# Patient Record
Sex: Male | Born: 1935 | Race: White | Hispanic: No | Marital: Married | Smoking: Never smoker
Health system: Southern US, Community
[De-identification: ages and names within clinical notes are randomized; demographics above are authoritative.]

## PROBLEM LIST (undated history)

## (undated) DIAGNOSIS — I10 Essential (primary) hypertension: Secondary | ICD-10-CM

## (undated) DIAGNOSIS — H3320 Serous retinal detachment, unspecified eye: Secondary | ICD-10-CM

## (undated) DIAGNOSIS — N200 Calculus of kidney: Secondary | ICD-10-CM

## (undated) DIAGNOSIS — G459 Transient cerebral ischemic attack, unspecified: Secondary | ICD-10-CM

## (undated) DIAGNOSIS — I4891 Unspecified atrial fibrillation: Secondary | ICD-10-CM

## (undated) DIAGNOSIS — I340 Nonrheumatic mitral (valve) insufficiency: Secondary | ICD-10-CM

## (undated) DIAGNOSIS — K219 Gastro-esophageal reflux disease without esophagitis: Secondary | ICD-10-CM

## (undated) DIAGNOSIS — E739 Lactose intolerance, unspecified: Secondary | ICD-10-CM

## (undated) DIAGNOSIS — K5790 Diverticulosis of intestine, part unspecified, without perforation or abscess without bleeding: Secondary | ICD-10-CM

## (undated) DIAGNOSIS — M4644 Discitis, unspecified, thoracic region: Secondary | ICD-10-CM

## (undated) HISTORY — DX: Gastro-esophageal reflux disease without esophagitis: K21.9

## (undated) HISTORY — DX: Nonrheumatic mitral (valve) insufficiency: I34.0

## (undated) HISTORY — DX: Calculus of kidney: N20.0

## (undated) HISTORY — PX: ESOPHAGEAL DILATION: SHX303

## (undated) HISTORY — PX: OTHER SURGICAL HISTORY: SHX169

## (undated) HISTORY — DX: Serous retinal detachment, unspecified eye: H33.20

## (undated) HISTORY — PX: INGUINAL HERNIA REPAIR: SHX194

## (undated) HISTORY — PX: KIDNEY STONE SURGERY: SHX686

## (undated) HISTORY — PX: TONSILLECTOMY: SHX5217

## (undated) HISTORY — PX: FINGER TENDON REPAIR: SHX1640

## (undated) HISTORY — DX: Unspecified atrial fibrillation: I48.91

## (undated) HISTORY — DX: Diverticulosis of intestine, part unspecified, without perforation or abscess without bleeding: K57.90

## (undated) HISTORY — DX: Gilbert syndrome: E80.4

## (undated) HISTORY — DX: Essential (primary) hypertension: I10

---
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
## 1998-11-27 ENCOUNTER — Emergency Department (HOSPITAL_COMMUNITY): Admission: EM | Admit: 1998-11-27 | Discharge: 1998-11-27 | Payer: Self-pay | Admitting: Emergency Medicine

## 1998-11-27 ENCOUNTER — Encounter: Payer: Self-pay | Admitting: Emergency Medicine

## 2000-03-08 ENCOUNTER — Encounter: Payer: Self-pay | Admitting: Surgery

## 2000-03-10 ENCOUNTER — Observation Stay (HOSPITAL_COMMUNITY): Admission: RE | Admit: 2000-03-10 | Discharge: 2000-03-11 | Payer: Self-pay | Admitting: Surgery

## 2000-06-26 ENCOUNTER — Ambulatory Visit (HOSPITAL_COMMUNITY): Admission: RE | Admit: 2000-06-26 | Discharge: 2000-06-27 | Payer: Self-pay | Admitting: Ophthalmology

## 2001-06-16 ENCOUNTER — Emergency Department (HOSPITAL_COMMUNITY): Admission: EM | Admit: 2001-06-16 | Discharge: 2001-06-16 | Payer: Self-pay | Admitting: Emergency Medicine

## 2001-06-16 ENCOUNTER — Encounter: Payer: Self-pay | Admitting: Emergency Medicine

## 2002-06-04 ENCOUNTER — Encounter: Admission: RE | Admit: 2002-06-04 | Discharge: 2002-06-04 | Payer: Self-pay | Admitting: Internal Medicine

## 2002-06-04 ENCOUNTER — Encounter: Payer: Self-pay | Admitting: Internal Medicine

## 2003-01-03 ENCOUNTER — Encounter: Payer: Self-pay | Admitting: Gastroenterology

## 2005-01-18 ENCOUNTER — Ambulatory Visit: Payer: Self-pay | Admitting: Gastroenterology

## 2005-02-10 ENCOUNTER — Ambulatory Visit: Payer: Self-pay | Admitting: Gastroenterology

## 2005-02-10 ENCOUNTER — Encounter (INDEPENDENT_AMBULATORY_CARE_PROVIDER_SITE_OTHER): Payer: Self-pay | Admitting: *Deleted

## 2005-05-27 ENCOUNTER — Ambulatory Visit (HOSPITAL_COMMUNITY): Admission: RE | Admit: 2005-05-27 | Discharge: 2005-05-28 | Payer: Self-pay | Admitting: Surgery

## 2009-01-05 ENCOUNTER — Encounter: Admission: RE | Admit: 2009-01-05 | Discharge: 2009-01-05 | Payer: Self-pay | Admitting: Internal Medicine

## 2009-02-25 DIAGNOSIS — J45909 Unspecified asthma, uncomplicated: Secondary | ICD-10-CM | POA: Insufficient documentation

## 2009-02-25 DIAGNOSIS — M5410 Radiculopathy, site unspecified: Secondary | ICD-10-CM | POA: Insufficient documentation

## 2009-02-25 DIAGNOSIS — N529 Male erectile dysfunction, unspecified: Secondary | ICD-10-CM | POA: Insufficient documentation

## 2009-03-02 ENCOUNTER — Encounter: Admission: RE | Admit: 2009-03-02 | Discharge: 2009-03-02 | Payer: Self-pay | Admitting: Orthopedic Surgery

## 2009-03-04 ENCOUNTER — Ambulatory Visit (HOSPITAL_BASED_OUTPATIENT_CLINIC_OR_DEPARTMENT_OTHER): Admission: RE | Admit: 2009-03-04 | Discharge: 2009-03-04 | Payer: Self-pay | Admitting: Orthopedic Surgery

## 2010-02-16 ENCOUNTER — Encounter: Payer: Self-pay | Admitting: Gastroenterology

## 2010-03-15 DIAGNOSIS — R001 Bradycardia, unspecified: Secondary | ICD-10-CM | POA: Insufficient documentation

## 2010-04-06 ENCOUNTER — Ambulatory Visit: Payer: Self-pay | Admitting: Cardiology

## 2010-04-08 NOTE — Letter (Signed)
Summary: Colonoscopy Letter  [facility]  [address]   [place], [place] [address]   Phone: [phone]  Fax: [phone]      February 16, 2010 MRN: [id number]   [facility] [address] [place], [place]  [address]   Dear Cody Mendoza,   According to your medical record, it is time for you to schedule a Colonoscopy. The American Cancer Society recommends this procedure as a method to detect early colon cancer. Patients with a family history of colon cancer, or a personal history of colon polyps or inflammatory bowel disease are at increased risk.  This letter has been generated based on the recommendations made at the time of your procedure. If you feel that in your particular situation this may no longer apply, please contact our office.  Please call our office at [phone] to schedule this appointment or to update your records at your earliest convenience.  Thank you for cooperating with [place] to provide you with the very best care possible.   Sincerely,   Claudette Head, M.D.  [facility] [phone]

## 2010-04-16 ENCOUNTER — Ambulatory Visit (INDEPENDENT_AMBULATORY_CARE_PROVIDER_SITE_OTHER): Payer: BC Managed Care – PPO | Admitting: *Deleted

## 2010-04-16 DIAGNOSIS — I498 Other specified cardiac arrhythmias: Secondary | ICD-10-CM

## 2010-04-16 DIAGNOSIS — I10 Essential (primary) hypertension: Secondary | ICD-10-CM

## 2010-04-22 ENCOUNTER — Encounter: Payer: Self-pay | Admitting: Gastroenterology

## 2010-04-22 ENCOUNTER — Ambulatory Visit (INDEPENDENT_AMBULATORY_CARE_PROVIDER_SITE_OTHER): Payer: BC Managed Care – PPO | Admitting: Gastroenterology

## 2010-04-22 DIAGNOSIS — Z01810 Encounter for preprocedural cardiovascular examination: Secondary | ICD-10-CM

## 2010-04-22 DIAGNOSIS — K219 Gastro-esophageal reflux disease without esophagitis: Secondary | ICD-10-CM

## 2010-04-22 DIAGNOSIS — Z860100 Personal history of colon polyps, unspecified: Secondary | ICD-10-CM | POA: Insufficient documentation

## 2010-04-22 NOTE — Assessment & Plan Note (Signed)
Summary: Gastroenterology  Cody Mendoza MR#:  [id number] Page #  NAME:  Cody Mendoza, Cody Mendoza  OFFICE NO:  [id number]  DATE:  01/18/05  DOB:  October 05, 2021  HISTORY OF PRESENT ILLNESS:  The patient is a very nice 77 year old white male who I follow for gastroesophageal reflux disease with a history of a peptic stricture.  Upper endoscopy with [name] dilation in October 2004.  He is maintained on Protonix, and his reflux symptoms are under good control.  He has no dysphagia.  He last had colorectal cancer screening with a flexible sigmoidoscopy about 10 years ago, and he was told of internal hemorrhoids at that time.  There is no family history of colon cancer or colon polyps.  He denies any abdominal pain, change in bowel habits, constipation, diarrhea, melena, hematochezia, or change in stool caliber.  His appetite is good, and his weight is stable.  His reflux symptoms are under very good control.    CURRENT MEDICATIONS:  Listed on the chart, updated and reviewed.  MEDICATION ALLERGIES:  None known.   PHYSICAL EXAMINATION:  No acute distress.  Weight 149.2 pounds.  Blood pressure is 154/76.  Pulse 68 and regular.  HEENT exam:  Anicteric sclerae; oropharynx clear.  Chest:  Clear to auscultation bilaterally.  Cardiac:  Regular rate and rhythm without murmurs.  Abdomen:  Soft, nontender and nondistended with normoactive bowel sounds; no palpable organomegaly, masses or hernias.  Rectal examination deferred to time of colonoscopy.  Extremities without clubbing, cyanosis, or edema.  Neurologic:  Alert and oriented x3, grossly nonfocal.    ASSESSMENT AND PLAN:   1.  Average risk for colorectal cancer.  Risks, benefits, and alternatives to colonoscopy with possible biopsy and possible polypectomy discussed with the patient.  He consents to proceed.  This will be scheduled electively.  2.  Gastroesophageal reflux disease with a history of peptic stricture.  Maintain all standard antireflux measures and a proton  pump inhibitor on a daily basis.  He will remain on Prilosec 40 mg p.o. q.a.m. for now.       Venita Lick. Russella Dar, M.D., F.A.C.G.  [id number] cc:  Rodrigo Ran, MD  D:  01/23/05; T:  ; Job [phone]

## 2010-04-22 NOTE — Procedures (Signed)
Summary: EGD   EGD  Procedure date:  01/03/2003  Findings:      Findings: Stricture:  Location: [facility]   Patient Name: Cody Mendoza, Cody Mendoza MRN:  Procedure Procedures: Panendoscopy (EGD) CPT: 43235.    with biopsy(s)/brushing(s). CPT: D1846139.    with [facility] dilation [id number] Personnel: Endoscopist: Venita Lick. Russella Dar, MD, Clementeen Graham.  Referred By: Rodrigo Ran, MD.  Exam Location: Exam performed in Outpatient Clinic. Outpatient  Patient Consent: Procedure, Alternatives, Risks and Benefits discussed, consent obtained, from patient. Consent was obtained by the RN.  Indications Symptoms: Dysphagia. Reflux symptoms  History  Current Medications: Patient is not currently taking Coumadin.  Pre-Exam Physical: Performed Jan 03, 2003  Entire physical exam was normal.  Exam Exam Info: Maximum depth of insertion Duodenum, intended Duodenum. Vocal cords not visualized. Gastric retroflexion performed. ASA Classification: II. Tolerance: excellent.  Sedation Meds: Patient assessed and found to be appropriate for moderate (conscious) sedation. Fentanyl 50 mcg. given IV. Versed 5 mg. given IV. Cetacaine Spray 2 sprays given aerosolized.  Monitoring: BP and pulse monitoring done. Oximetry used. Supplemental O2 given  Findings Normal: Proximal Esophagus to Mid Esophagus.  STRICTURE / STENOSIS: Stricture in Distal Esophagus.  Constriction: partial. Lumen diameter is 14 mm. Biopsy of Stricture/Steno  taken. ICD9: Esophageal Stricture: 530.3.  - Dilation: Distal Esophagus. Maloney dilator used, Diameter: 15 mm, Minimal Resistance, No Heme present on extraction. Maloney dilator used, Diameter: 16 mm, Minimal Resistance, No Heme present on extraction. Patient tolerance excellent. Outcome: successful.  Normal: Cardia to Duodenal 2nd Portion.   Assessment  Diagnoses: 530.3: Esophageal Stricture.   Events  Unplanned Intervention: No unplanned interventions were  required.  Unplanned Events: There were no complications. Plans Instructions: Nothing to eat or drink for 1 hour.  Clear or full liquids: 2 hours.  Medication(s): Await pathology. Continue current medications. PPI: Pantoprazole/Protonix 40 mg QAM, for indefinitely.   Patient Education: Patient given standard instructions for: Reflux. Stenosis / Stricture.  Disposition: After procedure patient sent to recovery. After recovery patient sent home.  Scheduling: Office Visit, to [employer]. Russella Dar, MD, [facility], around Jan 03, 2004.  Primary Care Provider, to Rodrigo Ran, MD,    This report was created from the original endoscopy report, which was reviewed and signed by the above listed endoscopist.    cc: Rodrigo Ran, MD

## 2010-04-22 NOTE — Procedures (Signed)
Summary: Colonoscopy and biopsy   Colonoscopy  Procedure date:  02/10/2005  Findings:      Results: Diverticulosis.       Pathology:  Adenomatous polyp.        Location:  [facility].    Procedures Next Due Date:    Colonoscopy: 02/2010 Patient Name: Cody, Mendoza MRN:  Procedure Procedures: Colonoscopy CPT: [id number].    with Hot Biopsy(s)CPT: Z451292.  Personnel: Endoscopist: Venita Lick. Russella Dar, MD, Clementeen Graham.  Exam Location: Exam performed in Outpatient Clinic. Outpatient  Patient Consent: Procedure, Alternatives, Risks and Benefits discussed, consent obtained, from patient. Consent was obtained by the RN.  Indications  Average Risk Screening Routine.  History  Current Medications: Patient is not currently taking Coumadin.  Pre-Exam Physical: Performed Feb 10, 2005. Cardio-pulmonary exam, Rectal exam, HEENT exam , Abdominal exam, Mental status exam WNL.  Exam Exam: Extent of exam reached: Cecum, extent intended: Cecum.  The cecum was identified by appendiceal orifice and IC valve. Colon retroflexion performed. ASA Classification: II. Tolerance: excellent.  Monitoring: Pulse and BP monitoring, Oximetry used. Supplemental O2 given.  Colon Prep Used MoviPrep for colon prep. Prep results: excellent.  Sedation Meds: Patient assessed and found to be appropriate for moderate (conscious) sedation. Fentanyl 50 mcg. given IV. Versed 5 mg. given IV.  Findings NORMAL EXAM: Ascending Colon to Splenic Flexure.  POLYP: Descending Colon, Maximum size: 3 mm. sessile polyp. Procedure:  hot biopsy, removed, retrieved, Polyp sent to pathology. ICD9: Colon Polyps: 211.3.  POLYP: Cecum, Maximum size: 3 mm. sessile polyp. Procedure:  hot biopsy, removed, retrieved, sent to pathology. ICD9: Colon Polyps: 211.3.  DIVERTICULOSIS: Sigmoid Colon. Not bleeding. ICD9: Diverticulosis: 562.10. Comments: mild.  NORMAL EXAM: Rectum.   Assessment  Diagnoses: 211.3: Colon  Polyps.  562.10: Diverticulosis.   Events  Unplanned Interventions: No intervention was required.  Unplanned Events: There were no complications. Plans  Post Exam Instructions: Post sedation instructions given. No aspirin or non-steroidal containing medications: 2 weeks.  Medication Plan: Await pathology.  Patient Education: Patient given standard instructions for: Polyps. Diverticulosis.  Disposition: After procedure patient sent to recovery. After recovery patient sent home.  Scheduling/Referral: Primary Care Provider, to Rodrigo Ran, MD, as planned,  Colonoscopy, to [facility] T. Russella Dar, MD, [facility], if polyp(s) adenomatous and health status appropriate, around Feb 10, 2010.    This report was created from the original endoscopy report, which was reviewed and signed by the above listed endoscopist.    cc: Rodrigo Ran, MD      SP Surgical Pathology - STATUS: Final             By: Gwenlyn Saran  ,        Perform Date: 7 Dec06 00:01  Ordered By: Rica Records Date: 8 Dec06 16:33  Facility: [facility]                               Department: CPATH  Service Report Text  [facility]   [address] [address]   [place], [place] [address]   Telephone [phone] or [phone] Fax [phone]    REPORT OF SURGICAL PATHOLOGY    Case #: [id number]   Patient Name: Cody, Mendoza   PID: [id number]   Pathologist: Berneta Levins, MD   DOB/Age 03-Aug-1921 (Age: 77) Gender: M   Date Taken: 02/10/2005  Date Received: 02/11/2005    FINAL DIAGNOSIS    ***MICROSCOPIC EXAMINATION AND DIAGNOSIS***    COLON, POLYP(S): TWO ADENOMATOUS POLYPS AND ONE POLYPOID   FRAGMENT OF COLONIC MUCOSA. NO HIGH GRADE DYSPLASIA OR INVASIVE   MALIGNANCY IDENTIFIED. (BIOPSIES, CECUM AND DESCENDING)    COMMENT   There is adenoma with nuclear stratification and decreased mucin.   High grade dysplasia or invasive malignancy are not identified.   The  findings are consistent with an adenomatous polyp(s) if the   biopsy(ies) is (are) representative of the entire lesion(s).   These features are present in two fragments of tissue. (JAS:caf   02/14/05)    cf   Date Reported: 02/14/2005 Berneta Levins, MD   *** Electronically Signed Out By JAS ***    Clinical information   R/O adenoma (kv)    specimen(s) obtained   Colon, polyp(s), cecal, descending    Gross Description   Received in formalin are tan, soft tissue fragments that are   submitted in toto. Number: 3   Size: 0.2, 0.3 and 0.4 cm toto in cassette.   (BM:jes, 02/14/05)    jes/

## 2010-04-28 ENCOUNTER — Other Ambulatory Visit: Payer: Self-pay | Admitting: Gastroenterology

## 2010-04-28 ENCOUNTER — Other Ambulatory Visit (AMBULATORY_SURGERY_CENTER): Payer: BC Managed Care – PPO | Admitting: Gastroenterology

## 2010-04-28 DIAGNOSIS — Z1211 Encounter for screening for malignant neoplasm of colon: Secondary | ICD-10-CM

## 2010-04-28 DIAGNOSIS — D126 Benign neoplasm of colon, unspecified: Secondary | ICD-10-CM

## 2010-04-28 DIAGNOSIS — Z86010 Personal history of colonic polyps: Secondary | ICD-10-CM

## 2010-04-28 DIAGNOSIS — K5730 Diverticulosis of large intestine without perforation or abscess without bleeding: Secondary | ICD-10-CM

## 2010-04-28 NOTE — Assessment & Plan Note (Signed)
Summary: CONSULT FOR COLON--[facility] WITH PT MEDICARE CX POLICY ADVISED   History of Present Illness Visit Type: Initial Visit Primary GI MD: Elie Goody MD [facility] Primary Provider: Emiliano Dyer Requesting Provider: n/a Chief Complaint: Consult for colonoscopy, Pt is not having any GI complaints History of Present Illness:   This is an 77 year old male with a history of GERD complicated by a peptic stricture and adenomatous colon polyps. His reflux symptoms are under very good control on Protonix. His health status is good. He had a recent complete physical examination by Dr. Waynard Edwards. He has stable hypertension, GERD and a history of kidney stones.   GI Review of Systems      Denies abdominal pain, acid reflux, belching, bloating, chest pain, dysphagia with liquids, dysphagia with solids, heartburn, loss of appetite, nausea, vomiting, vomiting blood, weight loss, and  weight gain.        Denies anal fissure, black tarry stools, change in bowel habit, constipation, diarrhea, diverticulosis, fecal incontinence, heme positive stool, hemorrhoids, irritable bowel syndrome, jaundice, light color stool, liver problems, rectal bleeding, and  rectal pain. Preventive Screening-Counseling & Management  Alcohol-Tobacco     Smoking Status: never      Drug Use:  yes.    Current Medications (verified): 1)  Bystolic 10 Mg Tabs (Nebivolol Hcl) .Marland Kitchen.. 1 By Mouth Once Daily 2)  Hydrochlorothiazide 25 Mg Tabs (Hydrochlorothiazide) .Marland Kitchen.. 1 By Mouth Once Daily 3)  Aspirin 81 Mg Tbec (Aspirin) .Marland Kitchen.. 1 By Mouth Once Daily 4)  Vitamin C 500 Mg Tabs (Ascorbic Acid) .Marland Kitchen.. 1 By Mouth Once Daily 5)  Vitamin D 1000 Unit Tabs (Cholecalciferol) .Marland Kitchen.. 1 By Mouth Once Daily 6)  Multivitamins  Tabs (Multiple Vitamin) .Marland Kitchen.. 1 By Mouth Once Daily 7)  Protonix 40 Mg Tbec (Pantoprazole Sodium) .Marland Kitchen.. 1 By Mouth Once Daily 8)  Doxazosin Mesylate 4 Mg Tabs (Doxazosin Mesylate) .Marland Kitchen.. 1 By Mouth Once Daily 9)  Sea-Omega 30 1200  Mg Caps (Omega-3 Fatty Acids) .... 2 By Mouth Once Daily 10)  Vitamin B-12 250 Mcg Tabs (Cyanocobalamin) .Marland Kitchen.. 1 By Mouth Once Daily 11)  Viagra 100 Mg Tabs (Sildenafil Citrate) .... As Needed  Allergies (verified): No Known Drug Allergies  Past History:  Past Medical History: Last updated: 04/16/2010 GERD Peptic Stricture Hemorrhoids Hypertension Kidney Stones Diverticulosis Adenomatous Colon Polyps 02/2005  Past Surgical History: Last updated: 04/16/2010 Inguinal hernia repair T & A Repair of torn retina 2002  Family History: No FH of Colon Cancer: Multilpe Myoloma- Mother Family History of Breast Cancer: sister Family History of Prostate Cancer: father lung cancer-brother  Social History: Married Alcohol Use - yes Occupation: retired Patient has never smoked.  Alcohol Use - no Illicit Drug Use - yes Smoking Status:  never Drug Use:  yes  Review of Systems       The patient complains of hearing problems.         The pertinent positives and negatives are noted as above and in the HPI. All other ROS were reviewed and were negative.   Vital Signs:  Patient profile:   77 year old male Height:      67 inches Weight:      156 pounds BMI:     24.52 BSA:     1.82 Pulse rate:   48 / minute Pulse rhythm:   regular BP sitting:   152 / 68  (left arm)  Vitals Entered By: Merri Ray CMA Duncan Dull) (April 22, 2010 10:43 AM)  Physical Exam  General:  Well developed, well nourished, no acute distress. Head:  Normocephalic and atraumatic. Eyes:  PERRLA, no icterus. Ears:  decreased auditory acuity.   Mouth:  No deformity or lesions, dentition normal. Lungs:  Clear throughout to auscultation. Heart:  Regular rate and rhythm; no murmurs, rubs,  or bruits. Abdomen:  Soft, nontender and nondistended. No masses, hepatosplenomegaly or hernias noted. Normal bowel sounds. Rectal:  deferred until time of colonoscopy.   Extremities:  No clubbing, cyanosis, edema or  deformities noted. Neurologic:  Alert and  oriented x4;  grossly normal neurologically. Cervical Nodes:  No significant cervical adenopathy. Inguinal Nodes:  No significant inguinal adenopathy. Psych:  Alert and cooperative. Normal mood and affect.  Impression & Recommendations:  Problem # 1:  PERSONAL HX COLONIC POLYPS (ICD-V12.72) Prior history of adenomatous colon polyps. His health status is good and he would like to proceed with colonoscopy, which I feel is reasonable. The risks, benefits and alternatives to colonoscopy with possible biopsy and possible polypectomy were discussed with the patient and they consent to proceed. The procedure will be scheduled electively. Orders: Colonoscopy (Colon)  Problem # 2:  GERD (ICD-530.81) Continue Protonix 40 mg q.a.m., along with standard antireflux measures.  Patient Instructions: 1)  Pick up your prep from your pharmacy.  2)  Colonoscopy brochure given.  3)  Copy sent to : Loraine Leriche Perini,MD 4)  The medication list was reviewed and reconciled.  All changed / newly prescribed medications were explained.  A complete medication list was provided to the patient / caregiver.  Prescriptions: MOVIPREP 100 GM  SOLR (PEG-KCL-NACL-NASULF-NA ASC-C) As per prep instructions.  #1 x 0   Entered by:   Christie Nottingham CMA (AAMA)   Authorized by:   Meryl Dare MD [facility]   Signed by:   Christie Nottingham CMA (AAMA) on 04/22/2010   Method used:   Electronically to        [facility]. [phone]* (retail)       [address]       [place], [place]  [address]       Ph: [phone]       Fax: [phone]   RxID:   [id number]

## 2010-04-28 NOTE — Letter (Signed)
Summary: [facility] Instructions  [facility]  [address] [place], [place] [address]   Phone: [phone]  Fax: [phone]       Cody Mendoza    [birth year]    MRN: [id number]        Procedure Day Dorna Bloom: Wednesday February 22nd, 2012     Arrival Time: 12:30pm     Procedure Time: 1:30pm     Location of Procedure:                    _ x_  [facility] (4th Floor)                        PREPARATION FOR COLONOSCOPY WITH MOVIPREP   Starting 5 days prior to your procedure 04/23/10 do not eat nuts, seeds, popcorn, corn, beans, peas,  salads, or any raw vegetables.  Do not take any fiber supplements (e.g. Metamucil, Citrucel, and Benefiber).  THE DAY BEFORE YOUR PROCEDURE         DATE: 04/27/10  DAY: Tuesday  1.  Drink clear liquids the entire day-NO SOLID FOOD  2.  Do not drink anything colored red or purple.  Avoid juices with pulp.  No orange juice.  3.  Drink at least 64 oz. (8 glasses) of fluid/clear liquids during the day to prevent dehydration and help the prep work efficiently.  CLEAR LIQUIDS INCLUDE: Water Jello Ice Popsicles Tea (sugar ok, no milk/cream) Powdered fruit flavored drinks Coffee (sugar ok, no milk/cream) Gatorade Juice: apple, white grape, white cranberry  Lemonade Clear bullion, consomm, broth Carbonated beverages (any kind) Strained chicken noodle soup Hard Candy                             4.  In the morning, mix first dose of MoviPrep solution:    Empty 1 Pouch A and 1 Pouch B into the disposable container    Add lukewarm drinking water to the top line of the container. Mix to dissolve    Refrigerate (mixed solution should be used within 24 hrs)  5.  Begin drinking the prep at 5:00 p.m. The MoviPrep container is divided by 4 marks.   Every 15 minutes drink the solution down to the next mark (approximately 8 oz) until the full liter is complete.   6.  Follow completed prep with 16 oz of clear liquid of your  choice (Nothing red or purple).  Continue to drink clear liquids until bedtime.  7.  Before going to bed, mix second dose of MoviPrep solution:    Empty 1 Pouch A and 1 Pouch B into the disposable container    Add lukewarm drinking water to the top line of the container. Mix to dissolve    Refrigerate  THE DAY OF YOUR PROCEDURE      DATE:04/28/10 DAY: Wednesday  Beginning at 8:30 a.m. (5 hours before procedure):         1. Every 15 minutes, drink the solution down to the next mark (approx 8 oz) until the full liter is complete.  2. Follow completed prep with 16 oz. of clear liquid of your choice.    3. You may drink clear liquids until 11:30am (2 HOURS BEFORE PROCEDURE).   MEDICATION INSTRUCTIONS  Unless otherwise instructed, you should take regular prescription medications with a small sip of water   as early as possible the morning of your procedure.  OTHER INSTRUCTIONS  You will need a responsible adult at least 75 years of age to accompany you and drive you home.   This person must remain in the waiting room during your procedure.  Wear loose fitting clothing that is easily removed.  Leave jewelry and other valuables at home.  However, you may wish to bring a book to read or  an iPod/MP3 player to listen to music as you wait for your procedure to start.  Remove all body piercing jewelry and leave at home.  Total time from sign-in until discharge is approximately 2-3 hours.  You should go home directly after your procedure and rest.  You can resume normal activities the  day after your procedure.  The day of your procedure you should not:   Drive   Make legal decisions   Operate machinery   Drink alcohol   Return to work  You will receive specific instructions about eating, activities and medications before you leave.    The above instructions have been reviewed and explained to me by   Estill Bamberg.     I fully understand and can verbalize these  instructions _____________________________ Date _________

## 2010-05-03 ENCOUNTER — Encounter: Payer: Self-pay | Admitting: Gastroenterology

## 2010-05-04 NOTE — Procedures (Addendum)
Summary: Colonoscopy  Patient: Cody Mendoza Note: All result statuses are Final unless otherwise noted.  Tests: (1) Colonoscopy (COL)   COL Colonoscopy           DONE     [facility]     [address]     [place], [place]  [address]           COLONOSCOPY PROCEDURE REPORT     PATIENT:  Cody Mendoza, Cody Mendoza  MR#:  [id number]     BIRTHDATE:  Aug 20, 1921, 88 yrs. old  GENDER:  male     ENDOSCOPIST:  Judie Petit T. Russella Dar, MD, [facility]           PROCEDURE DATE:  04/28/2010     PROCEDURE:  Colonoscopy with snare polypectomy     ASA CLASS:  Class II     INDICATIONS:  1) surveillance and high-risk screening  2) history     of pre-cancerous (adenomatous) colon polyps: 02/2005.     MEDICATIONS:   Fentanyl 50 mcg IV, Versed 5 mg IV     DESCRIPTION OF PROCEDURE:   After the risks benefits and     alternatives of the procedure were thoroughly explained, informed     consent was obtained.  Digital rectal exam was performed and     revealed no abnormalities.   The LB [id number]-[id number] endoscope     was introduced through the anus and advanced to the cecum, which     was identified by both the appendix and ileocecal valve, limited     by a tortuous colon.  The quality of the prep was excellent, using     MoviPrep.  The instrument was then slowly withdrawn as the colon     was fully examined.     <<PROCEDUREIMAGES>>     FINDINGS:  A sessile polyp was found in the mid transverse colon.     It was 4 mm in size. Polyp was snared without cautery. Retrieval     was successful. snare polyp  Mild diverticulosis was found in the     sigmoid colon. A normal appearing cecum, ileocecal valve, and     appendiceal orifice were identified. The ascending, hepatic     flexure, splenic flexure, descending colon, and rectum appeared     unremarkable. Retroflexed views in the rectum revealed no     abnormalities. The time to cecum =  6.25  minutes. The scope was     then withdrawn (time =  9.75  min) from the  patient and the     procedure completed.           COMPLICATIONS:  None           ENDOSCOPIC IMPRESSION:     1) 4 mm sessile polyp in the mid transverse colon     2) Mild diverticulosis in the sigmoid colon           RECOMMENDATIONS:     1) Await pathology results     2) High fiber diet with liberal fluid intake.     3) Given your age, we will not plan for another colonoscopy for     colon cancer screening or polyp surveillance.           Venita Lick. Russella Dar, MD, [facility]           CC:  Rodrigo Ran, MD           n.     Rosalie DoctorVenita Lick.  Tennis Mckinnon at 04/28/2010 01:49 PM           Cody Mendoza, Cody Mendoza, [id number]  Note: An exclamation mark (!) indicates a result that was not dispersed into the flowsheet. Document Creation Date: 04/28/2010 1:49 PM _______________________________________________________________________  (1) Order result status: Final Collection or observation date-time: 04/28/2010 13:43 Requested date-time:  Receipt date-time:  Reported date-time:  Referring Physician:   Ordering Physician: Claudette Head [id number]) Specimen Source:  Source: Launa Grill Order Number: [id number] Lab site:

## 2010-05-13 NOTE — Letter (Signed)
Summary: Patient Notice- Polyp Results  [facility]  [address] [place], [place] [address]   Phone: [phone]  Fax: [phone]        May 03, 2010 MRN: [id number]    [facility] [address] [place], [place]  [address]    Dear Cody Mendoza,  I am pleased to inform you that the colon polyp(s) removed during your recent colonoscopy was (were) found to be benign (no cancer detected) upon pathologic examination.  Should you develop new or worsening symptoms of abdominal pain, bowel habit changes or bleeding from the rectum or bowels, please schedule an evaluation with either your primary care physician or with me.  Continue treatment plan as outlined the day of your exam.  Please call us if you are having persistent problems or have questions about your condition that have not been fully answered at this time.  Sincerely,  Meryl Dare MD [facility]  This letter has been electronically signed by your physician.  Appended Document: Patient Notice- Polyp Results letter mailed

## 2010-06-07 LAB — BASIC METABOLIC PANEL
BUN: 14 mg/dL (ref 6–23)
Chloride: 97 mEq/L (ref 96–112)
Creatinine, Ser: 1.16 mg/dL (ref 0.4–1.5)
Glucose, Bld: 104 mg/dL — ABNORMAL HIGH (ref 70–99)
Potassium: 3.7 mEq/L (ref 3.5–5.1)

## 2010-06-17 ENCOUNTER — Telehealth: Payer: Self-pay | Admitting: Cardiology

## 2010-06-17 NOTE — Telephone Encounter (Signed)
PATIENT CALLED AND SAID CHANGE IN BP MEDS, SAID BP IS 125/60 AVG.  BUT IS FEELING SLUGGISH AND NO ENERGY.  PLEASE CALL PATIENT.

## 2010-06-21 ENCOUNTER — Telehealth: Payer: Self-pay | Admitting: *Deleted

## 2010-06-21 DIAGNOSIS — I10 Essential (primary) hypertension: Secondary | ICD-10-CM

## 2010-06-21 MED ORDER — NEBIVOLOL HCL 10 MG PO TABS: 5.0000 mg | ORAL_TABLET | Freq: Every day | ORAL | Status: DC

## 2010-06-21 NOTE — Telephone Encounter (Signed)
Called last week re: low energy level since changing BP med to [employer].  BP has been 125/60; HR 50-60's. Per Dr. [place] may decrease Bystolic to 5 MG daily.

## 2010-07-02 ENCOUNTER — Telehealth: Payer: Self-pay | Admitting: Cardiology

## 2010-07-02 NOTE — Telephone Encounter (Signed)
He's continuing to be lethargic and has no energy at all. Started when he changed medication last. Though his blood pressure is doing really well 129/58. Wanted to know if there was anything that could be done about it. Please call back. I have pulled the chart.

## 2010-07-05 NOTE — Telephone Encounter (Signed)
Called Friday 4/17 and was unable to get back in touch w/ him. NA several times. Today he states he still has no energy. BP staying around 125-128/ 65; HR 56-50. Is able to do activities but has to push himself. He did reduce Bystolic to 5 mg. Offered him an app w/ Lawson Fiscal but he wants to give it another week. He will call back if needs appointment.

## 2010-07-22 DIAGNOSIS — E291 Testicular hypofunction: Secondary | ICD-10-CM | POA: Insufficient documentation

## 2010-07-23 NOTE — Op Note (Signed)
Cody Mendoza, COVIN            ACCOUNT NO.:  [id number]   MEDICAL RECORD NO.:  [id number]          PATIENT TYPE:  AMB   LOCATION:  DAY                          FACILITY:  [facility]   PHYSICIAN:  Velora Heckler, MD      DATE OF BIRTH:  07/24/1921   DATE OF PROCEDURE:  05/27/2005  DATE OF DISCHARGE:                                 OPERATIVE REPORT   PREOPERATIVE DIAGNOSIS:  Left inguinal hernia.   POSTOP DIAGNOSIS:  Left inguinal hernia.   PROCEDURE:  Repair left inguinal hernia with Ethicon ULTRAPRO mesh.   SURGEON:  Velora Heckler, MD, FACS   ANESTHESIA:  General.   ESTIMATED BLOOD LOSS:  Minimal.   PREPARATION:  Betadine.   COMPLICATIONS:  None.   INDICATIONS:  The patient is an 77 year old white male from [place],  [place].  He presents at the request of Dr. Rodrigo Ran for repair of  left inguinal hernia.  The patient had had a previous right inguinal  herniorrhaphy approximately 5 years ago.  Over the past few months he has  developed a bulge in the left groin.  This has caused him progressive  discomfort as it has increased in size.  He denies any intestinal  obstruction.  He now comes to surgery for repair.   BODY OF REPORT:  Procedure is done in OR #11 at the [facility]  The patient is brought to the operating room, and placed in the supine  position on the operating room table.  Following the administration of  general anesthesia, the patient is prepped and draped in the usual strict  aseptic fashion.  After ascertaining that an adequate level of anesthesia  had been obtained, a left inguinal incision is made with a #15 blade.  Dissection is carried down through subcutaneous tissues.  Fascia is incised  in the line of its fibers and extended through the external inguinal ring.  Cord structures were dissected out of the inguinal canal and encircled with  a Penrose drain.  Cord was explored.  There is a moderate-sized lipoma of  the cord which is  excised.  This is performed with high ligation between  hemostats, and suture ligated with 2-0 silk suture ligatures.  There is an  indirect inguinal hernia sac which is reduced back within the peritoneal  cavity.   The floor of the inguinal canal is then recreated with a sheet of Prolene  mesh.  Mesh is cut to the appropriate dimensions.  It is secured to the  pubic tubercle and along the inguinal ligament with a running 2-0 Novofil  suture.  Mesh is split to accommodate the cord structures.  The superior  margin of the mesh is secured to the transversalis and internal oblique  fascia with interrupted 2-0 Novofil sutures.  Tails of the mesh were  overlapped lateral to the cord structures, and secured to the inguinal  ligament with interrupted 2-0 Novofil sutures.  Field block is placed with  Marcaine.  Cord structures are returned to the inguinal canal.  External  oblique fascia is closed with interrupted 3-0 Vicryl sutures.  Subcutaneous  tissues are closed with interrupted 3-0 Vicryl sutures.  Skin is  anesthetized with local  anesthetic.  Skin is closed with a running 4-0 Vicryl subcuticular suture.  Wound is washed and dried and Benzoin, Steri-Strips are applied.  Sterile  dressings are applied.  The patient is awakened from anesthesia and brought  to the recovery room in stable condition.  The patient tolerated the  procedure well.      Velora Heckler, MD  Electronically Signed     TMG/MEDQ  D:  05/27/2005  T:  05/30/2005  Job:  [id number]   cc:   Loraine Leriche A. Perini, M.D.  Fax: [phone]

## 2010-07-23 NOTE — Op Note (Signed)
[facility]  Patient:    Cody Mendoza, Cody Mendoza                   MRN: [id number] Proc. Date: 03/10/00 Adm. Date:  [id number] Attending:  Bonnetta Barry CC:         Rodrigo Ran, M.D. at [facility]   Operative Report  PREOPERATIVE DIAGNOSIS:  Right inguinal hernia.  POSTOPERATIVE DIAGNOSIS:  Right inguinal hernia.  PROCEDURE:  Repair of right inguinal hernia with Prolene mesh.  SURGEON:  Velora Heckler, M.D.  ANESTHESIA:  General.  ESTIMATED BLOOD LOSS:  Minimal.  PREPARATION:  Betadine.  COMPLICATIONS:  None.  INDICATIONS:  The patient is a 77 year old white male who presents with right inguinal hernia.  He first noted a bulge around Thanksgiving 2001.  He was seen and evaluated by his primary care physician and referred for elective repair.  The patient has had no signs or symptoms of intestinal obstruction. The hernia has always been reducible.  He now comes to surgery for elective repair of right inguinal hernia with Prolene mesh.  DESCRIPTION OF PROCEDURE:  The procedure was done in OR #2 at the [facility]  The patient was brought to the operating room and placed in a supine position on the operating room table.  Following the administration of general anesthesia, the patient was prepped and draped in the usual strict aseptic fashion.  After ascertaining that an adequate level of anesthesia had been obtained, a right inguinal incision was made with a #10 blade.  Dissection was carried through the subcutaneous tissues and hemostasis obtained with the electrocautery.  External oblique fascia was incised in line with its fibers and extended through the external inguinal ring, taking care to avoid the ilioinguinal nerve.  Cord structures were encircled with a Penrose drain.  The floor of the inguinal canal was defined. There was a moderate-sized indirect inguinal hernia with a hernia sac and a moderate  lipoma of the cord.  The sac is dissected away from the cord structures up to the level of the internal inguinal ring.  A high ligation of the sac is performed with a 2-0 silk suture ligature.  The hernia sac is excised and discarded.  The lipoma of the cord is also dissected up to the level of the internal inguinal ring, and a high ligation is performed with a 2-0 silk suture ligature.  The lipoma is excised and discarded.  The floor of the inguinal canal is then recreated with a sheet of Prolene mesh.  The mesh was secured to the pubic tubercle and along the inguinal ligament with a running 2-0 Novofil suture.  The mesh was split to accommodate the cord structures.  The superior margin of the mesh was secured to the transversalis and internal oblique fascia with interrupted 2-0 Novofil sutures.  Tails of the mesh were overlapped and the inferior edges of the mesh tails were secured to the inguinal ligament with a single 2-0 Novofil suture placed lateral to the cord structures to recreate the internal inguinal ring.  Field block is placed with local Marcaine anesthetic.  Cord structures were returned to the inguinal canal.  The external oblique fascia was closed with interrupted 3-0 Vicryl sutures.  The subcutaneous tissues were reapproximated with interrupted 3-0 Vicryl sutures.  The skin was anesthetized with local anesthetic and the skin reapproximated with interrupted 4-0 Vicryl subcuticular sutures.  The wound was washed and dried, and Benzoin and Steri-Strips are applied.  Sterile gauze dressings are applied.  The patient was awakened from anesthesia and brought to the recovery room in stable condition.  The patient tolerated the procedure well. DD:  03/10/00 TD:  03/10/00 Job: [id number]

## 2010-07-23 NOTE — Op Note (Signed)
[place]  Patient:    Cody Mendoza, Cody Mendoza                   MRN: [id number] Proc. Date: 06/26/00 Adm. Date:  [id number] Disc. 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  PROCEDURE:  Scleral buckle with retinal cryopexy using a 287 solid silicone explant in the superotemporal quadrant.  SURGEON:  Ernesto Rutherford, M.D.  ANESTHESIA:  Local retrobulbar with monitored anesthesia control.  INDICATION FOR PROCEDURE:  The patient is a 77 year old man with profound vision loss on the basis of rhegmatogenous retinal detachment, visual field loss inferiorly.  This is an attempt to reattach the retina to prevent progression of vision loss.  The patient understands the risks of anesthesia, including the rare occurrence of death, and loss to the eye, including hemorrhage, infection, scarring, need for further surgery, no change in vision, loss of vision, and progression of disease despite intervention. After appropriate signed consent obtained, the patient was taken to the operating room.  DESCRIPTION OF PROCEDURE:  In the operating room, appropriate monitoring followed by mild sedation.  Marcaine 0.75% delivered 5 cc retrobulbar, additional 5 cc laterally in the fashion of modified Darel Hong.  Right periocular region was sterilely prepped and draped in the usual ophthalmic fashion.  A lid speculum applied.  Conjunctival peritomy was then fashioned in the superotemporal quadrant, extending from approximately the 8 oclock position to the superonasal quadrant approximately 2 oclock position.  The superior rectus and the lateral rectus muscles isolated on 2-0 silk ties and excellent exposure obtained.  Retinal cryopexy was  applied to retinal holes in approximately the 10:30 meridian.  Shallow retinal detachment was noted.  This extended from approximately the 9:30 to the 12 oclock position and extended slightly posterior to the equator.  At this time, the paracentesis was formed with 30-gauge needle to soften the globe further.  This allowed for excellent indentation using the 287 solid silicone explant, and 5-0 Mersilene mattress sutures x 2 were then used to secure it in place with excellent height and indentation obtained.  Excellent support was obtained.  There was no need to drain externally the subretinal fluid.  At this time, the bed and buckle were irrigated with bug juice.  Conjunctiva was closed with interrupted 7-0 Vicryl sutures.  Subconjunctival injection of antibiotics was applied.  The patient had a sterile patch and Fox shield applied and taken to the recovery room in good, stable condition, having tolerated the procedure well without complication.  Intraocular pressure had been assessed and found to be adequate. DD:  08/07/00 TD:  08/07/00 Job: [id number]

## 2010-07-23 NOTE — Op Note (Signed)
[facility]  Patient:    VERDIS, KOVAL                   MRN: [id number] Proc. Date: 03/10/00 Adm. Date:  [id number] Attending:  Bonnetta Barry                           Operative Report  PREOPERATIVE DIAGNOSIS:  Right inguinal hernia.  POSTOPERATIVE DIAGNOSIS:  Right inguinal hernia.  PROCEDURE:  Repair right inguinal hernia with Prolene mesh.  SURGEON:  Velora Heckler, M.D.  ANESTHESIA:  General.  ESTIMATED BLOOD LOSS:  Minimal.  PREPARATION:  Betadine.  COMPLICATIONS:  None.  INDICATIONS:  The patient is a 77 year old white male who presents with right inguinal hernia.  Her first noted a bulge around Thanksgiving 2001.  He was seen and evaluated by his primary care physician and referred for elective repair.  The patient has had no signs or symptoms of intestinal obstruction. The hernia has always been reducible.  He now comes to surgery for elective repair of right inguinal hernia with Prolene mesh. DD:  03/10/00 TD:  03/10/00 Job: [id number]

## 2010-10-19 ENCOUNTER — Emergency Department (HOSPITAL_COMMUNITY)
Admission: EM | Admit: 2010-10-19 | Discharge: 2010-10-19 | Disposition: A | Discharge status: Home / Self Care | Payer: Medicare Other | Attending: Emergency Medicine | Admitting: Emergency Medicine

## 2010-10-19 DIAGNOSIS — Z79899 Other long term (current) drug therapy: Secondary | ICD-10-CM | POA: Insufficient documentation

## 2010-10-19 DIAGNOSIS — Y92009 Unspecified place in unspecified non-institutional (private) residence as the place of occurrence of the external cause: Secondary | ICD-10-CM | POA: Insufficient documentation

## 2010-10-19 DIAGNOSIS — S51809A Unspecified open wound of unspecified forearm, initial encounter: Secondary | ICD-10-CM | POA: Insufficient documentation

## 2010-10-19 DIAGNOSIS — W260XXA Contact with knife, initial encounter: Secondary | ICD-10-CM | POA: Insufficient documentation

## 2010-10-19 DIAGNOSIS — I10 Essential (primary) hypertension: Secondary | ICD-10-CM | POA: Insufficient documentation

## 2010-11-02 ENCOUNTER — Encounter: Payer: Self-pay | Admitting: Cardiology

## 2010-11-04 ENCOUNTER — Encounter: Payer: Self-pay | Admitting: Cardiology

## 2010-11-16 ENCOUNTER — Ambulatory Visit (INDEPENDENT_AMBULATORY_CARE_PROVIDER_SITE_OTHER): Payer: Medicare Other | Admitting: Cardiology

## 2010-11-16 ENCOUNTER — Encounter: Payer: Self-pay | Admitting: Cardiology

## 2010-11-16 VITALS — BP 158/76 | HR 64 | Ht 67.0 in | Wt 151.6 lb

## 2010-11-16 DIAGNOSIS — I10 Essential (primary) hypertension: Secondary | ICD-10-CM

## 2010-11-16 DIAGNOSIS — I4891 Unspecified atrial fibrillation: Secondary | ICD-10-CM | POA: Insufficient documentation

## 2010-11-16 MED ORDER — DABIGATRAN ETEXILATE MESYLATE 150 MG PO CAPS: 150.0000 mg | ORAL_CAPSULE | Freq: Two times a day (BID) | ORAL | Status: DC

## 2010-11-16 NOTE — Assessment & Plan Note (Signed)
Atrial fibrillation is new compared to the ECG in Jan uary of this year. He is asymptomatic. His rate is controlled despite no rate slowing medications. He has [place] score of 2. I have recommended anticoagulation. After discussing options we recommended Pradaxa 150 mg twice daily. His prior renal function was normal. He had thyroid studies in January which were normal. We will schedule him for an echocardiogram. He'll followup again in one month.

## 2010-11-16 NOTE — Progress Notes (Signed)
[name] Date of Birth: 1921-07-17   History of Present Illness: Mr. Mays is seen today for followup with history of bradycardia and hypertension. Since his last visit he reports that his Bystolic was discontinued and he was started on losartan. Prior to this he states that he felt very bad with no energy and felt very lethargic. He is now feeling better. He denies any dizziness or syncope. He's had no palpitations. He denies any chest pain or shortness of breath.  Current Outpatient Prescriptions on File Prior to Visit  Medication Sig Dispense Refill  . Ascorbic Acid (VITAMIN C PO) Take 500 mg by mouth.        Marland Kitchen aspirin 81 MG tablet Take 81 mg by mouth daily.        . Cholecalciferol (VITAMIN D PO) Take 1,000 Units by mouth.        . doxazosin (CARDURA) 4 MG tablet Take 4 mg by mouth at bedtime.        . fish oil-omega-3 fatty acids 1000 MG capsule Take 2 g by mouth 2 (two) times daily.        . hydrochlorothiazide 25 MG tablet Take 25 mg by mouth. Taking 1/2 Pill Daily      . losartan (COZAAR) 50 MG tablet Take 50 mg by mouth. Taking 1/2 Pill Daily       . Multiple Vitamin (MULTIVITAMIN PO) Take by mouth.        . pantoprazole (PROTONIX) 40 MG tablet Take 40 mg by mouth daily.        . vitamin C (ASCORBIC ACID) 500 MG tablet Take 500 mg by mouth daily.        . nebivolol (BYSTOLIC) 10 MG tablet Take 0.5 tablets (5 mg total) by mouth daily.  15 tablet  11  . sildenafil (VIAGRA) 100 MG tablet Take 100 mg by mouth daily as needed.        Marland Kitchen DISCONTD: vitamin B-12 (CYANOCOBALAMIN) 250 MCG tablet Take 250 mcg by mouth. Taking 2 Times a Week         No Known Allergies  Past Medical History  Diagnosis Date  . Hypertension   . GERD (gastroesophageal reflux disease)   . Renal calculi   . Detached retina   . Asthma   . [name] syndrome   . Diverticulosis     Past Surgical History  Procedure Date  . Kidney stone surgery     Status post syrgical removal of a   .  Tonsillectomy   . Inguinal hernia repair   . Cataract surgery   . Finger tendon repair   . Esophageal dilation     In the past    History  Smoking status  . Never Smoker   Smokeless tobacco  . Not on file    History  Alcohol Use  . Yes    rarely    Family History  Problem Relation Age of Onset  . Hypertension Mother     Review of Systems: The review of systems is positive for low testosterone. He has been started on testosterone shots. He has been monitoring his blood pressure at home and since he was switched to losartan his blood pressure readings actually been quite acceptable anywhere from 116-145 systolic. All other systems were reviewed and are negative.  Physical Exam: BP 158/76  Pulse 64  Ht 5\' 7"  (1.702 m)  Wt 151 lb 9.6 oz (68.765 kg)  BMI 23.74 kg/m2 He is a pleasant  elderly white male in no acute distress. He is very hard of hearing. He is normocephalic, atraumatic. Pupils are equal round and reactive to light and accommodation. Sclera clear. Oropharynx is clear. Neck is supple no JVD, adenopathy, thyromegaly, or bruits. Lungs are clear. Cardiac exam reveals a normal rate with an irregular pulse he has no gallop, murmur, or click. Abdomen is soft and nontender without masses or bruits. Femoral and pedal pulses are 2+ and symmetric. Skin is warm and dry. He is alert and oriented x3. He has no focal findings. LABORATORY DATA: ECG today demonstrates atrial fibrillation with a controlled ventricular response at a rate of 73 beats per minute. It is otherwise normal.  Assessment / Plan:

## 2010-11-16 NOTE — Assessment & Plan Note (Signed)
Blood pressure is elevated here today but his readings at home have been quite acceptable. We will continue with his current antihypertensive therapy.

## 2010-11-16 NOTE — Patient Instructions (Signed)
We will start you on Pradaxa 150 mg twice a day.  STOP ASA.  We will schedule you for an echocardiogram.  I will see you again in 1 month.

## 2010-11-18 ENCOUNTER — Ambulatory Visit (HOSPITAL_COMMUNITY): Payer: Medicare Other | Attending: Cardiology | Admitting: Radiology

## 2010-11-18 ENCOUNTER — Encounter: Payer: Self-pay | Admitting: Cardiology

## 2010-11-18 DIAGNOSIS — I079 Rheumatic tricuspid valve disease, unspecified: Secondary | ICD-10-CM | POA: Insufficient documentation

## 2010-11-18 DIAGNOSIS — I10 Essential (primary) hypertension: Secondary | ICD-10-CM | POA: Insufficient documentation

## 2010-11-18 DIAGNOSIS — I059 Rheumatic mitral valve disease, unspecified: Secondary | ICD-10-CM | POA: Insufficient documentation

## 2010-11-18 DIAGNOSIS — I4891 Unspecified atrial fibrillation: Secondary | ICD-10-CM

## 2010-11-18 DIAGNOSIS — I379 Nonrheumatic pulmonary valve disorder, unspecified: Secondary | ICD-10-CM | POA: Insufficient documentation

## 2010-11-24 NOTE — Progress Notes (Signed)
lm

## 2010-11-29 ENCOUNTER — Telehealth: Payer: Self-pay | Admitting: Cardiology

## 2010-11-29 NOTE — Telephone Encounter (Signed)
Test results

## 2010-11-29 NOTE — Telephone Encounter (Signed)
lm

## 2010-11-30 ENCOUNTER — Telehealth: Payer: Self-pay | Admitting: *Deleted

## 2010-11-30 NOTE — Telephone Encounter (Signed)
Message copied by Lorayne Bender on Tue Nov 30, 2010 11:12 AM ------      Message from: [place], PETER M      Created: Fri Nov 19, 2010  8:32 AM       Normal LV function. Moderate MR. Moderate biatrial enlargement. This may make it difficult to restore NSR but he is currently asymptomatic.      Theron Arista [place]

## 2010-11-30 NOTE — Telephone Encounter (Signed)
Notified of echo results.

## 2010-12-28 ENCOUNTER — Encounter: Payer: Self-pay | Admitting: Cardiology

## 2010-12-28 ENCOUNTER — Ambulatory Visit (INDEPENDENT_AMBULATORY_CARE_PROVIDER_SITE_OTHER): Payer: Medicare Other | Admitting: Cardiology

## 2010-12-28 VITALS — BP 177/88 | HR 78 | Ht 67.0 in | Wt 155.4 lb

## 2010-12-28 DIAGNOSIS — I340 Nonrheumatic mitral (valve) insufficiency: Secondary | ICD-10-CM

## 2010-12-28 DIAGNOSIS — I4891 Unspecified atrial fibrillation: Secondary | ICD-10-CM

## 2010-12-28 DIAGNOSIS — I10 Essential (primary) hypertension: Secondary | ICD-10-CM

## 2010-12-28 DIAGNOSIS — I059 Rheumatic mitral valve disease, unspecified: Secondary | ICD-10-CM

## 2010-12-28 NOTE — Patient Instructions (Signed)
Continue your current medications.  Keep an eye on your blood pressure.  I will see you again in 6 months.

## 2010-12-28 NOTE — Assessment & Plan Note (Signed)
Blood pressure reading today is high but his readings at home have been quite acceptable and we did correlate his home blood pressure cuff today. He is going to continue to monitor his blood pressure readings at home.

## 2010-12-28 NOTE — Assessment & Plan Note (Signed)
Moderate by echocardiogram. He is on appropriate therapy with an ARB.

## 2010-12-28 NOTE — Progress Notes (Signed)
[name] Date of Birth: Mar 09, 1921   History of Present Illness: [name] is seen today for followup of his atrial fibrillation. He states he is feeling very well. He is had no significant shortness of breath, chest pain, or palpitations. He's had no dizziness. He's had no bleeding or bruising. He denies any TIA or CVA symptoms. He remains reactive. He reports that his average blood pressure reading is 133/73.  Current Outpatient Prescriptions on File Prior to Visit  Medication Sig Dispense Refill  . Ascorbic Acid (VITAMIN C PO) Take 500 mg by mouth.        . Cholecalciferol (VITAMIN D PO) Take 1,000 Units by mouth.        . Cyanocobalamin (VITAMIN B-12 PO) Take 1,000 Units by mouth. Taking 1/2 500 mcg Daily       . dabigatran (PRADAXA) 150 MG CAPS Take 1 capsule (150 mg total) by mouth every 12 (twelve) hours.  60 capsule  5  . doxazosin (CARDURA) 4 MG tablet Take 4 mg by mouth at bedtime.        . fish oil-omega-3 fatty acids 1000 MG capsule Take 2 g by mouth 2 (two) times daily.        . hydrochlorothiazide 25 MG tablet Take 25 mg by mouth. Taking 1/2 Pill Daily      . losartan (COZAAR) 50 MG tablet Take 50 mg by mouth. Taking 1/2 Pill Daily       . Multiple Vitamin (MULTIVITAMIN PO) Take by mouth.        . pantoprazole (PROTONIX) 40 MG tablet Take 40 mg by mouth daily.        . sildenafil (VIAGRA) 100 MG tablet Take 100 mg by mouth daily as needed.        . vitamin C (ASCORBIC ACID) 500 MG tablet Take 500 mg by mouth daily.          No Known Allergies  Past Medical History  Diagnosis Date  . Hypertension   . GERD (gastroesophageal reflux disease)   . Renal calculi   . Detached retina   . Asthma   . [name] syndrome   . Diverticulosis     Past Surgical History  Procedure Date  . Kidney stone surgery     Status post syrgical removal of a   . Tonsillectomy   . Inguinal hernia repair   . Cataract surgery   . Finger tendon repair   . Esophageal dilation       In the past    History  Smoking status  . Never Smoker   Smokeless tobacco  . Not on file    History  Alcohol Use  . Yes    rarely    Family History  Problem Relation Age of Onset  . Hypertension Mother     Review of Systems: As noted in history of present illness. All other systems were reviewed and are negative.  Physical Exam: BP 177/88  Pulse 78  Ht 5\' 7"  (1.702 m)  Wt 155 lb 6.4 oz (70.489 kg)  BMI 24.34 kg/m2 He is a pleasant elderly white male in no acute distress. He is very hard of hearing. He is normocephalic, atraumatic. Pupils are equal round and reactive to light and accommodation. Sclera clear. Oropharynx is clear. Neck is supple no JVD, adenopathy, thyromegaly, or bruits. Lungs are clear. Cardiac exam reveals a normal rate with an irregular pulse he has no gallop, murmur, or click. Abdomen is soft and  nontender without masses or bruits. Femoral and pedal pulses are 2+ and symmetric. Skin is warm and dry. He is alert and oriented x3. He has no focal findings. LABORATORY DATA: Prior echocardiogram demonstrates normal left ventricular function. There is moderate mitral insufficiency with moderate left atrial enlargement.  Assessment / Plan:

## 2010-12-28 NOTE — Assessment & Plan Note (Signed)
Rate is well controlled. He is asymptomatic. He is anticoagulated on Pradaxa. Since he has moderate left atrial enlargement I think it would be difficult to restore sinus rhythm and maintain it. I would recommend long-term treatment with rate control and anticoagulation.

## 2011-01-29 DIAGNOSIS — H8110 Benign paroxysmal vertigo, unspecified ear: Secondary | ICD-10-CM | POA: Insufficient documentation

## 2011-05-02 DIAGNOSIS — M25569 Pain in unspecified knee: Secondary | ICD-10-CM | POA: Insufficient documentation

## 2011-06-01 ENCOUNTER — Other Ambulatory Visit: Payer: Self-pay | Admitting: Cardiology

## 2011-06-01 DIAGNOSIS — R809 Proteinuria, unspecified: Secondary | ICD-10-CM | POA: Insufficient documentation

## 2011-08-02 ENCOUNTER — Encounter: Payer: Self-pay | Admitting: Cardiology

## 2011-08-02 ENCOUNTER — Ambulatory Visit (INDEPENDENT_AMBULATORY_CARE_PROVIDER_SITE_OTHER): Payer: Medicare Other | Admitting: Cardiology

## 2011-08-02 VITALS — BP 152/71 | HR 73 | Ht 67.0 in | Wt 151.0 lb

## 2011-08-02 DIAGNOSIS — I059 Rheumatic mitral valve disease, unspecified: Secondary | ICD-10-CM

## 2011-08-02 DIAGNOSIS — I4891 Unspecified atrial fibrillation: Secondary | ICD-10-CM

## 2011-08-02 DIAGNOSIS — I10 Essential (primary) hypertension: Secondary | ICD-10-CM

## 2011-08-02 DIAGNOSIS — I340 Nonrheumatic mitral (valve) insufficiency: Secondary | ICD-10-CM

## 2011-08-02 NOTE — Assessment & Plan Note (Signed)
He had moderate mitral insufficiency by echo. His exam is unchanged and he is asymptomatic.

## 2011-08-02 NOTE — Patient Instructions (Signed)
Continue your current medication.  I will see you again in 6 months.   

## 2011-08-02 NOTE — Assessment & Plan Note (Signed)
Blood pressure is well-controlled on losartan.  

## 2011-08-02 NOTE — Assessment & Plan Note (Signed)
His rate is under excellent control on no AV nodal blocking agents. He is chronically anticoagulated with Pradaxa. He is asymptomatic.

## 2011-08-02 NOTE — Progress Notes (Signed)
Cody Mendoza Date of Birth: 03-09-1921   History of Present Illness: [name] is seen today for followup of his atrial fibrillation. He has done very well over the past 6 months. He denies any symptoms of chest pain, shortness of breath, or dizziness. He brings extensive blood pressure readings which have been excellent. His pulse rate typically runs between 60 and 75 beats per minute. He denies any other new medical problems on this visit.  Current Outpatient Prescriptions on File Prior to Visit  Medication Sig Dispense Refill  . Cholecalciferol (VITAMIN D PO) Take 1,000 Units by mouth.        . Cyanocobalamin (VITAMIN B-12 PO) Take 1,000 Units by mouth. Taking 1/2 500 mcg Daily       . doxazosin (CARDURA) 4 MG tablet Take 4 mg by mouth at bedtime.        . fish oil-omega-3 fatty acids 1000 MG capsule Take 2 g by mouth 2 (two) times daily.        . hydrochlorothiazide 25 MG tablet Take 12.5 mg by mouth. Taking 1/2 Pill Daily      . losartan (COZAAR) 50 MG tablet Take 25 mg by mouth. Taking 1/2 Pill Daily      . Multiple Vitamin (MULTIVITAMIN PO) Take by mouth.        . pantoprazole (PROTONIX) 40 MG tablet Take 40 mg by mouth daily.        Marland Kitchen PRADAXA 150 MG CAPS take 1 capsule by mouth every 12 hours  60 capsule  5  . vitamin C (ASCORBIC ACID) 500 MG tablet Take 500 mg by mouth daily.          No Known Allergies  Past Medical History  Diagnosis Date  . Hypertension   . GERD (gastroesophageal reflux disease)   . Renal calculi   . Detached retina   . Asthma   . [name] syndrome   . Diverticulosis   . AF (atrial fibrillation)   . Mitral insufficiency     Past Surgical History  Procedure Date  . Kidney stone surgery     Status post syrgical removal of a   . Tonsillectomy   . Inguinal hernia repair   . Cataract surgery   . Finger tendon repair   . Esophageal dilation     In the past    History  Smoking status  . Never Smoker   Smokeless tobacco  . Not on  file    History  Alcohol Use  . Yes    rarely    Family History  Problem Relation Age of Onset  . Hypertension Mother     Review of Systems: As noted in history of present illness. All other systems were reviewed and are negative.  Physical Exam: BP 152/71  Pulse 73  Ht 5\' 7"  (1.702 m)  Wt 151 lb (68.493 kg)  BMI 23.65 kg/m2 He is a pleasant elderly white male in no acute distress. He is  hard of hearing. He is normocephalic, atraumatic. Pupils are equal round and reactive to light and accommodation. Sclera clear. Oropharynx is clear. Neck is supple no JVD, adenopathy, thyromegaly, or bruits. Lungs are clear. Cardiac exam reveals a normal rate with an irregular pulse he has no gallop, murmur, or click. Abdomen is soft and nontender without masses or bruits. Femoral and pedal pulses are 2+ and symmetric. Skin is warm and dry. He is alert and oriented x3. He has no focal findings. LABORATORY DATA: ECG  today demonstrates atrial fibrillation with occasional PVCs. Rate is 73 beats per minute. He has left axis deviation. There is poor R wave progression from V1 through V4.  Assessment / Plan:

## 2012-01-18 ENCOUNTER — Other Ambulatory Visit: Payer: Self-pay

## 2012-01-18 MED ORDER — DABIGATRAN ETEXILATE MESYLATE 150 MG PO CAPS: 150.0000 mg | ORAL_CAPSULE | Freq: Two times a day (BID) | ORAL | Status: DC

## 2012-03-01 ENCOUNTER — Other Ambulatory Visit: Payer: Self-pay | Admitting: Surgery

## 2012-04-24 ENCOUNTER — Encounter (HOSPITAL_COMMUNITY): Payer: Self-pay | Admitting: *Deleted

## 2012-04-24 ENCOUNTER — Emergency Department (HOSPITAL_COMMUNITY): Payer: Medicare Other

## 2012-04-24 ENCOUNTER — Emergency Department (HOSPITAL_COMMUNITY)
Admission: EM | Admit: 2012-04-24 | Discharge: 2012-04-24 | Disposition: A | Discharge status: Home / Self Care | Payer: Medicare Other | Attending: Emergency Medicine | Admitting: Emergency Medicine

## 2012-04-24 DIAGNOSIS — K219 Gastro-esophageal reflux disease without esophagitis: Secondary | ICD-10-CM | POA: Insufficient documentation

## 2012-04-24 DIAGNOSIS — Z79899 Other long term (current) drug therapy: Secondary | ICD-10-CM | POA: Insufficient documentation

## 2012-04-24 DIAGNOSIS — I10 Essential (primary) hypertension: Secondary | ICD-10-CM | POA: Insufficient documentation

## 2012-04-24 DIAGNOSIS — J45909 Unspecified asthma, uncomplicated: Secondary | ICD-10-CM | POA: Insufficient documentation

## 2012-04-24 DIAGNOSIS — Z87442 Personal history of urinary calculi: Secondary | ICD-10-CM | POA: Insufficient documentation

## 2012-04-24 DIAGNOSIS — Z8669 Personal history of other diseases of the nervous system and sense organs: Secondary | ICD-10-CM | POA: Insufficient documentation

## 2012-04-24 DIAGNOSIS — Z8639 Personal history of other endocrine, nutritional and metabolic disease: Secondary | ICD-10-CM | POA: Insufficient documentation

## 2012-04-24 DIAGNOSIS — Z8679 Personal history of other diseases of the circulatory system: Secondary | ICD-10-CM | POA: Insufficient documentation

## 2012-04-24 DIAGNOSIS — Z8719 Personal history of other diseases of the digestive system: Secondary | ICD-10-CM | POA: Insufficient documentation

## 2012-04-24 DIAGNOSIS — Z862 Personal history of diseases of the blood and blood-forming organs and certain disorders involving the immune mechanism: Secondary | ICD-10-CM | POA: Insufficient documentation

## 2012-04-24 DIAGNOSIS — R42 Dizziness and giddiness: Secondary | ICD-10-CM | POA: Insufficient documentation

## 2012-04-24 LAB — DIFFERENTIAL
Basophils Absolute: 0 10*3/uL (ref 0.0–0.1)
Basophils Relative: 0 % (ref 0–1)
Eosinophils Relative: 0 % (ref 0–5)
Lymphocytes Relative: 7 % — ABNORMAL LOW (ref 12–46)
Neutro Abs: 8.6 10*3/uL — ABNORMAL HIGH (ref 1.7–7.7)

## 2012-04-24 LAB — GLUCOSE, CAPILLARY: Glucose-Capillary: 129 mg/dL — ABNORMAL HIGH (ref 70–99)

## 2012-04-24 LAB — POCT I-STAT TROPONIN I: Troponin i, poc: 0.04 ng/mL (ref 0.00–0.08)

## 2012-04-24 LAB — CBC
MCHC: 34.9 g/dL (ref 30.0–36.0)
Platelets: 221 10*3/uL (ref 150–400)
RDW: 13 % (ref 11.5–15.5)
WBC: 10 10*3/uL (ref 4.0–10.5)

## 2012-04-24 LAB — COMPREHENSIVE METABOLIC PANEL
ALT: 18 U/L (ref 0–53)
AST: 26 U/L (ref 0–37)
Albumin: 4 g/dL (ref 3.5–5.2)
CO2: 31 mEq/L (ref 19–32)
Calcium: 9.2 mg/dL (ref 8.4–10.5)
Creatinine, Ser: 1.18 mg/dL (ref 0.50–1.35)
GFR calc non Af Amer: 53 mL/min — ABNORMAL LOW (ref >90)
Sodium: 134 mEq/L — ABNORMAL LOW (ref 135–145)

## 2012-04-24 LAB — APTT: aPTT: 53 seconds — ABNORMAL HIGH (ref 24–37)

## 2012-04-24 LAB — PROTIME-INR: Prothrombin Time: 18.6 seconds — ABNORMAL HIGH (ref 11.6–15.2)

## 2012-04-24 MED ORDER — ONDANSETRON HCL 4 MG/2ML IJ SOLN
4.0000 mg | Freq: Once | INTRAMUSCULAR | Status: AC | PRN
  Administered 2012-04-24: 4 mg via INTRAVENOUS
  Filled 2012-04-24: qty 2

## 2012-04-24 MED ORDER — MORPHINE SULFATE 4 MG/ML IJ SOLN
4.0000 mg | Freq: Once | INTRAMUSCULAR | Status: AC
  Administered 2012-04-24: 4 mg via INTRAVENOUS
  Filled 2012-04-24: qty 1

## 2012-04-24 MED ORDER — AMLODIPINE BESYLATE 2.5 MG PO TABS
2.5000 mg | ORAL_TABLET | Freq: Once | ORAL | Status: AC
  Administered 2012-04-24: 2.5 mg via ORAL
  Filled 2012-04-24: qty 1

## 2012-04-24 MED ORDER — HYDROCHLOROTHIAZIDE 25 MG PO TABS: 25.0000 mg | ORAL_TABLET | Freq: Every day | ORAL | Status: DC

## 2012-04-24 MED ORDER — AMLODIPINE BESYLATE 5 MG PO TABS
5.0000 mg | ORAL_TABLET | Freq: Once | ORAL | Status: AC
  Administered 2012-04-24: 5 mg via ORAL
  Filled 2012-04-24: qty 1

## 2012-04-24 NOTE — ED Notes (Signed)
Pt reports waking up this am with a h/a.  Pt presents in the ED with elevated BP.  Pt denies any CP or SOB at this time.  Pt also reports "staggering" since this am.  Pt denies noticing one side weakness at this time.  No facial droop or slurred speech at this time.

## 2012-04-24 NOTE — ED Notes (Signed)
Patient transported to MRI 

## 2012-04-24 NOTE — ED Notes (Signed)
Pts CBG was 129

## 2012-04-24 NOTE — ED Notes (Signed)
Patient transported to CT 

## 2012-04-24 NOTE — ED Notes (Signed)
Pt states that he taken his BP medications today.

## 2012-04-24 NOTE — ED Provider Notes (Signed)
History    CSN: [id number] Arrival date & time 04/24/12  0932 First MD Initiated Contact with Patient 04/24/12 1004      Chief Complaint  Patient presents with  . Headache  . Hypertension     HPI Comments: Pt woke up this morning with a bad headache.  He also noticed that when he got up he was not walking as steady as usual.  He felt like he was staggering.  He took his blood pressure and it was very elevated so he came to the ED.  He did take his BP meds this morning and has not missed any doses.  Patient is a 79 y.o. male presenting with headaches and hypertension. The history is provided by the patient.  Headache Pain location:  Frontal (around right eye) Quality:  Unable to specify (constant ache) Radiates to:  Does not radiate Severity currently:  5/10 Onset quality:  Unable to specify Timing:  Constant Chronicity:  New Similar to prior headaches: no   Ineffective treatments:  None tried Associated symptoms: dizziness and loss of balance   Associated symptoms: no fever, no focal weakness, no nausea, no near-syncope and no syncope   Hypertension Associated symptoms include headaches.    Past Medical History  Diagnosis Date  . Hypertension   . GERD (gastroesophageal reflux disease)   . Renal calculi   . Detached retina   . Asthma   . [name] syndrome   . Diverticulosis   . AF (atrial fibrillation)   . Mitral insufficiency     Past Surgical History  Procedure Laterality Date  . Kidney stone surgery      Status post syrgical removal of a   . Tonsillectomy    . Inguinal hernia repair    . Cataract surgery    . Finger tendon repair    . Esophageal dilation      In the past    Family History  Problem Relation Age of Onset  . Hypertension Mother     History  Substance Use Topics  . Smoking status: Never Smoker   . Smokeless tobacco: Not on file  . Alcohol Use: Yes     Comment: rarely      Review of Systems  Constitutional: Negative for fever.   Cardiovascular: Negative for syncope and near-syncope.  Gastrointestinal: Negative for nausea.  Neurological: Positive for dizziness, headaches and loss of balance. Negative for focal weakness.  All other systems reviewed and are negative.    Allergies  Review of patient's allergies indicates no known allergies.  Home Medications   Current Outpatient Rx  Name  Route  Sig  Dispense  Refill  . Cholecalciferol (VITAMIN D PO)   Oral   Take 1,000 Units by mouth.           . Cyanocobalamin (VITAMIN B-12 PO)   Oral   Take 1,000 Units by mouth. Taking 1/2 500 mcg Daily          . dabigatran (PRADAXA) 150 MG CAPS   Oral   Take 1 capsule (150 mg total) by mouth every 12 (twelve) hours.   60 capsule   5   . doxazosin (CARDURA) 4 MG tablet   Oral   Take 4 mg by mouth at bedtime.           . fish oil-omega-3 fatty acids 1000 MG capsule   Oral   Take 2 g by mouth 2 (two) times daily.           Marland Kitchen  hydrochlorothiazide 25 MG tablet   Oral   Take 12.5 mg by mouth. Taking 1/2 Pill Daily         . losartan (COZAAR) 50 MG tablet   Oral   Take 25 mg by mouth. Taking 1/2 Pill Daily         . Multiple Vitamin (MULTIVITAMIN PO)   Oral   Take by mouth.           . pantoprazole (PROTONIX) 40 MG tablet   Oral   Take 40 mg by mouth daily.           . vitamin C (ASCORBIC ACID) 500 MG tablet   Oral   Take 500 mg by mouth daily.             BP 174/124  Pulse 114  Temp(Src) 98.3 F (36.8 C) (Oral)  Resp 24  SpO2 98%  Physical Exam  Nursing note and vitals reviewed. Constitutional: He is oriented to person, place, and time. He appears well-developed and well-nourished. No distress.  HENT:  Head: Normocephalic and atraumatic.  Right Ear: External ear normal.  Left Ear: External ear normal.  Mouth/Throat: Oropharynx is clear and moist.  Eyes: Conjunctivae are normal. Right eye exhibits no discharge. Left eye exhibits no discharge. No scleral icterus.  Unequal  pupils, prior eye surgery  Neck: Neck supple. No tracheal deviation present.  Cardiovascular: Normal rate, regular rhythm and intact distal pulses.   Pulmonary/Chest: Effort normal and breath sounds normal. No stridor. No respiratory distress. He has no wheezes. He has no rales.  Abdominal: Soft. Bowel sounds are normal. He exhibits no distension. There is no tenderness. There is no rebound and no guarding.  Musculoskeletal: He exhibits no edema and no tenderness.  Neurological: He is alert and oriented to person, place, and time. He has normal strength. He is not disoriented. He displays tremor. No cranial nerve deficit ( no gross defecits noted) or sensory deficit. He exhibits normal muscle tone. He displays no seizure activity.  No pronator drift bilateral upper extrem, able to hold both legs off bed for 5 seconds, sensation intact in all extremities, no visual field cuts, no left or right sided neglect  Skin: Skin is warm and dry. No rash noted.  Psychiatric: He has a normal mood and affect.   ED Course  Procedures (including critical care time)  Labs Reviewed  PROTIME-INR - Abnormal; Notable for the following:    Prothrombin Time 18.6 (*)    INR 1.61 (*)    All other components within normal limits  APTT - Abnormal; Notable for the following:    aPTT 53 (*)    All other components within normal limits  CBC - Abnormal; Notable for the following:    RBC 4.15 (*)    HCT 38.4 (*)    All other components within normal limits  DIFFERENTIAL - Abnormal; Notable for the following:    Neutrophils Relative 86 (*)    Neutro Abs 8.6 (*)    Lymphocytes Relative 7 (*)    All other components within normal limits  COMPREHENSIVE METABOLIC PANEL - Abnormal; Notable for the following:    Sodium 134 (*)    Chloride 92 (*)    Glucose, Bld 142 (*)    Total Bilirubin 2.8 (*)    GFR calc non Af Amer 53 (*)    GFR calc Af Amer 62 (*)    All other components within normal limits  GLUCOSE, CAPILLARY  - Abnormal; Notable  for the following:    Glucose-Capillary 129 (*)    All other components within normal limits  TROPONIN I  POCT I-STAT TROPONIN I   Ct Head Wo Contrast  04/24/2012  *RADIOLOGY REPORT*  Clinical Data: Headache.  Hypertension.  CT HEAD WITHOUT CONTRAST  Technique:  Contiguous axial images were obtained from the base of the skull through the vertex without contrast.  Comparison: None.  Findings: The study is mildly degraded by patient motion.  Some images were repeated.  No acute cortical infarct, hemorrhage, or mass lesion is present. Moderate periventricular and subcortical white matter hypoattenuation is present bilaterally.  The ventricles are otherwise unremarkable.  No significant extra-axial fluid collection is present.  Minimal mucosal thickening is present in the maxillary sinuses bilaterally.  The paranasal sinuses and mastoid air cells are otherwise clear.  IMPRESSION:  1.  No acute intracranial abnormality. 2.  Atrophy and moderate white matter disease.  This likely reflects the sequelae of chronic microvascular ischemia. 3.  Minimal sinus disease.   Original Report Authenticated By: Marin Roberts, M.D.    Mr Brain Wo Contrast  04/24/2012  *RADIOLOGY REPORT*  Clinical Data: Headache.  Hypertension.  Confusion.  MRI HEAD WITHOUT CONTRAST  Technique:  Multiplanar, multiecho pulse sequences of the brain and surrounding structures were obtained according to standard protocol without intravenous contrast.  Comparison: CT head without contrast 04/24/2012.  Findings: The diffusion weighted images demonstrate no evidence for acute or subacute infarction.  Atrophy and advanced white matter disease is present bilaterally.  No hemorrhage or mass lesion is present.  The ventricles are proportionate to the degree of atrophy.  No significant extra-axial fluid collection is present.  Flow is present in the major intracranial arteries.  The patient is status post bilateral lens  extractions.  Mild mucosal thickening is present throughout the anterior paranasal sinuses.  There is minimal fluid and mastoid air cells bilaterally.  No obstructing nasopharyngeal lesion is present.  IMPRESSION:  1.  No acute or focal intracranial abnormality to explain the patient's symptoms. 2.  Moderate atrophy and white matter disease likely reflects sequelae of chronic microvascular ischemia. 3.  Mild anterior sinus disease.   Original Report Authenticated By: Marin Roberts, M.D.     1. Hypertension     MDM  The patient has no evidence of stroke or acute hemorrhage associated with his headache and hypertension today.  Patient was given a dose of oral Norvasc here in the emergency department The patient's symptoms have improved. I will have him increase his hydrochlorothiazide and have him followup with his primary care Dr. Patient has been instructed to return emergently for any worsening symptoms        Celene Kras, MD 04/24/12 [phone]

## 2012-05-16 DIAGNOSIS — N400 Enlarged prostate without lower urinary tract symptoms: Secondary | ICD-10-CM | POA: Insufficient documentation

## 2012-06-16 ENCOUNTER — Ambulatory Visit (INDEPENDENT_AMBULATORY_CARE_PROVIDER_SITE_OTHER): Payer: Medicare Other | Admitting: Family Medicine

## 2012-06-16 ENCOUNTER — Encounter: Payer: Self-pay | Admitting: Family Medicine

## 2012-06-16 VITALS — BP 153/81 | HR 85 | Temp 97.3°F | Resp 16 | Ht 64.5 in | Wt 161.4 lb

## 2012-06-16 DIAGNOSIS — M79609 Pain in unspecified limb: Secondary | ICD-10-CM

## 2012-06-16 DIAGNOSIS — S61209A Unspecified open wound of unspecified finger without damage to nail, initial encounter: Secondary | ICD-10-CM

## 2012-06-16 NOTE — Progress Notes (Signed)
79 year old gentleman who cut his left pinky finger on a reciprocating saw yesterday at 2:30 PM. He has minimal pain at this point and there has been no further bleeding today. His last tetanus shot was within 5 years  Objective: The laceration is involving only the distal phalanx and travels along the outside cuticle margin from the tip to just proximal to the nail. Patient's a full range of motion is established, vascular aspect is controlled, and patient has normal feeling. There is no tendon involvement.   Please see Eula Listen, PA note  assessment: Simple laceration left pinky finger with repair  Plan: Followup 7 days, sooner if consultations arise

## 2012-06-16 NOTE — Progress Notes (Signed)
   Patient ID: KILO ESHELMAN MRN: [id number], DOB: 21-Jan-1922, 79 y.o. Date of Encounter: 06/16/2012, 1:39 PM   PROCEDURE NOTE: Verbal consent obtained.  Risks and benefits of the procedure were explained. Patient made an informed decision to proceed with the procedure. Sterile technique employed. Numbing: Anesthesia obtained with 1% plain lidocaine and 0.5% Marcaine for digital block 3 cc total.   Cleansed with soap and water. Irrigated. Betadine prep per usual protocol.  Wound explored, no deep structures involved, no foreign bodies.   Wound repaired with # 9 simple interrupted sutures of 5-0 Prolene.  Hemostasis obtained. Wound cleansed and dressed.  Wound care instructions including precautions covered with patient. Handout given.  Anticipate suture removal in 10 days.   Signed, Eula Listen, PA-C 06/16/2012 1:39 PM

## 2012-06-26 ENCOUNTER — Ambulatory Visit (INDEPENDENT_AMBULATORY_CARE_PROVIDER_SITE_OTHER): Payer: Medicare Other | Admitting: Physician Assistant

## 2012-06-26 VITALS — BP 158/78 | HR 80 | Temp 98.0°F | Resp 16 | Ht 65.0 in | Wt 158.0 lb

## 2012-06-26 DIAGNOSIS — M064 Inflammatory polyarthropathy: Secondary | ICD-10-CM | POA: Insufficient documentation

## 2012-06-26 DIAGNOSIS — Z5189 Encounter for other specified aftercare: Secondary | ICD-10-CM

## 2012-06-26 DIAGNOSIS — S61209D Unspecified open wound of unspecified finger without damage to nail, subsequent encounter: Secondary | ICD-10-CM

## 2012-06-26 NOTE — Progress Notes (Signed)
  Subjective:    Patient ID: Cody Mendoza, male    DOB: 1921-12-07, 79 y.o.   MRN: [id number]  HPI 79 year old male presents for suture removal. Doing well. No issues or complaints. Denies warmth, tenderness, erythema, or drainage. DOI 06/16/12.     Review of Systems  Constitutional: Negative for fever and chills.  Gastrointestinal: Negative for nausea and vomiting.  Skin: Positive for wound. Negative for color change.       Objective:   Physical Exam  Constitutional: He is oriented to person, place, and time. He appears well-developed and well-nourished.  HENT:  Head: Normocephalic and atraumatic.  Eyes: Conjunctivae are normal.  Cardiovascular: Normal rate.   Pulmonary/Chest: Effort normal.  Neurological: He is alert and oriented to person, place, and time.  Skin:  Wound of left distal 5th digit well-healing. No erythema, warmth, or tenderness. Scab in place.   Psychiatric: He has a normal mood and affect. His behavior is normal. Judgment and thought content normal.     #9 sutures removed without difficulty.  Patient tolerated well.      Assessment & Plan:  Wound, open, finger, subsequent encounter  Wound care provided Follow up as needed.

## 2012-07-05 ENCOUNTER — Ambulatory Visit (INDEPENDENT_AMBULATORY_CARE_PROVIDER_SITE_OTHER): Payer: Medicare Other | Admitting: Emergency Medicine

## 2012-07-05 ENCOUNTER — Encounter: Payer: Self-pay | Admitting: Emergency Medicine

## 2012-07-05 VITALS — BP 164/106 | HR 84 | Resp 18

## 2012-07-05 DIAGNOSIS — S61219A Laceration without foreign body of unspecified finger without damage to nail, initial encounter: Secondary | ICD-10-CM

## 2012-07-05 DIAGNOSIS — S61209A Unspecified open wound of unspecified finger without damage to nail, initial encounter: Secondary | ICD-10-CM

## 2012-07-05 DIAGNOSIS — M25549 Pain in joints of unspecified hand: Secondary | ICD-10-CM

## 2012-07-05 NOTE — Progress Notes (Signed)
Procedure Note: Verbal consent obtained from the patient.  Metacarpal block with 2 cc Lidocaine 2% without epinephrine.  Wound scrubbed with soap and water.  Wound explored.  No foreign bodies or deep structure injury noted.  Wound closed with 5 simple interrupted sutures of 5-0 ethilon.  Area cleansed and dressed.  Wound care discussed.  Pt tolerated the procedure very well.

## 2012-07-05 NOTE — Progress Notes (Signed)
[facility] [address] [place] [address] [phone]  Date:  07/05/2012   Name:  Cody Mendoza   DOB:  [birth year]   MRN:  [id number]  PCP:  Ezequiel Kayser, MD    Chief Complaint: No chief complaint on file.   History of Present Illness:  Cody Mendoza is a 79 y.o. very pleasant male patient who presents with the following:  Injured right third finger on a rotating lawn mower blade that continued to turn after the clutch was released.  He has pain in the terminal phalange pad.  Current on TD  Patient Active Problem List   Diagnosis Date Noted  . Mitral insufficiency 12/28/2010  . Atrial fibrillation 11/16/2010  . Hypertension   . GERD 04/22/2010  . PERSONAL HX COLONIC POLYPS 04/22/2010    Past Medical History  Diagnosis Date  . Hypertension   . GERD (gastroesophageal reflux disease)   . Renal calculi   . Detached retina   . Asthma   . [name] syndrome   . Diverticulosis   . AF (atrial fibrillation)   . Mitral insufficiency     Past Surgical History  Procedure Laterality Date  . Kidney stone surgery      Status post syrgical removal of a   . Tonsillectomy    . Inguinal hernia repair    . Cataract surgery    . Finger tendon repair    . Esophageal dilation      In the past    History  Substance Use Topics  . Smoking status: Never Smoker   . Smokeless tobacco: Not on file  . Alcohol Use: Yes     Comment: rarely    Family History  Problem Relation Age of Onset  . Hypertension Mother     No Known Allergies  Medication list has been reviewed and updated.  Current Outpatient Prescriptions on File Prior to Visit  Medication Sig Dispense Refill  . dabigatran (PRADAXA) 150 MG CAPS Take 1 capsule (150 mg total) by mouth every 12 (twelve) hours.  60 capsule  5  . doxazosin (CARDURA) 4 MG tablet Take 4 mg by mouth at bedtime.        . hydrochlorothiazide (HYDRODIURIL) 25 MG tablet Take 1 tablet (25 mg total) by mouth  daily.  30 tablet  0  . losartan (COZAAR) 50 MG tablet Take 25 mg by mouth every morning.       . Multiple Vitamin (MULTIVITAMIN WITH MINERALS) TABS Take 1 tablet by mouth daily before supper.       . pantoprazole (PROTONIX) 40 MG tablet Take 40 mg by mouth every morning.       . testosterone cypionate (DEPOTESTOTERONE CYPIONATE) 100 MG/ML injection Inject 400 mg into the muscle every 28 (twenty-eight) days. For IM use only      . vitamin B-12 (CYANOCOBALAMIN) 1000 MCG tablet Take 500 mcg by mouth daily before supper.       . vitamin C (ASCORBIC ACID) 500 MG tablet Take 500 mg by mouth daily before supper.        No current facility-administered medications on file prior to visit.    Review of Systems:  As per HPI, otherwise negative.    Physical Examination: There were no vitals filed for this visit. There were no vitals filed for this visit. There is no weight on file to calculate BMI. Ideal Body Weight:     GEN: WDWN, NAD, Non-toxic, Alert & Oriented x  3 HEENT: Atraumatic, Normocephalic.  Ears and Nose: No external deformity. EXTR: No clubbing/cyanosis/edema NEURO: Normal gait.  PSYCH: Normally interactive. Conversant. Not depressed or anxious appearing.  Calm demeanor.  Right hand:  1 cm laceration flexor surface.  NATI  No FB  Assessment and Plan: Laceration finger Suture repair   Signed,  Phillips Odor, MD

## 2012-07-05 NOTE — Patient Instructions (Signed)

## 2012-07-16 ENCOUNTER — Encounter: Payer: Self-pay | Admitting: Physician Assistant

## 2012-07-16 ENCOUNTER — Ambulatory Visit (INDEPENDENT_AMBULATORY_CARE_PROVIDER_SITE_OTHER): Payer: Medicare Other | Admitting: Physician Assistant

## 2012-07-16 VITALS — BP 146/70 | HR 77 | Temp 97.5°F | Resp 16 | Ht 64.5 in | Wt 147.0 lb

## 2012-07-16 DIAGNOSIS — Z4802 Encounter for removal of sutures: Secondary | ICD-10-CM

## 2012-07-16 NOTE — Progress Notes (Signed)
  Subjective:    Patient ID: Cody Mendoza, male    DOB: [birth year], 79 y.o.   MRN: [id number]  HPI    Cody Mendoza is a very pleasant 79 yr old male here for removal of sutures that were placed here 10 days ago.  Pt states he is doing very well.  Denies pain or drainage.   Review of Systems  Skin: Positive for wound.  All other systems reviewed and are negative.       Objective:   Physical Exam  Vitals reviewed. Constitutional: He is oriented to person, place, and time. He appears well-developed and well-nourished. No distress.  HENT:  Head: Normocephalic and atraumatic.  Eyes: Conjunctivae are normal. No scleral icterus.  Pulmonary/Chest: Effort normal.  Neurological: He is alert and oriented to person, place, and time.  Skin: Skin is warm and dry.  Well healed laceration on right 3rd finger, 5 simple interrupted sutures in place  Psychiatric: He has a normal mood and affect. His behavior is normal.          Assessment & Plan:  Visit for suture removal   Cody Mendoza is a very pleasant 79 yr old male here for removal of sutures.  5 simple interrupted sutures removed without difficulty.  Laceration is well healed.  No need for further follow up unless new concerns arise.

## 2012-12-10 ENCOUNTER — Encounter: Payer: Self-pay | Admitting: Physician Assistant

## 2012-12-10 ENCOUNTER — Ambulatory Visit (INDEPENDENT_AMBULATORY_CARE_PROVIDER_SITE_OTHER): Payer: Medicare Other | Admitting: Physician Assistant

## 2012-12-10 VITALS — BP 136/72 | HR 72 | Ht 67.0 in | Wt 147.0 lb

## 2012-12-10 DIAGNOSIS — I340 Nonrheumatic mitral (valve) insufficiency: Secondary | ICD-10-CM

## 2012-12-10 DIAGNOSIS — I10 Essential (primary) hypertension: Secondary | ICD-10-CM

## 2012-12-10 DIAGNOSIS — I059 Rheumatic mitral valve disease, unspecified: Secondary | ICD-10-CM

## 2012-12-10 DIAGNOSIS — I4891 Unspecified atrial fibrillation: Secondary | ICD-10-CM

## 2012-12-10 LAB — CBC WITH DIFFERENTIAL/PLATELET
Basophils Absolute: 0 10*3/uL (ref 0.0–0.1)
Basophils Relative: 0.4 % (ref 0.0–3.0)
Eosinophils Relative: 1.2 % (ref 0.0–5.0)
HCT: 31.5 % — ABNORMAL LOW (ref 39.0–52.0)
Hemoglobin: 10.8 g/dL — ABNORMAL LOW (ref 13.0–17.0)
Lymphocytes Relative: 17.6 % (ref 12.0–46.0)
Lymphs Abs: 1.3 10*3/uL (ref 0.7–4.0)
Monocytes Relative: 9 % (ref 3.0–12.0)
Neutro Abs: 5.1 10*3/uL (ref 1.4–7.7)
RBC: 3.38 Mil/uL — ABNORMAL LOW (ref 4.22–5.81)
RDW: 12.6 % (ref 11.5–14.6)
WBC: 7.1 10*3/uL (ref 4.5–10.5)

## 2012-12-10 LAB — BASIC METABOLIC PANEL
Calcium: 9 mg/dL (ref 8.4–10.5)
GFR: 61.79 mL/min (ref >60.00)
Glucose, Bld: 135 mg/dL — ABNORMAL HIGH (ref 70–99)
Potassium: 3.3 mEq/L — ABNORMAL LOW (ref 3.5–5.1)
Sodium: 128 mEq/L — ABNORMAL LOW (ref 135–145)

## 2012-12-10 NOTE — Patient Instructions (Addendum)
LAB TODAY; BMET, CBC W/DIFF  Your physician wants you to follow-up in: 6 MONTHS WITH DR. [place]. You will receive a reminder letter in the mail two months in advance. If you don't receive a letter, please call our office to schedule the follow-up appointment.  Your physician recommends that you continue on your current medications as directed. Please refer to the Current Medication list given to you today.

## 2012-12-10 NOTE — Progress Notes (Signed)
[address] [place], [place]  [address] Phone: [phone] Fax:  [phone]  Date:  12/10/2012   ID:  SHUAYB SCHEPERS, DOB Dec 18, 1921, MRN [id number]  PCP:  Ezequiel Kayser, MD  Cardiologist:  Dr. Peter Swaziland     History of Present Illness: Cody Mendoza is a 79 y.o. male who returns for followup.  He has a history of permanent atrial fibrillation on chronic anticoagulation therapy with Pradaxa, HTN, GERD, Guilbert syndrome, mitral regurgitation.  Echo (9/12): Mild LVH, EF 55-60%, normal wall motion, moderate MR, moderate LAE, moderate RAE, PASP 42.  Last seen by Dr. Swaziland 07/2011.  Since last seen, he denies chest pain, shortness of breath, syncope, orthopnea, PND or edema. He remains quite active. He is NYHA class II.  Labs (2/14):  K 3.7, creatinine 1.18, ALT 18, Hgb 13.4   Wt Readings from Last 3 Encounters:  07/16/12 147 lb (66.679 kg)  06/26/12 158 lb (71.668 kg)  06/16/12 161 lb 6.4 oz (73.211 kg)     Past Medical History  Diagnosis Date  . Hypertension   . GERD (gastroesophageal reflux disease)   . Renal calculi   . Detached retina   . Asthma   . [name] syndrome   . Diverticulosis   . AF (atrial fibrillation)   . Mitral insufficiency     Current Outpatient Prescriptions  Medication Sig Dispense Refill  . dabigatran (PRADAXA) 150 MG CAPS Take 1 capsule (150 mg total) by mouth every 12 (twelve) hours.  60 capsule  5  . doxazosin (CARDURA) 4 MG tablet Take 4 mg by mouth at bedtime.        . hydrochlorothiazide (HYDRODIURIL) 25 MG tablet Take 1 tablet (25 mg total) by mouth daily.  30 tablet  0  . losartan (COZAAR) 50 MG tablet Take 25 mg by mouth every morning.       . Multiple Vitamin (MULTIVITAMIN WITH MINERALS) TABS Take 1 tablet by mouth daily before supper.       . pantoprazole (PROTONIX) 40 MG tablet Take 40 mg by mouth every morning.       . testosterone cypionate (DEPOTESTOTERONE CYPIONATE) 100 MG/ML injection Inject 400 mg into the  muscle every 28 (twenty-eight) days. For IM use only      . vitamin B-12 (CYANOCOBALAMIN) 1000 MCG tablet Take 500 mcg by mouth daily before supper.       . vitamin C (ASCORBIC ACID) 500 MG tablet Take 500 mg by mouth daily before supper.        No current facility-administered medications for this visit.    Allergies:   No Known Allergies  Social History:  The patient  reports that he has never smoked. He does not have any smokeless tobacco history on file. He reports that  drinks alcohol.   Family History:  The patient's family history includes Hypertension in his mother.   ROS:  Please see the history of present illness.   No bleeding problems.   All other systems reviewed and negative.   PHYSICAL EXAM: VS:  BP 136/72  Pulse 72  Ht 5\' 7"  (1.702 m)  Wt 147 lb (66.679 kg)  BMI 23.02 kg/m2 Well nourished, well developed, in no acute distress HEENT: normal Neck: no JVD Vascular: No carotid bruits Cardiac:  normal S1, S2; irregularly irregular rhythm; I cannot appreciate a murmur Lungs:  clear to auscultation bilaterally, no wheezing, rhonchi or rales Abd: soft, nontender, no hepatomegaly Ext: no edema Skin: warm and  dry Neuro:  CNs 2-12 intact, no focal abnormalities noted  EKG:  Atrial fibrillation, HR 72     ASSESSMENT AND PLAN:  1. Atrial Fibrillation:  Rate controlled. He remains on Pradaxa. Check a basic metabolic panel and CBC today. 2. Mitral Regurgitation:  He remains asymptomatic. His murmur is not appreciable on exam today. Continue current therapy. 3. Hypertension:  Controlled. 4. Disposition:  Follow up with Dr. Swaziland in 6 months.  Signed, Tereso Newcomer, PA-C  12/10/2012 2:14 PM

## 2012-12-11 ENCOUNTER — Telehealth: Payer: Self-pay | Admitting: *Deleted

## 2012-12-11 DIAGNOSIS — I10 Essential (primary) hypertension: Secondary | ICD-10-CM

## 2012-12-11 NOTE — Telephone Encounter (Signed)
lmptcb go over labs results and med changes. I will try again tomorrow

## 2012-12-12 MED ORDER — HYDROCHLOROTHIAZIDE 25 MG PO TABS
12.5000 mg | ORAL_TABLET | Freq: Every day | ORAL | Status: DC
Start: 2012-12-12 — End: 2013-06-20

## 2012-12-12 NOTE — Telephone Encounter (Signed)
Follow Up ° °Pt returning call about results.  °

## 2012-12-12 NOTE — Telephone Encounter (Signed)
pt notified about lab results and to decrease HCTZ to 12.5 mg daily also to increase dietary K+ gave some examples to pt of these type foods. BMET 12/19/12. Will fax results to PCP, pt advised to f/u w/PCP about anemia. pt verbalized understanding

## 2012-12-19 ENCOUNTER — Other Ambulatory Visit (INDEPENDENT_AMBULATORY_CARE_PROVIDER_SITE_OTHER): Payer: Medicare Other

## 2012-12-19 DIAGNOSIS — I10 Essential (primary) hypertension: Secondary | ICD-10-CM

## 2012-12-19 LAB — BASIC METABOLIC PANEL
CO2: 30 mEq/L (ref 19–32)
Calcium: 9.3 mg/dL (ref 8.4–10.5)
Chloride: 91 mEq/L — ABNORMAL LOW (ref 96–112)
Creatinine, Ser: 1.2 mg/dL (ref 0.4–1.5)
Glucose, Bld: 107 mg/dL — ABNORMAL HIGH (ref 70–99)

## 2012-12-20 ENCOUNTER — Telehealth: Payer: Self-pay | Admitting: *Deleted

## 2012-12-20 DIAGNOSIS — I10 Essential (primary) hypertension: Secondary | ICD-10-CM

## 2012-12-20 NOTE — Telephone Encounter (Signed)
pt notified about lab results and to limit fluid intake to 50-60 oz daily. bmet 01/25/13.Marland Kitchenpt verbalized understanding to Plan of Care

## 2012-12-20 NOTE — Telephone Encounter (Signed)
Lab order placed.

## 2013-03-03 DIAGNOSIS — R141 Gas pain: Secondary | ICD-10-CM | POA: Insufficient documentation

## 2013-03-28 ENCOUNTER — Other Ambulatory Visit: Payer: Self-pay | Admitting: Family Medicine

## 2013-03-28 DIAGNOSIS — R198 Other specified symptoms and signs involving the digestive system and abdomen: Secondary | ICD-10-CM

## 2013-03-29 ENCOUNTER — Other Ambulatory Visit: Payer: Self-pay | Admitting: Family Medicine

## 2013-03-29 ENCOUNTER — Ambulatory Visit
Admission: RE | Admit: 2013-03-29 | Discharge: 2013-03-29 | Disposition: A | Discharge status: Home / Self Care | Payer: Medicare Other | Source: Ambulatory Visit | Attending: Family Medicine | Admitting: Family Medicine

## 2013-03-29 DIAGNOSIS — R198 Other specified symptoms and signs involving the digestive system and abdomen: Secondary | ICD-10-CM

## 2013-06-20 ENCOUNTER — Encounter: Payer: Self-pay | Admitting: Cardiology

## 2013-06-20 ENCOUNTER — Ambulatory Visit (INDEPENDENT_AMBULATORY_CARE_PROVIDER_SITE_OTHER): Payer: Medicare Other | Admitting: Cardiology

## 2013-06-20 VITALS — BP 187/90 | HR 86 | Ht 67.0 in | Wt 145.0 lb

## 2013-06-20 DIAGNOSIS — I059 Rheumatic mitral valve disease, unspecified: Secondary | ICD-10-CM

## 2013-06-20 DIAGNOSIS — I4891 Unspecified atrial fibrillation: Secondary | ICD-10-CM

## 2013-06-20 DIAGNOSIS — I10 Essential (primary) hypertension: Secondary | ICD-10-CM

## 2013-06-20 DIAGNOSIS — I340 Nonrheumatic mitral (valve) insufficiency: Secondary | ICD-10-CM

## 2013-06-20 NOTE — Patient Instructions (Signed)
Continue your current therapy  You may take an extra losartan 25 mg as needed for BP.  I will see you in 6 months.

## 2013-06-20 NOTE — Progress Notes (Signed)
Cody Mendoza Date of Birth: 05-11-1921   History of Present Illness: Cody Mendoza is seen today for followup of his atrial fibrillation. He has done very well over the past 6 months. He denies any symptoms of chest pain, shortness of breath, or dizziness. His BP typically runs between 283-151 systolic with occ reading of 165. He stays very active. On Pradaxa.   Current Outpatient Prescriptions on File Prior to Visit  Medication Sig Dispense Refill  . dabigatran (PRADAXA) 150 MG CAPS Take 1 capsule (150 mg total) by mouth every 12 (twelve) hours.  60 capsule  5  . doxazosin (CARDURA) 4 MG tablet Take 4 mg by mouth at bedtime.        Marland Kitchen losartan (COZAAR) 50 MG tablet Take 25 mg by mouth every morning.       . Multiple Vitamin (MULTIVITAMIN WITH MINERALS) TABS Take 1 tablet by mouth daily before supper.       . pantoprazole (PROTONIX) 40 MG tablet Take 40 mg by mouth every morning.       . vitamin B-12 (CYANOCOBALAMIN) 1000 MCG tablet Take 500 mcg by mouth daily before supper.       . vitamin C (ASCORBIC ACID) 500 MG tablet Take 500 mg by mouth daily before supper.        No current facility-administered medications on file prior to visit.    No Known Allergies  Past Medical History  Diagnosis Date  . Hypertension   . GERD (gastroesophageal reflux disease)   . Renal calculi   . Detached retina   . Asthma   . [name] syndrome   . Diverticulosis   . AF (atrial fibrillation)   . Mitral insufficiency     Past Surgical History  Procedure Laterality Date  . Kidney stone surgery      Status post syrgical removal of a   . Tonsillectomy    . Inguinal hernia repair    . Cataract surgery    . Finger tendon repair    . Esophageal dilation      In the past    History  Smoking status  . Never Smoker   Smokeless tobacco  . Not on file    History  Alcohol Use  . Yes    Comment: rarely    Family History  Problem Relation Age of Onset  . Hypertension Mother      Review of Systems: As noted in history of present illness. All other systems were reviewed and are negative.  Physical Exam: BP 187/90  Pulse 86  Ht 5\' 7"  (1.702 m)  Wt 145 lb (65.772 kg)  BMI 22.71 kg/m2 He is a pleasant elderly white male in no acute distress.  HEENT is normal. Neck is supple no JVD, adenopathy, thyromegaly, or bruits. Lungs are clear. Cardiac exam reveals a normal rate with an irregular pulse he has no gallop, murmur, or click. Abdomen is soft and nontender without masses or bruits. Femoral and pedal pulses are 2+ and symmetric. Skin is warm and dry. He is alert and oriented x3. He has no focal findings.  LABORATORY DATA: Dated 06/12/13: Creatinine 1.1. Chemistries all normal. Hgb 11.1. Cholesterol 133, trig 38, HDL 50, LDL 75. Tsh normal.  Assessment / Plan: 1. Atrial fibrillation. Chronic, persistent. Rate well controlled. Continue Pradaxa.  2. HTN. BP significantly elevated today but usually controlled. Recommend he take an extra 25 mg losartan if BP runs high.   3. Moderate mitral insufficiency.   Follow  up in 6 months.

## 2013-10-08 ENCOUNTER — Ambulatory Visit (INDEPENDENT_AMBULATORY_CARE_PROVIDER_SITE_OTHER): Payer: Medicare Other | Admitting: Family Medicine

## 2013-10-08 VITALS — BP 164/60 | HR 81 | Temp 97.5°F | Resp 16 | Ht 64.75 in | Wt 147.0 lb

## 2013-10-08 DIAGNOSIS — S81831A Puncture wound without foreign body, right lower leg, initial encounter: Secondary | ICD-10-CM

## 2013-10-08 DIAGNOSIS — S81009A Unspecified open wound, unspecified knee, initial encounter: Secondary | ICD-10-CM

## 2013-10-08 DIAGNOSIS — S81809A Unspecified open wound, unspecified lower leg, initial encounter: Secondary | ICD-10-CM

## 2013-10-08 DIAGNOSIS — S91009A Unspecified open wound, unspecified ankle, initial encounter: Secondary | ICD-10-CM

## 2013-10-08 MED ORDER — CEPHALEXIN 500 MG PO CAPS: 500.0000 mg | ORAL_CAPSULE | Freq: Four times a day (QID) | ORAL | Status: DC

## 2013-10-08 NOTE — Patient Instructions (Signed)

## 2013-10-08 NOTE — Progress Notes (Signed)
Cody Mendoza is a 80 y.o. male who presents today for puncture wound.    Pt states he was working on some old fencing last night and one of the wires ended up poking him in the R lower shin area, causing a puncture wound.  Pt states he ended up washing it washing it out with soap and water and some peroxide eventually.  Pt denies any drainage, numbness, tingling, weakness of his lower extremity.  Pt's last tetanus shot was October 2013.    Past Medical History  Diagnosis Date  . Hypertension   . GERD (gastroesophageal reflux disease)   . Renal calculi   . Detached retina   . Asthma   . [name] syndrome   . Diverticulosis   . AF (atrial fibrillation)   . Mitral insufficiency     History  Smoking status  . Never Smoker   Smokeless tobacco  . Not on file    Family History  Problem Relation Age of Onset  . Hypertension Mother     Current Outpatient Prescriptions on File Prior to Visit  Medication Sig Dispense Refill  . dabigatran (PRADAXA) 150 MG CAPS Take 1 capsule (150 mg total) by mouth every 12 (twelve) hours.  60 capsule  5  . doxazosin (CARDURA) 4 MG tablet Take 4 mg by mouth at bedtime.        . fluticasone (FLONASE) 50 MCG/ACT nasal spray As directed      . losartan (COZAAR) 50 MG tablet Take 25 mg by mouth every morning.       . Multiple Vitamin (MULTIVITAMIN WITH MINERALS) TABS Take 1 tablet by mouth daily before supper.       . Multiple Vitamins-Minerals (ICAPS AREDS FORMULA PO) Take by mouth. 1 tab twice a day      . pantoprazole (PROTONIX) 40 MG tablet Take 40 mg by mouth every morning.       [name] Glycol-Propyl Glycol (SYSTANE) 0.4-0.3 % SOLN Apply to eye. Apply to left eye as directed      . vitamin B-12 (CYANOCOBALAMIN) 1000 MCG tablet Take 500 mcg by mouth daily before supper.       . vitamin C (ASCORBIC ACID) 500 MG tablet Take 500 mg by mouth daily before supper.        No current facility-administered medications on file prior to visit.     ROS: Per HPI.  All other systems reviewed and are negative.   Physical Exam Filed Vitals:   10/08/13 0816  BP: 164/60  Pulse: 81  Temp: 97.5 F (36.4 C)  Resp: 16    Physical Examination: Musculoskeletal - no joint tenderness, deformity or swelling Extremities - peripheral pulses normal, no pedal edema, no clubbing or cyanosis, no pedal edema noted Skin - + 1 cm anterior distal tibial puncture wound w/o erythema or drainage     Chemistry      Component Value Date/Time   NA 128* 12/19/2012 0949   K 3.8 12/19/2012 0949   CL 91* 12/19/2012 0949   CO2 30 12/19/2012 0949   BUN 20 12/19/2012 0949   CREATININE 1.2 12/19/2012 0949      Component Value Date/Time   CALCIUM 9.3 12/19/2012 0949   ALKPHOS 82 04/24/2012 1020   AST 26 04/24/2012 1020   ALT 18 04/24/2012 1020   BILITOT 2.8* 04/24/2012 1020      Lab Results  Component Value Date   WBC 7.1 12/10/2012   HGB 10.8* 12/10/2012   HCT 31.5*  12/10/2012   MCV 93.1 12/10/2012   PLT 212.0 12/10/2012   No results found for this basename: TSH   No results found for this basename: HGBA1C

## 2013-10-08 NOTE — Assessment & Plan Note (Addendum)
Pt up to date with tetanus immunizations (last one was October 2013) and is not diabetic w/o S/Sx of infection.  Will tx conservatively with proper wound care, Keflex 500 mg QID x 7 days, and f/u in one week if no improvement or worsening S/Sx.

## 2013-10-08 NOTE — Progress Notes (Signed)
Patient examine and discussed with Dr. Awanda Mink. Agree with assessment and plan of care per his note, as well as physical findings. Briefly, patient working around fence yesterday with puncture wound on anterior R lower leg.  Hemostatic at present without s/sx's of infection. UTD on tetanus. As crosses some under skin will cover with keflex, wound care and rtc precautions discussed, including frequent washing and handout as below. RTC precautions discussed with pt and understanding expressed.

## 2013-12-18 ENCOUNTER — Encounter: Payer: Self-pay | Admitting: Cardiology

## 2013-12-18 ENCOUNTER — Ambulatory Visit (INDEPENDENT_AMBULATORY_CARE_PROVIDER_SITE_OTHER): Payer: Medicare Other | Admitting: Cardiology

## 2013-12-18 VITALS — BP 136/68 | HR 84 | Ht 67.0 in | Wt 150.2 lb

## 2013-12-18 DIAGNOSIS — I482 Chronic atrial fibrillation, unspecified: Secondary | ICD-10-CM

## 2013-12-18 DIAGNOSIS — I340 Nonrheumatic mitral (valve) insufficiency: Secondary | ICD-10-CM

## 2013-12-18 DIAGNOSIS — I10 Essential (primary) hypertension: Secondary | ICD-10-CM

## 2013-12-18 NOTE — Progress Notes (Signed)
Cody Mendoza Date of Birth: [birth year]   History of Present Illness: [name] is seen today for followup of his chronic atrial fibrillation. He is on no rate control. He has done very well over the past 6 months. He denies any symptoms of chest pain, shortness of breath. His losartan and Cardura were reduced due to lightheadedness.  His BP has been well controlled. He stays very active. On Pradaxa.   Current Outpatient Prescriptions on File Prior to Visit  Medication Sig Dispense Refill  . cephALEXin (KEFLEX) 500 MG capsule Take 1 capsule (500 mg total) by mouth 4 (four) times daily.  28 capsule  0  . dabigatran (PRADAXA) 150 MG CAPS Take 1 capsule (150 mg total) by mouth every 12 (twelve) hours.  60 capsule  5  . fluticasone (FLONASE) 50 MCG/ACT nasal spray As directed      . Multiple Vitamin (MULTIVITAMIN WITH MINERALS) TABS Take 1 tablet by mouth daily before supper.       . Multiple Vitamins-Minerals (ICAPS AREDS FORMULA PO) Take by mouth. 1 tab twice a day      . pantoprazole (PROTONIX) 40 MG tablet Take 40 mg by mouth every morning.       [name] Glycol-Propyl Glycol (SYSTANE) 0.4-0.3 % SOLN Apply to eye. Apply to left eye as directed      . vitamin B-12 (CYANOCOBALAMIN) 1000 MCG tablet Take 500 mcg by mouth daily before supper.       . vitamin C (ASCORBIC ACID) 500 MG tablet Take 500 mg by mouth daily before supper.        No current facility-administered medications on file prior to visit.    No Known Allergies  Past Medical History  Diagnosis Date  . Hypertension   . GERD (gastroesophageal reflux disease)   . Renal calculi   . Detached retina   . Asthma   . [name] syndrome   . Diverticulosis   . AF (atrial fibrillation)   . Mitral insufficiency     Past Surgical History  Procedure Laterality Date  . Kidney stone surgery      Status post syrgical removal of a   . Tonsillectomy    . Inguinal hernia repair    . Cataract surgery    . Finger tendon  repair    . Esophageal dilation      In the past    History  Smoking status  . Never Smoker   Smokeless tobacco  . Not on file    History  Alcohol Use  . Yes    Comment: rarely    Family History  Problem Relation Age of Onset  . Hypertension Mother     Review of Systems: As noted in history of present illness. All other systems were reviewed and are negative.  Physical Exam: BP 136/68  Pulse 84  Ht 5\' 7"  (1.702 m)  Wt 150 lb 3.2 oz (68.13 kg)  BMI 23.52 kg/m2 He is a pleasant elderly white male in no acute distress.  HEENT is normal. Neck is supple no JVD, adenopathy, thyromegaly, or bruits. Lungs are clear. Cardiac exam reveals a normal rate with an irregular pulse he has no gallop, murmur, or click. Abdomen is soft and nontender without masses or bruits. Femoral and pedal pulses are 2+ and symmetric. Skin is warm and dry. He is alert and oriented x3. He has no focal findings.  LABORATORY DATA: Ecg: Afib with rate 84 bpm. Occ. PVC. Otherwise normal.  Assessment /  Plan: 1. Atrial fibrillation. Chronic, persistent. Rate well controlled. Continue Pradaxa.  2. HTN. BP well controlled.   3. Moderate mitral insufficiency.   We will continue his current therapy and follow up in one year.

## 2013-12-18 NOTE — Patient Instructions (Signed)
Continue your current therapy  I will see you in one year   

## 2014-05-28 DIAGNOSIS — H9190 Unspecified hearing loss, unspecified ear: Secondary | ICD-10-CM | POA: Insufficient documentation

## 2014-07-03 DIAGNOSIS — R946 Abnormal results of thyroid function studies: Secondary | ICD-10-CM | POA: Insufficient documentation

## 2014-09-21 ENCOUNTER — Emergency Department (HOSPITAL_COMMUNITY)
Admission: EM | Admit: 2014-09-21 | Discharge: 2014-09-21 | Disposition: A | Discharge status: Home / Self Care | Payer: Medicare Other | Attending: Emergency Medicine | Admitting: Emergency Medicine

## 2014-09-21 ENCOUNTER — Emergency Department (HOSPITAL_COMMUNITY): Payer: Medicare Other

## 2014-09-21 ENCOUNTER — Encounter (HOSPITAL_COMMUNITY): Payer: Self-pay

## 2014-09-21 DIAGNOSIS — Z8669 Personal history of other diseases of the nervous system and sense organs: Secondary | ICD-10-CM | POA: Diagnosis not present

## 2014-09-21 DIAGNOSIS — R42 Dizziness and giddiness: Secondary | ICD-10-CM | POA: Insufficient documentation

## 2014-09-21 DIAGNOSIS — J45909 Unspecified asthma, uncomplicated: Secondary | ICD-10-CM | POA: Insufficient documentation

## 2014-09-21 DIAGNOSIS — E871 Hypo-osmolality and hyponatremia: Secondary | ICD-10-CM | POA: Diagnosis not present

## 2014-09-21 DIAGNOSIS — I4891 Unspecified atrial fibrillation: Secondary | ICD-10-CM | POA: Insufficient documentation

## 2014-09-21 DIAGNOSIS — K219 Gastro-esophageal reflux disease without esophagitis: Secondary | ICD-10-CM | POA: Diagnosis not present

## 2014-09-21 DIAGNOSIS — R112 Nausea with vomiting, unspecified: Secondary | ICD-10-CM | POA: Insufficient documentation

## 2014-09-21 DIAGNOSIS — I10 Essential (primary) hypertension: Secondary | ICD-10-CM | POA: Diagnosis not present

## 2014-09-21 DIAGNOSIS — Z79899 Other long term (current) drug therapy: Secondary | ICD-10-CM | POA: Insufficient documentation

## 2014-09-21 DIAGNOSIS — Z87442 Personal history of urinary calculi: Secondary | ICD-10-CM | POA: Insufficient documentation

## 2014-09-21 LAB — URINALYSIS, ROUTINE W REFLEX MICROSCOPIC
Bilirubin Urine: NEGATIVE
GLUCOSE, UA: NEGATIVE mg/dL
Ketones, ur: NEGATIVE mg/dL
LEUKOCYTES UA: NEGATIVE
NITRITE: NEGATIVE
PROTEIN: NEGATIVE mg/dL
SPECIFIC GRAVITY, URINE: 1.026 (ref 1.005–1.030)
Urobilinogen, UA: 1 mg/dL (ref 0.0–1.0)
pH: 5.5 (ref 5.0–8.0)

## 2014-09-21 LAB — I-STAT CHEM 8, ED
BUN: 18 mg/dL (ref 6–20)
Calcium, Ion: 1.12 mmol/L — ABNORMAL LOW (ref 1.13–1.30)
Chloride: 88 mmol/L — ABNORMAL LOW (ref 101–111)
Creatinine, Ser: 1.1 mg/dL (ref 0.61–1.24)
Glucose, Bld: 119 mg/dL — ABNORMAL HIGH (ref 65–99)
HEMATOCRIT: 37 % — AB (ref 39.0–52.0)
HEMOGLOBIN: 12.6 g/dL — AB (ref 13.0–17.0)
POTASSIUM: 3.7 mmol/L (ref 3.5–5.1)
SODIUM: 124 mmol/L — AB (ref 135–145)
TCO2: 23 mmol/L (ref 0–100)

## 2014-09-21 LAB — URINE MICROSCOPIC-ADD ON

## 2014-09-21 LAB — BASIC METABOLIC PANEL
Anion gap: 5 (ref 5–15)
BUN: 18 mg/dL (ref 6–20)
CHLORIDE: 93 mmol/L — AB (ref 101–111)
CO2: 26 mmol/L (ref 22–32)
CREATININE: 1.04 mg/dL (ref 0.61–1.24)
Calcium: 8.4 mg/dL — ABNORMAL LOW (ref 8.9–10.3)
GFR calc Af Amer: >60 mL/min (ref >60)
GFR calc non Af Amer: >60 mL/min (ref >60)
Glucose, Bld: 121 mg/dL — ABNORMAL HIGH (ref 65–99)
Potassium: 3.9 mmol/L (ref 3.5–5.1)
Sodium: 124 mmol/L — ABNORMAL LOW (ref 135–145)

## 2014-09-21 MED ORDER — SODIUM CHLORIDE 0.9 % IV BOLUS (SEPSIS)
500.0000 mL | Freq: Once | INTRAVENOUS | Status: AC
  Administered 2014-09-21: 500 mL via INTRAVENOUS

## 2014-09-21 NOTE — ED Notes (Signed)
MD at bedside. 

## 2014-09-21 NOTE — Discharge Instructions (Signed)
Hyponatremia  Hyponatremia is when the amount of salt (sodium) in your blood is too low. When sodium levels are low, your cells will absorb extra water and swell. The swelling happens throughout the body, but it mostly affects the brain. Severe brain swelling (cerebral edema), seizures, or coma can happen.  CAUSES   Heart, kidney, or liver problems.  Thyroid problems.  Adrenal gland problems.  Severe vomiting and diarrhea.  Certain medicines or illegal drugs.  Dehydration.  Drinking too much water.  Low-sodium diet. SYMPTOMS   Nausea and vomiting.  Confusion.  Lethargy.  Agitation.  Headache.  Twitching or shaking (seizures).  Unconsciousness.  Appetite loss.  Muscle weakness and cramping. DIAGNOSIS  Hyponatremia is identified by a simple blood test. Your caregiver will perform a history and physical exam to try to find the cause and type of hyponatremia. Other tests may be needed to measure the amount of sodium in your blood and urine. TREATMENT  Treatment will depend on the cause.   Fluids may be given through the vein (IV).  Medicines may be used to correct the sodium imbalance. If medicines are causing the problem, they will need to be adjusted.  Water or fluid intake may be restricted to restore proper balance. The speed of correcting the sodium problem is very important. If the problem is corrected too fast, nerve damage (sometimes unchangeable) can happen. HOME CARE INSTRUCTIONS   Only take medicines as directed by your caregiver. Many medicines can make hyponatremia worse. Discuss all your medicines with your caregiver.  Carefully follow any recommended diet, including any fluid restrictions.  You may be asked to repeat lab tests. Follow these directions.  Avoid alcohol and recreational drugs. SEEK MEDICAL CARE IF:   You develop worsening nausea, fatigue, headache, confusion, or weakness.  Your original hyponatremia symptoms return.  You have  problems following the recommended diet. SEEK IMMEDIATE MEDICAL CARE IF:   You have a seizure.  You faint.  You have ongoing diarrhea or vomiting. MAKE SURE YOU:   Understand these instructions.  Will watch your condition.  Will get help right away if you are not doing well or get worse. Document Released: 02/11/2002 Document Revised: 05/16/2011 Document Reviewed: 08/08/2010 [facility] Patient Information 2015 [place], [place]. This information is not intended to replace advice given to you by your health care provider. Make sure you discuss any questions you have with your health care provider.  Vertigo Vertigo means you feel like you or your surroundings are moving when they are not. Vertigo can be dangerous if it occurs when you are at work, driving, or performing difficult activities.  CAUSES  Vertigo occurs when there is a conflict of signals sent to your brain from the visual and sensory systems in your body. There are many different causes of vertigo, including:  Infections, especially in the inner ear.  A bad reaction to a drug or misuse of alcohol and medicines.  Withdrawal from drugs or alcohol.  Rapidly changing positions, such as lying down or rolling over in bed.  A migraine headache.  Decreased blood flow to the brain.  Increased pressure in the brain from a head injury, infection, tumor, or bleeding. SYMPTOMS  You may feel as though the world is spinning around or you are falling to the ground. Because your balance is upset, vertigo can cause nausea and vomiting. You may have involuntary eye movements (nystagmus). DIAGNOSIS  Vertigo is usually diagnosed by physical exam. If the cause of your vertigo is unknown, your  caregiver may perform imaging tests, such as an MRI scan (magnetic resonance imaging). TREATMENT  Most cases of vertigo resolve on their own, without treatment. Depending on the cause, your caregiver may prescribe certain medicines. If your vertigo is  related to body position issues, your caregiver may recommend movements or procedures to correct the problem. In rare cases, if your vertigo is caused by certain inner ear problems, you may need surgery. HOME CARE INSTRUCTIONS   Follow your caregiver's instructions.  Avoid driving.  Avoid operating heavy machinery.  Avoid performing any tasks that would be dangerous to you or others during a vertigo episode.  Tell your caregiver if you notice that certain medicines seem to be causing your vertigo. Some of the medicines used to treat vertigo episodes can actually make them worse in some people. SEEK IMMEDIATE MEDICAL CARE IF:   Your medicines do not relieve your vertigo or are making it worse.  You develop problems with talking, walking, weakness, or using your arms, hands, or legs.  You develop severe headaches.  Your nausea or vomiting continues or gets worse.  You develop visual changes.  A family member notices behavioral changes.  Your condition gets worse. MAKE SURE YOU:  Understand these instructions.  Will watch your condition.  Will get help right away if you are not doing well or get worse. Document Released: 12/01/2004 Document Revised: 05/16/2011 Document Reviewed: 09/09/2010 [facility] Patient Information 2015 [place], [place]. This information is not intended to replace advice given to you by your health care provider. Make sure you discuss any questions you have with your health care provider.

## 2014-09-21 NOTE — ED Notes (Signed)
Patient currently not in room. WIll come back after patient arrives.

## 2014-09-21 NOTE — ED Notes (Signed)
Pt walked well with stand-by assist.

## 2014-09-21 NOTE — ED Provider Notes (Signed)
CSN: [id number]     Arrival date & time 09/21/14  1442 History   First MD Initiated Contact with Patient 09/21/14 1512     Chief Complaint  Patient presents with  . Dizziness     (Consider location/radiation/quality/duration/timing/severity/associated sxs/prior Treatment) HPI Comments: Patient with a history of atrial fibrillation, on. Axilla, hypertension, and mitral insufficiency presents with dizziness. He states that at 2:30 yesterday morning he woke up to go the bathroom and as he was getting out of bed he had a sudden sensation that the room is spinning. He had associated nausea and vomiting. He denies any headache. He denies any chest pain or shortness of breath. He denies any fevers. No nausea or vomiting. No fevers or chills. No runny nose or congestion. No cough or chest congestion other than a baseline nonproductive cough which is unchanged. He called his primary care physician's office and the on-call physician prescribed him meclizine. He started taking the meclizine yesterday and he is actually feeling much better today. He states the dizziness is better and his balance seems to be better. He's had no ongoing vomiting. However he does work out side and the heat a lot doing yard work and his son who is in [place] was concerned that he might be dehydrated and advised him to come here to get evaluated. He denies any recent head trauma.  Patient is a 81 y.o. male presenting with dizziness.  Dizziness Associated symptoms: nausea and vomiting   Associated symptoms: no blood in stool, no chest pain, no diarrhea, no headaches, no shortness of breath and no weakness     Past Medical History  Diagnosis Date  . Hypertension   . GERD (gastroesophageal reflux disease)   . Renal calculi   . Detached retina   . Asthma   . [name] syndrome   . Diverticulosis   . AF (atrial fibrillation)   . Mitral insufficiency    Past Surgical History  Procedure Laterality Date  . Kidney stone  surgery      Status post syrgical removal of a   . Tonsillectomy    . Inguinal hernia repair    . Cataract surgery    . Finger tendon repair    . Esophageal dilation      In the past   Family History  Problem Relation Age of Onset  . Hypertension Mother    History  Substance Use Topics  . Smoking status: Never Smoker   . Smokeless tobacco: Not on file  . Alcohol Use: Yes     Comment: rarely    Review of Systems  Constitutional: Negative for fever, chills, diaphoresis and fatigue.  HENT: Negative for congestion, rhinorrhea and sneezing.   Eyes: Negative.   Respiratory: Negative for cough, chest tightness and shortness of breath.   Cardiovascular: Negative for chest pain and leg swelling.  Gastrointestinal: Positive for nausea and vomiting. Negative for abdominal pain, diarrhea and blood in stool.  Genitourinary: Negative for frequency, hematuria, flank pain and difficulty urinating.  Musculoskeletal: Negative for back pain and arthralgias.  Skin: Negative for rash.  Neurological: Positive for dizziness. Negative for speech difficulty, weakness, numbness and headaches.      Allergies  Review of patient's allergies indicates no known allergies.  Home Medications   Prior to Admission medications   Medication Sig Start Date End Date Taking? Authorizing Provider  dabigatran (PRADAXA) 150 MG CAPS Take 1 capsule (150 mg total) by mouth every 12 (twelve) hours. 01/18/12  Yes Ander Slade  Martinique, MD  doxazosin (CARDURA) 4 MG tablet Take 2 mg by mouth every evening.   Yes Historical Provider, MD  fluticasone (FLONASE) 50 MCG/ACT nasal spray Place 2 sprays into both nostrils every evening. As directed 05/09/13  Yes Historical Provider, MD  losartan (COZAAR) 50 MG tablet Take 25 mg by mouth every morning.   Yes Historical Provider, MD  meclizine (ANTIVERT) 25 MG tablet take 1 tablet by mouth every 6 hours if needed for VERTIGI/DIZZINESS 09/20/14  Yes Historical Provider, MD  Multiple  Vitamin (MULTIVITAMIN WITH MINERALS) TABS Take 1 tablet by mouth daily before supper.    Yes Historical Provider, MD  Multiple Vitamins-Minerals (ICAPS AREDS FORMULA PO) Take 1 tablet by mouth 2 (two) times daily. 1 tab twice a day   Yes Historical Provider, MD  pantoprazole (PROTONIX) 40 MG tablet Take 40 mg by mouth every morning.    Yes Historical Provider, MD  Polyethyl Glycol-Propyl Glycol (SYSTANE) 0.4-0.3 % SOLN Place 1 application into the left eye daily. Apply to left eye as directed   Yes Historical Provider, MD  vitamin B-12 (CYANOCOBALAMIN) 1000 MCG tablet Take 500 mcg by mouth daily before supper.    Yes Historical Provider, MD  vitamin C (ASCORBIC ACID) 500 MG tablet Take 500 mg by mouth daily before supper.    Yes Historical Provider, MD  cephALEXin (KEFLEX) 500 MG capsule Take 1 capsule (500 mg total) by mouth 4 (four) times daily. Patient not taking: Reported on 09/21/2014 10/08/13   Bryan R Hess, DO   BP 181/84 mmHg  Pulse 66  Resp 16  SpO2 97% Physical Exam  Constitutional: He is oriented to person, place, and time. He appears well-developed and well-nourished.  HENT:  Head: Normocephalic and atraumatic.  Right Ear: External ear normal.  Left Ear: External ear normal.  Mouth/Throat: Oropharynx is clear and moist.  Eyes: EOM are normal. Pupils are equal, round, and reactive to light.  No nystagmus  Neck: Normal range of motion. Neck supple.  Cardiovascular: Normal rate, regular rhythm and normal heart sounds.   Pulmonary/Chest: Effort normal and breath sounds normal. No respiratory distress. He has no wheezes. He has no rales. He exhibits no tenderness.  Abdominal: Soft. Bowel sounds are normal. There is no tenderness. There is no rebound and no guarding.  Musculoskeletal: Normal range of motion. He exhibits no edema.  Lymphadenopathy:    He has no cervical adenopathy.  Neurological: He is alert and oriented to person, place, and time.  Motor 5 out of 5 all extremities,  sensation grossly intact to light touch all extremities, finger-nose intact, no pronator drift, cranial nerves 2 through 12 grossly intact  Skin: Skin is warm and dry. No rash noted.  Psychiatric: He has a normal mood and affect.    ED Course  Procedures (including critical care time) Labs Review Labs Reviewed  URINALYSIS, ROUTINE W REFLEX MICROSCOPIC (NOT AT [facility]) - Abnormal; Notable for the following:    Color, Urine AMBER (*)    APPearance CLOUDY (*)    Hgb urine dipstick SMALL (*)    All other components within normal limits  URINE MICROSCOPIC-ADD ON - Abnormal; Notable for the following:    Squamous Epithelial / LPF FEW (*)    All other components within normal limits  BASIC METABOLIC PANEL - Abnormal; Notable for the following:    Sodium 124 (*)    Chloride 93 (*)    Glucose, Bld 121 (*)    Calcium 8.4 (*)  All other components within normal limits  I-STAT CHEM 8, ED - Abnormal; Notable for the following:    Sodium 124 (*)    Chloride 88 (*)    Glucose, Bld 119 (*)    Calcium, Ion 1.12 (*)    Hemoglobin 12.6 (*)    HCT 37.0 (*)    All other components within normal limits    Imaging Review Ct Head Wo Contrast  09/21/2014   CLINICAL DATA:  Dizziness, alert and oriented  EXAM: CT HEAD WITHOUT CONTRAST  TECHNIQUE: Contiguous axial images were obtained from the base of the skull through the vertex without intravenous contrast.  COMPARISON:  MR brain 04/24/2012  FINDINGS: There is no evidence of mass effect, midline shift, or extra-axial fluid collections. There is no evidence of a space-occupying lesion or intracranial hemorrhage. There is no evidence of a cortical-based area of acute infarction. There is generalized cerebral atrophy. There is periventricular white matter low attenuation likely secondary to microangiopathy.  The ventricles and sulci are appropriate for the patient's age. The basal cisterns are patent.  Visualized portions of the orbits are unremarkable. The  visualized portions of the paranasal sinuses and mastoid air cells are unremarkable. Cerebrovascular atherosclerotic calcifications are noted.  The osseous structures are unremarkable.  IMPRESSION: 1. No acute intracranial pathology. 2. Chronic microvascular disease and cerebral atrophy.   Electronically Signed   By: Kathreen Devoid   On: 09/21/2014 15:59     EKG Interpretation   Date/Time:  Sunday September 21 2014 15:44:05 EDT Ventricular Rate:  55 PR Interval:    QRS Duration: 89 QT Interval:  454 QTC Calculation: 434 R Axis:   -13 Text Interpretation:  Atrial fibrillation Ventricular premature complex  Anterior infarct, old similar to prior EKGs Confirmed by Bexley Mclester  MD,  Kynisha Memon ([id number]) on 09/21/2014 3:48:10 PM      MDM   Final diagnoses:  Vertigo  Hyponatremia    Patient presents with dizziness. His symptoms sound consistent with peripheral vertigo. He had a sudden onset of symptoms as he was getting out of bed. He has no other neurologic deficits. His symptoms have resolved with Antivert. He's ambulating without ataxia. In the ED, he is completely asymptomatic. On screening blood work, his sodium is 124. His last sodium in our system was 128 in October 2014. I don't see any medications that he is on that would contribute to hyponatremia. He is currently asymptomatic. He states he has had some fatigue but it's been going on for several months and actually is much better since they decreased his doxazosin.  Don't feel that he definitely needs to be admitted for the sodium given that I feel that it's more of a chronic hyponatremia rather than acute hyponatremia. He is completely asymptomatic. He's ambulating without instability. I spoke with Dr. Osborne Casco who is on-call for the patient's primary care physician, Dr. Joylene Draft, who is agreeable to patient's discharge. He advises patient can be followed up early this week in the office. I had a long discussion with the patient and his daughter regarding  this. He will continue to take the meclizine as needed for the vertigo symptoms. They will call the office in the morning to arrange close follow-up with Dr. Joylene Draft. He was given strict return precautions.    Malvin Johns, MD 09/21/14 [phone]

## 2014-09-21 NOTE — ED Notes (Signed)
He states he felt sudden onset of vertigo "spinning" yesterday while he was entering his bathroom. He is alert and oriented x 4 with clear speech.  He states he has never had this before.

## 2014-09-30 ENCOUNTER — Encounter: Payer: Self-pay | Admitting: Gastroenterology

## 2014-12-15 ENCOUNTER — Ambulatory Visit (INDEPENDENT_AMBULATORY_CARE_PROVIDER_SITE_OTHER): Payer: Medicare Other | Admitting: Cardiology

## 2014-12-15 ENCOUNTER — Encounter: Payer: Self-pay | Admitting: Cardiology

## 2014-12-15 VITALS — BP 178/60 | HR 76 | Ht 67.0 in | Wt 148.9 lb

## 2014-12-15 DIAGNOSIS — I10 Essential (primary) hypertension: Secondary | ICD-10-CM

## 2014-12-15 DIAGNOSIS — I482 Chronic atrial fibrillation, unspecified: Secondary | ICD-10-CM

## 2014-12-15 DIAGNOSIS — I340 Nonrheumatic mitral (valve) insufficiency: Secondary | ICD-10-CM

## 2014-12-15 NOTE — Progress Notes (Signed)
Cody Mendoza Date of Birth: 09/26/1921   History of Present Illness: Mr. Vandervliet is seen today for followup of his chronic atrial fibrillation. He is on no rate control. He is anticoagulated with Pradaxa. He has done very well over the past 6 months. He denies any symptoms of chest pain, shortness of breath.  His BP has been well controlled based on his home readings between 564-332 systolic and 95-18 diastolic. 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 He was seen in the ED in July with acute vertigo. This resolved without recurrence.   Current Outpatient Prescriptions on File Prior to Visit  Medication Sig Dispense Refill  . dabigatran (PRADAXA) 150 MG CAPS Take 1 capsule (150 mg total) by mouth every 12 (twelve) hours. 60 capsule 5  . doxazosin (CARDURA) 4 MG tablet Take 2 mg by mouth every evening.    . fluticasone (FLONASE) 50 MCG/ACT nasal spray Place 2 sprays into both nostrils every evening. As directed    . losartan (COZAAR) 50 MG tablet Take 25 mg by mouth every morning.    . Multiple Vitamin (MULTIVITAMIN WITH MINERALS) TABS Take 1 tablet by mouth daily before supper.     . Multiple Vitamins-Minerals (ICAPS AREDS FORMULA PO) Take 1 tablet by mouth 2 (two) times daily. 1 tab twice a day    . pantoprazole (PROTONIX) 40 MG tablet Take 40 mg by mouth every morning.     [name] Glycol-Propyl Glycol (SYSTANE) 0.4-0.3 % SOLN Place 1 application into the left eye daily. Apply to left eye as directed    . vitamin B-12 (CYANOCOBALAMIN) 1000 MCG tablet Take 500 mcg by mouth daily before supper.     . vitamin C (ASCORBIC ACID) 500 MG tablet Take 500 mg by mouth daily before supper.      No current facility-administered medications on file prior to visit.    No Known Allergies  Past Medical History  Diagnosis Date  . Hypertension   . GERD (gastroesophageal reflux disease)   . Renal calculi   . Detached retina   . Asthma   . [name] syndrome   .  Diverticulosis   . AF (atrial fibrillation) ([place])   . Mitral insufficiency     Past Surgical History  Procedure Laterality Date  . Kidney stone surgery      Status post syrgical removal of a   . Tonsillectomy    . Inguinal hernia repair    . Cataract surgery    . Finger tendon repair    . Esophageal dilation      In the past    History  Smoking status  . Never Smoker   Smokeless tobacco  . Not on file    History  Alcohol Use  . 0.0 oz/week  . 0 Standard drinks or equivalent per week    Comment: rarely    Family History  Problem Relation Age of Onset  . Hypertension Mother     Review of Systems: As noted in history of present illness. All other systems were reviewed and are negative.  Physical Exam: BP 178/60 mmHg  Pulse 76  Ht 5\' 7"  (1.702 m)  Wt 67.541 kg (148 lb 14.4 oz)  BMI 23.32 kg/m2 He is a pleasant elderly white male in no acute distress.  HEENT is normal. Neck is supple no JVD, adenopathy, thyromegaly, or bruits. Lungs are clear. Cardiac exam reveals a normal rate with an irregular pulse he has no gallop, murmur, or click. Abdomen  is soft and nontender without masses or bruits. Femoral and pedal pulses are 2+ and symmetric. Skin is warm and dry. He is alert and oriented x3. He has no focal findings.  LABORATORY DATA:   Assessment / Plan: 1. Atrial fibrillation. Chronic, persistent. Rate well controlled. Continue Pradaxa.  2. HTN. BP elevated today but excellent control at home. Will continue current therapy.  3. Moderate mitral insufficiency.   We will continue his current therapy and follow up in one year.

## 2014-12-15 NOTE — Patient Instructions (Signed)
Continue your current therapy  I will see you in one year   

## 2015-11-13 DIAGNOSIS — R42 Dizziness and giddiness: Secondary | ICD-10-CM | POA: Insufficient documentation

## 2016-02-13 NOTE — Progress Notes (Signed)
Cody Mendoza Date of Birth: 02/14/1922   History of Present Illness: Mr. Casares is seen today for followup of his chronic atrial fibrillation. He is on no rate control. He is anticoagulated with Pradaxa. He has done very well over the past 6 months. He denies any symptoms of chest pain, shortness of breath.  His BP has elevated in the last 2 months. His losartan was just increased by Dr. Joylene Draft to 50 mg bid. 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   Current Outpatient Prescriptions on File Prior to Visit  Medication Sig Dispense Refill  . dabigatran (PRADAXA) 150 MG CAPS Take 1 capsule (150 mg total) by mouth every 12 (twelve) hours. 60 capsule 5  . doxazosin (CARDURA) 4 MG tablet Take 2 mg by mouth every evening.    . fluticasone (FLONASE) 50 MCG/ACT nasal spray Place 2 sprays into both nostrils every evening. As directed    . losartan (COZAAR) 50 MG tablet Take 50 mg by mouth 2 (two) times daily.     . Multiple Vitamin (MULTIVITAMIN WITH MINERALS) TABS Take 1 tablet by mouth daily before supper.     . Multiple Vitamins-Minerals (ICAPS AREDS FORMULA PO) Take 1 tablet by mouth 2 (two) times daily. 1 tab twice a day    . pantoprazole (PROTONIX) 40 MG tablet Take 40 mg by mouth every morning.     [name] Glycol-Propyl Glycol (SYSTANE) 0.4-0.3 % SOLN Place 1 application into the left eye daily. Apply to left eye as directed    . vitamin B-12 (CYANOCOBALAMIN) 1000 MCG tablet Take 500 mcg by mouth daily before supper.     . vitamin C (ASCORBIC ACID) 500 MG tablet Take 500 mg by mouth daily before supper.      No current facility-administered medications on file prior to visit.     No Known Allergies  Past Medical History:  Diagnosis Date  . AF (atrial fibrillation) ([place])   . Asthma   . Detached retina   . Diverticulosis   . GERD (gastroesophageal reflux disease)   . [name] syndrome   . Hypertension   . Mitral  insufficiency   . Renal calculi     Past Surgical History:  Procedure Laterality Date  . cataract surgery    . ESOPHAGEAL DILATION     In the past  . FINGER TENDON REPAIR    . INGUINAL HERNIA REPAIR    . KIDNEY STONE SURGERY     Status post syrgical removal of a   . TONSILLECTOMY      History  Smoking Status  . Never Smoker  Smokeless Tobacco  . Not on file    History  Alcohol Use  . 0.0 oz/week    Comment: rarely    Family History  Problem Relation Age of Onset  . Hypertension Mother     Review of Systems: As noted in history of present illness. All other systems were reviewed and are negative.  Physical Exam: BP (!) 150/82   Pulse 79   Ht 5\' 6"  (1.676 m)   Wt 155 lb 3.2 oz (70.4 kg)   BMI 25.05 kg/m  He is a pleasant elderly white male in no acute distress.  HEENT is normal. Neck is supple no JVD, adenopathy, thyromegaly, or bruits. Lungs are clear. Cardiac exam reveals a normal rate with an irregular pulse he has no gallop, murmur, or click. Abdomen is soft and nontender without masses  or bruits. Femoral and pedal pulses are 2+ and symmetric. Skin is warm and dry. He is alert and oriented x3. He has no focal findings.  LABORATORY DATA: Lab Results  Component Value Date   WBC 7.1 12/10/2012   HGB 12.6 (L) 09/21/2014   HCT 37.0 (L) 09/21/2014   PLT 212.0 12/10/2012   GLUCOSE 121 (H) 09/21/2014   ALT 18 04/24/2012   AST 26 04/24/2012   NA 124 (L) 09/21/2014   K 3.9 09/21/2014   CL 93 (L) 09/21/2014   CREATININE 1.04 09/21/2014   BUN 18 09/21/2014   CO2 26 09/21/2014   INR 1.61 (H) 04/24/2012   Labs dated 08/05/15: cholesterol 141, triglycerides 31, LDL 84, HDL 51. Chemistries, TSH, Hct normal Dated 11/13/15: BUN 17, creatinine 1.1.  Ecg today shows Atrial fibrillation with rate 79. Otherwise normal.  Assessment / Plan: 1. Atrial fibrillation. Chronic, persistent. Rate well controlled on no AV nodal blockers. Continue Pradaxa.  2. HTN. BP improved  by our readings today. I told him it may take 2-3 weeks for dose change to take full effect. Will continue to monitor. Would avoid BP medication that would affect conduction such as beta blockers or nondihydropiridine calcium channel blockers.   3. Moderate mitral insufficiency.   We will continue his current therapy and follow up in 6 months.

## 2016-02-15 ENCOUNTER — Ambulatory Visit (INDEPENDENT_AMBULATORY_CARE_PROVIDER_SITE_OTHER): Payer: Medicare Other | Admitting: Cardiology

## 2016-02-15 ENCOUNTER — Encounter: Payer: Self-pay | Admitting: Cardiology

## 2016-02-15 VITALS — BP 150/82 | HR 79 | Ht 66.0 in | Wt 155.2 lb

## 2016-02-15 DIAGNOSIS — I482 Chronic atrial fibrillation, unspecified: Secondary | ICD-10-CM

## 2016-02-15 DIAGNOSIS — I340 Nonrheumatic mitral (valve) insufficiency: Secondary | ICD-10-CM

## 2016-02-15 DIAGNOSIS — I10 Essential (primary) hypertension: Secondary | ICD-10-CM | POA: Diagnosis not present

## 2016-02-15 NOTE — Patient Instructions (Signed)
Continue your current therapy  Continue to monitor your blood pressure and let Dr. Joylene Draft know if it continues to be elevated.

## 2016-03-21 ENCOUNTER — Telehealth: Payer: Self-pay | Admitting: Cardiology

## 2016-03-21 NOTE — Telephone Encounter (Signed)
Returned call to patient.Dr.Jordan advised B/P doing better on amlodipine.Advised to continue.

## 2016-03-21 NOTE — Telephone Encounter (Signed)
Spoke with pt states that dr Martinique told him to call with his Blood pressuress. Today it is 145/80 63. Pt states that he does not take it everyday. Last time he took it 03-16-16 134/60 HR 54. 03-15-16 125/67 HR 70. 03-13-16 135/65 HR 66. Pt states that his average BP from 02-25-16 thru 03-06-16 was 150/76 HR 67. Then average from 03-07-16 thru 03-17-16 was 138/69 HR 61.

## 2016-03-21 NOTE — Telephone Encounter (Signed)
Mr. Abila is calling because he spoke w/ Dr. Joylene Draft on 12/21 and bp was still up up a little and Dr. Joylene Draft added Amlodipine 25 mg . Please call   Thanks

## 2016-03-21 NOTE — Telephone Encounter (Signed)
Looks like BP is doing better on amlodipine. Continue current Rx.  Nilo Fallin Martinique MD, [facility]

## 2016-08-18 NOTE — Progress Notes (Signed)
Gaston Islam Date of Birth: Nov 13, 1921   History of Present Illness: Mr. Nuno is seen today for followup of his chronic atrial fibrillation. He is on no rate control. He is anticoagulated with Pradaxa. He has done very well over the past 6 months. He denies any symptoms of chest pain, shortness of breath.  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 He does note some issues with his balance and legs stay tired. He brings extensive BP readings from home ranging from 810-175 systolic. Averaging in the 130s.   Current Outpatient Prescriptions on File Prior to Visit  Medication Sig Dispense Refill  . dabigatran (PRADAXA) 150 MG CAPS Take 1 capsule (150 mg total) by mouth every 12 (twelve) hours. 60 capsule 5  . doxazosin (CARDURA) 4 MG tablet Take 2 mg by mouth every evening.    Marland Kitchen losartan (COZAAR) 50 MG tablet Take 50 mg by mouth 2 (two) times daily.     . Multiple Vitamin (MULTIVITAMIN WITH MINERALS) TABS Take 1 tablet by mouth daily before supper.     . Multiple Vitamins-Minerals (ICAPS AREDS FORMULA PO) Take 1 tablet by mouth 2 (two) times daily. 1 tab twice a day    . pantoprazole (PROTONIX) 40 MG tablet Take 40 mg by mouth every morning.     [name] Glycol-Propyl Glycol (SYSTANE) 0.4-0.3 % SOLN Place 1 application into the left eye daily. Apply to left eye as directed    . vitamin B-12 (CYANOCOBALAMIN) 1000 MCG tablet Take 500 mcg by mouth daily before supper.     . vitamin C (ASCORBIC ACID) 500 MG tablet Take 500 mg by mouth daily before supper.     . vitamin E 100 UNIT capsule Take 100 Units by mouth 2 (two) times daily.     No current facility-administered medications on file prior to visit.     No Known Allergies  Past Medical History:  Diagnosis Date  . AF (atrial fibrillation) ([place])   . Asthma   . Detached retina   . Diverticulosis   . GERD (gastroesophageal reflux disease)   . [name] syndrome   . Hypertension   . Mitral  insufficiency   . Renal calculi     Past Surgical History:  Procedure Laterality Date  . cataract surgery    . ESOPHAGEAL DILATION     In the past  . FINGER TENDON REPAIR    . INGUINAL HERNIA REPAIR    . KIDNEY STONE SURGERY     Status post syrgical removal of a   . TONSILLECTOMY      History  Smoking Status  . Never Smoker  Smokeless Tobacco  . Never Used    History  Alcohol Use  . 0.0 oz/week    Comment: rarely    Family History  Problem Relation Age of Onset  . Hypertension Mother     Review of Systems: As noted in history of present illness. All other systems were reviewed and are negative.  Physical Exam: BP (!) 172/68 (BP Location: Left Arm, Patient Position: Sitting, Cuff Size: Normal)   Pulse 66   Ht 5\' 6"  (1.676 m)   Wt 149 lb 3.2 oz (67.7 kg)   BMI 24.08 kg/m  He is a pleasant elderly white male in no acute distress.  HEENT is normal. Neck is supple no JVD, adenopathy, thyromegaly, or bruits. Lungs are clear. Cardiac exam reveals a normal rate with an irregular pulse he has no gallop, murmur,  or click. Abdomen is soft and nontender without masses or bruits. Femoral and pedal pulses are 2+ and symmetric. Skin is warm and dry. He is alert and oriented x3. He has no focal findings.  LABORATORY DATA: Lab Results  Component Value Date   WBC 7.1 12/10/2012   HGB 12.6 (L) 09/21/2014   HCT 37.0 (L) 09/21/2014   PLT 212.0 12/10/2012   GLUCOSE 121 (H) 09/21/2014   ALT 18 04/24/2012   AST 26 04/24/2012   NA 124 (L) 09/21/2014   K 3.9 09/21/2014   CL 93 (L) 09/21/2014   CREATININE 1.04 09/21/2014   BUN 18 09/21/2014   CO2 26 09/21/2014   INR 1.61 (H) 04/24/2012   Labs dated 08/05/15: cholesterol 141, triglycerides 31, LDL 84, HDL 51. Chemistries, TSH, Hct normal Dated 11/13/15: BUN 17, creatinine 1.1.   Assessment / Plan: 1. Atrial fibrillation. Chronic, persistent. Rate well controlled on no AV nodal blockers. Continue Pradaxa.  2. HTN. BP control  acceptable by home recordings. Continue losartan and Cardura.  3. Moderate mitral insufficiency.   We will continue his current therapy and follow up in one year.

## 2016-08-19 ENCOUNTER — Ambulatory Visit (INDEPENDENT_AMBULATORY_CARE_PROVIDER_SITE_OTHER): Payer: Medicare Other | Admitting: Cardiology

## 2016-08-19 ENCOUNTER — Encounter: Payer: Self-pay | Admitting: Cardiology

## 2016-08-19 VITALS — BP 172/68 | HR 66 | Ht 66.0 in | Wt 149.2 lb

## 2016-08-19 DIAGNOSIS — I482 Chronic atrial fibrillation, unspecified: Secondary | ICD-10-CM

## 2016-08-19 DIAGNOSIS — I10 Essential (primary) hypertension: Secondary | ICD-10-CM

## 2016-08-19 DIAGNOSIS — I340 Nonrheumatic mitral (valve) insufficiency: Secondary | ICD-10-CM

## 2016-08-19 NOTE — Patient Instructions (Addendum)
Continue your current therapy  I will see you one year.   

## 2016-09-26 ENCOUNTER — Emergency Department (HOSPITAL_COMMUNITY)
Admission: EM | Admit: 2016-09-26 | Discharge: 2016-09-26 | Disposition: A | Discharge status: Home / Self Care | Payer: Medicare Other | Attending: Emergency Medicine | Admitting: Emergency Medicine

## 2016-09-26 ENCOUNTER — Encounter (HOSPITAL_COMMUNITY): Payer: Self-pay | Admitting: Emergency Medicine

## 2016-09-26 DIAGNOSIS — J45909 Unspecified asthma, uncomplicated: Secondary | ICD-10-CM | POA: Insufficient documentation

## 2016-09-26 DIAGNOSIS — Z79899 Other long term (current) drug therapy: Secondary | ICD-10-CM | POA: Insufficient documentation

## 2016-09-26 DIAGNOSIS — E871 Hypo-osmolality and hyponatremia: Secondary | ICD-10-CM | POA: Diagnosis present

## 2016-09-26 DIAGNOSIS — I10 Essential (primary) hypertension: Secondary | ICD-10-CM | POA: Insufficient documentation

## 2016-09-26 LAB — COMPREHENSIVE METABOLIC PANEL
ALBUMIN: 4.5 g/dL (ref 3.5–5.0)
ALT: 22 U/L (ref 17–63)
AST: 33 U/L (ref 15–41)
Alkaline Phosphatase: 75 U/L (ref 38–126)
Anion gap: 10 (ref 5–15)
BUN: 19 mg/dL (ref 6–20)
CALCIUM: 9.3 mg/dL (ref 8.9–10.3)
CO2: 26 mmol/L (ref 22–32)
Chloride: 90 mmol/L — ABNORMAL LOW (ref 101–111)
Creatinine, Ser: 1.27 mg/dL — ABNORMAL HIGH (ref 0.61–1.24)
GFR calc Af Amer: 55 mL/min — ABNORMAL LOW (ref >60)
GFR calc non Af Amer: 47 mL/min — ABNORMAL LOW (ref >60)
GLUCOSE: 98 mg/dL (ref 65–99)
Potassium: 4.2 mmol/L (ref 3.5–5.1)
Sodium: 126 mmol/L — ABNORMAL LOW (ref 135–145)
TOTAL PROTEIN: 7.1 g/dL (ref 6.5–8.1)
Total Bilirubin: 1.6 mg/dL — ABNORMAL HIGH (ref 0.3–1.2)

## 2016-09-26 LAB — CBC
HCT: 29.2 % — ABNORMAL LOW (ref 39.0–52.0)
Hemoglobin: 10.4 g/dL — ABNORMAL LOW (ref 13.0–17.0)
MCH: 31 pg (ref 26.0–34.0)
MCHC: 35.6 g/dL (ref 30.0–36.0)
MCV: 87.2 fL (ref 78.0–100.0)
Platelets: 197 10*3/uL (ref 150–400)
RBC: 3.35 MIL/uL — ABNORMAL LOW (ref 4.22–5.81)
RDW: 12.1 % (ref 11.5–15.5)
WBC: 6.9 10*3/uL (ref 4.0–10.5)

## 2016-09-26 MED ORDER — LOSARTAN POTASSIUM 50 MG PO TABS: 50.0000 mg | ORAL_TABLET | Freq: Two times a day (BID) | ORAL | Status: DC

## 2016-09-26 MED ORDER — SODIUM CHLORIDE 0.9 % IV BOLUS (SEPSIS)
1000.0000 mL | Freq: Once | INTRAVENOUS | Status: AC
  Administered 2016-09-26: 1000 mL via INTRAVENOUS

## 2016-09-26 MED ORDER — LOSARTAN POTASSIUM 50 MG PO TABS
50.0000 mg | ORAL_TABLET | Freq: Two times a day (BID) | ORAL | Status: AC
Start: 2016-09-26 — End: 2016-09-26
  Administered 2016-09-26: 50 mg via ORAL
  Filled 2016-09-26: qty 1

## 2016-09-26 MED ORDER — DOXAZOSIN MESYLATE 2 MG PO TABS
2.0000 mg | ORAL_TABLET | Freq: Every evening | ORAL | Status: DC
  Administered 2016-09-26: 2 mg via ORAL
  Filled 2016-09-26: qty 1

## 2016-09-26 NOTE — ED Triage Notes (Signed)
Pt c/o hyponatremia, sodium 127, today during blood draw. Pt had routine lab work done on 7/17, sodium was 124 at that time, pt states he had been instructed to drink plain water and had sodium rechecked today. Reports some fatigue, no other new symptoms.

## 2016-09-26 NOTE — ED Notes (Signed)
Pt states he has been weak and low energy for a few days, his sodium is low from triage blood draw

## 2016-09-26 NOTE — ED Provider Notes (Signed)
[facility] DEPT Provider Note   CSN: [id number] Arrival date & time: 09/26/16  1708     History   Chief Complaint Chief Complaint  Patient presents with  . Hyponatremia    HPI Cody Mendoza is a 83 y.o. male.  HPI 83 year old male with a history of hypertension presents to the emergency department after being sent from her primary care provider for hyponatremia. Patient reports that he got screening labs 4 days ago for an annual checkup with a noted that his sodium was 124. He reports that he believes his PCP told him to drink lots of water, but states that he might have got this wrong. His PCP saw him today and recheck the sodium noting that it was 127. Patient denied any nausea, vomiting, confusion, seizures, dizziness. Denies any chest pain, shortness of breath. Denies any recent fevers, illnesses, infections.   Past Medical History:  Diagnosis Date  . AF (atrial fibrillation) ([place])   . Asthma   . Detached retina   . Diverticulosis   . GERD (gastroesophageal reflux disease)   . [name] syndrome   . Hypertension   . Mitral insufficiency   . Renal calculi     Patient Active Problem List   Diagnosis Date Noted  . Puncture wound of right lower leg without foreign body 10/08/2013  . Mitral insufficiency 12/28/2010  . Atrial fibrillation ([place]) 11/16/2010  . Hypertension   . GERD 04/22/2010  . PERSONAL HX COLONIC POLYPS 04/22/2010    Past Surgical History:  Procedure Laterality Date  . cataract surgery    . ESOPHAGEAL DILATION     In the past  . FINGER TENDON REPAIR    . INGUINAL HERNIA REPAIR    . KIDNEY STONE SURGERY     Status post syrgical removal of a   . TONSILLECTOMY         Home Medications    Prior to Admission medications   Medication Sig Start Date End Date Taking? Authorizing Provider  dabigatran (PRADAXA) 150 MG CAPS Take 1 capsule (150 mg total) by mouth every 12 (twelve) hours. 01/18/12  Yes Martinique, Peter M, MD  doxazosin (CARDURA)  4 MG tablet Take 2 mg by mouth every evening.   Yes [provider]  losartan (COZAAR) 50 MG tablet Take 50 mg by mouth 2 (two) times daily.    Yes [provider]  Multiple Vitamin (MULTIVITAMIN WITH MINERALS) TABS Take 1 tablet by mouth daily before supper.    Yes [provider]  Multiple Vitamins-Minerals (ICAPS AREDS FORMULA PO) Take 1 tablet by mouth 2 (two) times daily.    Yes [provider]  pantoprazole (PROTONIX) 40 MG tablet Take 40 mg by mouth every morning.    Yes [provider]  Polyethyl Glycol-Propyl Glycol (SYSTANE) 0.4-0.3 % SOLN Place 1 application into the left eye daily. Apply to left eye as directed   Yes [provider]  vitamin B-12 (CYANOCOBALAMIN) 1000 MCG tablet Take 500 mcg by mouth daily.    Yes [provider]  vitamin C (ASCORBIC ACID) 500 MG tablet Take 500 mg by mouth daily before supper.    Yes [provider]    Family History Family History  Problem Relation Age of Onset  . Hypertension Mother     Social History Social History  Substance Use Topics  . Smoking status: Never Smoker  . Smokeless tobacco: Never Used  . Alcohol use 0.0 oz/week     Comment: rarely  Allergies   Patient has no known allergies.   Review of Systems Review of Systems  Constitutional: Negative for chills and fever.  HENT: Negative for ear pain and sore throat.   Eyes: Negative for pain and visual disturbance.  Respiratory: Negative for cough and shortness of breath.   Cardiovascular: Negative for chest pain and palpitations.  Gastrointestinal: Negative for abdominal pain and vomiting.  Genitourinary: Negative for dysuria and hematuria.  Musculoskeletal: Negative for arthralgias and back pain.  Skin: Negative for color change and rash.  Neurological: Negative for seizures and syncope.  All other systems reviewed and are negative.   Physical Exam Updated Vital Signs BP (!) 191/73   Pulse  63 Comment: Simultaneous filing. User may not have seen previous data.  Temp 98.1 F (36.7 C) (Oral)   Resp 16   SpO2 98% Comment: Simultaneous filing. User may not have seen previous data.  Physical Exam  Constitutional: He is oriented to person, place, and time. He appears well-developed and well-nourished. No distress.  HENT:  Head: Normocephalic and atraumatic.  Nose: Nose normal.  Eyes: Pupils are equal, round, and reactive to light. Conjunctivae and EOM are normal. Right eye exhibits no discharge. Left eye exhibits no discharge. No scleral icterus.  Neck: Normal range of motion. Neck supple.  Cardiovascular: Normal rate and regular rhythm.  Exam reveals no gallop and no friction rub.   No murmur heard. Pulmonary/Chest: Effort normal and breath sounds normal. No stridor. No respiratory distress. He has no rales.  Abdominal: Soft. He exhibits no distension. There is no tenderness.  Musculoskeletal: He exhibits no edema or tenderness.  Neurological: He is alert and oriented to person, place, and time.  Skin: Skin is warm and dry. No rash noted. He is not diaphoretic. No erythema.  Psychiatric: He has a normal mood and affect.  Vitals reviewed.    ED Treatments / Results  Labs (all labs ordered are listed, but only abnormal results are displayed) Labs Reviewed  COMPREHENSIVE METABOLIC PANEL - Abnormal; Notable for the following:       Result Value   Sodium 126 (*)    Chloride 90 (*)    Creatinine, Ser 1.27 (*)    Total Bilirubin 1.6 (*)    GFR calc non Af Amer 47 (*)    GFR calc Af Amer 55 (*)    All other components within normal limits  CBC - Abnormal; Notable for the following:    RBC 3.35 (*)    Hemoglobin 10.4 (*)    HCT 29.2 (*)    All other components within normal limits    EKG  EKG Interpretation None       Radiology No results found.  Procedures Procedures (including critical care time)  Medications Ordered in ED Medications  doxazosin (CARDURA)  tablet 2 mg (2 mg Oral Given 09/26/16 2316)  sodium chloride 0.9 % bolus 1,000 mL (0 mLs Intravenous Stopped 09/26/16 2213)  losartan (COZAAR) tablet 50 mg (50 mg Oral Given 09/26/16 2148)     Initial Impression / Assessment and Plan / ED Course  I have reviewed the triage vital signs and the nursing notes.  Pertinent labs & imaging results that were available during my care of the patient were reviewed by me and considered in my medical decision making (see chart for details).     Hyponatremia; asymptomatic. Improved since initial check last week. Since he is here, will give 1L IVF bolus. Close PCP follow up for recheck.  The  patient is safe for discharge with strict return precautions.   Final Clinical Impressions(s) / ED Diagnoses   Final diagnoses:  Hyponatremia   Disposition: Discharge  Condition: Good  I have discussed the results, Dx and Tx plan with the patient who expressed understanding and agree(s) with the plan. Discharge instructions discussed at great length. The patient was given strict return precautions who verbalized understanding of the instructions. No further questions at time of discharge.    New Prescriptions   No medications on file    Follow Up: Crist Infante, [address] [id number] [phone]  In 4 days For close follow up to assess to recheck sodium level      Kentrel Clevenger, Grayce Sessions, MD 09/26/16 2332

## 2016-09-26 NOTE — Discharge Instructions (Signed)
Limit your plain water intake. Drink sports drinks such as Gatorade. Follow up closely with your primary care provider to recheck your electrolytes. If you develop nausea, vomiting, dizziness, confusion, seizures, please return to the emergency department.

## 2016-09-30 DIAGNOSIS — R3121 Asymptomatic microscopic hematuria: Secondary | ICD-10-CM | POA: Insufficient documentation

## 2016-09-30 DIAGNOSIS — E871 Hypo-osmolality and hyponatremia: Secondary | ICD-10-CM | POA: Insufficient documentation

## 2017-01-25 DIAGNOSIS — E739 Lactose intolerance, unspecified: Secondary | ICD-10-CM | POA: Insufficient documentation

## 2017-06-16 DIAGNOSIS — N3281 Overactive bladder: Secondary | ICD-10-CM | POA: Insufficient documentation

## 2017-09-04 ENCOUNTER — Encounter: Payer: Self-pay | Admitting: Cardiology

## 2017-09-12 NOTE — Progress Notes (Signed)
Cody Mendoza Date of Birth: Dec 21, 1921   History of Present Illness: Cody Mendoza is seen today for followup of his chronic atrial fibrillation. He is on no rate control. He is anticoagulated with Pradaxa. He reports he is doing well this past year.  He denies any symptoms of chest pain, shortness of breath.  He stays very active  doing yard work. Stays at home mostly. He does note his legs fatigue easily. He brings extensive BP readings from home ranging from 86767-209 systolic. Rarely gets readings up in the 150s. Was upset today doing computer work and was late for his appointment.     Current Outpatient Medications on File Prior to Visit  Medication Sig Dispense Refill  . dabigatran (PRADAXA) 150 MG CAPS Take 1 capsule (150 mg total) by mouth every 12 (twelve) hours. 60 capsule 5  . doxazosin (CARDURA) 4 MG tablet Take 2 mg by mouth every evening.    . Lactobacillus (PROBIOTIC ACIDOPHILUS PO) Take by mouth.    . latanoprost (XALATAN) 0.005 % ophthalmic solution INT 1 GTT IN OU HS  11  . losartan (COZAAR) 50 MG tablet Take 50 mg by mouth daily.     . Multiple Vitamin (MULTIVITAMIN WITH MINERALS) TABS Take 1 tablet by mouth daily before supper.     . Multiple Vitamins-Minerals (ICAPS AREDS FORMULA PO) Take 1 tablet by mouth 2 (two) times daily.     . pantoprazole (PROTONIX) 40 MG tablet Take 40 mg by mouth every morning.     [name] Glycol-Propyl Glycol (SYSTANE) 0.4-0.3 % SOLN Place 1 application into the left eye daily. Apply to left eye as directed    . vitamin B-12 (CYANOCOBALAMIN) 1000 MCG tablet Take 500 mcg by mouth daily.     . vitamin C (ASCORBIC ACID) 500 MG tablet Take 500 mg by mouth daily before supper.      No current facility-administered medications on file prior to visit.     No Known Allergies  Past Medical History:  Diagnosis Date  . AF (atrial fibrillation) ([place])   . Asthma   . Detached retina   . Diverticulosis   . GERD (gastroesophageal reflux  disease)   . [name] syndrome   . Hypertension   . Mitral insufficiency   . Renal calculi     Past Surgical History:  Procedure Laterality Date  . cataract surgery    . ESOPHAGEAL DILATION     In the past  . FINGER TENDON REPAIR    . INGUINAL HERNIA REPAIR    . KIDNEY STONE SURGERY     Status post syrgical removal of a   . TONSILLECTOMY      Social History   Tobacco Use  Smoking Status Never Smoker  Smokeless Tobacco Never Used    Social History   Substance and Sexual Activity  Alcohol Use Yes  . Alcohol/week: 0.0 oz   Comment: rarely    Family History  Problem Relation Age of Onset  . Hypertension Mother     Review of Systems: As noted in history of present illness. All other systems were reviewed and are negative.  Physical Exam: BP (!) 198/82   Pulse 71   Ht 5\' 6"  (1.676 m)   Wt 147 lb (66.7 kg)   BMI 23.73 kg/m  GENERAL:  Well appearing, elderly WM in NAD HEENT:  PERRL, EOMI, sclera are clear. Oropharynx is clear. NECK:  No jugular venous distention, carotid upstroke brisk and symmetric, no bruits, no thyromegaly  or adenopathy LUNGS:  Clear to auscultation bilaterally CHEST:  Unremarkable HEART:  RRR,  PMI not displaced or sustained,S1 and S2 within normal limits, no S3, no S4: no clicks, no rubs, no murmurs ABD:  Soft, nontender. BS +, no masses or bruits. No hepatomegaly, no splenomegaly EXT:  2 + pulses throughout, no edema, no cyanosis no clubbing SKIN:  Warm and dry.  No rashes NEURO:  Alert and oriented x 3. Cranial nerves II through XII intact. PSYCH:  Cognitively intact    LABORATORY DATA: Lab Results  Component Value Date   WBC 6.9 09/26/2016   HGB 10.4 (L) 09/26/2016   HCT 29.2 (L) 09/26/2016   PLT 197 09/26/2016   GLUCOSE 98 09/26/2016   ALT 22 09/26/2016   AST 33 09/26/2016   NA 126 (L) 09/26/2016   K 4.2 09/26/2016   CL 90 (L) 09/26/2016   CREATININE 1.27 (H) 09/26/2016   BUN 19 09/26/2016   CO2 26 09/26/2016   INR 1.61  (H) 04/24/2012   Labs dated 08/05/15: cholesterol 141, triglycerides 31, LDL 84, HDL 51. Chemistries, TSH, Hct normal Dated 11/13/15: BUN 17, creatinine 1.1. Dated 7/19/118: cholesterol 132, triglycerides 28, HDL 57, LDL 69.  Dated 01/25/17: Hgb 11.1, creatinine 1.2. TSH normal.  Ecg today shows Afib with rate 71. occ PVC. Old inferior infarct. I have personally reviewed and interpreted this study.   Assessment / Plan: 1. Atrial fibrillation. Chronic, persistent. Rate well controlled on no AV nodal blockers. Continue Pradaxa.  2. HTN. BP is quite elevated today but extensive readings from home acceptable. Will check BP at home later. If still high take an extra losartan.   3. Moderate mitral insufficiency.   We will continue his current therapy and follow up in one year.

## 2017-09-13 ENCOUNTER — Ambulatory Visit (INDEPENDENT_AMBULATORY_CARE_PROVIDER_SITE_OTHER): Payer: Medicare Other | Admitting: Cardiology

## 2017-09-13 ENCOUNTER — Encounter: Payer: Self-pay | Admitting: Cardiology

## 2017-09-13 VITALS — BP 198/82 | HR 71 | Ht 66.0 in | Wt 147.0 lb

## 2017-09-13 DIAGNOSIS — I482 Chronic atrial fibrillation, unspecified: Secondary | ICD-10-CM

## 2017-09-13 DIAGNOSIS — I10 Essential (primary) hypertension: Secondary | ICD-10-CM | POA: Diagnosis not present

## 2017-09-13 NOTE — Patient Instructions (Signed)
Continue your current therapy  I will see you in one  Year.

## 2017-09-18 DIAGNOSIS — L570 Actinic keratosis: Secondary | ICD-10-CM | POA: Insufficient documentation

## 2017-11-10 DIAGNOSIS — R269 Unspecified abnormalities of gait and mobility: Secondary | ICD-10-CM | POA: Insufficient documentation

## 2017-11-10 DIAGNOSIS — R972 Elevated prostate specific antigen [PSA]: Secondary | ICD-10-CM | POA: Insufficient documentation

## 2017-12-06 DIAGNOSIS — J069 Acute upper respiratory infection, unspecified: Secondary | ICD-10-CM | POA: Insufficient documentation

## 2017-12-18 DIAGNOSIS — R059 Cough, unspecified: Secondary | ICD-10-CM | POA: Insufficient documentation

## 2017-12-18 DIAGNOSIS — J189 Pneumonia, unspecified organism: Secondary | ICD-10-CM | POA: Insufficient documentation

## 2018-03-21 ENCOUNTER — Encounter (HOSPITAL_COMMUNITY): Payer: Self-pay

## 2018-03-21 ENCOUNTER — Emergency Department (HOSPITAL_COMMUNITY): Payer: Medicare Other

## 2018-03-21 ENCOUNTER — Emergency Department (HOSPITAL_COMMUNITY)
Admission: EM | Admit: 2018-03-21 | Discharge: 2018-03-22 | Disposition: A | Discharge status: Home / Self Care | Payer: Medicare Other | Attending: Emergency Medicine | Admitting: Emergency Medicine

## 2018-03-21 DIAGNOSIS — Z79899 Other long term (current) drug therapy: Secondary | ICD-10-CM | POA: Insufficient documentation

## 2018-03-21 DIAGNOSIS — W108XXA Fall (on) (from) other stairs and steps, initial encounter: Secondary | ICD-10-CM | POA: Insufficient documentation

## 2018-03-21 DIAGNOSIS — I10 Essential (primary) hypertension: Secondary | ICD-10-CM | POA: Diagnosis not present

## 2018-03-21 DIAGNOSIS — M47812 Spondylosis without myelopathy or radiculopathy, cervical region: Secondary | ICD-10-CM | POA: Diagnosis not present

## 2018-03-21 DIAGNOSIS — Y929 Unspecified place or not applicable: Secondary | ICD-10-CM | POA: Diagnosis not present

## 2018-03-21 DIAGNOSIS — S0990XA Unspecified injury of head, initial encounter: Secondary | ICD-10-CM

## 2018-03-21 DIAGNOSIS — Y999 Unspecified external cause status: Secondary | ICD-10-CM | POA: Insufficient documentation

## 2018-03-21 DIAGNOSIS — W01198A Fall on same level from slipping, tripping and stumbling with subsequent striking against other object, initial encounter: Secondary | ICD-10-CM | POA: Insufficient documentation

## 2018-03-21 DIAGNOSIS — J45909 Unspecified asthma, uncomplicated: Secondary | ICD-10-CM | POA: Diagnosis not present

## 2018-03-21 DIAGNOSIS — Y9389 Activity, other specified: Secondary | ICD-10-CM | POA: Diagnosis not present

## 2018-03-21 DIAGNOSIS — W19XXXA Unspecified fall, initial encounter: Secondary | ICD-10-CM

## 2018-03-21 LAB — URINALYSIS, ROUTINE W REFLEX MICROSCOPIC
BILIRUBIN URINE: NEGATIVE
Bacteria, UA: NONE SEEN
Glucose, UA: NEGATIVE mg/dL
Ketones, ur: 5 mg/dL — AB
Leukocytes, UA: NEGATIVE
Nitrite: NEGATIVE
Protein, ur: NEGATIVE mg/dL
SPECIFIC GRAVITY, URINE: 1.008 (ref 1.005–1.030)
pH: 6 (ref 5.0–8.0)

## 2018-03-21 MED ORDER — TETANUS-DIPHTH-ACELL PERTUSSIS 5-2.5-18.5 LF-MCG/0.5 IM SUSP: 0.5000 mL | Freq: Once | INTRAMUSCULAR | Status: DC

## 2018-03-21 MED ORDER — TETANUS-DIPHTHERIA TOXOIDS TD 5-2 LFU IM INJ
0.5000 mL | INJECTION | Freq: Once | INTRAMUSCULAR | Status: DC
  Administered 2018-03-21: 0.5 mL via INTRAMUSCULAR
  Filled 2018-03-21: qty 0.5

## 2018-03-21 MED ORDER — LOSARTAN POTASSIUM 50 MG PO TABS
50.0000 mg | ORAL_TABLET | Freq: Once | ORAL | Status: AC
  Administered 2018-03-21: 50 mg via ORAL
  Filled 2018-03-21: qty 1

## 2018-03-21 NOTE — Discharge Instructions (Addendum)
You have been seen today for head injury.  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  There are also some small compression fractures T1 and T2, in your upper back.  To help your discomfort use ice on the sore areas 3 or 4 times a day for 2 days, after that use heat.  Please read and follow all provided instructions.   1. Medications: Continue usual home medications. Can take tylenol for pain. Please follow the directions on the box. 2. Treatment: rest, drink plenty of fluids 3. Follow Up: Please follow up with your primary doctor in 2-5 days for discussion of your diagnoses and further evaluation after today's visit; Please return to the ER for any new or worsening symptoms.  Return to the emergency room for worsening condition or new concerning symptoms.  ?  You should return to the ER if you develop severe or worsening symptoms.    HEAD INJURY If any of the following occur notify your physician or go to the Hospital Emergency Department:  Increased drowsiness, stupor or loss of consciousness  Restlessness or convulsions (fits)  Paralysis in arms or legs  Temperature above 100 F  Vomiting  Severe headache  Blood or clear fluid dripping from the nose or ears  Stiffness of the neck  Dizziness or blurred vision  Pulsating pain in the eye  Unequal pupils of eye  Personality changes  Any other unusual symptoms PRECAUTIONS  Keep head elevated at all times for the first 24 hours (Elevate mattress if pillow is ineffective)  Do not take tranquilizers, sedatives, narcotics or alcohol  Avoid aspirin. Use only acetaminophen (e.g. Tylenol) or ibuprofen (e.g. Advil) for relief of pain. Follow directions on the bottle for dosage.  Use ice packs for comfort  Use medications only as directed by your physician

## 2018-03-21 NOTE — ED Notes (Signed)
Pt given TENIVAC that was sent by pharmacy prior to Tdap order showing up. RN verified pt by name, armband, MRN and birthday before administering medication in right deltoid. Pt was en route to CT at time of med administration and therefore was not scanned.

## 2018-03-21 NOTE — ED Triage Notes (Signed)
Patient complains of head laceration/possible loc after slipping and falling backwards on steps today around 4pm. States that he remembers little about the event. Dressing applied, minimal bleeding, takes blood thinner

## 2018-03-21 NOTE — ED Provider Notes (Signed)
  Face-to-face evaluation   History: Resents for evaluation of fall, he was carrying laundry back into his house when he fell backwards striking his head.  He was able to get up and gather close and walk inside.  Later he came here because his wife noticed an injury to his head.  Denies prodrome or pre-existing illnesses.  Last tetanus booster 2013.  He injured his head and left neck when he fell.  Extremity pain.  Physical exam: Elderly male.  No respiratory distress.  Heart regular rate and rhythm no murmur lungs clear.  Chest nontender to palpation.  Extremities normal range of motion.  Neck tender left lateral with splinting to avoid movement.  Scalp with occiput contusion and small abrasion.  Clinical Course as of Mar 21 2344  Wed Mar 21, 2018  2343 Normal except small amount of blood and ketones present  Urinalysis, Routine w reflex microscopic(!) [EW]  2344 Degenerative change C1 and C2, left greater than right.  Endplate compression fractures T1-T2.  Images reviewed by me  CT Cervical Spine Wo Contrast [EW]  4034 No intracranial injury, images reviewed by me  CT HEAD WO CONTRAST [EW]    Clinical Course User Index [EW] Daleen Bo, MD    Medical screening examination/treatment/procedure(s) were conducted as a shared visit with non-physician practitioner(s) and myself.  I personally evaluated the patient during the encounter    Daleen Bo, MD 03/21/18 2348

## 2018-03-21 NOTE — ED Provider Notes (Addendum)
[facility] EMERGENCY DEPARTMENT Provider Note   CSN: [id number] Arrival date & time: 03/21/18  1814     History   Chief Complaint No chief complaint on file.   HPI Cody Mendoza is a 85 y.o. male with a history of A. fib on Pradaxa, hypertension, detached retina presenting to the emergency department today with chief complaint of fall.  He reports falling approximately 5 hours ago.  He was walking up the outdoor steps holding a full laundry basket and lost his footing causing him to fall backwards and strike his head on the brick.  He estimates that he fell down 3 steps. Because he was holding the laundry basket he was unable to grab the railing to catch himself because he was holding the basket. He was able to get up on his own and walk into the house.  He reports feeling tired afterwards so he laid down in bed. When his wife woke him up she noticed there was blood on the pillowcase.   He denies LOC, visual changes, headache, nausea, vomiting    Past Medical History:  Diagnosis Date  . AF (atrial fibrillation) ([place])   . Asthma   . Detached retina   . Diverticulosis   . GERD (gastroesophageal reflux disease)   . [name] syndrome   . Hypertension   . Mitral insufficiency   . Renal calculi     Patient Active Problem List   Diagnosis Date Noted  . Puncture wound of right lower leg without foreign body 10/08/2013  . Mitral insufficiency 12/28/2010  . Atrial fibrillation ([place]) 11/16/2010  . Hypertension   . GERD 04/22/2010  . PERSONAL HX COLONIC POLYPS 04/22/2010    Past Surgical History:  Procedure Laterality Date  . cataract surgery    . ESOPHAGEAL DILATION     In the past  . FINGER TENDON REPAIR    . INGUINAL HERNIA REPAIR    . KIDNEY STONE SURGERY     Status post syrgical removal of a   . TONSILLECTOMY          Home Medications    Prior to Admission medications   Medication Sig Start Date End Date Taking? Authorizing Provider    Cyanocobalamin (VITAMIN B12) 500 MCG TABS Take 500 mcg by mouth daily.   Yes [provider]  dabigatran (PRADAXA) 150 MG CAPS Take 1 capsule (150 mg total) by mouth every 12 (twelve) hours. 01/18/12  Yes [place], Peter M, MD  doxazosin (CARDURA) 4 MG tablet Take 2 mg by mouth every other day.    Yes [provider]  Lactobacillus (PROBIOTIC ACIDOPHILUS PO) Take 1 capsule by mouth daily.    Yes [provider]  latanoprost (XALATAN) 0.005 % ophthalmic solution Place 1 drop into both eyes at bedtime.  09/05/17  Yes [provider]  losartan (COZAAR) 100 MG tablet Take 50 mg by mouth at bedtime.    Yes [provider]  Multiple Vitamin (MULTIVITAMIN WITH MINERALS) TABS Take 1 tablet by mouth daily before supper.    Yes [provider]  Multiple Vitamins-Minerals (ICAPS AREDS FORMULA PO) Take 1 capsule by mouth 2 (two) times daily.    Yes [provider]  pantoprazole (PROTONIX) 40 MG tablet Take 40 mg by mouth every morning.    Yes [provider]  Polyethyl Glycol-Propyl Glycol (SYSTANE) 0.4-0.3 % SOLN Place 1 drop into both eyes at bedtime.    Yes [provider]  vitamin C (ASCORBIC ACID) 500  MG tablet Take 500 mg by mouth daily before supper.    Yes [provider]    Family History Family History  Problem Relation Age of Onset  . Hypertension Mother     Social History Social History   Tobacco Use  . Smoking status: Never Smoker  . Smokeless tobacco: Never Used  Substance Use Topics  . Alcohol use: Yes    Alcohol/week: 0.0 standard drinks    Comment: rarely  . Drug use: Never     Allergies   Patient has no known allergies.   Review of Systems Review of Systems  Constitutional: Negative for chills and fever.  HENT: Negative for congestion, ear discharge, ear pain, sinus pain and tinnitus.   Eyes: Negative for pain, discharge and visual disturbance.  Respiratory: Negative for chest  tightness and shortness of breath.   Cardiovascular: Negative for chest pain and palpitations.  Gastrointestinal: Negative for abdominal pain, nausea and vomiting.  Musculoskeletal: Positive for neck pain. Negative for arthralgias, back pain and gait problem.  Skin: Positive for wound. Negative for rash.  Neurological: Negative for dizziness, syncope, speech difficulty, weakness and headaches.  Psychiatric/Behavioral: Negative for confusion.     Physical Exam Updated Vital Signs BP (!) 181/78 (BP Location: Right Arm)   Pulse 81   Temp 97.8 F (36.6 C) (Oral)   Resp 20   SpO2 100%   Physical Exam Vitals signs and nursing note reviewed.  Constitutional:      Appearance: He is not ill-appearing or toxic-appearing.  HENT:     Head: Normocephalic and atraumatic.     Comments: Tender to palpation over right occipital scalp. 2 cm x 3 cm hematoma with macerated skin. No laceration seen.    Right Ear: Tympanic membrane normal.     Left Ear: Tympanic membrane normal.     Nose: Nose normal.     Mouth/Throat:     Mouth: Mucous membranes are moist.     Pharynx: Oropharynx is clear.  Eyes:     General: No scleral icterus.       Right eye: No discharge.        Left eye: No discharge.     Conjunctiva/sclera: Conjunctivae normal.  Neck:     Comments: Decrease ROM due to pain Cardiovascular:     Rate and Rhythm: Normal rate and regular rhythm.     Pulses: Normal pulses.     Heart sounds: Normal heart sounds.  Pulmonary:     Effort: Pulmonary effort is normal.     Breath sounds: Normal breath sounds.  Abdominal:     General: There is no distension.     Palpations: Abdomen is soft.     Tenderness: There is no abdominal tenderness. There is no guarding or rebound.  Musculoskeletal: Normal range of motion.  Skin:    General: Skin is warm and dry.     Capillary Refill: Capillary refill takes less than 2 seconds.  Neurological:     Mental Status: He is alert and oriented to person,  place, and time. Mental status is at baseline.     Motor: No weakness.     Comments: Speech is clear and goal oriented, follows commands CN III-XII intact, no facial droop Normal strength in upper and lower extremities bilaterally including dorsiflexion and plantar flexion, strong and equal grip strength Sensation normal to light and sharp touch Moves extremities without ataxia, coordination intact Normal finger to nose and rapid alternating movements Normal gait and balance  Psychiatric:        Behavior: Behavior normal.      ED Treatments / Results  Labs (all labs ordered are listed, but only abnormal results are displayed) Labs Reviewed  URINALYSIS, ROUTINE W REFLEX MICROSCOPIC - Abnormal; Notable for the following components:      Result Value   Color, Urine STRAW (*)    Hgb urine dipstick SMALL (*)    Ketones, ur 5 (*)    All other components within normal limits    EKG None  Radiology No results found.  Procedures Procedures (including critical care time)  Medications Ordered in ED Medications  losartan (COZAAR) tablet 50 mg (50 mg Oral Given 03/21/18 2209)     Initial Impression / Assessment and Plan / ED Course  I have reviewed the triage vital signs and the nursing notes.  Pertinent labs & imaging results that were available during my care of the patient were reviewed by me and considered in my medical decision making (see chart for details).  Clinical Course as of Mar 26 2047  Wed Mar 21, 2018  2343 Normal except small amount of blood and ketones present  Urinalysis, Routine w reflex microscopic(!) [EW]  2344 Degenerative change C1 and C2, left greater than right.  Endplate compression fractures T1-T2.  Images reviewed by me  CT Cervical Spine Wo Contrast [EW]  7867 No intracranial injury, images reviewed by me  CT HEAD WO CONTRAST [EW]    Clinical Course User Index [EW] Daleen Bo, MD  I viewed pt's head CT and agree with radiologist findings.  Pt's head CT is negative for acute intracranial abnormalities and small right occipital scalp hematoma without evidence of fracture. Bleeding is controlled with pressure and the skin is macerated without laceration or open wound needing closure. Dressed with nonstick gauze and pressure dressing.  UA to look for urinary tract infection as possible cause of weakness relating to his fall. UAwith ketones otherwise unremarkable. CT cervical spine pending.  Pt's last tetanus was in 2013, TDap given.  Pt had multiple elevated blood pressures. His home medication for hypertension is Losartan at bedtime and he has not yet taken it tonight. Pt given home dose of Losartan while here.  No signs of hypertensive urgency.  Discussed with patient and his daughter the need for close follow-up and management by their primary care physician.   The patient was discussed with and seen by Dr. Eulis Foster who agrees with the treatment plan.  At shift change care was transferred to Dr. Eulis Foster who will follow pending studies, re-evaulate and determine disposition.  If his CT cervical spine is negative I suspect pt will be discharged home.    Vitals:   03/21/18 2118 03/21/18 2130 03/21/18 2145 03/21/18 2354  BP: (!) 194/110 (!) 186/107 (!) 194/91 (!) 181/78  Pulse: 88 87 86 81  Resp:  20 20 20   Temp:      TempSrc:      SpO2: 97% 99% 99% 100%     Final Clinical Impressions(s) / ED Diagnoses   Final diagnoses:  Fall, initial encounter  Injury of head, initial encounter  Spondylosis of cervical region without myelopathy or radiculopathy    ED Discharge Orders    None       Cherre Robins, PA-C 03/21/18 2320    Herman Fiero, Harley Hallmark, PA-C 03/25/18 2050    Daleen Bo, MD 03/27/18 1352

## 2018-03-22 NOTE — ED Notes (Signed)
Patient verbalizes understanding of medications and discharge instructions. No further questions at this time. VSS and patient ambulatory at discharge.   

## 2018-06-08 ENCOUNTER — Emergency Department (HOSPITAL_COMMUNITY): Payer: Medicare Other

## 2018-06-08 ENCOUNTER — Other Ambulatory Visit: Payer: Self-pay

## 2018-06-08 ENCOUNTER — Encounter (HOSPITAL_COMMUNITY): Payer: Self-pay

## 2018-06-08 ENCOUNTER — Emergency Department (HOSPITAL_COMMUNITY)
Admission: EM | Admit: 2018-06-08 | Discharge: 2018-06-08 | Disposition: A | Discharge status: Home / Self Care | Payer: Medicare Other | Attending: Emergency Medicine | Admitting: Emergency Medicine

## 2018-06-08 DIAGNOSIS — R42 Dizziness and giddiness: Secondary | ICD-10-CM | POA: Diagnosis present

## 2018-06-08 DIAGNOSIS — Z7982 Long term (current) use of aspirin: Secondary | ICD-10-CM | POA: Insufficient documentation

## 2018-06-08 DIAGNOSIS — J45909 Unspecified asthma, uncomplicated: Secondary | ICD-10-CM | POA: Diagnosis not present

## 2018-06-08 DIAGNOSIS — W19XXXA Unspecified fall, initial encounter: Secondary | ICD-10-CM

## 2018-06-08 DIAGNOSIS — Z79899 Other long term (current) drug therapy: Secondary | ICD-10-CM | POA: Diagnosis not present

## 2018-06-08 DIAGNOSIS — I10 Essential (primary) hypertension: Secondary | ICD-10-CM | POA: Diagnosis not present

## 2018-06-08 DIAGNOSIS — Y92009 Unspecified place in unspecified non-institutional (private) residence as the place of occurrence of the external cause: Secondary | ICD-10-CM

## 2018-06-08 DIAGNOSIS — R55 Syncope and collapse: Secondary | ICD-10-CM

## 2018-06-08 LAB — RAPID URINE DRUG SCREEN, HOSP PERFORMED
Amphetamines: NOT DETECTED
Barbiturates: NOT DETECTED
Benzodiazepines: NOT DETECTED
Cocaine: NOT DETECTED
Opiates: NOT DETECTED
Tetrahydrocannabinol: NOT DETECTED

## 2018-06-08 LAB — COMPREHENSIVE METABOLIC PANEL
ALT: 18 U/L (ref 0–44)
AST: 32 U/L (ref 15–41)
Albumin: 3.9 g/dL (ref 3.5–5.0)
Alkaline Phosphatase: 96 U/L (ref 38–126)
Anion gap: 8 (ref 5–15)
BUN: 30 mg/dL — ABNORMAL HIGH (ref 8–23)
CO2: 24 mmol/L (ref 22–32)
Calcium: 9.2 mg/dL (ref 8.9–10.3)
Chloride: 98 mmol/L (ref 98–111)
Creatinine, Ser: 1.32 mg/dL — ABNORMAL HIGH (ref 0.61–1.24)
GFR calc Af Amer: 53 mL/min — ABNORMAL LOW (ref >60)
GFR calc non Af Amer: 46 mL/min — ABNORMAL LOW (ref >60)
Glucose, Bld: 93 mg/dL (ref 70–99)
Potassium: 4.1 mmol/L (ref 3.5–5.1)
Sodium: 130 mmol/L — ABNORMAL LOW (ref 135–145)
Total Bilirubin: 1 mg/dL (ref 0.3–1.2)
Total Protein: 6.8 g/dL (ref 6.5–8.1)

## 2018-06-08 LAB — CBC
HCT: 32.9 % — ABNORMAL LOW (ref 39.0–52.0)
Hemoglobin: 11.1 g/dL — ABNORMAL LOW (ref 13.0–17.0)
MCH: 31.3 pg (ref 26.0–34.0)
MCHC: 33.7 g/dL (ref 30.0–36.0)
MCV: 92.7 fL (ref 80.0–100.0)
Platelets: 197 10*3/uL (ref 150–400)
RBC: 3.55 MIL/uL — ABNORMAL LOW (ref 4.22–5.81)
RDW: 12.3 % (ref 11.5–15.5)
WBC: 6.3 10*3/uL (ref 4.0–10.5)
nRBC: 0 % (ref 0.0–0.2)

## 2018-06-08 LAB — DIFFERENTIAL
Abs Immature Granulocytes: 0.02 10*3/uL (ref 0.00–0.07)
Basophils Absolute: 0 10*3/uL (ref 0.0–0.1)
Basophils Relative: 1 %
Eosinophils Absolute: 0.1 10*3/uL (ref 0.0–0.5)
Eosinophils Relative: 2 %
Immature Granulocytes: 0 %
Lymphocytes Relative: 21 %
Lymphs Abs: 1.3 10*3/uL (ref 0.7–4.0)
Monocytes Absolute: 0.8 10*3/uL (ref 0.1–1.0)
Monocytes Relative: 12 %
Neutro Abs: 4.1 10*3/uL (ref 1.7–7.7)
Neutrophils Relative %: 64 %

## 2018-06-08 LAB — URINALYSIS, ROUTINE W REFLEX MICROSCOPIC
Bilirubin Urine: NEGATIVE
Glucose, UA: NEGATIVE mg/dL
Hgb urine dipstick: NEGATIVE
Ketones, ur: NEGATIVE mg/dL
Leukocytes,Ua: NEGATIVE
Nitrite: NEGATIVE
Protein, ur: NEGATIVE mg/dL
Specific Gravity, Urine: 1.015 (ref 1.005–1.030)
pH: 5 (ref 5.0–8.0)

## 2018-06-08 LAB — ETHANOL: Alcohol, Ethyl (B): <10 mg/dL (ref <10)

## 2018-06-08 LAB — PROTIME-INR
INR: 1.7 — ABNORMAL HIGH (ref 0.8–1.2)
Prothrombin Time: 19.9 seconds — ABNORMAL HIGH (ref 11.4–15.2)

## 2018-06-08 LAB — CBG MONITORING, ED: Glucose-Capillary: 80 mg/dL (ref 70–99)

## 2018-06-08 LAB — TROPONIN I: Troponin I: <0.03 ng/mL (ref <0.03)

## 2018-06-08 LAB — APTT: aPTT: 57 seconds — ABNORMAL HIGH (ref 24–36)

## 2018-06-08 LAB — I-STAT CREATININE, ED: Creatinine, Ser: 1.3 mg/dL — ABNORMAL HIGH (ref 0.61–1.24)

## 2018-06-08 MED ORDER — LACTATED RINGERS IV BOLUS
500.0000 mL | Freq: Once | INTRAVENOUS | Status: AC
  Administered 2018-06-08: 20:00:00 500 mL via INTRAVENOUS

## 2018-06-08 MED ORDER — LOSARTAN POTASSIUM 50 MG PO TABS
50.0000 mg | ORAL_TABLET | Freq: Once | ORAL | Status: AC
  Administered 2018-06-08: 21:00:00 50 mg via ORAL
  Filled 2018-06-08: qty 1

## 2018-06-08 MED ORDER — IOHEXOL 350 MG/ML SOLN
100.0000 mL | Freq: Once | INTRAVENOUS | Status: AC | PRN
  Administered 2018-06-08: 100 mL via INTRAVENOUS

## 2018-06-08 MED ORDER — ASPIRIN 81 MG PO CHEW: 81.0000 mg | CHEWABLE_TABLET | Freq: Every day | ORAL | 0 refills | Status: DC

## 2018-06-08 NOTE — ED Provider Notes (Signed)
[facility] DEPART[facility] Provider Note   CSN: [id number] Arrival date & time: 06/08/18  1706    History   Chief Complaint Chief Complaint  Patient presents with   Fall   Dizziness    HPI Cody Mendoza is a 85 y.o. male with history of atrial fibrillation, hypertension, and asthma who presents the emergency department after episode of lightheadedness and weakness in lower extremities causing ground-level fall.  Patient states he did not strike his head or lose consciousness during the event.  Patient was unable to get up from the fall in position secondary to weakness globally.  His daughter was able to come to his aid to sit him in a chair.  It was after this that patient's daughter noticed he was slurring his speech.  Patient daughter was concern for possible strokelike symptoms so she brought him to the emergency department for evaluation.  Prior to arrival in the ED the patient's slurred speech had resolved.      Illness  Severity:  Severe Onset quality:  Sudden Duration:  30 minutes Timing:  Constant Progression:  Resolved Chronicity:  New Associated symptoms: no abdominal pain, no chest pain, no cough, no ear pain, no fever, no rash, no shortness of breath, no sore throat and no vomiting     Past Medical History:  Diagnosis Date   AF (atrial fibrillation) (HCC)    Asthma    Detached retina    Diverticulosis    GERD (gastroesophageal reflux disease)    [name] syndrome    Hypertension    Mitral insufficiency    Renal calculi     Patient Active Problem List   Diagnosis Date Noted   Puncture wound of right lower leg without foreign body 10/08/2013   Mitral insufficiency 12/28/2010   Atrial fibrillation ([place]) 11/16/2010   Hypertension    GERD 04/22/2010   PERSONAL HX COLONIC POLYPS 04/22/2010    Past Surgical History:  Procedure Laterality Date   cataract surgery     ESOPHAGEAL DILATION     In the past    [place]     Status post syrgical removal of a    TONSILLECTOMY          Home Medications    Prior to Admission medications   Medication Sig Start Date End Date Taking? Authorizing Provider  aspirin 81 MG chewable tablet Chew 1 tablet (81 mg total) by mouth daily. 06/08/18   Tommie Raymond, MD  Cyanocobalamin (VITAMIN B12) 500 MCG TABS Take 500 mcg by mouth daily.    [provider]  dabigatran (PRADAXA) 150 MG CAPS Take 1 capsule (150 mg total) by mouth every 12 (twelve) hours. 01/18/12   Martinique, Peter M, MD  doxazosin (CARDURA) 4 MG tablet Take 2 mg by mouth every other day.     [provider]  Lactobacillus (PROBIOTIC ACIDOPHILUS PO) Take 1 capsule by mouth daily.     [provider]  latanoprost (XALATAN) 0.005 % ophthalmic solution Place 1 drop into both eyes at bedtime.  09/05/17   [provider]  losartan (COZAAR) 100 MG tablet Take 50 mg by mouth at bedtime.     [provider]  Multiple Vitamin (MULTIVITAMIN WITH MINERALS) TABS Take 1 tablet by mouth daily before supper.     [provider]  Multiple Vitamins-Minerals (ICAPS AREDS FORMULA PO) Take 1 capsule by mouth 2 (  two) times daily.     [provider]  pantoprazole (PROTONIX) 40 MG tablet Take 40 mg by mouth every morning.     [provider]  Polyethyl Glycol-Propyl Glycol (SYSTANE) 0.4-0.3 % SOLN Place 1 drop into both eyes at bedtime.     [provider]  vitamin C (ASCORBIC ACID) 500 MG tablet Take 500 mg by mouth daily before supper.     [provider]    Family History Family History  Problem Relation Age of Onset   Hypertension Mother     Social History Social History   Tobacco Use   Smoking status: Never Smoker   Smokeless tobacco: Never Used  Substance Use Topics   Alcohol use: Yes    Alcohol/week: 0.0 standard drinks    Comment: rarely   Drug  use: Never     Allergies   Patient has no known allergies.   Review of Systems Review of Systems  Constitutional: Negative for chills and fever.  HENT: Negative for ear pain and sore throat.   Eyes: Negative for pain and visual disturbance.  Respiratory: Negative for cough and shortness of breath.   Cardiovascular: Negative for chest pain and palpitations.  Gastrointestinal: Negative for abdominal pain and vomiting.  Genitourinary: Negative for dysuria and hematuria.  Musculoskeletal: Negative for arthralgias and back pain.  Skin: Negative for color change and rash.  Neurological: Positive for dizziness and weakness (Bilateral LE). Negative for seizures and syncope.       Dysarthria  All other systems reviewed and are negative.    Physical Exam Updated Vital Signs BP (!) 196/83    Pulse (!) 108    Temp 98.9 F (37.2 C) (Oral)    Resp 15    SpO2 96%   Physical Exam Vitals signs and nursing note reviewed.  Constitutional:      Appearance: He is well-developed.  HENT:     Head: Normocephalic and atraumatic.  Eyes:     Conjunctiva/sclera: Conjunctivae normal.  Neck:     Musculoskeletal: Neck supple.  Cardiovascular:     Rate and Rhythm: Normal rate. Rhythm irregular.     Heart sounds: No murmur.  Pulmonary:     Effort: Pulmonary effort is normal. No respiratory distress.     Breath sounds: Normal breath sounds.  Abdominal:     Palpations: Abdomen is soft.     Tenderness: There is no abdominal tenderness.  Musculoskeletal: Normal range of motion.     Right lower leg: No edema.     Left lower leg: No edema.  Skin:    General: Skin is warm and dry.  Neurological:     Mental Status: He is alert.     Comments: Cranial nerves II through XII intact.  5 out of 5 strength in bilateral upper and lower extremities.  Normal finger-to-nose.  Negative pronator drift.  No evidence of expressive aphasia.      ED Treatments / Results  Labs (all labs ordered are listed, but  only abnormal results are displayed) Labs Reviewed  PROTIME-INR - Abnormal; Notable for the following components:      Result Value   Prothrombin Time 19.9 (*)    INR 1.7 (*)    All other components within normal limits  APTT - Abnormal; Notable for the following components:   aPTT 57 (*)    All other components within normal limits  CBC - Abnormal; Notable for the following components:   RBC 3.55 (*)  Hemoglobin 11.1 (*)    HCT 32.9 (*)    All other components within normal limits  COMPREHENSIVE METABOLIC PANEL - Abnormal; Notable for the following components:   Sodium 130 (*)    BUN 30 (*)    Creatinine, Ser 1.32 (*)    GFR calc non Af Amer 46 (*)    GFR calc Af Amer 53 (*)    All other components within normal limits  I-STAT CREATININE, ED - Abnormal; Notable for the following components:   Creatinine, Ser 1.30 (*)    All other components within normal limits  ETHANOL  DIFFERENTIAL  RAPID URINE DRUG SCREEN, HOSP PERFORMED  URINALYSIS, ROUTINE W REFLEX MICROSCOPIC  TROPONIN I  CBG MONITORING, ED    EKG EKG Interpretation  Date/Time:  Friday June 08 2018 17:19:38 EDT Ventricular Rate:  88 PR Interval:    QRS Duration: 88 QT Interval:  366 QTC Calculation: 446 R Axis:   -35 Text Interpretation:  Atrial fibrillation Left axis deviation Low voltage, precordial leads Consider anterior infarct No acute changes Confirmed by Merrily Pew [phone]) on 06/08/2018 6:40:06 PM   Radiology Ct Angio Head W Or Wo Contrast  Result Date: 06/08/2018 CLINICAL DATA:  Witnessed fall 1 hour ago. Dizziness before the fall. Unable to walk presently. Some speech disturbance. EXAM: CT ANGIOGRAPHY HEAD AND NECK TECHNIQUE: Multidetector CT imaging of the head and neck was performed using the standard protocol during bolus administration of intravenous contrast. Multiplanar CT image reconstructions and MIPs were obtained to evaluate the vascular anatomy. Carotid stenosis measurements (when  applicable) are obtained utilizing NASCET criteria, using the distal internal carotid diameter as the denominator. CONTRAST:  117mL OMNIPAQUE IOHEXOL 350 MG/ML SOLN COMPARISON:  Head CT same day FINDINGS: CTA NECK FINDINGS Aortic arch: Aortic atherosclerosis. No aneurysm or dissection seen. Branching pattern is normal. Right carotid system: Common carotid artery is widely patent to the bifurcation region. There is calcified plaque at the carotid bifurcation and ICA bulb but no stenosis. More distal cervical ICA is widely patent. Left carotid system: Common carotid artery is widely patent to the bifurcation region. There is calcified plaque at the bifurcation and ICA bulb but no stenosis. More distal cervical ICA is widely patent. Vertebral arteries: Calcified plaque at the right vertebral artery origin. 50% stenosis. Left vertebral artery origin is widely patent. Both vertebral arteries widely patent through the cervical region to the foramen magnum. Skeleton: Chronic cervical spondylosis.  No acute finding. Other neck: No mass or lymphadenopathy. Upper chest: Mild scarring at the lung apices. Review of the MIP images confirms the above findings CTA HEAD FINDINGS Anterior circulation: Both internal carotid arteries are patent through the skull base and siphon regions. There is siphon atherosclerotic calcification but no stenosis more than 30%. The anterior and middle cerebral vessels are patent without proximal stenosis, aneurysm or vascular malformation. No missing branch vessels are suspected. Posterior circulation: Both vertebral arteries are patent through the foramen magnum to the basilar. No basilar stenosis. Posterior circulation branch vessels are normal. Both PCAs receive there supply from the anterior circulation. Venous sinuses: Patent and normal. Anatomic variants: None Delayed phase: No abnormal enhancement Review of the MIP images confirms the above findings IMPRESSION: No large or medium vessel  occlusion. Calcified plaque at both carotid bifurcations but without stenosis. 50% stenosis of the right vertebral artery origin, but with wide patency beyond that. Somewhat diminutive posterior circulation due to fetal origin of both posterior cerebral arteries from the anterior circulation. Electronically Signed  By: Nelson Chimes M.D.   On: 06/08/2018 20:05   Ct Head Wo Contrast  Result Date: 06/08/2018 CLINICAL DATA:  85 year old male with altered mental status. EXAM: CT HEAD WITHOUT CONTRAST CT CERVICAL SPINE WITHOUT CONTRAST TECHNIQUE: Multidetector CT imaging of the head and cervical spine was performed following the standard protocol without intravenous contrast. Multiplanar CT image reconstructions of the cervical spine were also generated. COMPARISON:  CT of the head and cervical spine dated 03/21/2018 FINDINGS: CT HEAD FINDINGS Brain: There is moderate age-related atrophy and chronic microvascular ischemic changes. There is no acute intracranial hemorrhage. No mass effect or midline shift. No extra-axial fluid collection. Vascular: No hyperdense vessel or unexpected calcification. Skull: Normal. Negative for fracture or focal lesion. Sinuses/Orbits: Mild mucoperiosteal thickening of paranasal sinuses. No air-fluid levels. The mastoid air cells are clear. Other: None CT CERVICAL SPINE FINDINGS Alignment: No acute subluxation. Skull base and vertebrae: No acute fracture. Osteopenia. Soft tissues and spinal canal: No prevertebral fluid or swelling. No visible canal hematoma. Disc levels: Extensive multilevel degenerative changes. Severe degenerative changes of the left atlantoaxial joint. Multilevel facet arthropathy. Upper chest: Negative. Other: Bilateral carotid bulb calcified plaques. IMPRESSION: 1. No acute intracranial hemorrhage. 2. Age-related atrophy and chronic microvascular ischemic changes. 3. No acute/traumatic cervical spine pathology. Extensive degenerative changes. Electronically Signed    By: Anner Crete M.D.   On: 06/08/2018 18:21   Ct Angio Neck W And/or Wo Contrast  Result Date: 06/08/2018 CLINICAL DATA:  Witnessed fall 1 hour ago. Dizziness before the fall. Unable to walk presently. Some speech disturbance. EXAM: CT ANGIOGRAPHY HEAD AND NECK TECHNIQUE: Multidetector CT imaging of the head and neck was performed using the standard protocol during bolus administration of intravenous contrast. Multiplanar CT image reconstructions and MIPs were obtained to evaluate the vascular anatomy. Carotid stenosis measurements (when applicable) are obtained utilizing NASCET criteria, using the distal internal carotid diameter as the denominator. CONTRAST:  161mL OMNIPAQUE IOHEXOL 350 MG/ML SOLN COMPARISON:  Head CT same day FINDINGS: CTA NECK FINDINGS Aortic arch: Aortic atherosclerosis. No aneurysm or dissection seen. Branching pattern is normal. Right carotid system: Common carotid artery is widely patent to the bifurcation region. There is calcified plaque at the carotid bifurcation and ICA bulb but no stenosis. More distal cervical ICA is widely patent. Left carotid system: Common carotid artery is widely patent to the bifurcation region. There is calcified plaque at the bifurcation and ICA bulb but no stenosis. More distal cervical ICA is widely patent. Vertebral arteries: Calcified plaque at the right vertebral artery origin. 50% stenosis. Left vertebral artery origin is widely patent. Both vertebral arteries widely patent through the cervical region to the foramen magnum. Skeleton: Chronic cervical spondylosis.  No acute finding. Other neck: No mass or lymphadenopathy. Upper chest: Mild scarring at the lung apices. Review of the MIP images confirms the above findings CTA HEAD FINDINGS Anterior circulation: Both internal carotid arteries are patent through the skull base and siphon regions. There is siphon atherosclerotic calcification but no stenosis more than 30%. The anterior and middle  cerebral vessels are patent without proximal stenosis, aneurysm or vascular malformation. No missing branch vessels are suspected. Posterior circulation: Both vertebral arteries are patent through the foramen magnum to the basilar. No basilar stenosis. Posterior circulation branch vessels are normal. Both PCAs receive there supply from the anterior circulation. Venous sinuses: Patent and normal. Anatomic variants: None Delayed phase: No abnormal enhancement Review of the MIP images confirms the above findings IMPRESSION: No large or medium  vessel occlusion. Calcified plaque at both carotid bifurcations but without stenosis. 50% stenosis of the right vertebral artery origin, but with wide patency beyond that. Somewhat diminutive posterior circulation due to fetal origin of both posterior cerebral arteries from the anterior circulation. Electronically Signed   By: Nelson Chimes M.D.   On: 06/08/2018 20:05   Ct Cervical Spine Wo Contrast  Result Date: 06/08/2018 CLINICAL DATA:  85 year old male with altered mental status. EXAM: CT HEAD WITHOUT CONTRAST CT CERVICAL SPINE WITHOUT CONTRAST TECHNIQUE: Multidetector CT imaging of the head and cervical spine was performed following the standard protocol without intravenous contrast. Multiplanar CT image reconstructions of the cervical spine were also generated. COMPARISON:  CT of the head and cervical spine dated 03/21/2018 FINDINGS: CT HEAD FINDINGS Brain: There is moderate age-related atrophy and chronic microvascular ischemic changes. There is no acute intracranial hemorrhage. No mass effect or midline shift. No extra-axial fluid collection. Vascular: No hyperdense vessel or unexpected calcification. Skull: Normal. Negative for fracture or focal lesion. Sinuses/Orbits: Mild mucoperiosteal thickening of paranasal sinuses. No air-fluid levels. The mastoid air cells are clear. Other: None CT CERVICAL SPINE FINDINGS Alignment: No acute subluxation. Skull base and vertebrae:  No acute fracture. Osteopenia. Soft tissues and spinal canal: No prevertebral fluid or swelling. No visible canal hematoma. Disc levels: Extensive multilevel degenerative changes. Severe degenerative changes of the left atlantoaxial joint. Multilevel facet arthropathy. Upper chest: Negative. Other: Bilateral carotid bulb calcified plaques. IMPRESSION: 1. No acute intracranial hemorrhage. 2. Age-related atrophy and chronic microvascular ischemic changes. 3. No acute/traumatic cervical spine pathology. Extensive degenerative changes. Electronically Signed   By: Anner Crete M.D.   On: 06/08/2018 18:21   Dg Chest Portable 1 View  Result Date: 06/08/2018 CLINICAL DATA:  85 year old male with fall. EXAM: PORTABLE CHEST 1 VIEW COMPARISON:  Chest radiograph dated 03/02/2009 FINDINGS: Mild diffuse interstitial coarsening and chronic bronchitic changes. No focal consolidation, pleural effusion, or pneumothorax. There is mild cardiomegaly. Atherosclerotic calcification of the aortic arch. Ovoid retrocardiac density measuring 7.6 cm in diameter may represent aneurysmal dilatation of the aorta versus a hiatal hernia. Further evaluation with CT is recommended. No acute osseous pathology. IMPRESSION: 1. No acute cardiopulmonary process.  Chronic bronchitic changes. 2. Ovoid retrocardiac density may represent aneurysmal dilatation of the aorta versus a hiatal hernia. Further evaluation with chest CT recommended. Electronically Signed   By: Anner Crete M.D.   On: 06/08/2018 18:50    Procedures Procedures (including critical care time)  Medications Ordered in ED Medications  lactated ringers bolus 500 mL (0 mLs Intravenous Stopped 06/08/18 2112)  iohexol (OMNIPAQUE) 350 MG/ML injection 100 mL (100 mLs Intravenous Contrast Given 06/08/18 1941)  losartan (COZAAR) tablet 50 mg (50 mg Oral Given 06/08/18 2112)     Initial Impression / Assessment and Plan / ED Course  I have reviewed the triage vital signs and the  nursing notes.  Pertinent labs & imaging results that were available during my care of the patient were reviewed by me and considered in my medical decision making (see chart for details).        Patient is a 85 year old male with history of atrial fibrillation currently on anticoagulation who presents to the emergency department after he was burning sensitive documents outside of his home when he began to feel off balance and attempted to place his weight on a nearby trash can causing him to fall over.  Patient had episode of expressive aphasia noted by himself and his daughter concerning for possible strokelike  symptoms which is why they came to the emergency department.  On initial evaluation of the patient he was hemodynamically stable and nontoxic-appearing.  Patient was afebrile, fully tachycardic, hypertensive into the 737 systolic, satting appropriately on room air.  Physical exam upon arrival to the emergency department did not display any evidence of focal neurologic deficits.  Decision at this time was made to not activate code stroke.  As his expressive aphasia has resolved there is possibility for TIA.  Point-of-care glucose within normal limits.  EKG show atrial fibrillation at 88 bpm. No evidence of ST or T wave abnormalities concerning for acute ischemia.  Unchanged from prior.  CBC with no leukocytosis and hemoglobin at baseline at 11.1.  CMP with mild hyponatremia which is actually not a new issue for the patient as this is actually improved from prior studies.  No other significant electrolyte derangements are present.  Patient has increased creatinine of 1.32 however is not markedly changed from lab work obtained 2 years ago.  Troponin undetectable.  CT angiography of the head and neck with no no large or medium vessel occlusion.  50% stenosis of the right vertebral artery origin however widely patent beyond that.  Although patient's temporary expressive aphasia may be related  to TIA he is already on a blood thinning agent.  Will add daily aspirin to the patient's medication regimen.  Patient was hypertensive while in the emergency department.  I discussed patient's blood pressure control at length with his daughter Cody Mendoza who states his blood pressures are labile requiring recurrent dose adjustments with his PCP.  He has not taken his nightly losartan tonight which may be contributing to his elevated blood pressure.  He was given dose while in the ED.  Patient states that he is back at his baseline and feels well to return home.  He denies any chest pain, shortness of breath, or weakness.  Shortly after discharge the patient chest x-ray was reviewed which mentions ovoid retrocardiac density which may represent aneurysmal dilation of the aorta versus hiatal hernia.  I do not feel this would explain the patient's expressive aphasia.  Attempted to find recent chest imaging to see if this was something acute or slowly progressive.  Most recent x-ray on file is from 10 years ago.  As patient did not express any chest pain or shortness of breath he is likely safe for outpatient follow-up.  I called patient's daughter Cody Mendoza and explain these findings in depth with her.  I encouraged her to call the patient's primary care physician on Monday to plan follow-up imaging.  I discussed concerning signs and symptoms that would necessitate return to the emergency department before appointment could be made with primary care physician.  She voiced understanding of these instructions and had no further questions at this time.   Final Clinical Impressions(s) / ED Diagnoses   Final diagnoses:  Near syncope  Fall in home, initial encounter    ED Discharge Orders         Ordered    aspirin 81 MG chewable tablet  Daily     06/08/18 2120           Tommie Raymond, MD 06/08/18 2340    Mesner, Corene Cornea, MD 06/10/18 [phone]

## 2018-06-08 NOTE — ED Notes (Signed)
Family contact information: Kalev Temme (daughter) [phone]

## 2018-06-08 NOTE — ED Triage Notes (Signed)
Pt from home, witnessed fall by daughter while walking outside about 1 hour ago (1600), pt complained of dizziness prior to fall. Pt unable to get up and now he cannot walk. Pt having difficulty getting some words out but is able to tell his name, DOB and that he is in a hospital. Pt alert, nad noted

## 2018-08-06 DIAGNOSIS — M4644 Discitis, unspecified, thoracic region: Secondary | ICD-10-CM

## 2018-08-06 HISTORY — DX: Discitis, unspecified, thoracic region: M46.44

## 2018-08-23 ENCOUNTER — Encounter (HOSPITAL_COMMUNITY): Payer: Self-pay | Admitting: Radiology

## 2018-08-23 ENCOUNTER — Inpatient Hospital Stay (HOSPITAL_COMMUNITY)
Admission: EM | Admit: 2018-08-23 | Discharge: 2018-08-27 | DRG: 069 | Disposition: A | Discharge status: Home Health | Payer: Medicare Other | Attending: Internal Medicine | Admitting: Internal Medicine

## 2018-08-23 ENCOUNTER — Emergency Department (HOSPITAL_COMMUNITY): Payer: Medicare Other

## 2018-08-23 DIAGNOSIS — M4644 Discitis, unspecified, thoracic region: Secondary | ICD-10-CM | POA: Diagnosis present

## 2018-08-23 DIAGNOSIS — N182 Chronic kidney disease, stage 2 (mild): Secondary | ICD-10-CM | POA: Diagnosis not present

## 2018-08-23 DIAGNOSIS — M4624 Osteomyelitis of vertebra, thoracic region: Secondary | ICD-10-CM | POA: Diagnosis present

## 2018-08-23 DIAGNOSIS — G459 Transient cerebral ischemic attack, unspecified: Principal | ICD-10-CM | POA: Diagnosis present

## 2018-08-23 DIAGNOSIS — I4811 Longstanding persistent atrial fibrillation: Secondary | ICD-10-CM | POA: Diagnosis not present

## 2018-08-23 DIAGNOSIS — N400 Enlarged prostate without lower urinary tract symptoms: Secondary | ICD-10-CM | POA: Diagnosis present

## 2018-08-23 DIAGNOSIS — M4804 Spinal stenosis, thoracic region: Secondary | ICD-10-CM | POA: Diagnosis present

## 2018-08-23 DIAGNOSIS — M47814 Spondylosis without myelopathy or radiculopathy, thoracic region: Secondary | ICD-10-CM | POA: Diagnosis present

## 2018-08-23 DIAGNOSIS — I340 Nonrheumatic mitral (valve) insufficiency: Secondary | ICD-10-CM | POA: Diagnosis present

## 2018-08-23 DIAGNOSIS — Z7901 Long term (current) use of anticoagulants: Secondary | ICD-10-CM | POA: Diagnosis not present

## 2018-08-23 DIAGNOSIS — R479 Unspecified speech disturbances: Secondary | ICD-10-CM | POA: Diagnosis present

## 2018-08-23 DIAGNOSIS — D631 Anemia in chronic kidney disease: Secondary | ICD-10-CM | POA: Diagnosis present

## 2018-08-23 DIAGNOSIS — K219 Gastro-esophageal reflux disease without esophagitis: Secondary | ICD-10-CM | POA: Diagnosis present

## 2018-08-23 DIAGNOSIS — D649 Anemia, unspecified: Secondary | ICD-10-CM | POA: Diagnosis present

## 2018-08-23 DIAGNOSIS — M2428 Disorder of ligament, vertebrae: Secondary | ICD-10-CM | POA: Diagnosis present

## 2018-08-23 DIAGNOSIS — I129 Hypertensive chronic kidney disease with stage 1 through stage 4 chronic kidney disease, or unspecified chronic kidney disease: Secondary | ICD-10-CM | POA: Diagnosis present

## 2018-08-23 DIAGNOSIS — N183 Chronic kidney disease, stage 3 (moderate): Secondary | ICD-10-CM | POA: Diagnosis present

## 2018-08-23 DIAGNOSIS — I4819 Other persistent atrial fibrillation: Secondary | ICD-10-CM | POA: Diagnosis present

## 2018-08-23 DIAGNOSIS — I160 Hypertensive urgency: Secondary | ICD-10-CM | POA: Diagnosis present

## 2018-08-23 DIAGNOSIS — R918 Other nonspecific abnormal finding of lung field: Secondary | ICD-10-CM | POA: Diagnosis present

## 2018-08-23 DIAGNOSIS — I10 Essential (primary) hypertension: Secondary | ICD-10-CM | POA: Diagnosis present

## 2018-08-23 DIAGNOSIS — Z20828 Contact with and (suspected) exposure to other viral communicable diseases: Secondary | ICD-10-CM | POA: Diagnosis present

## 2018-08-23 DIAGNOSIS — M4640 Discitis, unspecified, site unspecified: Secondary | ICD-10-CM | POA: Diagnosis present

## 2018-08-23 DIAGNOSIS — Z79899 Other long term (current) drug therapy: Secondary | ICD-10-CM | POA: Diagnosis not present

## 2018-08-23 DIAGNOSIS — E871 Hypo-osmolality and hyponatremia: Secondary | ICD-10-CM | POA: Diagnosis present

## 2018-08-23 DIAGNOSIS — Z66 Do not resuscitate: Secondary | ICD-10-CM | POA: Diagnosis present

## 2018-08-23 DIAGNOSIS — Z8249 Family history of ischemic heart disease and other diseases of the circulatory system: Secondary | ICD-10-CM | POA: Diagnosis not present

## 2018-08-23 DIAGNOSIS — H9190 Unspecified hearing loss, unspecified ear: Secondary | ICD-10-CM | POA: Diagnosis present

## 2018-08-23 DIAGNOSIS — I4891 Unspecified atrial fibrillation: Secondary | ICD-10-CM | POA: Diagnosis present

## 2018-08-23 DIAGNOSIS — I472 Ventricular tachycardia: Secondary | ICD-10-CM | POA: Diagnosis not present

## 2018-08-23 DIAGNOSIS — Z452 Encounter for adjustment and management of vascular access device: Secondary | ICD-10-CM

## 2018-08-23 HISTORY — DX: Lactose intolerance, unspecified: E73.9

## 2018-08-23 LAB — URINALYSIS, ROUTINE W REFLEX MICROSCOPIC
Bilirubin Urine: NEGATIVE
Glucose, UA: NEGATIVE mg/dL
Hgb urine dipstick: NEGATIVE
Ketones, ur: NEGATIVE mg/dL
Leukocytes,Ua: NEGATIVE
Nitrite: NEGATIVE
Protein, ur: NEGATIVE mg/dL
Specific Gravity, Urine: 1.012 (ref 1.005–1.030)
pH: 7 (ref 5.0–8.0)

## 2018-08-23 LAB — CBC WITH DIFFERENTIAL/PLATELET
Abs Immature Granulocytes: 0.02 10*3/uL (ref 0.00–0.07)
Basophils Absolute: 0.1 10*3/uL (ref 0.0–0.1)
Basophils Relative: 1 %
Eosinophils Absolute: 0.1 10*3/uL (ref 0.0–0.5)
Eosinophils Relative: 3 %
HCT: 31.2 % — ABNORMAL LOW (ref 39.0–52.0)
Hemoglobin: 10.5 g/dL — ABNORMAL LOW (ref 13.0–17.0)
Immature Granulocytes: 0 %
Lymphocytes Relative: 16 %
Lymphs Abs: 0.8 10*3/uL (ref 0.7–4.0)
MCH: 31.1 pg (ref 26.0–34.0)
MCHC: 33.7 g/dL (ref 30.0–36.0)
MCV: 92.3 fL (ref 80.0–100.0)
Monocytes Absolute: 0.7 10*3/uL (ref 0.1–1.0)
Monocytes Relative: 14 %
Neutro Abs: 3.2 10*3/uL (ref 1.7–7.7)
Neutrophils Relative %: 66 %
Platelets: 219 10*3/uL (ref 150–400)
RBC: 3.38 MIL/uL — ABNORMAL LOW (ref 4.22–5.81)
RDW: 12.2 % (ref 11.5–15.5)
WBC: 4.8 10*3/uL (ref 4.0–10.5)
nRBC: 0 % (ref 0.0–0.2)

## 2018-08-23 LAB — COMPREHENSIVE METABOLIC PANEL
ALT: 19 U/L (ref 0–44)
AST: 30 U/L (ref 15–41)
Albumin: 3.7 g/dL (ref 3.5–5.0)
Alkaline Phosphatase: 78 U/L (ref 38–126)
Anion gap: 8 (ref 5–15)
BUN: 18 mg/dL (ref 8–23)
CO2: 25 mmol/L (ref 22–32)
Calcium: 9.1 mg/dL (ref 8.9–10.3)
Chloride: 95 mmol/L — ABNORMAL LOW (ref 98–111)
Creatinine, Ser: 1.2 mg/dL (ref 0.61–1.24)
GFR calc Af Amer: 60 mL/min — ABNORMAL LOW (ref >60)
GFR calc non Af Amer: 51 mL/min — ABNORMAL LOW (ref >60)
Glucose, Bld: 98 mg/dL (ref 70–99)
Potassium: 4.2 mmol/L (ref 3.5–5.1)
Sodium: 128 mmol/L — ABNORMAL LOW (ref 135–145)
Total Bilirubin: 1.4 mg/dL — ABNORMAL HIGH (ref 0.3–1.2)
Total Protein: 6.6 g/dL (ref 6.5–8.1)

## 2018-08-23 LAB — TROPONIN I: Troponin I: <0.03 ng/mL (ref <0.03)

## 2018-08-23 MED ORDER — ASPIRIN 81 MG PO CHEW: 81.0000 mg | CHEWABLE_TABLET | Freq: Every day | ORAL | Status: DC

## 2018-08-23 MED ORDER — IOHEXOL 350 MG/ML SOLN
100.0000 mL | Freq: Once | INTRAVENOUS | Status: AC | PRN
  Administered 2018-08-23: 100 mL via INTRAVENOUS

## 2018-08-23 MED ORDER — LOSARTAN POTASSIUM 50 MG PO TABS
50.0000 mg | ORAL_TABLET | Freq: Every day | ORAL | Status: DC
  Administered 2018-08-24 – 2018-08-26 (×3): 50 mg via ORAL
  Filled 2018-08-23 (×3): qty 1

## 2018-08-23 MED ORDER — ONDANSETRON HCL 4 MG PO TABS: 4.0000 mg | ORAL_TABLET | Freq: Four times a day (QID) | ORAL | Status: DC | PRN

## 2018-08-23 MED ORDER — POLYVINYL ALCOHOL 1.4 % OP SOLN
1.0000 [drp] | Freq: Every day | OPHTHALMIC | Status: DC
  Administered 2018-08-23 – 2018-08-26 (×4): 1 [drp] via OPHTHALMIC
  Filled 2018-08-23: qty 15

## 2018-08-23 MED ORDER — ACETAMINOPHEN 325 MG PO TABS: 650.0000 mg | ORAL_TABLET | Freq: Four times a day (QID) | ORAL | Status: DC | PRN

## 2018-08-23 MED ORDER — HYDRALAZINE HCL 20 MG/ML IJ SOLN
5.0000 mg | Freq: Once | INTRAMUSCULAR | Status: AC
  Administered 2018-08-23: 16:00:00 5 mg via INTRAVENOUS
  Filled 2018-08-23: qty 1

## 2018-08-23 MED ORDER — PANTOPRAZOLE SODIUM 40 MG PO TBEC
40.0000 mg | DELAYED_RELEASE_TABLET | Freq: Every morning | ORAL | Status: DC
  Administered 2018-08-24 – 2018-08-27 (×4): 40 mg via ORAL
  Filled 2018-08-23 (×4): qty 1

## 2018-08-23 MED ORDER — LATANOPROST 0.005 % OP SOLN
1.0000 [drp] | Freq: Every day | OPHTHALMIC | Status: DC
  Administered 2018-08-23 – 2018-08-26 (×4): 1 [drp] via OPHTHALMIC
  Filled 2018-08-23: qty 2.5

## 2018-08-23 MED ORDER — HEPARIN (PORCINE) 25000 UT/250ML-% IV SOLN
950.0000 [IU]/h | INTRAVENOUS | Status: DC
  Administered 2018-08-24: 800 [IU]/h via INTRAVENOUS
  Filled 2018-08-23 (×2): qty 250

## 2018-08-23 MED ORDER — VITAMIN B-12 1000 MCG PO TABS
500.0000 ug | ORAL_TABLET | Freq: Every day | ORAL | Status: DC
  Administered 2018-08-24 – 2018-08-27 (×4): 500 ug via ORAL
  Filled 2018-08-23 (×5): qty 1

## 2018-08-23 MED ORDER — ONDANSETRON HCL 4 MG/2ML IJ SOLN: 4.0000 mg | Freq: Four times a day (QID) | INTRAMUSCULAR | Status: DC | PRN

## 2018-08-23 MED ORDER — DOXAZOSIN MESYLATE 2 MG PO TABS
2.0000 mg | ORAL_TABLET | ORAL | Status: DC
  Administered 2018-08-24: 2 mg via ORAL
  Filled 2018-08-23: qty 1

## 2018-08-23 MED ORDER — GADOBUTROL 1 MMOL/ML IV SOLN
5.0000 mL | Freq: Once | INTRAVENOUS | Status: AC | PRN
  Administered 2018-08-23: 19:00:00 5 mL via INTRAVENOUS

## 2018-08-23 MED ORDER — ACETAMINOPHEN 650 MG RE SUPP: 650.0000 mg | Freq: Four times a day (QID) | RECTAL | Status: DC | PRN

## 2018-08-23 MED ORDER — LABETALOL HCL 5 MG/ML IV SOLN
10.0000 mg | Freq: Once | INTRAVENOUS | Status: AC
  Administered 2018-08-23: 10 mg via INTRAVENOUS
  Filled 2018-08-23: qty 4

## 2018-08-23 NOTE — ED Notes (Signed)
Daughter at bedside. updated

## 2018-08-23 NOTE — ED Notes (Signed)
Pt. At MRI will assess pt when they return to room

## 2018-08-23 NOTE — ED Notes (Signed)
Cody Mendoza [phone] call when release or for emergency

## 2018-08-23 NOTE — ED Notes (Signed)
This RN spoke with daughter Jocelyn Lamer and gave an update.

## 2018-08-23 NOTE — ED Triage Notes (Signed)
Pt BIB family for altered mental status. Per patient he felt fine this morning when he woke up and then when he was walking back from the mailbox he felt unsteady. Per patient's family patient then had some difficulty speaking and couldn't remember his daughter's name. Pt is alert and oriented to person and time at present. Pt is unsure of what hospital he is at.

## 2018-08-23 NOTE — ED Notes (Signed)
Patient transported to CT 

## 2018-08-23 NOTE — ED Notes (Signed)
Pt reports his blood pressure has been higher than normal and his doctor advised him to take an additional losartan dose at 0400 this morning.

## 2018-08-23 NOTE — H&P (Addendum)
History and Physical    Cody Mendoza:[id number] DOB: 1921-10-30 DOA: 08/23/2018  PCP: Crist Infante, MD  Patient coming from: Home  Chief Complaint: Difficulty speaking and balance.  HPI: Cody Mendoza is a 85 y.o. male with history of atrial fibrillation, hypertension, mitral insufficiency was brought to the ER after patient was noted to have some difficulty speaking around 10:30 AM this morning.  Patient also states that he had some issues with balance and had a similar episode about last month and at that time symptoms got better he did not come to the ER.  This time patient was brought to the ER as a possible code stroke.  Patient otherwise denies any chest pain shortness of breath nausea vomiting diarrhea headache visual symptoms or cough or fever.  ED Course: In the ER patient was afebrile.  MRI brain unremarkable.  Chest x-ray shows possibility of aneurysm of the thoracic aorta for which patient underwent dissection studies which showed a T8-T9 discitis and MRI was done which confirmed it.  Patient does not have any back pain or any weakness of the lower extremity.  On-call neurosurgeon was consulted and patient admitted for further work-up.  Review of Systems: As per HPI, rest all negative.   Past Medical History:  Diagnosis Date   AF (atrial fibrillation) (HCC)    Asthma    Detached retina    Diverticulosis    GERD (gastroesophageal reflux disease)    [name] syndrome    Hypertension    Mitral insufficiency    Renal calculi     Past Surgical History:  Procedure Laterality Date   cataract surgery     ESOPHAGEAL DILATION     In the past   FINGER TENDON REPAIR     INGUINAL HERNIA REPAIR     KIDNEY STONE SURGERY     Status post syrgical removal of a    TONSILLECTOMY       reports that he has never smoked. He has never used smokeless tobacco. He reports current alcohol use. He reports that he does not use drugs.  No Known  Allergies  Family History  Problem Relation Age of Onset   Hypertension Mother     Prior to Admission medications   Medication Sig Start Date End Date Taking? Authorizing Provider  losartan (COZAAR) 100 MG tablet Take 50 mg by mouth at bedtime.    Yes [provider]  aspirin 81 MG chewable tablet Chew 1 tablet (81 mg total) by mouth daily. 06/08/18   Tommie Raymond, MD  Cyanocobalamin (VITAMIN B12) 500 MCG TABS Take 500 mcg by mouth daily.    [provider]  dabigatran (PRADAXA) 150 MG CAPS Take 1 capsule (150 mg total) by mouth every 12 (twelve) hours. 01/18/12   Martinique, Peter M, MD  doxazosin (CARDURA) 4 MG tablet Take 2 mg by mouth every other day.     [provider]  Lactobacillus (PROBIOTIC ACIDOPHILUS PO) Take 1 capsule by mouth daily.     [provider]  latanoprost (XALATAN) 0.005 % ophthalmic solution Place 1 drop into both eyes at bedtime.  09/05/17   [provider]  Multiple Vitamin (MULTIVITAMIN WITH MINERALS) TABS Take 1 tablet by mouth daily before supper.     [provider]  Multiple Vitamins-Minerals (ICAPS AREDS FORMULA PO) Take 1 capsule by mouth 2 (two) times daily.     [provider]  pantoprazole (PROTONIX) 40 MG tablet Take 40 mg by mouth every morning.  [provider]  Polyethyl Glycol-Propyl Glycol (SYSTANE) 0.4-0.3 % SOLN Place 1 drop into both eyes at bedtime.     [provider]  vitamin C (ASCORBIC ACID) 500 MG tablet Take 500 mg by mouth daily before supper.     [provider]    Physical Exam: Vitals:   08/23/18 2002 08/23/18 2015 08/23/18 2030 08/23/18 2145  BP: (!) 163/71 (!) 179/125 (!) 175/89 (!) 173/89  Pulse: 75 97 90 86  Resp: 20 18 (!) 23 20  Temp:      SpO2: 96% 90% 96% 100%  Weight:      Height:          Constitutional: Moderately built and nourished. Vitals:   08/23/18 2002 08/23/18 2015 08/23/18 2030 08/23/18 2145  BP: (!) 163/71 (!)  179/125 (!) 175/89 (!) 173/89  Pulse: 75 97 90 86  Resp: 20 18 (!) 23 20  Temp:      SpO2: 96% 90% 96% 100%  Weight:      Height:       Eyes: Anicteric no pallor. ENMT: No discharge from the ears eyes nose and mouth. Neck: No mass felt.  No neck rigidity. Respiratory: No rhonchi or crepitations. Cardiovascular: S1-S2 heard. Abdomen: Soft nontender bowel sounds present. Musculoskeletal: No edema. Skin: No rash. Neurologic: Alert awake oriented to time place and person.  Moves all extremities. Psychiatric: Appears normal.  Normal affect.   Labs on Admission: I have personally reviewed following labs and imaging studies  CBC: Recent Labs  Lab 08/23/18 1202  WBC 4.8  NEUTROABS 3.2  HGB 10.5*  HCT 31.2*  MCV 92.3  PLT 381   Basic Metabolic Panel: Recent Labs  Lab 08/23/18 1202  NA 128*  K 4.2  CL 95*  CO2 25  GLUCOSE 98  BUN 18  CREATININE 1.20  CALCIUM 9.1   GFR: Estimated Creatinine Clearance: 34.6 mL/min (by C-G formula based on SCr of 1.2 mg/dL). Liver Function Tests: Recent Labs  Lab 08/23/18 1202  AST 30  ALT 19  ALKPHOS 78  BILITOT 1.4*  PROT 6.6  ALBUMIN 3.7   No results for input(s): LIPASE, AMYLASE in the last 168 hours. No results for input(s): AMMONIA in the last 168 hours. Coagulation Profile: No results for input(s): INR, PROTIME in the last 168 hours. Cardiac Enzymes: Recent Labs  Lab 08/23/18 1202  TROPONINI <0.03   BNP (last 3 results) No results for input(s): PROBNP in the last 8760 hours. HbA1C: No results for input(s): HGBA1C in the last 72 hours. CBG: No results for input(s): GLUCAP in the last 168 hours. Lipid Profile: No results for input(s): CHOL, HDL, LDLCALC, TRIG, CHOLHDL, LDLDIRECT in the last 72 hours. Thyroid Function Tests: No results for input(s): TSH, T4TOTAL, FREET4, T3FREE, THYROIDAB in the last 72 hours. Anemia Panel: No results for input(s): VITAMINB12, FOLATE, FERRITIN, TIBC, IRON, RETICCTPCT in the last  72 hours. Urine analysis:    Component Value Date/Time   COLORURINE YELLOW 08/23/2018 1151   APPEARANCEUR CLEAR 08/23/2018 1151   LABSPEC 1.012 08/23/2018 1151   PHURINE 7.0 08/23/2018 1151   GLUCOSEU NEGATIVE 08/23/2018 1151   HGBUR NEGATIVE 08/23/2018 1151   BILIRUBINUR NEGATIVE 08/23/2018 1151   KETONESUR NEGATIVE 08/23/2018 1151   PROTEINUR NEGATIVE 08/23/2018 1151   UROBILINOGEN 1.0 09/21/2014 1610   NITRITE NEGATIVE 08/23/2018 1151   LEUKOCYTESUR NEGATIVE 08/23/2018 1151   Sepsis Labs: @LABRCNTIP (procalcitonin:4,lacticidven:4) )No results found for this or any previous visit (from the past 240 hour(s)).  Radiological Exams on Admission: Dg Chest 2 View  Result Date: 08/23/2018 CLINICAL DATA:  Altered mental status. EXAM: CHEST - 2 VIEW COMPARISON:  Chest x-rays dated 06/08/2018 and 03/02/2009. FINDINGS: Heart size and mediastinal contours are stable. Coarse interstitial markings again noted throughout both lungs, increased compared to the previous studies, suggesting acute interstitial edema superimposed on chronic interstitial lung disease. No confluent opacity to suggest consolidating pneumonia. No pleural effusion or pneumothorax seen. Retrocardiac density again noted, measuring approximately 8 cm width, not significantly changed compared to the previous study, compatible with aortic ectasia versus aneurysm, less likely hiatal hernia. IMPRESSION: 1. Coarse interstitial markings throughout both lungs, increased compared to the previous studies, suggesting acute interstitial edema superimposed on chronic interstitial lung disease. No evidence of consolidating pneumonia. 2. Persistent density overlying the lower mediastinum, measuring approximately 8 cm width, with differential considerations of aneurysmal dilatation of the thoracic aorta versus ectasia of the thoracic aorta, less likely hiatal hernia. Recommend further characterization with chest CT if 1 has not been recently  performed. Electronically Signed   By: Franki Cabot M.D.   On: 08/23/2018 15:10   Ct Head Wo Contrast  Result Date: 08/23/2018 CLINICAL DATA:  Altered level of consciousness. EXAM: CT HEAD WITHOUT CONTRAST TECHNIQUE: Contiguous axial images were obtained from the base of the skull through the vertex without intravenous contrast. COMPARISON:  06/08/2018 FINDINGS: Brain: Moderate atrophy. Moderate to extensive chronic white matter changes bilaterally appear stable from the prior study. No acute infarct, hemorrhage, mass. No midline shift Vascular: Negative for hyperdense vessel Skull: Negative Sinuses/Orbits: Mucosal edema paranasal sinuses. Bilateral ocular surgery Other: None IMPRESSION: Atrophy and chronic ischemia.  No acute abnormality. Electronically Signed   By: Franchot Gallo M.D.   On: 08/23/2018 12:46   Mr Brain Wo Contrast  Result Date: 08/23/2018 CLINICAL DATA:  Altered level consciousness. Difficulty speaking. Unsteadiness. EXAM: MRI HEAD WITHOUT CONTRAST TECHNIQUE: Multiplanar, multiecho pulse sequences of the brain and surrounding structures were obtained without intravenous contrast. COMPARISON:  Head CT 08/23/2018 and MRI 04/24/2012 FINDINGS: Brain: There is no evidence of acute infarct, intracranial hemorrhage, mass, midline shift, or extra-axial fluid collection. Confluent T2 hyperintensities in the cerebral white matter have progressed from the prior MRI and are particularly notable in the occipital and parietal lobes without cortical involvement, nonspecific but compatible with severe chronic small vessel ischemic disease. There is moderate cerebral atrophy. Vascular: Major intracranial vascular flow voids are preserved. Skull and upper cervical spine: Unremarkable bone marrow signal. Sinuses/Orbits: Bilateral cataract extraction. Minimal bilateral maxillary sinus mucosal thickening. Trace right mastoid effusion. Other: None. IMPRESSION: 1. No acute intracranial abnormality. 2. Severe  chronic small vessel ischemic disease, progressed from 2014. Electronically Signed   By: Logan Bores M.D.   On: 08/23/2018 15:08   Mr Thoracic Spine W Wo Contrast  Result Date: 08/23/2018 CLINICAL DATA:  Initial evaluation for abnormal CT, disc destruction at T8-9. EXAM: MRI THORACIC SPINE WITHOUT CONTRAST TECHNIQUE: Multiplanar, multisequence MR imaging of the thoracic spine was performed. No intravenous contrast was administered. CONTRAST:  5 cc of Gadavist. COMPARISON:  Prior CT from earlier the same day. FINDINGS: Alignment: Mild straightening of the normal mid thoracic kyphosis. No listhesis or malalignment. Vertebrae: Mild height loss at the superior endplates of T1 and T4 are chronic in appearance. Vertebral body height otherwise maintained without evidence for acute or subacute fracture. Underlying bone marrow signal intensity within normal limits. Few scattered benign hemangiomas noted. No worrisome osseous lesions. Severe loss of disc space height with  endplate irregularity seen at the T8-9 level, corresponding with abnormality seen on prior CT. Associated fluid signal intensity within the residual T8-9 disc with associated endplate irregularity. Abnormal marrow edema and enhancement within the adjacent T8 and T9 vertebral bodies. Findings compatible with acute osteomyelitis discitis. Edema and enhancement extends into the posterior elements bilaterally, with involvement of the T8-9 facets, which could reflect associated septic arthropathy. Associated mild epidural enhancement at this level without frank epidural abscess or collection. Paraspinous phlegmon and edema seen adjacent to the T8-9 interspace without discrete paraspinous abscess. Resultant moderate to severe spinal stenosis at this level. No other evidence for acute infection within the thoracic spine. No other abnormal marrow edema or enhancement. Cord: Signal intensity within the thoracic spinal cord is within normal limits. Specifically,  no cord signal abnormality seen at the level of T8-9. Paraspinal soft tissues: Paraspinous edema and phlegmon without discrete abscess or collection adjacent to the T8-9 interspace. Paraspinous soft tissues otherwise within normal limits. Trace layering bilateral pleural effusions with associated mild atelectasis. Atherosclerotic change noted within the visualized aorta. 5.7 cm exophytic simple right renal cyst partially visualized. Visualized visceral structures otherwise unremarkable. Disc levels: T1-2: Minimal disc bulge. Mild right greater than left facet hypertrophy. No significant spinal stenosis. Mild to moderate right with mild left foraminal narrowing. T2-3: Minimal disc bulge with bilateral facet hypertrophy. No significant stenosis. T3-4: Minimal disc bulge with bilateral facet hypertrophy, greater on the right. No significant spinal stenosis. Mild right foraminal narrowing. T4-5: Negative interspace. Mild facet hypertrophy. No significant spinal stenosis. Mild right foraminal narrowing. T5-6: Negative interspace. Bilateral facet hypertrophy with associated small joint effusions. No spinal stenosis. Mild bilateral foraminal narrowing. T6-7: Negative interspace. Mild facet hypertrophy. No significant stenosis. T7-8: Shallow central disc protrusion mildly flattens the ventral thecal sac. Mild facet hypertrophy. No significant spinal stenosis. Foramina remain patent. T8-9: Changes consistent with osteomyelitis discitis with partial obliteration of the T8-9 interspace. Associated bilateral facet arthrosis with ligamentum flavum hypertrophy. Resultant moderate to severe spinal stenosis with mild flattening of the thoracic spinal cord. Thecal sac measures 5 mm in AP diameter at its most narrow point. No associated cord signal changes. Moderate to severe bilateral foraminal narrowing. T9-10: Negative interspace. Bilateral facet hypertrophy. No significant stenosis. T10-11: Negative interspace. Bilateral facet  hypertrophy. No significant stenosis. T11-12: Mild annular disc bulge with facet hypertrophy. Mild spinal stenosis without cord impingement. Foramina remain patent. T12-L1:  Negative. IMPRESSION: 1. Findings consistent with acute osteomyelitis discitis at T8-9 as detailed above. Extension of edema into the posterior elements of T8-9 in about the bilateral T8-9 facets, which could reflect associated septic arthritis. Mild localized epidural enhancement without frank epidural abscess or collection. Resultant moderate to severe spinal stenosis at this level without cord signal changes. 2. Associated paraspinous phlegmon and enhancement adjacent to the T8-9 interspace without discrete paraspinous abscess or drainable fluid collection. 3. Mild multilevel degenerative spondylolysis elsewhere within the thoracic spine without significant spinal stenosis. Mild to moderate bilateral foraminal narrowing within the upper thoracic spine as detailed above. Electronically Signed   By: Jeannine Boga M.D.   On: 08/23/2018 19:59   Ct Angio Chest/abd/pel For Dissection W And/or Wo Contrast  Result Date: 08/23/2018 CLINICAL DATA:  85 year old with chest and back pain. Evaluate for aortic dissection. EXAM: CT ANGIOGRAPHY CHEST, ABDOMEN AND PELVIS TECHNIQUE: Multidetector CT imaging through the chest, abdomen and pelvis was performed using the standard protocol during bolus administration of intravenous contrast. Multiplanar reconstructed images and MIPs were obtained and reviewed to  evaluate the vascular anatomy. CONTRAST:  142mL OMNIPAQUE IOHEXOL 350 MG/ML SOLN COMPARISON:  Neck CTA 06/08/2018 FINDINGS: CTA CHEST FINDINGS Cardiovascular: Normal caliber of the thoracic aorta with dissection. Atherosclerotic calcifications throughout the thoracic aorta. Great vessels are patent without dissection. Pulmonary arteries are patent without filling defects or pulmonary emboli. No significant pericardial fluid. There are coronary  artery calcifications. Mediastinum/Nodes: Subcarinal tissue is prominent measuring up to 1.5 cm on sequence 6, image 46. Mildly prominent lymph nodes in the AP window. AP window lymph node measuring 1.1 cm on sequence 6, image 36. Mildly prominent right hilar soft tissue. No axillary lymph node enlargement. Lungs/Pleura: Trachea and mainstem bronchi are patent. No large pleural effusions. No large areas of consolidation but there are prominent interstitial densities in the right lower lobe. Findings could be a combination of scarring and atelectasis. Mild thickening along the right major fissure on sequence 8, image 51 that is nonspecific. At least 2 small nodules in the right lower lobe, measuring 5 mm on sequence 6, image 44 and image 57. Pulmonary nodules are likely incidental findings. No suspicious pulmonary nodules. Musculoskeletal: There is marked abnormality at the T8-T9 disc space. There appears to be destruction of the disc space with destruction of the T8 inferior endplate and the T9 superior endplate. In addition, there is extensive osteophytosis or heterotopic bone formation in this region. Increased soft tissue at the level of T8-T9. Findings are concerning for underlying infectious or inflammatory process. Question mild superior compression deformity at T4. Review of the MIP images confirms the above findings. CTA ABDOMEN AND PELVIS FINDINGS VASCULAR Aorta: Atherosclerotic calcifications in the abdominal aorta without aneurysm or dissection. Celiac: Patent without evidence of aneurysm, dissection, vasculitis or significant stenosis. 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 SMA is patent. Renals: Both renal arteries are patent without evidence of aneurysm, dissection, vasculitis, fibromuscular dysplasia or significant stenosis. IMA: IMA is patent. Inflow: Patent without evidence of aneurysm, dissection, vasculitis or significant stenosis. Veins: No obvious venous  abnormality within the limitations of this arterial phase study. Incidentally, there is a retroaortic left renal vein. Review of the MIP images confirms the above findings. NON-VASCULAR Hepatobiliary: High-density material in the gallbladder is compatible with stones. No evidence for gallbladder inflammation. No acute abnormality to the liver. Pancreas: Artifact in the region of the pancreas but no gross abnormality. Spleen: Motion artifact in this region but no gross abnormality. Adrenals/Urinary Tract: Normal appearance of the adrenal glands. Large low-density structure along the right kidney upper pole is most compatible with a cyst measuring up to 6.6 cm. Negative for hydronephrosis. No suspicious renal lesions. Urinary bladder is displaced superiorly with wall thickening along the right side of the bladder. Bladder wall thickening is noted on sequence 6, image 159. Stomach/Bowel: Normal appendix. Normal appearance of the stomach. No evidence for bowel dilatation. No focal bowel inflammation. Lymphatic: No significant lymph node enlargement in the abdomen or pelvis. Reproductive: Prostate is markedly enlarged measuring roughly 6.3 x 5.9 x 8.8 cm. Other: Negative for free fluid. Negative for free air. Evidence for defect along the lateral left abdominal wall on sequence 6, image 116 that may be related to a fat containing hernia. Musculoskeletal: Both hips are located. Complete disc space loss at L2-L3. 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 Review of the MIP images confirms the above findings. IMPRESSION: 1. Negative for an aortic aneurysm or dissection. Aortic Atherosclerosis (ICD10-I70.0). 2. Destruction of the disc space at T8-T9  is suggestive for osteomyelitis and discitis at this level. Complete disc space loss at L2-L3 could represent degenerative disc disease but cannot exclude discitis based on the findings at T8-T9. Recommend further characterization of the thoracolumbar spine with  MRI. 3. Mild mediastinal lymphadenopathy is nonspecific but may be reactive. 4. Cholelithiasis. 5. Prostate enlargement. Wall thickening along the right side of the bladder is nonspecific and could be related to bladder outlet obstruction. 6. Scattered interstitial thickening in the right lower lobe may represent a combination of atelectasis and chronic changes. Small nodular densities in lungs, largest measuring 5 mm, are likely incidental findings. 7. 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 These results were called by telephone at the time of interpretation on 08/23/2018 at 5:38 pm to Dr. Isla Pence, who verbally acknowledged these results. Electronically Signed   By: Markus Daft M.D.   On: 08/23/2018 17:39    EKG: Independently reviewed.  A. fib rate controlled.  Assessment/Plan Principal Problem:   Discitis of thoracic region Active Problems:   Hypertension   Atrial fibrillation (HCC)   CKD (chronic kidney disease) stage 2, GFR 60-89 ml/min   Chronic anemia   Discitis    1. Discitis of the T8-T9 -appreciate neurosurgery consult.  Will get IR consult in the morning to get aspiration and culture of the discitis following which we will start antibiotics.  Presently does not appear septic. 2. Uncontrolled hypertension was given labetalol following which blood pressure improved on Cozaar. 3. A. fib presently rate controlled.  On no rate limiting medications.  Will hold Pradaxa in anticipation of procedure will keep patient on heparin for now.  Last dose of Pradaxa was this morning. 4. History of mitral insufficiency denies any shortness of breath. 5. BPH on Cardura.  CAT scan was showing some possible outlet obstruction.  Will do bladder scan. 6. Chronic anemia follow CBC.   DVT prophylaxis: Presently on heparin infusion. Code Status: DNR. Family Communication: We will need to discuss with family. Disposition Plan: To be determined. Consults called: Neurosurgery. Admission status:  Inpatient.   [name] MD [facility] Pager [phone].  If 7PM-7AM, please contact night-coverage [email or web address] Password [email or web address]  08/23/2018, 10:20 PM

## 2018-08-23 NOTE — ED Notes (Signed)
Please call pt's daughter to give an update on pt. She is concerned about whether he is going to get admitted or discharged. Also whether he has had anything to eat or drink since his arrival today. [phone]

## 2018-08-23 NOTE — Progress Notes (Signed)
ANTICOAGULATION CONSULT NOTE - Initial Consult  Pharmacy Consult for Heparin Indication: atrial fibrillation  No Known Allergies  Patient Measurements: Height: 5\' 7"  (170.2 cm) Weight: 143 lb (64.9 kg) IBW/kg (Calculated) : 66.1  Vital Signs: Temp: 97.7 F (36.5 C) (06/18 2325) Temp Source: Oral (06/18 2325) BP: 181/84 (06/18 2325) Pulse Rate: 80 (06/18 2325)  Labs: Recent Labs    08/23/18 1202  HGB 10.5*  HCT 31.2*  PLT 219  CREATININE 1.20  TROPONINI <0.03    Estimated Creatinine Clearance: 34.6 mL/min (by C-G formula based on SCr of 1.2 mg/dL).   Medical History: Past Medical History:  Diagnosis Date  . AF (atrial fibrillation) ([place])   . Asthma   . Detached retina   . Diverticulosis   . GERD (gastroesophageal reflux disease)   . [name] syndrome   . Hypertension   . Mitral insufficiency   . Renal calculi     Medications:  Medications Prior to Admission  Medication Sig Dispense Refill Last Dose  . losartan (COZAAR) 100 MG tablet Take 50 mg by mouth at bedtime.    08/23/2018 at 0400  . aspirin 81 MG chewable tablet Chew 1 tablet (81 mg total) by mouth daily. 30 tablet 0   . Cyanocobalamin (VITAMIN B12) 500 MCG TABS Take 500 mcg by mouth daily.     . dabigatran (PRADAXA) 150 MG CAPS Take 1 capsule (150 mg total) by mouth every 12 (twelve) hours. 60 capsule 5   . doxazosin (CARDURA) 4 MG tablet Take 2 mg by mouth every other day.      . Lactobacillus (PROBIOTIC ACIDOPHILUS PO) Take 1 capsule by mouth daily.      Marland Kitchen latanoprost (XALATAN) 0.005 % ophthalmic solution Place 1 drop into both eyes at bedtime.   11   . Multiple Vitamin (MULTIVITAMIN WITH MINERALS) TABS Take 1 tablet by mouth daily before supper.      . Multiple Vitamins-Minerals (ICAPS AREDS FORMULA PO) Take 1 capsule by mouth 2 (two) times daily.      . pantoprazole (PROTONIX) 40 MG tablet Take 40 mg by mouth every morning.      [name] Glycol-Propyl Glycol (SYSTANE) 0.4-0.3 % SOLN Place 1 drop  into both eyes at bedtime.      . vitamin C (ASCORBIC ACID) 500 MG tablet Take 500 mg by mouth daily before supper.        Assessment: 85 y.o. male admitted with spinal stenosis,  h/o Afib and Pradaxa on hold, for heparin  Goal of Therapy:  Heparin level 0.3-0.7 units/ml Monitor platelets by anticoagulation protocol: Yes   Plan:  Start heparin 800 units/hr Check heparin level in 8 hours.   Caryl Pina 08/23/2018,11:32 PM

## 2018-08-23 NOTE — ED Notes (Addendum)
Nurse Navigator communication: the pts daughter was recently at bedside. She has gone home per the primary RN. Her number is in the chart and narrator, she will be updated.

## 2018-08-23 NOTE — ED Provider Notes (Signed)
Pt signed out by Dr. Darl Householder pending CT chest/abd/pelvis result.  This showed     IMPRESSION:  1. Negative for an aortic aneurysm or dissection. Aortic  Atherosclerosis (ICD10-I70.0).  2. Destruction of the disc space at T8-T9 is suggestive for  osteomyelitis and discitis at this level. Complete disc space loss  at L2-L3 could represent degenerative disc disease but cannot  exclude discitis based on the findings at T8-T9. Recommend further  characterization of the thoracolumbar spine with MRI.  3. Mild mediastinal lymphadenopathy is nonspecific but may be  reactive.  4. Cholelithiasis.  5. Prostate enlargement. Wall thickening along the right side of the  bladder is nonspecific and could be related to bladder outlet  obstruction.  6. Scattered interstitial thickening in the right lower lobe may  represent a combination of atelectasis and chronic changes. Small  nodular densities in lungs, largest measuring 5 mm, are likely  incidental findings.  7. 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   Due to the above findings, pt had a MRI of the thoracic spine was ordered.  IMPRESSION:  1. Findings consistent with acute osteomyelitis discitis at T8-9 as  detailed above. Extension of edema into the posterior elements of  T8-9 in about the bilateral T8-9 facets, which could reflect  associated septic arthritis. Mild localized epidural enhancement  without frank epidural abscess or collection. Resultant moderate to  severe spinal stenosis at this level without cord signal changes.  2. Associated paraspinous phlegmon and enhancement adjacent to the  T8-9 interspace without discrete paraspinous abscess or drainable  fluid collection.  3. Mild multilevel degenerative spondylolysis elsewhere within the  thoracic spine without significant spinal stenosis. Mild to moderate  bilateral foraminal narrowing within the upper thoracic spine as  detailed above.   Pt was d/w PA Costello from [facility] who will see  pt.  He saw pt and pt does not want surgery.  They said to hold off on abx until IR can get a piece of the infection.  They will also consult ID.  Pt's daughter updated.  Pt d/w Dr. Hal Hope ([facility]) for admission.  Pt was given labetalol for his htn which did bring his bp down to 163/71  CRITICAL CARE Performed by: Isla Pence   Total critical care time: 30 minutes  Critical care time was exclusive of separately billable procedures and treating other patients.  Critical care was necessary to treat or prevent imminent or life-threatening deterioration.  Critical care was time spent personally by me on the following activities: development of treatment plan with patient and/or surrogate as well as nursing, discussions with consultants, evaluation of patient's response to treatment, examination of patient, obtaining history from patient or surrogate, ordering and performing treatments and interventions, ordering and review of laboratory studies, ordering and review of radiographic studies, pulse oximetry and re-evaluation of patient's condition.   Isla Pence, MD 08/23/18 [id number]

## 2018-08-23 NOTE — Consult Note (Signed)
Chief Complaint   Chief Complaint  Patient presents with   Altered Mental Status    HPI   Consult requested by: Dr Gilford Raid Reason for consult: T8-9 osteomyelitis/discitis  HPI: NUSSEN PULLIN is a 85 y.o. male with history of Afib on pradaxa, HTN who presented to ED earlier today due to episode of word finding difficulties.  Reports going for a walk and upon returning felt lightheaded and had difficulties with word finding.   Describes the inability to say what he wanted to say.   He called his daughter who came by his house. She noted that he was unable to remember her name.  He was brought to the emergency room for further evaluation.  Upon arrival to ED symptoms had resolved.  As part of workup he had lab work done, a CT scan of his head in addition to an MRI as well as a chest x-ray.  Chest x-ray was concerning for possible aneurysmal dilation of the thoracic aorta which led to a CTA and ultimately an MRI of the thoracic spine after it was noted that he had possible T8-9 osteomyelitis/ diskitis.  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  Currently he feels well with the exception of mild lightheadedness.  He does endorse intermittent lower back pain particularly when he 1st wakes up in the morning but once he starts moving the pain resolves.  He denies pain or numbness/tingling in bilateral lower extremities, weakness or bowel/bladder dysfunction.     Patient Active Problem List   Diagnosis Date Noted   Puncture wound of right lower leg without foreign body 10/08/2013   Mitral insufficiency 12/28/2010   Atrial fibrillation ([place]) 11/16/2010   Hypertension    GERD 04/22/2010   PERSONAL HX COLONIC POLYPS 04/22/2010    PMH: Past Medical History:  Diagnosis Date   AF (atrial fibrillation) (HCC)    Asthma    Detached retina    Diverticulosis    GERD (gastroesophageal reflux disease)    [name] syndrome    Hypertension     Mitral insufficiency    Renal calculi     PSH: Past Surgical History:  Procedure Laterality Date   cataract surgery     ESOPHAGEAL DILATION     In the past   FINGER TENDON REPAIR     INGUINAL HERNIA REPAIR     KIDNEY STONE SURGERY     Status post syrgical removal of a    TONSILLECTOMY      (Not in a hospital admission)   SH: Social History   Tobacco Use   Smoking status: Never Smoker   Smokeless tobacco: Never Used  Substance Use Topics   Alcohol use: Yes    Alcohol/week: 0.0 standard drinks    Comment: rarely   Drug use: Never    MEDS: Prior to Admission medications   Medication Sig Start Date End Date Taking? Authorizing Provider  losartan (COZAAR) 100 MG tablet Take 50 mg by mouth at bedtime.    Yes [provider]  aspirin 81 MG chewable tablet Chew 1 tablet (81 mg total) by mouth daily. 06/08/18   Tommie Raymond, MD  Cyanocobalamin (VITAMIN B12) 500 MCG TABS Take 500 mcg by mouth daily.    [provider]  dabigatran (PRADAXA) 150 MG CAPS Take 1 capsule (150 mg total) by mouth every 12 (twelve) hours. 01/18/12   Martinique, Peter M, MD  doxazosin (CARDURA) 4 MG tablet Take 2 mg by mouth every other day.  [provider]  Lactobacillus (PROBIOTIC ACIDOPHILUS PO) Take 1 capsule by mouth daily.     [provider]  latanoprost (XALATAN) 0.005 % ophthalmic solution Place 1 drop into both eyes at bedtime.  09/05/17   [provider]  Multiple Vitamin (MULTIVITAMIN WITH MINERALS) TABS Take 1 tablet by mouth daily before supper.     [provider]  Multiple Vitamins-Minerals (ICAPS AREDS FORMULA PO) Take 1 capsule by mouth 2 (two) times daily.     [provider]  pantoprazole (PROTONIX) 40 MG tablet Take 40 mg by mouth every morning.     [provider]  Polyethyl Glycol-Propyl Glycol (SYSTANE) 0.4-0.3 % SOLN Place 1 drop into both eyes at bedtime.     [provider]  vitamin C  (ASCORBIC ACID) 500 MG tablet Take 500 mg by mouth daily before supper.     [provider]    ALLERGY: No Known Allergies  Social History   Tobacco Use   Smoking status: Never Smoker   Smokeless tobacco: Never Used  Substance Use Topics   Alcohol use: Yes    Alcohol/week: 0.0 standard drinks    Comment: rarely     Family History  Problem Relation Age of Onset   Hypertension Mother      ROS   Review of Systems  Constitutional: Negative.   HENT: Negative.   Eyes: Negative.   Respiratory: Negative.   Cardiovascular: Negative.   Gastrointestinal: Negative.   Genitourinary: Negative.   Musculoskeletal: Positive for back pain (intermittent). Negative for falls, joint pain, myalgias and neck pain.  Skin: Negative.   Neurological: Positive for speech change (word finding difficultes - resolved). Negative for dizziness, tingling, tremors, sensory change, focal weakness, seizures, loss of consciousness, weakness and headaches.    Exam   Vitals:   08/23/18 1800 08/23/18 2002  BP: (!) 180/118 (!) 163/71  Pulse: 87 75  Resp: 17 20  Temp:    SpO2: 97% 96%   General appearance: elderly male, resting comfortably, nontoxic Eyes: No scleral injection Cardiovascular: Regular rate and rhythm without murmurs, rubs, gallops. No edema or variciosities. Distal pulses normal. Pulmonary: Effort normal, non-labored breathing Musculoskeletal:     Muscle tone upper extremities: Normal    Muscle tone lower extremities: Normal    Motor exam: Upper Extremities Deltoid Bicep Tricep Grip  Right 5/5 5/5 5/5 5/5  Left 5/5 5/5 5/5 5/5   Lower Extremity IP Quad PF DF EHL  Right 5/5 5/5 5/5 5/5 5/5  Left 5/5 5/5 5/5 5/5 5/5   Neurological Mental Status:    - Patient is awake, alert, oriented to person, place, month, year, and situation    - Patient is able to give a clear and coherent history.    - No signs of aphasia or neglect Cranial Nerves    - II: Visual Fields are  full. PERRL    - III/IV/VI: EOMI without ptosis or diploplia.     - V: Facial sensation is grossly normal    - VII: Facial movement is symmetric.     - VIII: hearing is intact to voice    - X: Uvula elevates symmetrically    - XI: Shoulder shrug is symmetric.    - XII: tongue is midline without atrophy or fasciculations.  Sensory: Sensation grossly intact to LT Deep Tendon Reflexes    - 3+ in the right biceps and patella. 2+ left biceps and patella.    - No clonus  Results -  Imaging/Labs   Results for orders placed or performed during the hospital encounter of 08/23/18 (from the past 48 hour(s))  Urinalysis, Routine w reflex microscopic     Status: None   Collection Time: 08/23/18 11:51 AM  Result Value Ref Range   Color, Urine YELLOW YELLOW   APPearance CLEAR CLEAR   Specific Gravity, Urine 1.012 1.005 - 1.030   pH 7.0 5.0 - 8.0   Glucose, UA NEGATIVE NEGATIVE mg/dL   Hgb urine dipstick NEGATIVE NEGATIVE   Bilirubin Urine NEGATIVE NEGATIVE   Ketones, ur NEGATIVE NEGATIVE mg/dL   Protein, ur NEGATIVE NEGATIVE mg/dL   Nitrite NEGATIVE NEGATIVE   Leukocytes,Ua NEGATIVE NEGATIVE    Comment: Performed at [facility] [address] [place], [place] [address]  CBC with Differential/Platelet     Status: Abnormal   Collection Time: 08/23/18 12:02 PM  Result Value Ref Range   WBC 4.8 4.0 - 10.5 K/uL   RBC 3.38 (L) 4.22 - 5.81 MIL/uL   Hemoglobin 10.5 (L) 13.0 - 17.0 g/dL   HCT 31.2 (L) 39.0 - 52.0 %   MCV 92.3 80.0 - 100.0 fL   MCH 31.1 26.0 - 34.0 pg   MCHC 33.7 30.0 - 36.0 g/dL   RDW 12.2 11.5 - 15.5 %   Platelets 219 150 - 400 K/uL   nRBC 0.0 0.0 - 0.2 %   Neutrophils Relative % 66 %   Neutro Abs 3.2 1.7 - 7.7 K/uL   Lymphocytes Relative 16 %   Lymphs Abs 0.8 0.7 - 4.0 K/uL   Monocytes Relative 14 %   Monocytes Absolute 0.7 0.1 - 1.0 K/uL   Eosinophils Relative 3 %   Eosinophils Absolute 0.1 0.0 - 0.5 K/uL   Basophils Relative 1 %   Basophils Absolute 0.1 0.0 -  0.1 K/uL   Immature Granulocytes 0 %   Abs Immature Granulocytes 0.02 0.00 - 0.07 K/uL    Comment: Performed at [facility] [address], [place], [place] [address]  Comprehensive metabolic panel     Status: Abnormal   Collection Time: 08/23/18 12:02 PM  Result Value Ref Range   Sodium 128 (L) 135 - 145 mmol/L   Potassium 4.2 3.5 - 5.1 mmol/L   Chloride 95 (L) 98 - 111 mmol/L   CO2 25 22 - 32 mmol/L   Glucose, Bld 98 70 - 99 mg/dL   BUN 18 8 - 23 mg/dL   Creatinine, Ser 1.20 0.61 - 1.24 mg/dL   Calcium 9.1 8.9 - 10.3 mg/dL   Total Protein 6.6 6.5 - 8.1 g/dL   Albumin 3.7 3.5 - 5.0 g/dL   AST 30 15 - 41 U/L   ALT 19 0 - 44 U/L   Alkaline Phosphatase 78 38 - 126 U/L   Total Bilirubin 1.4 (H) 0.3 - 1.2 mg/dL   GFR calc non Af Amer 51 (L) >60 mL/min   GFR calc Af Amer 60 (L) >60 mL/min   Anion gap 8 5 - 15    Comment: Performed at [facility] [address] [place], [place] [address]  Troponin I - ONCE - STAT     Status: None   Collection Time: 08/23/18 12:02 PM  Result Value Ref Range   Troponin I <0.03 <0.03 ng/mL    Comment: Performed at [facility], [place] [address], [place], [place] [address]    Dg Chest 2 View  Result Date: 08/23/2018 CLINICAL DATA:  Altered mental  status. EXAM: CHEST - 2 VIEW COMPARISON:  Chest x-rays dated 06/08/2018 and 03/02/2009. FINDINGS: Heart size and mediastinal contours are stable. Coarse interstitial markings again noted throughout both lungs, increased compared to the previous studies, suggesting acute interstitial edema superimposed on chronic interstitial lung disease. No confluent opacity to suggest consolidating pneumonia. No pleural effusion or pneumothorax seen. Retrocardiac density again noted, measuring approximately 8 cm width, not significantly changed compared to the previous study, compatible with aortic ectasia versus aneurysm, less likely hiatal hernia. IMPRESSION: 1. Coarse interstitial markings throughout both  lungs, increased compared to the previous studies, suggesting acute interstitial edema superimposed on chronic interstitial lung disease. No evidence of consolidating pneumonia. 2. Persistent density overlying the lower mediastinum, measuring approximately 8 cm width, with differential considerations of aneurysmal dilatation of the thoracic aorta versus ectasia of the thoracic aorta, less likely hiatal hernia. Recommend further characterization with chest CT if 1 has not been recently performed. Electronically Signed   By: Franki Cabot M.D.   On: 08/23/2018 15:10   Ct Head Wo Contrast  Result Date: 08/23/2018 CLINICAL DATA:  Altered level of consciousness. EXAM: CT HEAD WITHOUT CONTRAST TECHNIQUE: Contiguous axial images were obtained from the base of the skull through the vertex without intravenous contrast. COMPARISON:  06/08/2018 FINDINGS: Brain: Moderate atrophy. Moderate to extensive chronic white matter changes bilaterally appear stable from the prior study. No acute infarct, hemorrhage, mass. No midline shift Vascular: Negative for hyperdense vessel Skull: Negative Sinuses/Orbits: Mucosal edema paranasal sinuses. Bilateral ocular surgery Other: None IMPRESSION: Atrophy and chronic ischemia.  No acute abnormality. Electronically Signed   By: Franchot Gallo M.D.   On: 08/23/2018 12:46   Mr Brain Wo Contrast  Result Date: 08/23/2018 CLINICAL DATA:  Altered level consciousness. Difficulty speaking. Unsteadiness. EXAM: MRI HEAD WITHOUT CONTRAST TECHNIQUE: Multiplanar, multiecho pulse sequences of the brain and surrounding structures were obtained without intravenous contrast. COMPARISON:  Head CT 08/23/2018 and MRI 04/24/2012 FINDINGS: Brain: There is no evidence of acute infarct, intracranial hemorrhage, mass, midline shift, or extra-axial fluid collection. Confluent T2 hyperintensities in the cerebral white matter have progressed from the prior MRI and are particularly notable in the occipital and  parietal lobes without cortical involvement, nonspecific but compatible with severe chronic small vessel ischemic disease. There is moderate cerebral atrophy. Vascular: Major intracranial vascular flow voids are preserved. Skull and upper cervical spine: Unremarkable bone marrow signal. Sinuses/Orbits: Bilateral cataract extraction. Minimal bilateral maxillary sinus mucosal thickening. Trace right mastoid effusion. Other: None. IMPRESSION: 1. No acute intracranial abnormality. 2. Severe chronic small vessel ischemic disease, progressed from 2014. Electronically Signed   By: Logan Bores M.D.   On: 08/23/2018 15:08   Mr Thoracic Spine W Wo Contrast  Result Date: 08/23/2018 CLINICAL DATA:  Initial evaluation for abnormal CT, disc destruction at T8-9. EXAM: MRI THORACIC SPINE WITHOUT CONTRAST TECHNIQUE: Multiplanar, multisequence MR imaging of the thoracic spine was performed. No intravenous contrast was administered. CONTRAST:  5 cc of Gadavist. COMPARISON:  Prior CT from earlier the same day. FINDINGS: Alignment: Mild straightening of the normal mid thoracic kyphosis. No listhesis or malalignment. Vertebrae: Mild height loss at the superior endplates of T1 and T4 are chronic in appearance. Vertebral body height otherwise maintained without evidence for acute or subacute fracture. Underlying bone marrow signal intensity within normal limits. Few scattered benign hemangiomas noted. No worrisome osseous lesions. Severe loss of disc space height with endplate irregularity seen at the T8-9 level, corresponding with abnormality seen on prior CT. Associated fluid signal  intensity within the residual T8-9 disc with associated endplate irregularity. Abnormal marrow edema and enhancement within the adjacent T8 and T9 vertebral bodies. Findings compatible with acute osteomyelitis discitis. Edema and enhancement extends into the posterior elements bilaterally, with involvement of the T8-9 facets, which could reflect  associated septic arthropathy. Associated mild epidural enhancement at this level without frank epidural abscess or collection. Paraspinous phlegmon and edema seen adjacent to the T8-9 interspace without discrete paraspinous abscess. Resultant moderate to severe spinal stenosis at this level. No other evidence for acute infection within the thoracic spine. No other abnormal marrow edema or enhancement. Cord: Signal intensity within the thoracic spinal cord is within normal limits. Specifically, no cord signal abnormality seen at the level of T8-9. Paraspinal soft tissues: Paraspinous edema and phlegmon without discrete abscess or collection adjacent to the T8-9 interspace. Paraspinous soft tissues otherwise within normal limits. Trace layering bilateral pleural effusions with associated mild atelectasis. Atherosclerotic change noted within the visualized aorta. 5.7 cm exophytic simple right renal cyst partially visualized. Visualized visceral structures otherwise unremarkable. Disc levels: T1-2: Minimal disc bulge. Mild right greater than left facet hypertrophy. No significant spinal stenosis. Mild to moderate right with mild left foraminal narrowing. T2-3: Minimal disc bulge with bilateral facet hypertrophy. No significant stenosis. T3-4: Minimal disc bulge with bilateral facet hypertrophy, greater on the right. No significant spinal stenosis. Mild right foraminal narrowing. T4-5: Negative interspace. Mild facet hypertrophy. No significant spinal stenosis. Mild right foraminal narrowing. T5-6: Negative interspace. Bilateral facet hypertrophy with associated small joint effusions. No spinal stenosis. Mild bilateral foraminal narrowing. T6-7: Negative interspace. Mild facet hypertrophy. No significant stenosis. T7-8: Shallow central disc protrusion mildly flattens the ventral thecal sac. Mild facet hypertrophy. No significant spinal stenosis. Foramina remain patent. T8-9: Changes consistent with osteomyelitis  discitis with partial obliteration of the T8-9 interspace. Associated bilateral facet arthrosis with ligamentum flavum hypertrophy. Resultant moderate to severe spinal stenosis with mild flattening of the thoracic spinal cord. Thecal sac measures 5 mm in AP diameter at its most narrow point. No associated cord signal changes. Moderate to severe bilateral foraminal narrowing. T9-10: Negative interspace. Bilateral facet hypertrophy. No significant stenosis. T10-11: Negative interspace. Bilateral facet hypertrophy. No significant stenosis. T11-12: Mild annular disc bulge with facet hypertrophy. Mild spinal stenosis without cord impingement. Foramina remain patent. T12-L1:  Negative. IMPRESSION: 1. Findings consistent with acute osteomyelitis discitis at T8-9 as detailed above. Extension of edema into the posterior elements of T8-9 in about the bilateral T8-9 facets, which could reflect associated septic arthritis. Mild localized epidural enhancement without frank epidural abscess or collection. Resultant moderate to severe spinal stenosis at this level without cord signal changes. 2. Associated paraspinous phlegmon and enhancement adjacent to the T8-9 interspace without discrete paraspinous abscess or drainable fluid collection. 3. Mild multilevel degenerative spondylolysis elsewhere within the thoracic spine without significant spinal stenosis. Mild to moderate bilateral foraminal narrowing within the upper thoracic spine as detailed above. Electronically Signed   By: Jeannine Boga M.D.   On: 08/23/2018 19:59   Ct Angio Chest/abd/pel For Dissection W And/or Wo Contrast  Result Date: 08/23/2018 CLINICAL DATA:  85 year old with chest and back pain. Evaluate for aortic dissection. EXAM: CT ANGIOGRAPHY CHEST, ABDOMEN AND PELVIS TECHNIQUE: Multidetector CT imaging through the chest, abdomen and pelvis was performed using the standard protocol during bolus administration of intravenous contrast. Multiplanar  reconstructed images and MIPs were obtained and reviewed to evaluate the vascular anatomy. CONTRAST:  161mL OMNIPAQUE IOHEXOL 350 MG/ML SOLN COMPARISON:  Neck CTA  06/08/2018 FINDINGS: CTA CHEST FINDINGS Cardiovascular: Normal caliber of the thoracic aorta with dissection. Atherosclerotic calcifications throughout the thoracic aorta. Great vessels are patent without dissection. Pulmonary arteries are patent without filling defects or pulmonary emboli. No significant pericardial fluid. There are coronary artery calcifications. Mediastinum/Nodes: Subcarinal tissue is prominent measuring up to 1.5 cm on sequence 6, image 46. Mildly prominent lymph nodes in the AP window. AP window lymph node measuring 1.1 cm on sequence 6, image 36. Mildly prominent right hilar soft tissue. No axillary lymph node enlargement. Lungs/Pleura: Trachea and mainstem bronchi are patent. No large pleural effusions. No large areas of consolidation but there are prominent interstitial densities in the right lower lobe. Findings could be a combination of scarring and atelectasis. Mild thickening along the right major fissure on sequence 8, image 51 that is nonspecific. At least 2 small nodules in the right lower lobe, measuring 5 mm on sequence 6, image 44 and image 57. Pulmonary nodules are likely incidental findings. No suspicious pulmonary nodules. Musculoskeletal: There is marked abnormality at the T8-T9 disc space. There appears to be destruction of the disc space with destruction of the T8 inferior endplate and the T9 superior endplate. In addition, there is extensive osteophytosis or heterotopic bone formation in this region. Increased soft tissue at the level of T8-T9. Findings are concerning for underlying infectious or inflammatory process. Question mild superior compression deformity at T4. Review of the MIP images confirms the above findings. CTA ABDOMEN AND PELVIS FINDINGS VASCULAR Aorta: Atherosclerotic calcifications in the  abdominal aorta without aneurysm or dissection. Celiac: Patent without evidence of aneurysm, dissection, vasculitis or significant stenosis. 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 SMA is patent. Renals: Both renal arteries are patent without evidence of aneurysm, dissection, vasculitis, fibromuscular dysplasia or significant stenosis. IMA: IMA is patent. Inflow: Patent without evidence of aneurysm, dissection, vasculitis or significant stenosis. Veins: No obvious venous abnormality within the limitations of this arterial phase study. Incidentally, there is a retroaortic left renal vein. Review of the MIP images confirms the above findings. NON-VASCULAR Hepatobiliary: High-density material in the gallbladder is compatible with stones. No evidence for gallbladder inflammation. No acute abnormality to the liver. Pancreas: Artifact in the region of the pancreas but no gross abnormality. Spleen: Motion artifact in this region but no gross abnormality. Adrenals/Urinary Tract: Normal appearance of the adrenal glands. Large low-density structure along the right kidney upper pole is most compatible with a cyst measuring up to 6.6 cm. Negative for hydronephrosis. No suspicious renal lesions. Urinary bladder is displaced superiorly with wall thickening along the right side of the bladder. Bladder wall thickening is noted on sequence 6, image 159. Stomach/Bowel: Normal appendix. Normal appearance of the stomach. No evidence for bowel dilatation. No focal bowel inflammation. Lymphatic: No significant lymph node enlargement in the abdomen or pelvis. Reproductive: Prostate is markedly enlarged measuring roughly 6.3 x 5.9 x 8.8 cm. Other: Negative for free fluid. Negative for free air. Evidence for defect along the lateral left abdominal wall on sequence 6, image 116 that may be related to a fat containing hernia. Musculoskeletal: Both hips are located. Complete disc space loss at L2-L3.  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 Review of the MIP images confirms the above findings. IMPRESSION: 1. Negative for an aortic aneurysm or dissection. Aortic Atherosclerosis (ICD10-I70.0). 2. Destruction of the disc space at T8-T9 is suggestive for osteomyelitis and discitis at this level. Complete disc space loss at L2-L3 could  represent degenerative disc disease but cannot exclude discitis based on the findings at T8-T9. Recommend further characterization of the thoracolumbar spine with MRI. 3. Mild mediastinal lymphadenopathy is nonspecific but may be reactive. 4. Cholelithiasis. 5. Prostate enlargement. Wall thickening along the right side of the bladder is nonspecific and could be related to bladder outlet obstruction. 6. Scattered interstitial thickening in the right lower lobe may represent a combination of atelectasis and chronic changes. Small nodular densities in lungs, largest measuring 5 mm, are likely incidental findings. 7. 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 These results were called by telephone at the time of interpretation on 08/23/2018 at 5:38 pm to Dr. Isla Pence, who verbally acknowledged these results. Electronically Signed   By: Markus Daft M.D.   On: 08/23/2018 17:39   Impression/Plan   85 y.o. male with acute osteomyelitis diskitis at T8-9 and associated bilateral facet arthritis with ligamentum flavum hypertrophy, with resultant moderate to severe spinal stenosis.   There is is mild localized epidural enhancement without frank epidural abscess.  No cord signal changes.   There is also associated paraspinous phlegmon and enhancement adjacent to the interspace. He is neurologically intact on exam although does show signs of myelopathy with mild hyperreflexia.  Patient is overall asymptomatic from this.   I had a long discussion with the patient regarding his MRI findings.  Due to multifactorial moderate to severe spinal stenosis at T8-9, we could proceed with  laminectomy  for decompression on a nonemergent basis.  After discussing the risks, benefits and alternatives to the procedure patient stated he is not interested in any type of surgical intervention given his advanced age.  I did discuss the risks of possible paralysis if left untreated.  He states understanding and remains against any surgical intervention.  He is going to be admitted under [facility] for management and treatment.     - Recommend IR consult for possible aspiration/bx.     - ID consult for antibiotics.  Do not believe he needs antibiotics at this time as he does not appear toxic and vitals are stable.  Ferne Reus, PA-C [facility] [employer]

## 2018-08-23 NOTE — ED Notes (Signed)
Patient transported to MRI 

## 2018-08-23 NOTE — ED Provider Notes (Signed)
[facility] EMERGENCY DEPARTMENT Provider Note   CSN: [id number] Arrival date & time: 08/23/18  1122     History   Chief Complaint Chief Complaint  Patient presents with  . Altered Mental Status    HPI Cody Mendoza is a 85 y.o. male history of A. fib on Pradaxa, hypertension here presenting with trouble speaking.  Patient states that he lives at home with his wife.  He was going to the mailbox around 1030 am this morning and noticed that he had some trouble speaking.  He immediately called his daughter who went to his house.  By the time she went there, he was apparently slumped over but was having clear speech.  She was able to get him to walk to the car and they came over for evaluation. He has no fevers or chills or falls.  He was noted to be very hypertensive about 200/100 in triage.  He did take an extra dose of his losartan today.  Patient had similar episode in March and had a CTA that showed some stenosis of the vertebral artery but no dissection.      The history is provided by the patient and a relative.    Past Medical History:  Diagnosis Date  . AF (atrial fibrillation) ([place])   . Asthma   . Detached retina   . Diverticulosis   . GERD (gastroesophageal reflux disease)   . [name] syndrome   . Hypertension   . Mitral insufficiency   . Renal calculi     Patient Active Problem List   Diagnosis Date Noted  . Puncture wound of right lower leg without foreign body 10/08/2013  . Mitral insufficiency 12/28/2010  . Atrial fibrillation ([place]) 11/16/2010  . Hypertension   . GERD 04/22/2010  . PERSONAL HX COLONIC POLYPS 04/22/2010    Past Surgical History:  Procedure Laterality Date  . cataract surgery    . ESOPHAGEAL DILATION     In the past  . FINGER TENDON REPAIR    . INGUINAL HERNIA REPAIR    . KIDNEY STONE SURGERY     Status post syrgical removal of a   . TONSILLECTOMY          Home Medications    Prior to Admission medications    Medication Sig Start Date End Date Taking? Authorizing Provider  losartan (COZAAR) 100 MG tablet Take 50 mg by mouth at bedtime.    Yes [provider]  aspirin 81 MG chewable tablet Chew 1 tablet (81 mg total) by mouth daily. 06/08/18   Tommie Raymond, MD  Cyanocobalamin (VITAMIN B12) 500 MCG TABS Take 500 mcg by mouth daily.    [provider]  dabigatran (PRADAXA) 150 MG CAPS Take 1 capsule (150 mg total) by mouth every 12 (twelve) hours. 01/18/12   [place], Peter M, MD  doxazosin (CARDURA) 4 MG tablet Take 2 mg by mouth every other day.     [provider]  Lactobacillus (PROBIOTIC ACIDOPHILUS PO) Take 1 capsule by mouth daily.     [provider]  latanoprost (XALATAN) 0.005 % ophthalmic solution Place 1 drop into both eyes at bedtime.  09/05/17   [provider]  Multiple Vitamin (MULTIVITAMIN WITH MINERALS) TABS Take 1 tablet by mouth daily before supper.     [provider]  Multiple Vitamins-Minerals (ICAPS AREDS FORMULA PO) Take 1 capsule by mouth 2 (two) times daily.     [provider]  pantoprazole (PROTONIX) 40 MG tablet  Take 40 mg by mouth every morning.     [provider]  Polyethyl Glycol-Propyl Glycol (SYSTANE) 0.4-0.3 % SOLN Place 1 drop into both eyes at bedtime.     [provider]  vitamin C (ASCORBIC ACID) 500 MG tablet Take 500 mg by mouth daily before supper.     [provider]    Family History Family History  Problem Relation Age of Onset  . Hypertension Mother     Social History Social History   Tobacco Use  . Smoking status: Never Smoker  . Smokeless tobacco: Never Used  Substance Use Topics  . Alcohol use: Yes    Alcohol/week: 0.0 standard drinks    Comment: rarely  . Drug use: Never     Allergies   Patient has no known allergies.   Review of Systems Review of Systems  Neurological: Positive for speech difficulty.  All other systems reviewed and are negative.     Physical Exam Updated Vital Signs BP (!) 189/92   Pulse 80   Temp (!) 97.5 F (36.4 C)   Resp 14   Ht 5\' 7"  (1.702 m)   Wt 64.9 kg   SpO2 98%   BMI 22.40 kg/m   Physical Exam Vitals signs and nursing note reviewed.  Constitutional:      Comments: Chronically ill but not acutely ill   HENT:     Head: Normocephalic.     Nose: Nose normal.     Mouth/Throat:     Mouth: Mucous membranes are moist.  Eyes:     Extraocular Movements: Extraocular movements intact.     Pupils: Pupils are equal, round, and reactive to light.  Neck:     Musculoskeletal: Normal range of motion.  Cardiovascular:     Rate and Rhythm: Normal rate and regular rhythm.     Pulses: Normal pulses.     Heart sounds: Normal heart sounds.  Pulmonary:     Effort: Pulmonary effort is normal.     Breath sounds: Normal breath sounds.  Abdominal:     General: Abdomen is flat.     Palpations: Abdomen is soft.  Musculoskeletal: Normal range of motion.  Skin:    General: Skin is warm.     Capillary Refill: Capillary refill takes less than 2 seconds.  Neurological:     General: No focal deficit present.     Mental Status: He is oriented to person, place, and time.     Comments: CN 2- 12 intact, nl strength and sensation throughout       ED Treatments / Results  Labs (all labs ordered are listed, but only abnormal results are displayed) Labs Reviewed  CBC WITH DIFFERENTIAL/PLATELET - Abnormal; Notable for the following components:      Result Value   RBC 3.38 (*)    Hemoglobin 10.5 (*)    HCT 31.2 (*)    All other components within normal limits  COMPREHENSIVE METABOLIC PANEL - Abnormal; Notable for the following components:   Sodium 128 (*)    Chloride 95 (*)    Total Bilirubin 1.4 (*)    GFR calc non Af Amer 51 (*)    GFR calc Af Amer 60 (*)    All other components within normal limits  TROPONIN I  URINALYSIS, ROUTINE W REFLEX MICROSCOPIC    EKG EKG Interpretation  Date/Time:  Thursday  August 23 2018 11:50:48 EDT Ventricular Rate:  78 PR Interval:    QRS Duration: 81 QT Interval:  403 QTC Calculation: 459 R Axis:   -38 Text Interpretation:  Atrial fibrillation Inferior infarct, old Probable anteroseptal infarct, old No significant change since last tracing Confirmed by Wandra Arthurs [phone]) on 08/23/2018 12:03:09 PM   Radiology Ct Head Wo Contrast  Result Date: 08/23/2018 CLINICAL DATA:  Altered level of consciousness. EXAM: CT HEAD WITHOUT CONTRAST TECHNIQUE: Contiguous axial images were obtained from the base of the skull through the vertex without intravenous contrast. COMPARISON:  06/08/2018 FINDINGS: Brain: Moderate atrophy. Moderate to extensive chronic white matter changes bilaterally appear stable from the prior study. No acute infarct, hemorrhage, mass. No midline shift Vascular: Negative for hyperdense vessel Skull: Negative Sinuses/Orbits: Mucosal edema paranasal sinuses. Bilateral ocular surgery Other: None IMPRESSION: Atrophy and chronic ischemia.  No acute abnormality. Electronically Signed   By: Franchot Gallo M.D.   On: 08/23/2018 12:46    Procedures Procedures (including critical care time)  Medications Ordered in ED Medications - No data to display   Initial Impression / Assessment and Plan / ED Course  I have reviewed the triage vital signs and the nursing notes.  Pertinent labs & imaging results that were available during my care of the patient were reviewed by me and considered in my medical decision making (see chart for details).       Cody Mendoza is a 85 y.o. male here with AMS. Patient had some trouble speaking that resolved. Consider electrolyte abnormality vs infection vs TIA vs small stroke. Will get labs, UA, CXR, MRI brain.   3:18 PM MRi brain unremarkable. Labs and UA normal. CXR showed possible thoracic aneurysm. Patient hypertensive to 190/90 on arrival. Ordered hydralazine. Will get CTA dissection study for further evaluation.  Signed out to Dr. Gilford Raid in the ED. If dissection study is negative, anticipate dc home.     Final Clinical Impressions(s) / ED Diagnoses   Final diagnoses:  None    ED Discharge Orders    None       Drenda Freeze, MD 08/23/18 [phone]

## 2018-08-23 NOTE — ED Notes (Signed)
ED TO INPATIENT HANDOFF REPORT  ED Nurse Name and Phone #:  Hernandez Losasso [phone]  S Name/Age/Gender Cody Mendoza 85 y.o. male Room/Bed: 027C/027C  Code Status   Code Status: Not on file  Home/SNF/Other Home Patient oriented to: self, time and situation Is this baseline? Yes   Triage Complete: Triage complete  Chief Complaint AMS  Triage Note Pt BIB family for altered mental status. Per patient he felt fine this morning when he woke up and then when he was walking back from the mailbox he felt unsteady. Per patient's family patient then had some difficulty speaking and couldn't remember his daughter's name. Pt is alert and oriented to person and time at present. Pt is unsure of what hospital he is at.    Allergies No Known Allergies  Level of Care/Admitting Diagnosis ED Disposition    ED Disposition Condition Comment   Admit  Hospital Area: [facility] [[id number]]  Level of Care: Telemetry Medical [104]  Covid Evaluation: Screening Protocol (No Symptoms)  Diagnosis: Discitis [[id number]]  Admitting Physician: [name] [phone]  Attending Physician: [name] [phone]  Estimated length of stay: past midnight tomorrow  Certification:: I certify this patient will need inpatient services for at least 2 midnights  PT Class (Do Not Modify): Inpatient [101]  PT Acc Code (Do Not Modify): Private [1]       B Medical/Surgery History Past Medical History:  Diagnosis Date  . AF (atrial fibrillation) ([place])   . Asthma   . Detached retina   . Diverticulosis   . GERD (gastroesophageal reflux disease)   . [name] syndrome   . Hypertension   . Mitral insufficiency   . Renal calculi    Past Surgical History:  Procedure Laterality Date  . cataract surgery    . ESOPHAGEAL DILATION     In the past  . FINGER TENDON REPAIR    . INGUINAL HERNIA REPAIR    . KIDNEY STONE SURGERY     Status post syrgical removal of a   . TONSILLECTOMY       A IV  Location/Drains/Wounds Patient Lines/Drains/Airways Status   Active Line/Drains/Airways    Name:   Placement date:   Placement time:   Site:   Days:   Peripheral IV 08/23/18 Right Antecubital   08/23/18    1601    Antecubital   less than 1          Intake/Output Last 24 hours No intake or output data in the 24 hours ending 08/23/18 2201  Labs/Imaging Results for orders placed or performed during the hospital encounter of 08/23/18 (from the past 48 hour(s))  Urinalysis, Routine w reflex microscopic     Status: None   Collection Time: 08/23/18 11:51 AM  Result Value Ref Range   Color, Urine YELLOW YELLOW   APPearance CLEAR CLEAR   Specific Gravity, Urine 1.012 1.005 - 1.030   pH 7.0 5.0 - 8.0   Glucose, UA NEGATIVE NEGATIVE mg/dL   Hgb urine dipstick NEGATIVE NEGATIVE   Bilirubin Urine NEGATIVE NEGATIVE   Ketones, ur NEGATIVE NEGATIVE mg/dL   Protein, ur NEGATIVE NEGATIVE mg/dL   Nitrite NEGATIVE NEGATIVE   Leukocytes,Ua NEGATIVE NEGATIVE    Comment: Performed at [facility], [address], [place], [place] [address]  CBC with Differential/Platelet     Status: Abnormal   Collection Time: 08/23/18 12:02 PM  Result Value Ref Range   WBC 4.8 4.0 - 10.5 K/uL   RBC  3.38 (L) 4.22 - 5.81 MIL/uL   Hemoglobin 10.5 (L) 13.0 - 17.0 g/dL   HCT 31.2 (L) 39.0 - 52.0 %   MCV 92.3 80.0 - 100.0 fL   MCH 31.1 26.0 - 34.0 pg   MCHC 33.7 30.0 - 36.0 g/dL   RDW 12.2 11.5 - 15.5 %   Platelets 219 150 - 400 K/uL   nRBC 0.0 0.0 - 0.2 %   Neutrophils Relative % 66 %   Neutro Abs 3.2 1.7 - 7.7 K/uL   Lymphocytes Relative 16 %   Lymphs Abs 0.8 0.7 - 4.0 K/uL   Monocytes Relative 14 %   Monocytes Absolute 0.7 0.1 - 1.0 K/uL   Eosinophils Relative 3 %   Eosinophils Absolute 0.1 0.0 - 0.5 K/uL   Basophils Relative 1 %   Basophils Absolute 0.1 0.0 - 0.1 K/uL   Immature Granulocytes 0 %   Abs Immature Granulocytes 0.02 0.00 - 0.07 K/uL    Comment: Performed at [facility] [address] [place], [place] [address]  Comprehensive metabolic panel     Status: Abnormal   Collection Time: 08/23/18 12:02 PM  Result Value Ref Range   Sodium 128 (L) 135 - 145 mmol/L   Potassium 4.2 3.5 - 5.1 mmol/L   Chloride 95 (L) 98 - 111 mmol/L   CO2 25 22 - 32 mmol/L   Glucose, Bld 98 70 - 99 mg/dL   BUN 18 8 - 23 mg/dL   Creatinine, Ser 1.20 0.61 - 1.24 mg/dL   Calcium 9.1 8.9 - 10.3 mg/dL   Total Protein 6.6 6.5 - 8.1 g/dL   Albumin 3.7 3.5 - 5.0 g/dL   AST 30 15 - 41 U/L   ALT 19 0 - 44 U/L   Alkaline Phosphatase 78 38 - 126 U/L   Total Bilirubin 1.4 (H) 0.3 - 1.2 mg/dL   GFR calc non Af Amer 51 (L) >60 mL/min   GFR calc Af Amer 60 (L) >60 mL/min   Anion gap 8 5 - 15    Comment: Performed at [facility] [address], [place], [place] [address]  Troponin I - ONCE - STAT     Status: None   Collection Time: 08/23/18 12:02 PM  Result Value Ref Range   Troponin I <0.03 <0.03 ng/mL    Comment: Performed at [facility], [place] [address], [place], [place] [address]   Dg Chest 2 View  Result Date: 08/23/2018 CLINICAL DATA:  Altered mental status. EXAM: CHEST - 2 VIEW COMPARISON:  Chest x-rays dated 06/08/2018 and 03/02/2009. FINDINGS: Heart size and mediastinal contours are stable. Coarse interstitial markings again noted throughout both lungs, increased compared to the previous studies, suggesting acute interstitial edema superimposed on chronic interstitial lung disease. No confluent opacity to suggest consolidating pneumonia. No pleural effusion or pneumothorax seen. Retrocardiac density again noted, measuring approximately 8 cm width, not significantly changed compared to the previous study, compatible with aortic ectasia versus aneurysm, less likely hiatal hernia. IMPRESSION: 1. Coarse interstitial markings throughout both lungs, increased compared to the previous studies, suggesting acute interstitial edema superimposed on chronic interstitial lung disease. No  evidence of consolidating pneumonia. 2. Persistent density overlying the lower mediastinum, measuring approximately 8 cm width, with differential considerations of aneurysmal dilatation of the thoracic aorta versus ectasia of the thoracic aorta, less likely hiatal hernia. Recommend further characterization with chest CT if 1 has not been recently performed. Electronically Signed   By:  Franki Cabot M.D.   On: 08/23/2018 15:10   Ct Head Wo Contrast  Result Date: 08/23/2018 CLINICAL DATA:  Altered level of consciousness. EXAM: CT HEAD WITHOUT CONTRAST TECHNIQUE: Contiguous axial images were obtained from the base of the skull through the vertex without intravenous contrast. COMPARISON:  06/08/2018 FINDINGS: Brain: Moderate atrophy. Moderate to extensive chronic white matter changes bilaterally appear stable from the prior study. No acute infarct, hemorrhage, mass. No midline shift Vascular: Negative for hyperdense vessel Skull: Negative Sinuses/Orbits: Mucosal edema paranasal sinuses. Bilateral ocular surgery Other: None IMPRESSION: Atrophy and chronic ischemia.  No acute abnormality. Electronically Signed   By: Franchot Gallo M.D.   On: 08/23/2018 12:46   Mr Brain Wo Contrast  Result Date: 08/23/2018 CLINICAL DATA:  Altered level consciousness. Difficulty speaking. Unsteadiness. EXAM: MRI HEAD WITHOUT CONTRAST TECHNIQUE: Multiplanar, multiecho pulse sequences of the brain and surrounding structures were obtained without intravenous contrast. COMPARISON:  Head CT 08/23/2018 and MRI 04/24/2012 FINDINGS: Brain: There is no evidence of acute infarct, intracranial hemorrhage, mass, midline shift, or extra-axial fluid collection. Confluent T2 hyperintensities in the cerebral white matter have progressed from the prior MRI and are particularly notable in the occipital and parietal lobes without cortical involvement, nonspecific but compatible with severe chronic small vessel ischemic disease. There is moderate  cerebral atrophy. Vascular: Major intracranial vascular flow voids are preserved. Skull and upper cervical spine: Unremarkable bone marrow signal. Sinuses/Orbits: Bilateral cataract extraction. Minimal bilateral maxillary sinus mucosal thickening. Trace right mastoid effusion. Other: None. IMPRESSION: 1. No acute intracranial abnormality. 2. Severe chronic small vessel ischemic disease, progressed from 2014. Electronically Signed   By: Logan Bores M.D.   On: 08/23/2018 15:08   Mr Thoracic Spine W Wo Contrast  Result Date: 08/23/2018 CLINICAL DATA:  Initial evaluation for abnormal CT, disc destruction at T8-9. EXAM: MRI THORACIC SPINE WITHOUT CONTRAST TECHNIQUE: Multiplanar, multisequence MR imaging of the thoracic spine was performed. No intravenous contrast was administered. CONTRAST:  5 cc of Gadavist. COMPARISON:  Prior CT from earlier the same day. FINDINGS: Alignment: Mild straightening of the normal mid thoracic kyphosis. No listhesis or malalignment. Vertebrae: Mild height loss at the superior endplates of T1 and T4 are chronic in appearance. Vertebral body height otherwise maintained without evidence for acute or subacute fracture. Underlying bone marrow signal intensity within normal limits. Few scattered benign hemangiomas noted. No worrisome osseous lesions. Severe loss of disc space height with endplate irregularity seen at the T8-9 level, corresponding with abnormality seen on prior CT. Associated fluid signal intensity within the residual T8-9 disc with associated endplate irregularity. Abnormal marrow edema and enhancement within the adjacent T8 and T9 vertebral bodies. Findings compatible with acute osteomyelitis discitis. Edema and enhancement extends into the posterior elements bilaterally, with involvement of the T8-9 facets, which could reflect associated septic arthropathy. Associated mild epidural enhancement at this level without frank epidural abscess or collection. Paraspinous phlegmon  and edema seen adjacent to the T8-9 interspace without discrete paraspinous abscess. Resultant moderate to severe spinal stenosis at this level. No other evidence for acute infection within the thoracic spine. No other abnormal marrow edema or enhancement. Cord: Signal intensity within the thoracic spinal cord is within normal limits. Specifically, no cord signal abnormality seen at the level of T8-9. Paraspinal soft tissues: Paraspinous edema and phlegmon without discrete abscess or collection adjacent to the T8-9 interspace. Paraspinous soft tissues otherwise within normal limits. Trace layering bilateral pleural effusions with associated mild atelectasis. Atherosclerotic change noted within the visualized  aorta. 5.7 cm exophytic simple right renal cyst partially visualized. Visualized visceral structures otherwise unremarkable. Disc levels: T1-2: Minimal disc bulge. Mild right greater than left facet hypertrophy. No significant spinal stenosis. Mild to moderate right with mild left foraminal narrowing. T2-3: Minimal disc bulge with bilateral facet hypertrophy. No significant stenosis. T3-4: Minimal disc bulge with bilateral facet hypertrophy, greater on the right. No significant spinal stenosis. Mild right foraminal narrowing. T4-5: Negative interspace. Mild facet hypertrophy. No significant spinal stenosis. Mild right foraminal narrowing. T5-6: Negative interspace. Bilateral facet hypertrophy with associated small joint effusions. No spinal stenosis. Mild bilateral foraminal narrowing. T6-7: Negative interspace. Mild facet hypertrophy. No significant stenosis. T7-8: Shallow central disc protrusion mildly flattens the ventral thecal sac. Mild facet hypertrophy. No significant spinal stenosis. Foramina remain patent. T8-9: Changes consistent with osteomyelitis discitis with partial obliteration of the T8-9 interspace. Associated bilateral facet arthrosis with ligamentum flavum hypertrophy. Resultant moderate to  severe spinal stenosis with mild flattening of the thoracic spinal cord. Thecal sac measures 5 mm in AP diameter at its most narrow point. No associated cord signal changes. Moderate to severe bilateral foraminal narrowing. T9-10: Negative interspace. Bilateral facet hypertrophy. No significant stenosis. T10-11: Negative interspace. Bilateral facet hypertrophy. No significant stenosis. T11-12: Mild annular disc bulge with facet hypertrophy. Mild spinal stenosis without cord impingement. Foramina remain patent. T12-L1:  Negative. IMPRESSION: 1. Findings consistent with acute osteomyelitis discitis at T8-9 as detailed above. Extension of edema into the posterior elements of T8-9 in about the bilateral T8-9 facets, which could reflect associated septic arthritis. Mild localized epidural enhancement without frank epidural abscess or collection. Resultant moderate to severe spinal stenosis at this level without cord signal changes. 2. Associated paraspinous phlegmon and enhancement adjacent to the T8-9 interspace without discrete paraspinous abscess or drainable fluid collection. 3. Mild multilevel degenerative spondylolysis elsewhere within the thoracic spine without significant spinal stenosis. Mild to moderate bilateral foraminal narrowing within the upper thoracic spine as detailed above. Electronically Signed   By: Jeannine Boga M.D.   On: 08/23/2018 19:59   Ct Angio Chest/abd/pel For Dissection W And/or Wo Contrast  Result Date: 08/23/2018 CLINICAL DATA:  85 year old with chest and back pain. Evaluate for aortic dissection. EXAM: CT ANGIOGRAPHY CHEST, ABDOMEN AND PELVIS TECHNIQUE: Multidetector CT imaging through the chest, abdomen and pelvis was performed using the standard protocol during bolus administration of intravenous contrast. Multiplanar reconstructed images and MIPs were obtained and reviewed to evaluate the vascular anatomy. CONTRAST:  11mL OMNIPAQUE IOHEXOL 350 MG/ML SOLN COMPARISON:  Neck  CTA 06/08/2018 FINDINGS: CTA CHEST FINDINGS Cardiovascular: Normal caliber of the thoracic aorta with dissection. Atherosclerotic calcifications throughout the thoracic aorta. Great vessels are patent without dissection. Pulmonary arteries are patent without filling defects or pulmonary emboli. No significant pericardial fluid. There are coronary artery calcifications. Mediastinum/Nodes: Subcarinal tissue is prominent measuring up to 1.5 cm on sequence 6, image 46. Mildly prominent lymph nodes in the AP window. AP window lymph node measuring 1.1 cm on sequence 6, image 36. Mildly prominent right hilar soft tissue. No axillary lymph node enlargement. Lungs/Pleura: Trachea and mainstem bronchi are patent. No large pleural effusions. No large areas of consolidation but there are prominent interstitial densities in the right lower lobe. Findings could be a combination of scarring and atelectasis. Mild thickening along the right major fissure on sequence 8, image 51 that is nonspecific. At least 2 small nodules in the right lower lobe, measuring 5 mm on sequence 6, image 44 and image 57. Pulmonary nodules are  likely incidental findings. No suspicious pulmonary nodules. Musculoskeletal: There is marked abnormality at the T8-T9 disc space. There appears to be destruction of the disc space with destruction of the T8 inferior endplate and the T9 superior endplate. In addition, there is extensive osteophytosis or heterotopic bone formation in this region. Increased soft tissue at the level of T8-T9. Findings are concerning for underlying infectious or inflammatory process. Question mild superior compression deformity at T4. Review of the MIP images confirms the above findings. CTA ABDOMEN AND PELVIS FINDINGS VASCULAR Aorta: Atherosclerotic calcifications in the abdominal aorta without aneurysm or dissection. Celiac: Patent without evidence of aneurysm, dissection, vasculitis or significant stenosis. SMA: There is 50-60%  stenosis in the SMA approximately 1 cm from the origin. SMA is patent. Renals: Both renal arteries are patent without evidence of aneurysm, dissection, vasculitis, fibromuscular dysplasia or significant stenosis. IMA: IMA is patent. Inflow: Patent without evidence of aneurysm, dissection, vasculitis or significant stenosis. Veins: No obvious venous abnormality within the limitations of this arterial phase study. Incidentally, there is a retroaortic left renal vein. Review of the MIP images confirms the above findings. NON-VASCULAR Hepatobiliary: High-density material in the gallbladder is compatible with stones. No evidence for gallbladder inflammation. No acute abnormality to the liver. Pancreas: Artifact in the region of the pancreas but no gross abnormality. Spleen: Motion artifact in this region but no gross abnormality. Adrenals/Urinary Tract: Normal appearance of the adrenal glands. Large low-density structure along the right kidney upper pole is most compatible with a cyst measuring up to 6.6 cm. Negative for hydronephrosis. No suspicious renal lesions. Urinary bladder is displaced superiorly with wall thickening along the right side of the bladder. Bladder wall thickening is noted on sequence 6, image 159. Stomach/Bowel: Normal appendix. Normal appearance of the stomach. No evidence for bowel dilatation. No focal bowel inflammation. Lymphatic: No significant lymph node enlargement in the abdomen or pelvis. Reproductive: Prostate is markedly enlarged measuring roughly 6.3 x 5.9 x 8.8 cm. Other: Negative for free fluid. Negative for free air. Evidence for defect along the lateral left abdominal wall on sequence 6, image 116 that may be related to a fat containing hernia. Musculoskeletal: Both hips are located. Complete disc space loss at L2-L3. 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 Review of the MIP images confirms the above findings. IMPRESSION: 1. Negative for an aortic aneurysm or  dissection. Aortic Atherosclerosis (ICD10-I70.0). 2. Destruction of the disc space at T8-T9 is suggestive for osteomyelitis and discitis at this level. Complete disc space loss at L2-L3 could represent degenerative disc disease but cannot exclude discitis based on the findings at T8-T9. Recommend further characterization of the thoracolumbar spine with MRI. 3. Mild mediastinal lymphadenopathy is nonspecific but may be reactive. 4. Cholelithiasis. 5. Prostate enlargement. Wall thickening along the right side of the bladder is nonspecific and could be related to bladder outlet obstruction. 6. Scattered interstitial thickening in the right lower lobe may represent a combination of atelectasis and chronic changes. Small nodular densities in lungs, largest measuring 5 mm, are likely incidental findings. 7. 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 These results were called by telephone at the time of interpretation on 08/23/2018 at 5:38 pm to Dr. Isla Pence, who verbally acknowledged these results. Electronically Signed   By: Markus Daft M.D.   On: 08/23/2018 17:39    Pending Labs Unresulted Labs (From admission, onward)    Start     Ordered   08/23/18 2049  Novel Coronavirus,NAA,(SEND-OUT TO REF LAB - TAT 24-48  hrs); Hosp Order  (Asymptomatic Patients Labs)  Once,   STAT    Question:  Rule Out  Answer:  Yes   08/23/18 2048   08/23/18 2006  Culture, blood (routine x 2)  BLOOD CULTURE X 2,   STAT     08/23/18 2005          Vitals/Pain Today's Vitals   08/23/18 2015 08/23/18 2030 08/23/18 2106 08/23/18 2145  BP: (!) 179/125 (!) 175/89  (!) 173/89  Pulse: 97 90  86  Resp: 18 (!) 23  20  Temp:      SpO2: 90% 96%  100%  Weight:      Height:      PainSc:   0-No pain     Isolation Precautions No active isolations  Medications Medications  hydrALAZINE (APRESOLINE) injection 5 mg (5 mg Intravenous Given 08/23/18 1556)  iohexol (OMNIPAQUE) 350 MG/ML injection 100 mL (100 mLs Intravenous Contrast  Given 08/23/18 1617)  labetalol (NORMODYNE) injection 10 mg (10 mg Intravenous Given 08/23/18 1754)  gadobutrol (GADAVIST) 1 MMOL/ML injection 5 mL (5 mLs Intravenous Contrast Given 08/23/18 1922)    Mobility walks with person assist High fall risk   Focused Assessments Neuro Assessment Handoff:  Swallow screen pass? Yes    NIH Stroke Scale ( + Modified Stroke Scale Criteria)  Interval: Initial Level of Consciousness (1a.)   : Alert, keenly responsive LOC Questions (1b. )   +: Answers both questions correctly LOC Commands (1c. )   + : Performs both tasks correctly Best Gaze (2. )  +: Normal Visual (3. )  +: No visual loss Facial Palsy (4. )    : Normal symmetrical movements Motor Arm, Left (5a. )   +: No drift Motor Arm, Right (5b. )   +: No drift Motor Leg, Left (6a. )   +: No drift Motor Leg, Right (6b. )   +: No drift Limb Ataxia (7. ): Absent Sensory (8. )   +: Normal, no sensory loss Best Language (9. )   +: No aphasia Dysarthria (10. ): Normal Extinction/Inattention (11.)   +: No Abnormality Modified SS Total  +: 0 Complete NIHSS TOTAL: 0     Neuro Assessment: Exceptions to WDL Neuro Checks:   Initial (08/23/18 1147)  Last Documented NIHSS Modified Score: 0 (08/23/18 2008) Has TPA been given? No If patient is a Neuro Trauma and patient is going to OR before floor call report to [facility] nurse: [phone] or [phone]     R Recommendations: See Admitting Provider Note  Report given to:   Additional Notes:

## 2018-08-24 ENCOUNTER — Other Ambulatory Visit: Payer: Self-pay

## 2018-08-24 ENCOUNTER — Encounter (HOSPITAL_COMMUNITY): Payer: Self-pay | Admitting: General Practice

## 2018-08-24 DIAGNOSIS — K219 Gastro-esophageal reflux disease without esophagitis: Secondary | ICD-10-CM

## 2018-08-24 DIAGNOSIS — H9190 Unspecified hearing loss, unspecified ear: Secondary | ICD-10-CM

## 2018-08-24 DIAGNOSIS — I340 Nonrheumatic mitral (valve) insufficiency: Secondary | ICD-10-CM

## 2018-08-24 DIAGNOSIS — D649 Anemia, unspecified: Secondary | ICD-10-CM

## 2018-08-24 DIAGNOSIS — Z7901 Long term (current) use of anticoagulants: Secondary | ICD-10-CM

## 2018-08-24 DIAGNOSIS — I4811 Longstanding persistent atrial fibrillation: Secondary | ICD-10-CM

## 2018-08-24 DIAGNOSIS — I10 Essential (primary) hypertension: Secondary | ICD-10-CM

## 2018-08-24 DIAGNOSIS — M4624 Osteomyelitis of vertebra, thoracic region: Secondary | ICD-10-CM

## 2018-08-24 DIAGNOSIS — I4891 Unspecified atrial fibrillation: Secondary | ICD-10-CM

## 2018-08-24 LAB — BASIC METABOLIC PANEL
Anion gap: 9 (ref 5–15)
BUN: 18 mg/dL (ref 8–23)
CO2: 25 mmol/L (ref 22–32)
Calcium: 9.1 mg/dL (ref 8.9–10.3)
Chloride: 95 mmol/L — ABNORMAL LOW (ref 98–111)
Creatinine, Ser: 1.28 mg/dL — ABNORMAL HIGH (ref 0.61–1.24)
GFR calc Af Amer: 55 mL/min — ABNORMAL LOW (ref >60)
GFR calc non Af Amer: 48 mL/min — ABNORMAL LOW (ref >60)
Glucose, Bld: 141 mg/dL — ABNORMAL HIGH (ref 70–99)
Potassium: 3.8 mmol/L (ref 3.5–5.1)
Sodium: 129 mmol/L — ABNORMAL LOW (ref 135–145)

## 2018-08-24 LAB — CBC
HCT: 30.3 % — ABNORMAL LOW (ref 39.0–52.0)
Hemoglobin: 10.4 g/dL — ABNORMAL LOW (ref 13.0–17.0)
MCH: 31 pg (ref 26.0–34.0)
MCHC: 34.3 g/dL (ref 30.0–36.0)
MCV: 90.2 fL (ref 80.0–100.0)
Platelets: 204 10*3/uL (ref 150–400)
RBC: 3.36 MIL/uL — ABNORMAL LOW (ref 4.22–5.81)
RDW: 12.3 % (ref 11.5–15.5)
WBC: 7 10*3/uL (ref 4.0–10.5)
nRBC: 0 % (ref 0.0–0.2)

## 2018-08-24 LAB — HEPARIN LEVEL (UNFRACTIONATED)
Heparin Unfractionated: 0.24 IU/mL — ABNORMAL LOW (ref 0.30–0.70)
Heparin Unfractionated: 0.54 IU/mL (ref 0.30–0.70)

## 2018-08-24 MED ORDER — HYDRALAZINE HCL 20 MG/ML IJ SOLN
5.0000 mg | Freq: Four times a day (QID) | INTRAMUSCULAR | Status: DC | PRN
  Administered 2018-08-26 – 2018-08-27 (×2): 5 mg via INTRAVENOUS
  Filled 2018-08-24 (×4): qty 1

## 2018-08-24 NOTE — Progress Notes (Signed)
ANTICOAGULATION CONSULT NOTE  Pharmacy Consult for Heparin Indication: atrial fibrillation  No Known Allergies  Patient Measurements: Height: 5\' 7"  (170.2 cm) Weight: 143 lb (64.9 kg) IBW/kg (Calculated) : 66.1  Vital Signs: Temp: 98 F (36.7 C) (06/19 1527) Temp Source: Oral (06/19 1527) BP: 134/60 (06/19 1527) Pulse Rate: 63 (06/19 1527)  Labs: Recent Labs    08/23/18 1202 08/24/18 0908 08/24/18 1842  HGB 10.5* 10.4*  --   HCT 31.2* 30.3*  --   PLT 219 204  --   HEPARINUNFRC  --  0.24* 0.54  CREATININE 1.20 1.28*  --   TROPONINI <0.03  --   --     Estimated Creatinine Clearance: 32.4 mL/min (A) (by C-G formula based on SCr of 1.28 mg/dL (H)).     Assessment: 85 y.o. male admitted with spinal stenosis, h/o Afib and Pradaxa on hold. Pharmacy consulted to dose heparin. Plan for patient to IR for aspiration/cx.   Heparin level 0.54  Goal of Therapy:  Heparin level 0.3-0.7 units/ml Monitor platelets by anticoagulation protocol: Yes   Plan:  Continue heparin at  950 units/hr Monitor daily heparin level and CBC, s/sx bleeding F/u resume Pradaxa as appropriate  Thank you Anette Guarneri, PharmD [phone] 08/24/2018 7:14 PM

## 2018-08-24 NOTE — Progress Notes (Addendum)
PROGRESS NOTE   HERSHAL ERIKSSON  VZC:[id number]    DOB: [birth year]    DOA: 08/23/2018  PCP: Crist Infante, MD   I have briefly reviewed patients previous medical records in [facility].  Brief Narrative:  85 year old male with PMH of A. fib on Pradaxa, HTN, mitral valve insufficiency, GERD, hard of hearing, lives at home with his wife, presented with transient trouble speaking which occurred around 10:30 AM on day of admission 6/18 when he was going to the mailbox and also reported trouble dialing the phone.  In ED, stroke code activated.  MRI brain negative for acute stroke.  Chest x-ray showed incidental possible thoracic aneurysm followed by CTA chest and abdomen which was negative for aortic aneurysm or dissection but showed T8-9 osteomyelitis and discitis which was confirmed again on MRI of thoracic spine.  Neurosurgery consulted, patient declined surgery, IR consulted for biopsy cultures and ID for antimicrobial guidance.  Addendum: Biopsy has not been done yet.  Called and discussed with IR MD on call who will look into it and hopefully will get done tomorrow.   Assessment & Plan:   Principal Problem:   Discitis of thoracic region Active Problems:   Hypertension   Atrial fibrillation (HCC)   CKD (chronic kidney disease) stage 2, GFR 60-89 ml/min   Chronic anemia   Discitis   Transient speech difficulty/?  TIA  Reportedly had transient speech difficulty and inability to operate the telephone and no other stroke symptoms.  These resolved quickly and no recurrence.  Etiology not clear.  MRI brain without acute abnormalities.  Patient already on Pradaxa for A. fib.  Pradaxa currently on hold and on IV heparin for possible upcoming procedure.  Work-up of TIA not likely to change management and hence no further   work-up pursued especially in this pleasant 85 year old patient was already on anticoagulation.  I discussed with Neuro Hospitalist on call on 6/19 who  agrees.  T8-9 acute osteomyelitis/discitis with moderate to severe spinal stenosis  Mild intermittent low back pain but does not seem to be debilitating.  Imaging findings were almost incidental findings.  Neurosurgery consultation appreciated, patient declined surgery, they recommend IR consultation for aspiration/biopsy (input pending) and ID consult for antibiotics (consulted).  No antibiotics started prior to procedure.  I discussed with Dr. Cato Mulligan, appropriate to treat to avoid further complications i.e. worsening infection, vertebral fractures, worsening spinal stenosis etc.  Hypertensive urgency/essential hypertension  Presented to the ED with BP of 200/100.  Treated with labetalol in ED.  Continue current doxazosin 2 mg every other day and losartan 50 mg at bedtime.  Mildly uncontrolled.  May need to titrate above medications as needed.  Added PRN IV hydralazine.  Chronic, persistent A. fib  Controlled ventricular rate.  Not on rate control medications.  Home Pradaxa on hold pending procedures.  Continue IV heparin drip.  Follows annually with Dr. Peter Martinique, Cardiology, last seen in July 2019.  Moderate mitral insufficiency  Noted  Normocytic anemia  Possibly anemia of chronic disease.  Stable.  Hyponatremia  Appears to be chronic and stable.  Periodically follow BMP as outpatient.  Hard of hearing.  Nonsustained wide-complex tachycardia  Noted 39 beat nonsustained wide-complex tachycardia on telemetry 6/19 at 12:32 AM. ? NSVT Vs A. fib with aberrancy. Likely asymptomatic.  TTE ordered but may not alter his treatment much.  Small nodular lung densities  Incidental findings on CTA chest.  Noted.   Stage III chronic kidney disease  Baseline creatinine probably  in the 1.2-1.3 range, at baseline.  BPH   DVT prophylaxis: Currently on IV heparin infusion. Code Status: DNR. Family Communication: I discussed in detail with patient's daughter via  phone, updated care and answered questions. Disposition: Determined pending further evaluation and management.   Consultants:  Neurosurgery IR ID  Procedures:  None  Antimicrobials:  None   Subjective: Patient hard of hearing.  Denies recurrence of speech abnormalities.  Did state that yesterday when he developed speech abnormalities he also had difficulty operating his phone.  Does not complain of much low back pain at all.  Reports low back feeling slightly stiff in the mornings with intermittent mild pain without radiation.  No sphincter disturbances reported.  No fever or chills  ROS: As above, otherwise negative.  Objective:  Vitals:   08/23/18 2145 08/23/18 2325 08/24/18 0427 08/24/18 0746  BP: (!) 173/89 (!) 181/84 (!) 131/52 (!) 162/82  Pulse: 86 80 60 (!) 58  Resp: 20 18 16 16   Temp:  97.7 F (36.5 C) 97.8 F (36.6 C) 98.2 F (36.8 C)  TempSrc:  Oral Oral Oral  SpO2: 100% 99% 97% 98%  Weight:      Height:        Examination:  General exam: Pleasant elderly male, moderately built and nourished sitting up comfortably at edge of bed. Respiratory system: Clear to auscultation. Respiratory effort normal. Cardiovascular system: S1 & S2 heard, irregularly irregular. No JVD, murmurs, rubs, gallops or clicks. No pedal edema.  Telemetry personally reviewed: A. fib with controlled ventricular rate. Gastrointestinal system: Abdomen is nondistended, soft and nontender. No organomegaly or masses felt. Normal bowel sounds heard. Central nervous system: Alert and oriented. No focal neurological deficits.  Extremely hard of hearing. Extremities: Symmetric 5 x 5 power. Skin: No rashes, lesions or ulcers Psychiatry: Judgement and insight appear normal. Mood & affect appropriate.     Data Reviewed: I have personally reviewed following labs and imaging studies  CBC: Recent Labs  Lab 08/23/18 1202 08/24/18 0908  WBC 4.8 7.0  NEUTROABS 3.2  --   HGB 10.5* 10.4*  HCT  31.2* 30.3*  MCV 92.3 90.2  PLT 219 659   Basic Metabolic Panel: Recent Labs  Lab 08/23/18 1202 08/24/18 0908  NA 128* 129*  K 4.2 3.8  CL 95* 95*  CO2 25 25  GLUCOSE 98 141*  BUN 18 18  CREATININE 1.20 1.28*  CALCIUM 9.1 9.1   Liver Function Tests: Recent Labs  Lab 08/23/18 1202  AST 30  ALT 19  ALKPHOS 78  BILITOT 1.4*  PROT 6.6  ALBUMIN 3.7   Cardiac Enzymes: Recent Labs  Lab 08/23/18 1202  TROPONINI <0.03     Recent Results (from the past 240 hour(s))  Culture, blood (routine x 2)     Status: None (Preliminary result)   Collection Time: 08/23/18  8:06 PM   Specimen: BLOOD  Result Value Ref Range Status   Specimen Description BLOOD RIGHT WRIST  Final   Special Requests   Final    BOTTLES DRAWN AEROBIC AND ANAEROBIC Blood Culture results may not be optimal due to an excessive volume of blood received in culture bottles   Culture   Final    NO GROWTH < 12 HOURS Performed at [facility], [place] [address], [place], [place] [address]    Report Status PENDING  Incomplete  Culture, blood (routine x 2)     Status: None (Preliminary result)   Collection Time: 08/23/18  8:11 PM  Specimen: BLOOD  Result Value Ref Range Status   Specimen Description BLOOD LEFT ANTECUBITAL  Final   Special Requests   Final    BOTTLES DRAWN AEROBIC AND ANAEROBIC Blood Culture adequate volume   Culture   Final    NO GROWTH < 12 HOURS Performed at [facility], [address], [place], [place] [address]    Report Status PENDING  Incomplete         Radiology Studies: Dg Chest 2 View  Result Date: 08/23/2018 CLINICAL DATA:  Altered mental status. EXAM: CHEST - 2 VIEW COMPARISON:  Chest x-rays dated 06/08/2018 and 03/02/2009. FINDINGS: Heart size and mediastinal contours are stable. Coarse interstitial markings again noted throughout both lungs, increased compared to the previous studies, suggesting acute interstitial edema superimposed on chronic interstitial lung  disease. No confluent opacity to suggest consolidating pneumonia. No pleural effusion or pneumothorax seen. Retrocardiac density again noted, measuring approximately 8 cm width, not significantly changed compared to the previous study, compatible with aortic ectasia versus aneurysm, less likely hiatal hernia. IMPRESSION: 1. Coarse interstitial markings throughout both lungs, increased compared to the previous studies, suggesting acute interstitial edema superimposed on chronic interstitial lung disease. No evidence of consolidating pneumonia. 2. Persistent density overlying the lower mediastinum, measuring approximately 8 cm width, with differential considerations of aneurysmal dilatation of the thoracic aorta versus ectasia of the thoracic aorta, less likely hiatal hernia. Recommend further characterization with chest CT if 1 has not been recently performed. Electronically Signed   By: Franki Cabot M.D.   On: 08/23/2018 15:10   Ct Head Wo Contrast  Result Date: 08/23/2018 CLINICAL DATA:  Altered level of consciousness. EXAM: CT HEAD WITHOUT CONTRAST TECHNIQUE: Contiguous axial images were obtained from the base of the skull through the vertex without intravenous contrast. COMPARISON:  06/08/2018 FINDINGS: Brain: Moderate atrophy. Moderate to extensive chronic white matter changes bilaterally appear stable from the prior study. No acute infarct, hemorrhage, mass. No midline shift Vascular: Negative for hyperdense vessel Skull: Negative Sinuses/Orbits: Mucosal edema paranasal sinuses. Bilateral ocular surgery Other: None IMPRESSION: Atrophy and chronic ischemia.  No acute abnormality. Electronically Signed   By: Franchot Gallo M.D.   On: 08/23/2018 12:46   Mr Brain Wo Contrast  Result Date: 08/23/2018 CLINICAL DATA:  Altered level consciousness. Difficulty speaking. Unsteadiness. EXAM: MRI HEAD WITHOUT CONTRAST TECHNIQUE: Multiplanar, multiecho pulse sequences of the brain and surrounding structures were  obtained without intravenous contrast. COMPARISON:  Head CT 08/23/2018 and MRI 04/24/2012 FINDINGS: Brain: There is no evidence of acute infarct, intracranial hemorrhage, mass, midline shift, or extra-axial fluid collection. Confluent T2 hyperintensities in the cerebral white matter have progressed from the prior MRI and are particularly notable in the occipital and parietal lobes without cortical involvement, nonspecific but compatible with severe chronic small vessel ischemic disease. There is moderate cerebral atrophy. Vascular: Major intracranial vascular flow voids are preserved. Skull and upper cervical spine: Unremarkable bone marrow signal. Sinuses/Orbits: Bilateral cataract extraction. Minimal bilateral maxillary sinus mucosal thickening. Trace right mastoid effusion. Other: None. IMPRESSION: 1. No acute intracranial abnormality. 2. Severe chronic small vessel ischemic disease, progressed from 2014. Electronically Signed   By: Logan Bores M.D.   On: 08/23/2018 15:08   Mr Thoracic Spine W Wo Contrast  Result Date: 08/23/2018 CLINICAL DATA:  Initial evaluation for abnormal CT, disc destruction at T8-9. EXAM: MRI THORACIC SPINE WITHOUT CONTRAST TECHNIQUE: Multiplanar, multisequence MR imaging of the thoracic spine was performed. No intravenous contrast was administered. CONTRAST:  5 cc  of Gadavist. COMPARISON:  Prior CT from earlier the same day. FINDINGS: Alignment: Mild straightening of the normal mid thoracic kyphosis. No listhesis or malalignment. Vertebrae: Mild height loss at the superior endplates of T1 and T4 are chronic in appearance. Vertebral body height otherwise maintained without evidence for acute or subacute fracture. Underlying bone marrow signal intensity within normal limits. Few scattered benign hemangiomas noted. No worrisome osseous lesions. Severe loss of disc space height with endplate irregularity seen at the T8-9 level, corresponding with abnormality seen on prior CT. Associated  fluid signal intensity within the residual T8-9 disc with associated endplate irregularity. Abnormal marrow edema and enhancement within the adjacent T8 and T9 vertebral bodies. Findings compatible with acute osteomyelitis discitis. Edema and enhancement extends into the posterior elements bilaterally, with involvement of the T8-9 facets, which could reflect associated septic arthropathy. Associated mild epidural enhancement at this level without frank epidural abscess or collection. Paraspinous phlegmon and edema seen adjacent to the T8-9 interspace without discrete paraspinous abscess. Resultant moderate to severe spinal stenosis at this level. No other evidence for acute infection within the thoracic spine. No other abnormal marrow edema or enhancement. Cord: Signal intensity within the thoracic spinal cord is within normal limits. Specifically, no cord signal abnormality seen at the level of T8-9. Paraspinal soft tissues: Paraspinous edema and phlegmon without discrete abscess or collection adjacent to the T8-9 interspace. Paraspinous soft tissues otherwise within normal limits. Trace layering bilateral pleural effusions with associated mild atelectasis. Atherosclerotic change noted within the visualized aorta. 5.7 cm exophytic simple right renal cyst partially visualized. Visualized visceral structures otherwise unremarkable. Disc levels: T1-2: Minimal disc bulge. Mild right greater than left facet hypertrophy. No significant spinal stenosis. Mild to moderate right with mild left foraminal narrowing. T2-3: Minimal disc bulge with bilateral facet hypertrophy. No significant stenosis. T3-4: Minimal disc bulge with bilateral facet hypertrophy, greater on the right. No significant spinal stenosis. Mild right foraminal narrowing. T4-5: Negative interspace. Mild facet hypertrophy. No significant spinal stenosis. Mild right foraminal narrowing. T5-6: Negative interspace. Bilateral facet hypertrophy with associated  small joint effusions. No spinal stenosis. Mild bilateral foraminal narrowing. T6-7: Negative interspace. Mild facet hypertrophy. No significant stenosis. T7-8: Shallow central disc protrusion mildly flattens the ventral thecal sac. Mild facet hypertrophy. No significant spinal stenosis. Foramina remain patent. T8-9: Changes consistent with osteomyelitis discitis with partial obliteration of the T8-9 interspace. Associated bilateral facet arthrosis with ligamentum flavum hypertrophy. Resultant moderate to severe spinal stenosis with mild flattening of the thoracic spinal cord. Thecal sac measures 5 mm in AP diameter at its most narrow point. No associated cord signal changes. Moderate to severe bilateral foraminal narrowing. T9-10: Negative interspace. Bilateral facet hypertrophy. No significant stenosis. T10-11: Negative interspace. Bilateral facet hypertrophy. No significant stenosis. T11-12: Mild annular disc bulge with facet hypertrophy. Mild spinal stenosis without cord impingement. Foramina remain patent. T12-L1:  Negative. IMPRESSION: 1. Findings consistent with acute osteomyelitis discitis at T8-9 as detailed above. Extension of edema into the posterior elements of T8-9 in about the bilateral T8-9 facets, which could reflect associated septic arthritis. Mild localized epidural enhancement without frank epidural abscess or collection. Resultant moderate to severe spinal stenosis at this level without cord signal changes. 2. Associated paraspinous phlegmon and enhancement adjacent to the T8-9 interspace without discrete paraspinous abscess or drainable fluid collection. 3. Mild multilevel degenerative spondylolysis elsewhere within the thoracic spine without significant spinal stenosis. Mild to moderate bilateral foraminal narrowing within the upper thoracic spine as detailed above. Electronically Signed  By: Jeannine Boga M.D.   On: 08/23/2018 19:59   Ct Angio Chest/abd/pel For Dissection W And/or  Wo Contrast  Result Date: 08/23/2018 CLINICAL DATA:  85 year old with chest and back pain. Evaluate for aortic dissection. EXAM: CT ANGIOGRAPHY CHEST, ABDOMEN AND PELVIS TECHNIQUE: Multidetector CT imaging through the chest, abdomen and pelvis was performed using the standard protocol during bolus administration of intravenous contrast. Multiplanar reconstructed images and MIPs were obtained and reviewed to evaluate the vascular anatomy. CONTRAST:  158mL OMNIPAQUE IOHEXOL 350 MG/ML SOLN COMPARISON:  Neck CTA 06/08/2018 FINDINGS: CTA CHEST FINDINGS Cardiovascular: Normal caliber of the thoracic aorta with dissection. Atherosclerotic calcifications throughout the thoracic aorta. Great vessels are patent without dissection. Pulmonary arteries are patent without filling defects or pulmonary emboli. No significant pericardial fluid. There are coronary artery calcifications. Mediastinum/Nodes: Subcarinal tissue is prominent measuring up to 1.5 cm on sequence 6, image 46. Mildly prominent lymph nodes in the AP window. AP window lymph node measuring 1.1 cm on sequence 6, image 36. Mildly prominent right hilar soft tissue. No axillary lymph node enlargement. Lungs/Pleura: Trachea and mainstem bronchi are patent. No large pleural effusions. No large areas of consolidation but there are prominent interstitial densities in the right lower lobe. Findings could be a combination of scarring and atelectasis. Mild thickening along the right major fissure on sequence 8, image 51 that is nonspecific. At least 2 small nodules in the right lower lobe, measuring 5 mm on sequence 6, image 44 and image 57. Pulmonary nodules are likely incidental findings. No suspicious pulmonary nodules. Musculoskeletal: There is marked abnormality at the T8-T9 disc space. There appears to be destruction of the disc space with destruction of the T8 inferior endplate and the T9 superior endplate. In addition, there is extensive osteophytosis or  heterotopic bone formation in this region. Increased soft tissue at the level of T8-T9. Findings are concerning for underlying infectious or inflammatory process. Question mild superior compression deformity at T4. Review of the MIP images confirms the above findings. CTA ABDOMEN AND PELVIS FINDINGS VASCULAR Aorta: Atherosclerotic calcifications in the abdominal aorta without aneurysm or dissection. Celiac: Patent without evidence of aneurysm, dissection, vasculitis or significant stenosis. 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 SMA is patent. Renals: Both renal arteries are patent without evidence of aneurysm, dissection, vasculitis, fibromuscular dysplasia or significant stenosis. IMA: IMA is patent. Inflow: Patent without evidence of aneurysm, dissection, vasculitis or significant stenosis. Veins: No obvious venous abnormality within the limitations of this arterial phase study. Incidentally, there is a retroaortic left renal vein. Review of the MIP images confirms the above findings. NON-VASCULAR Hepatobiliary: High-density material in the gallbladder is compatible with stones. No evidence for gallbladder inflammation. No acute abnormality to the liver. Pancreas: Artifact in the region of the pancreas but no gross abnormality. Spleen: Motion artifact in this region but no gross abnormality. Adrenals/Urinary Tract: Normal appearance of the adrenal glands. Large low-density structure along the right kidney upper pole is most compatible with a cyst measuring up to 6.6 cm. Negative for hydronephrosis. No suspicious renal lesions. Urinary bladder is displaced superiorly with wall thickening along the right side of the bladder. Bladder wall thickening is noted on sequence 6, image 159. Stomach/Bowel: Normal appendix. Normal appearance of the stomach. No evidence for bowel dilatation. No focal bowel inflammation. Lymphatic: No significant lymph node enlargement in the abdomen or  pelvis. Reproductive: Prostate is markedly enlarged measuring roughly 6.3 x 5.9 x 8.8 cm. Other: Negative  for free fluid. Negative for free air. Evidence for defect along the lateral left abdominal wall on sequence 6, image 116 that may be related to a fat containing hernia. Musculoskeletal: Both hips are located. Complete disc space loss at L2-L3. 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 Review of the MIP images confirms the above findings. IMPRESSION: 1. Negative for an aortic aneurysm or dissection. Aortic Atherosclerosis (ICD10-I70.0). 2. Destruction of the disc space at T8-T9 is suggestive for osteomyelitis and discitis at this level. Complete disc space loss at L2-L3 could represent degenerative disc disease but cannot exclude discitis based on the findings at T8-T9. Recommend further characterization of the thoracolumbar spine with MRI. 3. Mild mediastinal lymphadenopathy is nonspecific but may be reactive. 4. Cholelithiasis. 5. Prostate enlargement. Wall thickening along the right side of the bladder is nonspecific and could be related to bladder outlet obstruction. 6. Scattered interstitial thickening in the right lower lobe may represent a combination of atelectasis and chronic changes. Small nodular densities in lungs, largest measuring 5 mm, are likely incidental findings. 7. 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 These results were called by telephone at the time of interpretation on 08/23/2018 at 5:38 pm to Dr. Isla Pence, who verbally acknowledged these results. Electronically Signed   By: Markus Daft M.D.   On: 08/23/2018 17:39        Scheduled Meds:  doxazosin  2 mg Oral QODAY   latanoprost  1 drop Both Eyes QHS   losartan  50 mg Oral QHS   pantoprazole  40 mg Oral q morning - 10a   polyvinyl alcohol  1 drop Both Eyes QHS   vitamin B-12  500 mcg Oral Daily   Continuous Infusions:  heparin 800 Units/hr (08/24/18 0131)     LOS: 1 day     Vernell Leep,  MD, FACP, [facility]. [facility]  To contact the attending provider between 7A-7P or the covering provider during after hours 7P-7A, please log into the web site [email or web address] and access using universal [place] password for that web site. If you do not have the password, please call the hospital operator.  08/24/2018, 10:52 AM

## 2018-08-24 NOTE — Progress Notes (Signed)
ANTICOAGULATION CONSULT NOTE  Pharmacy Consult for Heparin Indication: atrial fibrillation  No Known Allergies  Patient Measurements: Height: 5\' 7"  (170.2 cm) Weight: 143 lb (64.9 kg) IBW/kg (Calculated) : 66.1  Vital Signs: Temp: 98.2 F (36.8 C) (06/19 0746) Temp Source: Oral (06/19 0746) BP: 162/82 (06/19 0746) Pulse Rate: 58 (06/19 0746)  Labs: Recent Labs    08/23/18 1202 08/24/18 0908  HGB 10.5* 10.4*  HCT 31.2* 30.3*  PLT 219 204  CREATININE 1.20 1.28*  TROPONINI <0.03  --     Estimated Creatinine Clearance: 32.4 mL/min (A) (by C-G formula based on SCr of 1.28 mg/dL (H)).   Medical History: Past Medical History:  Diagnosis Date  . AF (atrial fibrillation) ([place])   . Asthma   . Detached retina   . Diverticulosis   . GERD (gastroesophageal reflux disease)   . [name] syndrome   . Hypertension   . Mitral insufficiency   . Renal calculi     Medications:  Medications Prior to Admission  Medication Sig Dispense Refill Last Dose  . losartan (COZAAR) 100 MG tablet Take 50 mg by mouth at bedtime.    08/23/2018 at 0400  . aspirin 81 MG chewable tablet Chew 1 tablet (81 mg total) by mouth daily. 30 tablet 0   . Cyanocobalamin (VITAMIN B12) 500 MCG TABS Take 500 mcg by mouth daily.     . dabigatran (PRADAXA) 150 MG CAPS Take 1 capsule (150 mg total) by mouth every 12 (twelve) hours. 60 capsule 5   . doxazosin (CARDURA) 4 MG tablet Take 2 mg by mouth every other day.      . Lactobacillus (PROBIOTIC ACIDOPHILUS PO) Take 1 capsule by mouth daily.      Marland Kitchen latanoprost (XALATAN) 0.005 % ophthalmic solution Place 1 drop into both eyes at bedtime.   11   . Multiple Vitamin (MULTIVITAMIN WITH MINERALS) TABS Take 1 tablet by mouth daily before supper.      . Multiple Vitamins-Minerals (ICAPS AREDS FORMULA PO) Take 1 capsule by mouth 2 (two) times daily.      . pantoprazole (PROTONIX) 40 MG tablet Take 40 mg by mouth every morning.      [name] Glycol-Propyl Glycol  (SYSTANE) 0.4-0.3 % SOLN Place 1 drop into both eyes at bedtime.      . vitamin C (ASCORBIC ACID) 500 MG tablet Take 500 mg by mouth daily before supper.        Assessment: 85 y.o. male admitted with spinal stenosis, h/o Afib and Pradaxa on hold. Pharmacy consulted to dose heparin. Plan for patient to IR for aspiration/cx. Initial heparin level subtherapeutic at 0.24. CBC wnl. No active bleed issues documented. Last Pradaxa dose 6/18 AM.  Goal of Therapy:  Heparin level 0.3-0.7 units/ml Monitor platelets by anticoagulation protocol: Yes   Plan:  Increase heparin to 950 units/hr Monitor daily heparin level and CBC, s/sx bleeding F/u resume Pradaxa as appropriate  Elicia Lamp, PharmD, BCPS Please check AMION for all [place] contact numbers Clinical Pharmacist 08/24/2018 10:37 AM

## 2018-08-24 NOTE — Progress Notes (Signed)
  NEUROSURGERY PROGRESS NOTE   No issues overnight.  Denies back discomfort, radicular pains, weakness in extremities  EXAM:  BP (!) 162/82 (BP Location: Right Arm)   Pulse (!) 58   Temp 98.2 F (36.8 C) (Oral)   Resp 16   Ht 5\' 7"  (1.702 m)   Wt 64.9 kg   SpO2 98%   BMI 22.40 kg/m   Awake, alert, oriented  Speech fluent, appropriate  CN grossly intact  5/5 BUE/BLE   PLAN Neurologically stable. Patient remains adamant against surgery No change in plan from NS perspective No need for NS f/u. Please call for any concerns.

## 2018-08-24 NOTE — Consult Note (Signed)
[facility]         Reason for Consult: spinal osteo    Referring Physician: [email or web address]  Principal Problem:   Discitis of thoracic region Active Problems:   Hypertension   Atrial fibrillation (HCC)   CKD (chronic kidney disease) stage 2, GFR 60-89 ml/min   Chronic anemia   Discitis    HPI: Cody Mendoza is a 85 y.o. male with history of  A. fib on Pradaxa, HTN, mitral valve insufficiency, GERD, hard of hearing, lives at home with his wife, who was admitted for difficulty with word finding and confusion, concerning for stroke like symptoms. He was evaluated in ED, MRI excluded stroke however had other studies that included ruling out for aortic aneurysm and found to have new T8-T9 osteomyelitis which was asymptomatic. The patient denies any recent infections of pneumonia, urinary tract or gi tract or bloodstream infection. He was seen by neurosurgery who recommended medical management due to concern for worsening process despite patient is asymptomatic. The patient is also declined surgery Here he remains afebrile. Awaiting bone biopsy  Past Medical History:  Diagnosis Date   AF (atrial fibrillation) (HCC)    Asthma    Detached retina    Discitis of thoracic region 08/2018   Diverticulosis    GERD (gastroesophageal reflux disease)    Rosanna Randy syndrome    Hypertension    Lactose intolerance    Mitral insufficiency    Renal calculi     Allergies: No Known Allergies   MEDICATIONS:  doxazosin  2 mg Oral QODAY   latanoprost  1 drop Both Eyes QHS   losartan  50 mg Oral QHS   pantoprazole  40 mg Oral q morning - 10a   polyvinyl alcohol  1 drop Both Eyes QHS   vitamin B-12  500 mcg Oral Daily    Social History   Tobacco Use   Smoking status: Never Smoker   Smokeless tobacco: Never Used  Substance Use Topics   Alcohol use: Yes    Alcohol/week: 0.0 standard drinks    Comment: rarely   Drug use: Never    Family  History  Problem Relation Age of Onset   Hypertension Mother      Review of Systems  Constitutional: Negative for fever, chills, diaphoresis, activity change, appetite change, fatigue and unexpected weight change.  HENT: Negative for congestion, sore throat, rhinorrhea, sneezing, trouble swallowing and sinus pressure.  Eyes: Negative for photophobia and visual disturbance.  Respiratory: Negative for cough, chest tightness, shortness of breath, wheezing and stridor.  Cardiovascular: Negative for chest pain, palpitations and leg swelling.  Gastrointestinal: Negative for nausea, vomiting, abdominal pain, diarrhea, constipation, blood in stool, abdominal distention and anal bleeding.  Genitourinary: Negative for dysuria, hematuria, flank pain and difficulty urinating.  Musculoskeletal: Negative for myalgias, back pain, joint swelling, arthralgias and gait problem.  Skin: Negative for color change, pallor, rash and wound.  Neurological: Negative for dizziness, tremors, weakness and light-headedness.  Hematological: Negative for adenopathy. Does not bruise/bleed easily.  Psychiatric/Behavioral: Negative for behavioral problems, confusion, sleep disturbance, dysphoric mood, decreased concentration and agitation.     OBJECTIVE: Temp:  [97.7 F (36.5 C)-98.3 F (36.8 C)] 98 F (36.7 C) (06/19 1527) Pulse Rate:  [58-98] 63 (06/19 1527) Resp:  [14-29] 14 (06/19 1527) BP: (131-208)/(52-125) 134/60 (06/19 1527) SpO2:  [90 %-100 %] 97 % (06/19 1527) Physical Exam  Constitutional: He is oriented to person, place, and time. He appears stated age, slightly frail  and well-nourished. No distress.  HENT:  Mouth/Throat: Oropharynx is clear and moist. No oropharyngeal exudate.  Cardiovascular: Normal rate, regular rhythm and normal heart sounds. Exam reveals no gallop and no friction rub.  No murmur heard.  Pulmonary/Chest: Effort normal and breath sounds normal. No respiratory distress. He has no  wheezes.  Abdominal: Soft. Bowel sounds are normal. He exhibits no distension. There is no tenderness.  Lymphadenopathy:  He has no cervical adenopathy.  Neurological: He is alert and oriented to person, place, and time.  Skin: Skin is warm and dry. No rash noted. No erythema.  Psychiatric: He has a normal mood and affect. His behavior is normal.    LABS: Results for orders placed or performed during the hospital encounter of 08/23/18 (from the past 48 hour(s))  Urinalysis, Routine w reflex microscopic     Status: None   Collection Time: 08/23/18 11:51 AM  Result Value Ref Range   Color, Urine YELLOW YELLOW   APPearance CLEAR CLEAR   Specific Gravity, Urine 1.012 1.005 - 1.030   pH 7.0 5.0 - 8.0   Glucose, UA NEGATIVE NEGATIVE mg/dL   Hgb urine dipstick NEGATIVE NEGATIVE   Bilirubin Urine NEGATIVE NEGATIVE   Ketones, ur NEGATIVE NEGATIVE mg/dL   Protein, ur NEGATIVE NEGATIVE mg/dL   Nitrite NEGATIVE NEGATIVE   Leukocytes,Ua NEGATIVE NEGATIVE    Comment: Performed at [facility] [address], [place], [place] [address]  CBC with Differential/Platelet     Status: Abnormal   Collection Time: 08/23/18 12:02 PM  Result Value Ref Range   WBC 4.8 4.0 - 10.5 K/uL   RBC 3.38 (L) 4.22 - 5.81 MIL/uL   Hemoglobin 10.5 (L) 13.0 - 17.0 g/dL   HCT 31.2 (L) 39.0 - 52.0 %   MCV 92.3 80.0 - 100.0 fL   MCH 31.1 26.0 - 34.0 pg   MCHC 33.7 30.0 - 36.0 g/dL   RDW 12.2 11.5 - 15.5 %   Platelets 219 150 - 400 K/uL   nRBC 0.0 0.0 - 0.2 %   Neutrophils Relative % 66 %   Neutro Abs 3.2 1.7 - 7.7 K/uL   Lymphocytes Relative 16 %   Lymphs Abs 0.8 0.7 - 4.0 K/uL   Monocytes Relative 14 %   Monocytes Absolute 0.7 0.1 - 1.0 K/uL   Eosinophils Relative 3 %   Eosinophils Absolute 0.1 0.0 - 0.5 K/uL   Basophils Relative 1 %   Basophils Absolute 0.1 0.0 - 0.1 K/uL   Immature Granulocytes 0 %   Abs Immature Granulocytes 0.02 0.00 - 0.07 K/uL    Comment: Performed at [facility] [address] [place], [place] [address]  Comprehensive metabolic panel     Status: Abnormal   Collection Time: 08/23/18 12:02 PM  Result Value Ref Range   Sodium 128 (L) 135 - 145 mmol/L   Potassium 4.2 3.5 - 5.1 mmol/L   Chloride 95 (L) 98 - 111 mmol/L   CO2 25 22 - 32 mmol/L   Glucose, Bld 98 70 - 99 mg/dL   BUN 18 8 - 23 mg/dL   Creatinine, Ser 1.20 0.61 - 1.24 mg/dL   Calcium 9.1 8.9 - 10.3 mg/dL   Total Protein 6.6 6.5 - 8.1 g/dL   Albumin 3.7 3.5 - 5.0 g/dL   AST 30 15 - 41 U/L   ALT 19 0 - 44 U/L   Alkaline Phosphatase 78 38 - 126 U/L   Total Bilirubin 1.4 (H)  0.3 - 1.2 mg/dL   GFR calc non Af Amer 51 (L) >60 mL/min   GFR calc Af Amer 60 (L) >60 mL/min   Anion gap 8 5 - 15    Comment: Performed at [facility] [address] [place], [place] [address]  Troponin I - ONCE - STAT     Status: None   Collection Time: 08/23/18 12:02 PM  Result Value Ref Range   Troponin I <0.03 <0.03 ng/mL    Comment: Performed at [facility], [place] [address] [place], [place] [address]  Culture, blood (routine x 2)     Status: None (Preliminary result)   Collection Time: 08/23/18  8:06 PM   Specimen: BLOOD  Result Value Ref Range   Specimen Description BLOOD RIGHT WRIST    Special Requests      BOTTLES DRAWN AEROBIC AND ANAEROBIC Blood Culture results may not be optimal due to an excessive volume of blood received in culture bottles   Culture      NO GROWTH < 12 HOURS Performed at [facility] [address] [place], [place] [address]    Report Status PENDING   Culture, blood (routine x 2)     Status: None (Preliminary result)   Collection Time: 08/23/18  8:11 PM   Specimen: BLOOD  Result Value Ref Range   Specimen Description BLOOD LEFT ANTECUBITAL    Special Requests      BOTTLES DRAWN AEROBIC AND ANAEROBIC Blood Culture adequate volume   Culture      NO GROWTH < 12 HOURS Performed at [facility], [place] [address], [place], [place] [address]    Report  Status PENDING   Heparin level (unfractionated)     Status: Abnormal   Collection Time: 08/24/18  9:08 AM  Result Value Ref Range   Heparin Unfractionated 0.24 (L) 0.30 - 0.70 IU/mL    Comment: (NOTE) If heparin results are below expected values, and patient dosage has  been confirmed, suggest follow up testing of antithrombin III levels. Performed at [facility], [place] [address] [place], [place] [address]   Basic metabolic panel     Status: Abnormal   Collection Time: 08/24/18  9:08 AM  Result Value Ref Range   Sodium 129 (L) 135 - 145 mmol/L   Potassium 3.8 3.5 - 5.1 mmol/L   Chloride 95 (L) 98 - 111 mmol/L   CO2 25 22 - 32 mmol/L   Glucose, Bld 141 (H) 70 - 99 mg/dL   BUN 18 8 - 23 mg/dL   Creatinine, Ser 1.28 (H) 0.61 - 1.24 mg/dL   Calcium 9.1 8.9 - 10.3 mg/dL   GFR calc non Af Amer 48 (L) >60 mL/min   GFR calc Af Amer 55 (L) >60 mL/min   Anion gap 9 5 - 15    Comment: Performed at [facility] [address] [place],  [address]  CBC     Status: Abnormal   Collection Time: 08/24/18  9:08 AM  Result Value Ref Range   WBC 7.0 4.0 - 10.5 K/uL   RBC 3.36 (L) 4.22 - 5.81 MIL/uL   Hemoglobin 10.4 (L) 13.0 - 17.0 g/dL   HCT 30.3 (L) 39.0 - 52.0 %   MCV 90.2 80.0 - 100.0 fL   MCH 31.0 26.0 - 34.0 pg   MCHC 34.3 30.0 - 36.0 g/dL   RDW 12.3 11.5 - 15.5 %   Platelets 204 150 -  400 K/uL   nRBC 0.0 0.0 - 0.2 %    Comment: Performed at [facility], [place] [address] [place], [place] [address]    MICRO: 6/18 blood cx pending IMAGING: Dg Chest 2 View  Result Date: 08/23/2018 CLINICAL DATA:  Altered mental status. EXAM: CHEST - 2 VIEW COMPARISON:  Chest x-rays dated 06/08/2018 and 03/02/2009. FINDINGS: Heart size and mediastinal contours are stable. Coarse interstitial markings again noted throughout both lungs, increased compared to the previous studies, suggesting acute interstitial edema superimposed on chronic interstitial lung disease. No confluent  opacity to suggest consolidating pneumonia. No pleural effusion or pneumothorax seen. Retrocardiac density again noted, measuring approximately 8 cm width, not significantly changed compared to the previous study, compatible with aortic ectasia versus aneurysm, less likely hiatal hernia. IMPRESSION: 1. Coarse interstitial markings throughout both lungs, increased compared to the previous studies, suggesting acute interstitial edema superimposed on chronic interstitial lung disease. No evidence of consolidating pneumonia. 2. Persistent density overlying the lower mediastinum, measuring approximately 8 cm width, with differential considerations of aneurysmal dilatation of the thoracic aorta versus ectasia of the thoracic aorta, less likely hiatal hernia. Recommend further characterization with chest CT if 1 has not been recently performed. Electronically Signed   By: Franki Cabot M.D.   On: 08/23/2018 15:10   Ct Head Wo Contrast  Result Date: 08/23/2018 CLINICAL DATA:  Altered level of consciousness. EXAM: CT HEAD WITHOUT CONTRAST TECHNIQUE: Contiguous axial images were obtained from the base of the skull through the vertex without intravenous contrast. COMPARISON:  06/08/2018 FINDINGS: Brain: Moderate atrophy. Moderate to extensive chronic white matter changes bilaterally appear stable from the prior study. No acute infarct, hemorrhage, mass. No midline shift Vascular: Negative for hyperdense vessel Skull: Negative Sinuses/Orbits: Mucosal edema paranasal sinuses. Bilateral ocular surgery Other: None IMPRESSION: Atrophy and chronic ischemia.  No acute abnormality. Electronically Signed   By: Franchot Gallo M.D.   On: 08/23/2018 12:46   Mr Brain Wo Contrast  Result Date: 08/23/2018 CLINICAL DATA:  Altered level consciousness. Difficulty speaking. Unsteadiness. EXAM: MRI HEAD WITHOUT CONTRAST TECHNIQUE: Multiplanar, multiecho pulse sequences of the brain and surrounding structures were obtained without  intravenous contrast. COMPARISON:  Head CT 08/23/2018 and MRI 04/24/2012 FINDINGS: Brain: There is no evidence of acute infarct, intracranial hemorrhage, mass, midline shift, or extra-axial fluid collection. Confluent T2 hyperintensities in the cerebral white matter have progressed from the prior MRI and are particularly notable in the occipital and parietal lobes without cortical involvement, nonspecific but compatible with severe chronic small vessel ischemic disease. There is moderate cerebral atrophy. Vascular: Major intracranial vascular flow voids are preserved. Skull and upper cervical spine: Unremarkable bone marrow signal. Sinuses/Orbits: Bilateral cataract extraction. Minimal bilateral maxillary sinus mucosal thickening. Trace right mastoid effusion. Other: None. IMPRESSION: 1. No acute intracranial abnormality. 2. Severe chronic small vessel ischemic disease, progressed from 2014. Electronically Signed   By: Logan Bores M.D.   On: 08/23/2018 15:08   Mr Thoracic Spine W Wo Contrast  Result Date: 08/23/2018 CLINICAL DATA:  Initial evaluation for abnormal CT, disc destruction at T8-9. EXAM: MRI THORACIC SPINE WITHOUT CONTRAST TECHNIQUE: Multiplanar, multisequence MR imaging of the thoracic spine was performed. No intravenous contrast was administered. CONTRAST:  5 cc of Gadavist. COMPARISON:  Prior CT from earlier the same day. FINDINGS: Alignment: Mild straightening of the normal mid thoracic kyphosis. No listhesis or malalignment. Vertebrae: Mild height loss at the superior endplates of T1 and T4 are chronic in appearance. Vertebral body height otherwise  maintained without evidence for acute or subacute fracture. Underlying bone marrow signal intensity within normal limits. Few scattered benign hemangiomas noted. No worrisome osseous lesions. Severe loss of disc space height with endplate irregularity seen at the T8-9 level, corresponding with abnormality seen on prior CT. Associated fluid signal  intensity within the residual T8-9 disc with associated endplate irregularity. Abnormal marrow edema and enhancement within the adjacent T8 and T9 vertebral bodies. Findings compatible with acute osteomyelitis discitis. Edema and enhancement extends into the posterior elements bilaterally, with involvement of the T8-9 facets, which could reflect associated septic arthropathy. Associated mild epidural enhancement at this level without frank epidural abscess or collection. Paraspinous phlegmon and edema seen adjacent to the T8-9 interspace without discrete paraspinous abscess. Resultant moderate to severe spinal stenosis at this level. No other evidence for acute infection within the thoracic spine. No other abnormal marrow edema or enhancement. Cord: Signal intensity within the thoracic spinal cord is within normal limits. Specifically, no cord signal abnormality seen at the level of T8-9. Paraspinal soft tissues: Paraspinous edema and phlegmon without discrete abscess or collection adjacent to the T8-9 interspace. Paraspinous soft tissues otherwise within normal limits. Trace layering bilateral pleural effusions with associated mild atelectasis. Atherosclerotic change noted within the visualized aorta. 5.7 cm exophytic simple right renal cyst partially visualized. Visualized visceral structures otherwise unremarkable. Disc levels: T1-2: Minimal disc bulge. Mild right greater than left facet hypertrophy. No significant spinal stenosis. Mild to moderate right with mild left foraminal narrowing. T2-3: Minimal disc bulge with bilateral facet hypertrophy. No significant stenosis. T3-4: Minimal disc bulge with bilateral facet hypertrophy, greater on the right. No significant spinal stenosis. Mild right foraminal narrowing. T4-5: Negative interspace. Mild facet hypertrophy. No significant spinal stenosis. Mild right foraminal narrowing. T5-6: Negative interspace. Bilateral facet hypertrophy with associated small joint  effusions. No spinal stenosis. Mild bilateral foraminal narrowing. T6-7: Negative interspace. Mild facet hypertrophy. No significant stenosis. T7-8: Shallow central disc protrusion mildly flattens the ventral thecal sac. Mild facet hypertrophy. No significant spinal stenosis. Foramina remain patent. T8-9: Changes consistent with osteomyelitis discitis with partial obliteration of the T8-9 interspace. Associated bilateral facet arthrosis with ligamentum flavum hypertrophy. Resultant moderate to severe spinal stenosis with mild flattening of the thoracic spinal cord. Thecal sac measures 5 mm in AP diameter at its most narrow point. No associated cord signal changes. Moderate to severe bilateral foraminal narrowing. T9-10: Negative interspace. Bilateral facet hypertrophy. No significant stenosis. T10-11: Negative interspace. Bilateral facet hypertrophy. No significant stenosis. T11-12: Mild annular disc bulge with facet hypertrophy. Mild spinal stenosis without cord impingement. Foramina remain patent. T12-L1:  Negative. IMPRESSION: 1. Findings consistent with acute osteomyelitis discitis at T8-9 as detailed above. Extension of edema into the posterior elements of T8-9 in about the bilateral T8-9 facets, which could reflect associated septic arthritis. Mild localized epidural enhancement without frank epidural abscess or collection. Resultant moderate to severe spinal stenosis at this level without cord signal changes. 2. Associated paraspinous phlegmon and enhancement adjacent to the T8-9 interspace without discrete paraspinous abscess or drainable fluid collection. 3. Mild multilevel degenerative spondylolysis elsewhere within the thoracic spine without significant spinal stenosis. Mild to moderate bilateral foraminal narrowing within the upper thoracic spine as detailed above. Electronically Signed   By: Jeannine Boga M.D.   On: 08/23/2018 19:59   Ct Angio Chest/abd/pel For Dissection W And/or Wo  Contrast  Result Date: 08/23/2018 CLINICAL DATA:  85 year old with chest and back pain. Evaluate for aortic dissection. EXAM: CT ANGIOGRAPHY CHEST, ABDOMEN AND  PELVIS TECHNIQUE: Multidetector CT imaging through the chest, abdomen and pelvis was performed using the standard protocol during bolus administration of intravenous contrast. Multiplanar reconstructed images and MIPs were obtained and reviewed to evaluate the vascular anatomy. CONTRAST:  176mL OMNIPAQUE IOHEXOL 350 MG/ML SOLN COMPARISON:  Neck CTA 06/08/2018 FINDINGS: CTA CHEST FINDINGS Cardiovascular: Normal caliber of the thoracic aorta with dissection. Atherosclerotic calcifications throughout the thoracic aorta. Great vessels are patent without dissection. Pulmonary arteries are patent without filling defects or pulmonary emboli. No significant pericardial fluid. There are coronary artery calcifications. Mediastinum/Nodes: Subcarinal tissue is prominent measuring up to 1.5 cm on sequence 6, image 46. Mildly prominent lymph nodes in the AP window. AP window lymph node measuring 1.1 cm on sequence 6, image 36. Mildly prominent right hilar soft tissue. No axillary lymph node enlargement. Lungs/Pleura: Trachea and mainstem bronchi are patent. No large pleural effusions. No large areas of consolidation but there are prominent interstitial densities in the right lower lobe. Findings could be a combination of scarring and atelectasis. Mild thickening along the right major fissure on sequence 8, image 51 that is nonspecific. At least 2 small nodules in the right lower lobe, measuring 5 mm on sequence 6, image 44 and image 57. Pulmonary nodules are likely incidental findings. No suspicious pulmonary nodules. Musculoskeletal: There is marked abnormality at the T8-T9 disc space. There appears to be destruction of the disc space with destruction of the T8 inferior endplate and the T9 superior endplate. In addition, there is extensive osteophytosis or heterotopic  bone formation in this region. Increased soft tissue at the level of T8-T9. Findings are concerning for underlying infectious or inflammatory process. Question mild superior compression deformity at T4. Review of the MIP images confirms the above findings. CTA ABDOMEN AND PELVIS FINDINGS VASCULAR Aorta: Atherosclerotic calcifications in the abdominal aorta without aneurysm or dissection. Celiac: Patent without evidence of aneurysm, dissection, vasculitis or significant stenosis. 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 SMA is patent. Renals: Both renal arteries are patent without evidence of aneurysm, dissection, vasculitis, fibromuscular dysplasia or significant stenosis. IMA: IMA is patent. Inflow: Patent without evidence of aneurysm, dissection, vasculitis or significant stenosis. Veins: No obvious venous abnormality within the limitations of this arterial phase study. Incidentally, there is a retroaortic left renal vein. Review of the MIP images confirms the above findings. NON-VASCULAR Hepatobiliary: High-density material in the gallbladder is compatible with stones. No evidence for gallbladder inflammation. No acute abnormality to the liver. Pancreas: Artifact in the region of the pancreas but no gross abnormality. Spleen: Motion artifact in this region but no gross abnormality. Adrenals/Urinary Tract: Normal appearance of the adrenal glands. Large low-density structure along the right kidney upper pole is most compatible with a cyst measuring up to 6.6 cm. Negative for hydronephrosis. No suspicious renal lesions. Urinary bladder is displaced superiorly with wall thickening along the right side of the bladder. Bladder wall thickening is noted on sequence 6, image 159. Stomach/Bowel: Normal appendix. Normal appearance of the stomach. No evidence for bowel dilatation. No focal bowel inflammation. Lymphatic: No significant lymph node enlargement in the abdomen or pelvis.  Reproductive: Prostate is markedly enlarged measuring roughly 6.3 x 5.9 x 8.8 cm. Other: Negative for free fluid. Negative for free air. Evidence for defect along the lateral left abdominal wall on sequence 6, image 116 that may be related to a fat containing hernia. Musculoskeletal: Both hips are located. Complete disc space loss at L2-L3. Multilevel degenerative facet disease  in the lumbar spine. Review of the MIP images confirms the above findings. IMPRESSION: 1. Negative for an aortic aneurysm or dissection. Aortic Atherosclerosis (ICD10-I70.0). 2. Destruction of the disc space at T8-T9 is suggestive for osteomyelitis and discitis at this level. Complete disc space loss at L2-L3 could represent degenerative disc disease but cannot exclude discitis based on the findings at T8-T9. Recommend further characterization of the thoracolumbar spine with MRI. 3. Mild mediastinal lymphadenopathy is nonspecific but may be reactive. 4. Cholelithiasis. 5. Prostate enlargement. Wall thickening along the right side of the bladder is nonspecific and could be related to bladder outlet obstruction. 6. Scattered interstitial thickening in the right lower lobe may represent a combination of atelectasis and chronic changes. Small nodular densities in lungs, largest measuring 5 mm, are likely incidental findings. 7. 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 These results were called by telephone at the time of interpretation on 08/23/2018 at 5:38 pm to Dr. Isla Pence, who verbally acknowledged these results. Electronically Signed   By: Markus Daft M.D.   On: 08/23/2018 17:39    HISTORICAL MICRO/IMAGING  Assessment/Plan:  Incidental finding of thoracic osteomyelitis  - recommend bone biopsy of affected area to send for aerobic and afb culture - after culture results return can devise target regimen. Empiric regimen would be vanco/ceftriaxone x 6 wk - please check sed rate and crp   Will need twice a week labs while on  vancomycin to closely monitor kidney function Will need picc line for infusion/abtx treatment

## 2018-08-24 NOTE — Progress Notes (Signed)
IR consulted for aspiration/biopsy of T8/T9 disc space.  Imaging is suggestive osteomyelitis and discitis.  I have reviewed the images and this will be a difficult procedure due to the level of disease. There is a risk of pneumothorax with disc aspiration at this level and there is already significant spinal stenosis at this level. In addition, patient has recently stopped Pradaxa and currently on IV heparin. Will need to discuss with primary team and family tomorrow as to whether we want to perform any procedure on this patient or just treat empirically.

## 2018-08-25 ENCOUNTER — Inpatient Hospital Stay: Payer: Self-pay

## 2018-08-25 LAB — CBC
HCT: 29.7 % — ABNORMAL LOW (ref 39.0–52.0)
Hemoglobin: 10.3 g/dL — ABNORMAL LOW (ref 13.0–17.0)
MCH: 31.2 pg (ref 26.0–34.0)
MCHC: 34.7 g/dL (ref 30.0–36.0)
MCV: 90 fL (ref 80.0–100.0)
Platelets: 183 10*3/uL (ref 150–400)
RBC: 3.3 MIL/uL — ABNORMAL LOW (ref 4.22–5.81)
RDW: 12.3 % (ref 11.5–15.5)
WBC: 6.6 10*3/uL (ref 4.0–10.5)
nRBC: 0 % (ref 0.0–0.2)

## 2018-08-25 LAB — HEPARIN LEVEL (UNFRACTIONATED)
Heparin Unfractionated: 1.01 IU/mL — ABNORMAL HIGH (ref 0.30–0.70)
Heparin Unfractionated: 0.15 IU/mL — ABNORMAL LOW (ref 0.30–0.70)

## 2018-08-25 LAB — SEDIMENTATION RATE: Sed Rate: 10 mm/hr (ref 0–16)

## 2018-08-25 LAB — NOVEL CORONAVIRUS, NAA (HOSP ORDER, SEND-OUT TO REF LAB; TAT 18-24 HRS): SARS-CoV-2, NAA: NOT DETECTED

## 2018-08-25 LAB — C-REACTIVE PROTEIN: CRP: <0.8 mg/dL (ref <1.0)

## 2018-08-25 MED ORDER — VANCOMYCIN HCL 10 G IV SOLR
1250.0000 mg | Freq: Once | INTRAVENOUS | Status: AC
  Administered 2018-08-25: 19:00:00 1250 mg via INTRAVENOUS
  Filled 2018-08-25: qty 1250

## 2018-08-25 MED ORDER — VANCOMYCIN HCL IN DEXTROSE 750-5 MG/150ML-% IV SOLN: 750.0000 mg | INTRAVENOUS | Status: DC

## 2018-08-25 MED ORDER — DOXAZOSIN MESYLATE 2 MG PO TABS
2.0000 mg | ORAL_TABLET | Freq: Every day | ORAL | Status: DC
  Administered 2018-08-25 – 2018-08-27 (×3): 2 mg via ORAL
  Filled 2018-08-25 (×3): qty 1

## 2018-08-25 MED ORDER — HEPARIN (PORCINE) 25000 UT/250ML-% IV SOLN
800.0000 [IU]/h | INTRAVENOUS | Status: DC
  Administered 2018-08-25: 11:00:00 800 [IU]/h via INTRAVENOUS

## 2018-08-25 MED ORDER — SODIUM CHLORIDE 0.9 % IV SOLN
2.0000 g | INTRAVENOUS | Status: DC
  Administered 2018-08-25 – 2018-08-27 (×3): 2 g via INTRAVENOUS
  Filled 2018-08-25: qty 20
  Filled 2018-08-25 (×2): qty 2

## 2018-08-25 NOTE — Progress Notes (Addendum)
PROGRESS NOTE   Cody Mendoza  KXF:[id number]    DOB: [birth year]    DOA: 08/23/2018  PCP: Crist Infante, MD   I have briefly reviewed patients previous medical records in [facility].  Brief Narrative:  85 year old male with PMH of A. fib on Pradaxa, HTN, mitral valve insufficiency, GERD, hard of hearing, lives at home with his wife, presented with transient trouble speaking which occurred around 10:30 AM on day of admission 6/18 when he was going to the mailbox and also reported trouble dialing the phone.  In ED, stroke code activated.  MRI brain negative for acute stroke.  Chest x-ray showed incidental possible thoracic aneurysm followed by CTA chest and abdomen which was negative for aortic aneurysm or dissection but showed T8-9 osteomyelitis and discitis which was confirmed again on MRI of thoracic spine.  Neurosurgery consulted, patient declined surgery, IR consulted for biopsy cultures and ID for antimicrobial guidance.    Assessment & Plan:   Principal Problem:   Discitis of thoracic region Active Problems:   Hypertension   Atrial fibrillation (HCC)   CKD (chronic kidney disease) stage 2, GFR 60-89 ml/min   Chronic anemia   Discitis   Transient speech difficulty/?  TIA  Reportedly had transient speech difficulty and inability to operate the telephone and no other stroke symptoms.  These resolved quickly and no recurrence.  Etiology not clear.  MRI brain without acute abnormalities.  Patient already on Pradaxa for A. fib.  Pradaxa currently on hold and on IV heparin for possible upcoming procedure.  Work-up of TIA not likely to change management and hence no further work-up pursued especially in this pleasant 85 year old patient was already on anticoagulation.  I discussed with Neuro Hospitalist on call on 6/19 who agrees.  No recurrent TIA or strokelike symptoms.  Visual symptoms reported by nurse this morning are nonspecific.  T8-9 osteomyelitis/discitis with  moderate to severe spinal stenosis  Mild intermittent low back pain but does not seem to be debilitating.  Imaging findings were almost incidental findings.  Neurosurgery consultation appreciated, patient declined surgery, they recommend IR consultation for aspiration/biopsy and ID consult for antibiotics.  No antibiotics started prior to procedure.  I discussed with Dr. Cato Mulligan, Neurosurgery on 6/19, appropriate to treat to avoid further complications i.e. worsening infection, vertebral fractures, worsening spinal stenosis etc.  Ordered ESR and CRP.  I discussed in detail with Dr. Anselm Pancoast, IR and Dr. Baxter Flattery, ID and at this time it is felt that the risks of biopsy outweigh benefits and hence biopsy plans canceled.   As discussed with Dr. Baxter Flattery, PO antibiotics less likely to penetrate and likely to cause more side effects.  Even though there is concern for adverse events from prolonged IV antibiotics such as renal insufficiency, C. difficile etc., weighing risks versus benefits of treating versus observation, it is felt that benefits outweigh risks due to symptoms/patient reported 8/10 mid back pain this morning and actually localized it on examiner's back, MRI findings and risk of worsening and related complications, hence opting to treat.  Patient and family in agreement with this plan. Place PICC line.  IV vancomycin per pharmacy.  IV ceftriaxone 2 g every 24 hours.  Hypertensive urgency/essential hypertension  Presented to the ED with BP of 200/100.  Treated with labetalol in ED.  Continue current doxazosin 2 mg every other day and losartan 50 mg at bedtime.  Added PRN IV hydralazine.  BP 207/90 manually this afternoon.  Changed doxazosin to 2 mg daily including  a dose now.  Increase to 4 mg daily tomorrow if uncontrolled.  Chronic, persistent A. fib  Controlled ventricular rate.  Not on rate control medications.  Home Pradaxa on hold pending procedures.  Continue IV heparin  drip.  Follows annually with Dr. Peter Martinique, Cardiology, last seen in July 2019.  Has periodic asymptomatic slow ventricular rate and a few pauses but <3 seconds.  Moderate mitral insufficiency  Noted  Normocytic anemia  Possibly anemia of chronic disease.  Stable.  Hyponatremia  Appears to be chronic and stable.  Periodically follow BMP as outpatient.  Hard of hearing.  Nonsustained wide-complex tachycardia  Noted 39 beat nonsustained wide-complex tachycardia on telemetry 6/19 at 12:32 AM. ? NSVT Vs A. fib with aberrancy. Likely asymptomatic.  TTE ordered but may not alter his treatment much.  TTE to be done.  No further NSVT noted.  Small nodular lung densities  Incidental findings on CTA chest.  Noted.   Stage III chronic kidney disease  Baseline creatinine probably in the 1.2-1.3 range, at baseline.  BPH   DVT prophylaxis: Currently on IV heparin infusion. Code Status: DNR. Family Communication: I discussed in detail with patient's daughter via phone on 6/19, updated care and answered questions. Disposition: Determined pending further evaluation and management.   Consultants:  Neurosurgery IR ID  Procedures:  None  Antimicrobials:  None   Subjective: Patient reports 8/10 mid back pain this morning, appears that it got worse sometime last night.  No radiation.  RN reported that patient was having "hard time focusing" and had asymmetric pupils.  Patient denies blurred vision, double vision, pain in his eyes.  He reports history of procedure on right eye for retinal repair, history of macular degeneration in left eye and it appears that he has had bilateral cataract surgery and IOL placement.  No other strokelike symptoms.  ROS: As above, otherwise negative.  Objective:  Vitals:   08/25/18 0033 08/25/18 0416 08/25/18 0751 08/25/18 1148  BP: (!) 174/72 (!) 157/81 (!) 166/60 (!) 207/90  Pulse: 69 73 (!) 58 79  Resp: '17 17 16 16  '$ Temp: 97.8 F  (36.6 C) 97.7 F (36.5 C) 97.9 F (36.6 C)   TempSrc: Oral Oral Oral   SpO2: 96% 98% 98% 95%  Weight:      Height:        Examination:  General exam: Pleasant elderly male, moderately built and nourished lying comfortably supine in bed. Respiratory system: Clear to auscultation.  No increased work of breathing. Cardiovascular system: S1 & S2 heard, irregularly irregular. No JVD, murmurs, rubs, gallops or clicks. No pedal edema.  Telemetry personally reviewed: A. fib with ventricular rate mostly in the 50s-60s.  Occasional transient slow ventricular rate in the 30s overnight and had a couple of pauses with maximum of 2.62 seconds.  No further NSVT. Gastrointestinal system: Abdomen is nondistended, soft and nontender. No organomegaly or masses felt. Normal bowel sounds heard. Central nervous system: Alert and oriented.  No focal neurological deficits.  Extremely hard of hearing. Extremities: Symmetric 5 x 5 power. Skin: No rashes, lesions or ulcers Psychiatry: Judgement and insight appear normal. Mood & affect appropriate.     Data Reviewed: I have personally reviewed following labs and imaging studies  CBC: Recent Labs  Lab 08/23/18 1202 08/24/18 0908 08/25/18 0716  WBC 4.8 7.0 6.6  NEUTROABS 3.2  --   --   HGB 10.5* 10.4* 10.3*  HCT 31.2* 30.3* 29.7*  MCV 92.3 90.2 90.0  PLT 219  204 782   Basic Metabolic Panel: Recent Labs  Lab 08/23/18 1202 08/24/18 0908  NA 128* 129*  K 4.2 3.8  CL 95* 95*  CO2 25 25  GLUCOSE 98 141*  BUN 18 18  CREATININE 1.20 1.28*  CALCIUM 9.1 9.1   Liver Function Tests: Recent Labs  Lab 08/23/18 1202  AST 30  ALT 19  ALKPHOS 78  BILITOT 1.4*  PROT 6.6  ALBUMIN 3.7   Cardiac Enzymes: Recent Labs  Lab 08/23/18 1202  TROPONINI <0.03     Recent Results (from the past 240 hour(s))  Culture, blood (routine x 2)     Status: None (Preliminary result)   Collection Time: 08/23/18  8:06 PM   Specimen: BLOOD  Result Value Ref Range  Status   Specimen Description BLOOD RIGHT WRIST  Final   Special Requests   Final    BOTTLES DRAWN AEROBIC AND ANAEROBIC Blood Culture results may not be optimal due to an excessive volume of blood received in culture bottles   Culture   Final    NO GROWTH < 12 HOURS Performed at [facility], [place] [address] [place], [place] [address]    Report Status PENDING  Incomplete  Culture, blood (routine x 2)     Status: None (Preliminary result)   Collection Time: 08/23/18  8:11 PM   Specimen: BLOOD  Result Value Ref Range Status   Specimen Description BLOOD LEFT ANTECUBITAL  Final   Special Requests   Final    BOTTLES DRAWN AEROBIC AND ANAEROBIC Blood Culture adequate volume   Culture   Final    NO GROWTH < 12 HOURS Performed at [facility], [place] [address], [place], [place] [address]    Report Status PENDING  Incomplete         Radiology Studies: Dg Chest 2 View  Result Date: 08/23/2018 CLINICAL DATA:  Altered mental status. EXAM: CHEST - 2 VIEW COMPARISON:  Chest x-rays dated 06/08/2018 and 03/02/2009. FINDINGS: Heart size and mediastinal contours are stable. Coarse interstitial markings again noted throughout both lungs, increased compared to the previous studies, suggesting acute interstitial edema superimposed on chronic interstitial lung disease. No confluent opacity to suggest consolidating pneumonia. No pleural effusion or pneumothorax seen. Retrocardiac density again noted, measuring approximately 8 cm width, not significantly changed compared to the previous study, compatible with aortic ectasia versus aneurysm, less likely hiatal hernia. IMPRESSION: 1. Coarse interstitial markings throughout both lungs, increased compared to the previous studies, suggesting acute interstitial edema superimposed on chronic interstitial lung disease. No evidence of consolidating pneumonia. 2. Persistent density overlying the lower mediastinum, measuring approximately 8 cm width, with  differential considerations of aneurysmal dilatation of the thoracic aorta versus ectasia of the thoracic aorta, less likely hiatal hernia. Recommend further characterization with chest CT if 1 has not been recently performed. Electronically Signed   By: Franki Cabot M.D.   On: 08/23/2018 15:10   Ct Head Wo Contrast  Result Date: 08/23/2018 CLINICAL DATA:  Altered level of consciousness. EXAM: CT HEAD WITHOUT CONTRAST TECHNIQUE: Contiguous axial images were obtained from the base of the skull through the vertex without intravenous contrast. COMPARISON:  06/08/2018 FINDINGS: Brain: Moderate atrophy. Moderate to extensive chronic white matter changes bilaterally appear stable from the prior study. No acute infarct, hemorrhage, mass. No midline shift Vascular: Negative for hyperdense vessel Skull: Negative Sinuses/Orbits: Mucosal edema paranasal sinuses. Bilateral ocular surgery Other: None IMPRESSION: Atrophy and chronic ischemia.  No acute abnormality. Electronically Signed  By: Franchot Gallo M.D.   On: 08/23/2018 12:46   Mr Brain Wo Contrast  Result Date: 08/23/2018 CLINICAL DATA:  Altered level consciousness. Difficulty speaking. Unsteadiness. EXAM: MRI HEAD WITHOUT CONTRAST TECHNIQUE: Multiplanar, multiecho pulse sequences of the brain and surrounding structures were obtained without intravenous contrast. COMPARISON:  Head CT 08/23/2018 and MRI 04/24/2012 FINDINGS: Brain: There is no evidence of acute infarct, intracranial hemorrhage, mass, midline shift, or extra-axial fluid collection. Confluent T2 hyperintensities in the cerebral white matter have progressed from the prior MRI and are particularly notable in the occipital and parietal lobes without cortical involvement, nonspecific but compatible with severe chronic small vessel ischemic disease. There is moderate cerebral atrophy. Vascular: Major intracranial vascular flow voids are preserved. Skull and upper cervical spine: Unremarkable bone marrow  signal. Sinuses/Orbits: Bilateral cataract extraction. Minimal bilateral maxillary sinus mucosal thickening. Trace right mastoid effusion. Other: None. IMPRESSION: 1. No acute intracranial abnormality. 2. Severe chronic small vessel ischemic disease, progressed from 2014. Electronically Signed   By: Logan Bores M.D.   On: 08/23/2018 15:08   Mr Thoracic Spine W Wo Contrast  Result Date: 08/23/2018 CLINICAL DATA:  Initial evaluation for abnormal CT, disc destruction at T8-9. EXAM: MRI THORACIC SPINE WITHOUT CONTRAST TECHNIQUE: Multiplanar, multisequence MR imaging of the thoracic spine was performed. No intravenous contrast was administered. CONTRAST:  5 cc of Gadavist. COMPARISON:  Prior CT from earlier the same day. FINDINGS: Alignment: Mild straightening of the normal mid thoracic kyphosis. No listhesis or malalignment. Vertebrae: Mild height loss at the superior endplates of T1 and T4 are chronic in appearance. Vertebral body height otherwise maintained without evidence for acute or subacute fracture. Underlying bone marrow signal intensity within normal limits. Few scattered benign hemangiomas noted. No worrisome osseous lesions. Severe loss of disc space height with endplate irregularity seen at the T8-9 level, corresponding with abnormality seen on prior CT. Associated fluid signal intensity within the residual T8-9 disc with associated endplate irregularity. Abnormal marrow edema and enhancement within the adjacent T8 and T9 vertebral bodies. Findings compatible with acute osteomyelitis discitis. Edema and enhancement extends into the posterior elements bilaterally, with involvement of the T8-9 facets, which could reflect associated septic arthropathy. Associated mild epidural enhancement at this level without frank epidural abscess or collection. Paraspinous phlegmon and edema seen adjacent to the T8-9 interspace without discrete paraspinous abscess. Resultant moderate to severe spinal stenosis at this  level. No other evidence for acute infection within the thoracic spine. No other abnormal marrow edema or enhancement. Cord: Signal intensity within the thoracic spinal cord is within normal limits. Specifically, no cord signal abnormality seen at the level of T8-9. Paraspinal soft tissues: Paraspinous edema and phlegmon without discrete abscess or collection adjacent to the T8-9 interspace. Paraspinous soft tissues otherwise within normal limits. Trace layering bilateral pleural effusions with associated mild atelectasis. Atherosclerotic change noted within the visualized aorta. 5.7 cm exophytic simple right renal cyst partially visualized. Visualized visceral structures otherwise unremarkable. Disc levels: T1-2: Minimal disc bulge. Mild right greater than left facet hypertrophy. No significant spinal stenosis. Mild to moderate right with mild left foraminal narrowing. T2-3: Minimal disc bulge with bilateral facet hypertrophy. No significant stenosis. T3-4: Minimal disc bulge with bilateral facet hypertrophy, greater on the right. No significant spinal stenosis. Mild right foraminal narrowing. T4-5: Negative interspace. Mild facet hypertrophy. No significant spinal stenosis. Mild right foraminal narrowing. T5-6: Negative interspace. Bilateral facet hypertrophy with associated small joint effusions. No spinal stenosis. Mild bilateral foraminal narrowing. T6-7: Negative interspace. Mild  facet hypertrophy. No significant stenosis. T7-8: Shallow central disc protrusion mildly flattens the ventral thecal sac. Mild facet hypertrophy. No significant spinal stenosis. Foramina remain patent. T8-9: Changes consistent with osteomyelitis discitis with partial obliteration of the T8-9 interspace. Associated bilateral facet arthrosis with ligamentum flavum hypertrophy. Resultant moderate to severe spinal stenosis with mild flattening of the thoracic spinal cord. Thecal sac measures 5 mm in AP diameter at its most narrow point.  No associated cord signal changes. Moderate to severe bilateral foraminal narrowing. T9-10: Negative interspace. Bilateral facet hypertrophy. No significant stenosis. T10-11: Negative interspace. Bilateral facet hypertrophy. No significant stenosis. T11-12: Mild annular disc bulge with facet hypertrophy. Mild spinal stenosis without cord impingement. Foramina remain patent. T12-L1:  Negative. IMPRESSION: 1. Findings consistent with acute osteomyelitis discitis at T8-9 as detailed above. Extension of edema into the posterior elements of T8-9 in about the bilateral T8-9 facets, which could reflect associated septic arthritis. Mild localized epidural enhancement without frank epidural abscess or collection. Resultant moderate to severe spinal stenosis at this level without cord signal changes. 2. Associated paraspinous phlegmon and enhancement adjacent to the T8-9 interspace without discrete paraspinous abscess or drainable fluid collection. 3. Mild multilevel degenerative spondylolysis elsewhere within the thoracic spine without significant spinal stenosis. Mild to moderate bilateral foraminal narrowing within the upper thoracic spine as detailed above. Electronically Signed   By: Jeannine Boga M.D.   On: 08/23/2018 19:59   Ct Angio Chest/abd/pel For Dissection W And/or Wo Contrast  Result Date: 08/23/2018 CLINICAL DATA:  85 year old with chest and back pain. Evaluate for aortic dissection. EXAM: CT ANGIOGRAPHY CHEST, ABDOMEN AND PELVIS TECHNIQUE: Multidetector CT imaging through the chest, abdomen and pelvis was performed using the standard protocol during bolus administration of intravenous contrast. Multiplanar reconstructed images and MIPs were obtained and reviewed to evaluate the vascular anatomy. CONTRAST:  134m OMNIPAQUE IOHEXOL 350 MG/ML SOLN COMPARISON:  Neck CTA 06/08/2018 FINDINGS: CTA CHEST FINDINGS Cardiovascular: Normal caliber of the thoracic aorta with dissection. Atherosclerotic  calcifications throughout the thoracic aorta. Great vessels are patent without dissection. Pulmonary arteries are patent without filling defects or pulmonary emboli. No significant pericardial fluid. There are coronary artery calcifications. Mediastinum/Nodes: Subcarinal tissue is prominent measuring up to 1.5 cm on sequence 6, image 46. Mildly prominent lymph nodes in the AP window. AP window lymph node measuring 1.1 cm on sequence 6, image 36. Mildly prominent right hilar soft tissue. No axillary lymph node enlargement. Lungs/Pleura: Trachea and mainstem bronchi are patent. No large pleural effusions. No large areas of consolidation but there are prominent interstitial densities in the right lower lobe. Findings could be a combination of scarring and atelectasis. Mild thickening along the right major fissure on sequence 8, image 51 that is nonspecific. At least 2 small nodules in the right lower lobe, measuring 5 mm on sequence 6, image 44 and image 57. Pulmonary nodules are likely incidental findings. No suspicious pulmonary nodules. Musculoskeletal: There is marked abnormality at the T8-T9 disc space. There appears to be destruction of the disc space with destruction of the T8 inferior endplate and the T9 superior endplate. In addition, there is extensive osteophytosis or heterotopic bone formation in this region. Increased soft tissue at the level of T8-T9. Findings are concerning for underlying infectious or inflammatory process. Question mild superior compression deformity at T4. Review of the MIP images confirms the above findings. CTA ABDOMEN AND PELVIS FINDINGS VASCULAR Aorta: Atherosclerotic calcifications in the abdominal aorta without aneurysm or dissection. Celiac: Patent without evidence of aneurysm, dissection,  vasculitis or significant stenosis. 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 SMA is patent. Renals: Both renal arteries are patent without evidence of  aneurysm, dissection, vasculitis, fibromuscular dysplasia or significant stenosis. IMA: IMA is patent. Inflow: Patent without evidence of aneurysm, dissection, vasculitis or significant stenosis. Veins: No obvious venous abnormality within the limitations of this arterial phase study. Incidentally, there is a retroaortic left renal vein. Review of the MIP images confirms the above findings. NON-VASCULAR Hepatobiliary: High-density material in the gallbladder is compatible with stones. No evidence for gallbladder inflammation. No acute abnormality to the liver. Pancreas: Artifact in the region of the pancreas but no gross abnormality. Spleen: Motion artifact in this region but no gross abnormality. Adrenals/Urinary Tract: Normal appearance of the adrenal glands. Large low-density structure along the right kidney upper pole is most compatible with a cyst measuring up to 6.6 cm. Negative for hydronephrosis. No suspicious renal lesions. Urinary bladder is displaced superiorly with wall thickening along the right side of the bladder. Bladder wall thickening is noted on sequence 6, image 159. Stomach/Bowel: Normal appendix. Normal appearance of the stomach. No evidence for bowel dilatation. No focal bowel inflammation. Lymphatic: No significant lymph node enlargement in the abdomen or pelvis. Reproductive: Prostate is markedly enlarged measuring roughly 6.3 x 5.9 x 8.8 cm. Other: Negative for free fluid. Negative for free air. Evidence for defect along the lateral left abdominal wall on sequence 6, image 116 that may be related to a fat containing hernia. Musculoskeletal: Both hips are located. Complete disc space loss at L2-L3. 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 Review of the MIP images confirms the above findings. IMPRESSION: 1. Negative for an aortic aneurysm or dissection. Aortic Atherosclerosis (ICD10-I70.0). 2. Destruction of the disc space at T8-T9 is suggestive for osteomyelitis and  discitis at this level. Complete disc space loss at L2-L3 could represent degenerative disc disease but cannot exclude discitis based on the findings at T8-T9. Recommend further characterization of the thoracolumbar spine with MRI. 3. Mild mediastinal lymphadenopathy is nonspecific but may be reactive. 4. Cholelithiasis. 5. Prostate enlargement. Wall thickening along the right side of the bladder is nonspecific and could be related to bladder outlet obstruction. 6. Scattered interstitial thickening in the right lower lobe may represent a combination of atelectasis and chronic changes. Small nodular densities in lungs, largest measuring 5 mm, are likely incidental findings. 7. 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 These results were called by telephone at the time of interpretation on 08/23/2018 at 5:38 pm to Dr. Isla Pence, who verbally acknowledged these results. Electronically Signed   By: Markus Daft M.D.   On: 08/23/2018 17:39        Scheduled Meds:  doxazosin  2 mg Oral Daily   latanoprost  1 drop Both Eyes QHS   losartan  50 mg Oral QHS   pantoprazole  40 mg Oral q morning - 10a   polyvinyl alcohol  1 drop Both Eyes QHS   vitamin B-12  500 mcg Oral Daily   Continuous Infusions:  heparin 800 Units/hr (08/25/18 1034)     LOS: 2 days     Vernell Leep, MD, FACP, [facility]. [facility]  To contact the attending provider between 7A-7P or the covering provider during after hours 7P-7A, please log into the web site [email or web address] and access using universal [place] password for that web site. If you do not have the password, please call the hospital operator.  08/25/2018, 12:23  PM

## 2018-08-25 NOTE — Progress Notes (Signed)
Discussed image guided disc aspiration/bone biopsy with patient and patient's daughter.  Explained that we would probably have to do in CT due to the level of disease.  Patient is essentially asymptomatic from this spinal process.  After a long discussion with patient and daughter, they were leaning towards no biopsy but hadn't made a final decision.  After further discussion with Dr. Baxter Flattery of Infectious Disease, we decided that the risks of the procedure may outweigh the benefits in this situation.  Therefore, no plans for biopsy or disc aspiration at this time.

## 2018-08-25 NOTE — Progress Notes (Signed)
Pharmacy Antibiotic Note  Cody Mendoza is a 85 y.o. male  with acute osteomyelitis discitis to begin vancomycin (he is currently on day 1 of rocephin). ID also following and plans noted for 6 weeks of antibiotics.  -WBC= 6.6, afebrile, SCr= 1.28, CrCl ~ 30   Plan: -vancomycin 1250mg  IV x1 followed by 750mg  IV q24hr (predicted AUC= 504) -Will follow renal function, cultures and clinical progress   Height: 5\' 7"  (170.2 cm) Weight: 143 lb (64.9 kg) IBW/kg (Calculated) : 66.1  Temp (24hrs), Avg:97.9 F (36.6 C), Min:97.5 F (36.4 C), Max:98.4 F (36.9 C)  Recent Labs  Lab 08/23/18 1202 08/24/18 0908 08/25/18 0716  WBC 4.8 7.0 6.6  CREATININE 1.20 1.28*  --     Estimated Creatinine Clearance: 32.4 mL/min (A) (by C-G formula based on SCr of 1.28 mg/dL (H)).    No Known Allergies  Antimicrobials this admission: 6/20 rocephin 6/20 vanc  Dose adjustments this admission:   Microbiology results: 6/18 blood x2- ngtd   Thank you for allowing pharmacy to be a part of this patient's care.  Hildred Laser, PharmD Clinical Pharmacist **Pharmacist phone directory can now be found on [email or web address] ([email or web address]).  Listed under [place].

## 2018-08-25 NOTE — Progress Notes (Signed)
[facility] for Infectious Disease    Date of Admission:  08/23/2018     ID: Cody Mendoza is a 85 y.o. male with thoracic osteomyelitis Principal Problem:   Discitis of thoracic region Active Problems:   Hypertension   Atrial fibrillation (HCC)   CKD (chronic kidney disease) stage 2, GFR 60-89 ml/min   Chronic anemia   Discitis    Subjective: Reports some back pain to affected area, afebrile,   Medications:   doxazosin  2 mg Oral Daily   latanoprost  1 drop Both Eyes QHS   losartan  50 mg Oral QHS   pantoprazole  40 mg Oral q morning - 10a   polyvinyl alcohol  1 drop Both Eyes QHS   vitamin B-12  500 mcg Oral Daily    Objective: Vital signs in last 24 hours: Temp:  [97.7 F (36.5 C)-98.4 F (36.9 C)] 97.9 F (36.6 C) (06/20 0751) Pulse Rate:  [58-79] 79 (06/20 1148) Resp:  [14-19] 16 (06/20 1148) BP: (134-207)/(60-90) 207/90 (06/20 1148) SpO2:  [95 %-98 %] 95 % (06/20 1148) Physical Exam  Constitutional: He is oriented to person, place, and time. He appears well-developed and well-nourished. No distress.  HENT:  Mouth/Throat: Oropharynx is clear and moist. No oropharyngeal exudate.  Cardiovascular: Normal rate, regular rhythm and normal heart sounds. Exam reveals no gallop and no friction rub.  No murmur heard.  Pulmonary/Chest: Effort normal and breath sounds normal. No respiratory distress. He has no wheezes.  Abdominal: Soft. Bowel sounds are normal. He exhibits no distension. There is no tenderness.  Neurological: He is alert and oriented to person, place, and time.  Skin: Skin is warm and dry. No rash noted. No erythema.  Psychiatric: He has a normal mood and affect. His behavior is normal.    Lab Results Recent Labs    08/23/18 1202 08/24/18 0908 08/25/18 0716  WBC 4.8 7.0 6.6  HGB 10.5* 10.4* 10.3*  HCT 31.2* 30.3* 29.7*  NA 128* 129*  --   K 4.2 3.8  --   CL 95* 95*  --   CO2 25 25  --   BUN 18 18  --   CREATININE 1.20 1.28*   --    Liver Panel Recent Labs    08/23/18 1202  PROT 6.6  ALBUMIN 3.7  AST 30  ALT 19  ALKPHOS 78  BILITOT 1.4*   Sedimentation Rate Recent Labs    08/25/18 0716  ESRSEDRATE 10   C-Reactive Protein Recent Labs    08/25/18 0936  CRP <0.8    Microbiology: none Studies/Results: Dg Chest 2 View  Result Date: 08/23/2018 CLINICAL DATA:  Altered mental status. EXAM: CHEST - 2 VIEW COMPARISON:  Chest x-rays dated 06/08/2018 and 03/02/2009. FINDINGS: Heart size and mediastinal contours are stable. Coarse interstitial markings again noted throughout both lungs, increased compared to the previous studies, suggesting acute interstitial edema superimposed on chronic interstitial lung disease. No confluent opacity to suggest consolidating pneumonia. No pleural effusion or pneumothorax seen. Retrocardiac density again noted, measuring approximately 8 cm width, not significantly changed compared to the previous study, compatible with aortic ectasia versus aneurysm, less likely hiatal hernia. IMPRESSION: 1. Coarse interstitial markings throughout both lungs, increased compared to the previous studies, suggesting acute interstitial edema superimposed on chronic interstitial lung disease. No evidence of consolidating pneumonia. 2. Persistent density overlying the lower mediastinum, measuring approximately 8 cm width, with differential considerations of aneurysmal dilatation of the thoracic aorta versus ectasia of the  thoracic aorta, less likely hiatal hernia. Recommend further characterization with chest CT if 1 has not been recently performed. Electronically Signed   By: Franki Cabot M.D.   On: 08/23/2018 15:10   Mr Brain Wo Contrast  Result Date: 08/23/2018 CLINICAL DATA:  Altered level consciousness. Difficulty speaking. Unsteadiness. EXAM: MRI HEAD WITHOUT CONTRAST TECHNIQUE: Multiplanar, multiecho pulse sequences of the brain and surrounding structures were obtained without intravenous  contrast. COMPARISON:  Head CT 08/23/2018 and MRI 04/24/2012 FINDINGS: Brain: There is no evidence of acute infarct, intracranial hemorrhage, mass, midline shift, or extra-axial fluid collection. Confluent T2 hyperintensities in the cerebral white matter have progressed from the prior MRI and are particularly notable in the occipital and parietal lobes without cortical involvement, nonspecific but compatible with severe chronic small vessel ischemic disease. There is moderate cerebral atrophy. Vascular: Major intracranial vascular flow voids are preserved. Skull and upper cervical spine: Unremarkable bone marrow signal. Sinuses/Orbits: Bilateral cataract extraction. Minimal bilateral maxillary sinus mucosal thickening. Trace right mastoid effusion. Other: None. IMPRESSION: 1. No acute intracranial abnormality. 2. Severe chronic small vessel ischemic disease, progressed from 2014. Electronically Signed   By: Logan Bores M.D.   On: 08/23/2018 15:08   Mr Thoracic Spine W Wo Contrast  Result Date: 08/23/2018 CLINICAL DATA:  Initial evaluation for abnormal CT, disc destruction at T8-9. EXAM: MRI THORACIC SPINE WITHOUT CONTRAST TECHNIQUE: Multiplanar, multisequence MR imaging of the thoracic spine was performed. No intravenous contrast was administered. CONTRAST:  5 cc of Gadavist. COMPARISON:  Prior CT from earlier the same day. FINDINGS: Alignment: Mild straightening of the normal mid thoracic kyphosis. No listhesis or malalignment. Vertebrae: Mild height loss at the superior endplates of T1 and T4 are chronic in appearance. Vertebral body height otherwise maintained without evidence for acute or subacute fracture. Underlying bone marrow signal intensity within normal limits. Few scattered benign hemangiomas noted. No worrisome osseous lesions. Severe loss of disc space height with endplate irregularity seen at the T8-9 level, corresponding with abnormality seen on prior CT. Associated fluid signal intensity within  the residual T8-9 disc with associated endplate irregularity. Abnormal marrow edema and enhancement within the adjacent T8 and T9 vertebral bodies. Findings compatible with acute osteomyelitis discitis. Edema and enhancement extends into the posterior elements bilaterally, with involvement of the T8-9 facets, which could reflect associated septic arthropathy. Associated mild epidural enhancement at this level without frank epidural abscess or collection. Paraspinous phlegmon and edema seen adjacent to the T8-9 interspace without discrete paraspinous abscess. Resultant moderate to severe spinal stenosis at this level. No other evidence for acute infection within the thoracic spine. No other abnormal marrow edema or enhancement. Cord: Signal intensity within the thoracic spinal cord is within normal limits. Specifically, no cord signal abnormality seen at the level of T8-9. Paraspinal soft tissues: Paraspinous edema and phlegmon without discrete abscess or collection adjacent to the T8-9 interspace. Paraspinous soft tissues otherwise within normal limits. Trace layering bilateral pleural effusions with associated mild atelectasis. Atherosclerotic change noted within the visualized aorta. 5.7 cm exophytic simple right renal cyst partially visualized. Visualized visceral structures otherwise unremarkable. Disc levels: T1-2: Minimal disc bulge. Mild right greater than left facet hypertrophy. No significant spinal stenosis. Mild to moderate right with mild left foraminal narrowing. T2-3: Minimal disc bulge with bilateral facet hypertrophy. No significant stenosis. T3-4: Minimal disc bulge with bilateral facet hypertrophy, greater on the right. No significant spinal stenosis. Mild right foraminal narrowing. T4-5: Negative interspace. Mild facet hypertrophy. No significant spinal stenosis. Mild right foraminal  narrowing. T5-6: Negative interspace. Bilateral facet hypertrophy with associated small joint effusions. No spinal  stenosis. Mild bilateral foraminal narrowing. T6-7: Negative interspace. Mild facet hypertrophy. No significant stenosis. T7-8: Shallow central disc protrusion mildly flattens the ventral thecal sac. Mild facet hypertrophy. No significant spinal stenosis. Foramina remain patent. T8-9: Changes consistent with osteomyelitis discitis with partial obliteration of the T8-9 interspace. Associated bilateral facet arthrosis with ligamentum flavum hypertrophy. Resultant moderate to severe spinal stenosis with mild flattening of the thoracic spinal cord. Thecal sac measures 5 mm in AP diameter at its most narrow point. No associated cord signal changes. Moderate to severe bilateral foraminal narrowing. T9-10: Negative interspace. Bilateral facet hypertrophy. No significant stenosis. T10-11: Negative interspace. Bilateral facet hypertrophy. No significant stenosis. T11-12: Mild annular disc bulge with facet hypertrophy. Mild spinal stenosis without cord impingement. Foramina remain patent. T12-L1:  Negative. IMPRESSION: 1. Findings consistent with acute osteomyelitis discitis at T8-9 as detailed above. Extension of edema into the posterior elements of T8-9 in about the bilateral T8-9 facets, which could reflect associated septic arthritis. Mild localized epidural enhancement without frank epidural abscess or collection. Resultant moderate to severe spinal stenosis at this level without cord signal changes. 2. Associated paraspinous phlegmon and enhancement adjacent to the T8-9 interspace without discrete paraspinous abscess or drainable fluid collection. 3. Mild multilevel degenerative spondylolysis elsewhere within the thoracic spine without significant spinal stenosis. Mild to moderate bilateral foraminal narrowing within the upper thoracic spine as detailed above. Electronically Signed   By: Jeannine Boga M.D.   On: 08/23/2018 19:59   Ct Angio Chest/abd/pel For Dissection W And/or Wo Contrast  Result Date:  08/23/2018 CLINICAL DATA:  85 year old with chest and back pain. Evaluate for aortic dissection. EXAM: CT ANGIOGRAPHY CHEST, ABDOMEN AND PELVIS TECHNIQUE: Multidetector CT imaging through the chest, abdomen and pelvis was performed using the standard protocol during bolus administration of intravenous contrast. Multiplanar reconstructed images and MIPs were obtained and reviewed to evaluate the vascular anatomy. CONTRAST:  113mL OMNIPAQUE IOHEXOL 350 MG/ML SOLN COMPARISON:  Neck CTA 06/08/2018 FINDINGS: CTA CHEST FINDINGS Cardiovascular: Normal caliber of the thoracic aorta with dissection. Atherosclerotic calcifications throughout the thoracic aorta. Great vessels are patent without dissection. Pulmonary arteries are patent without filling defects or pulmonary emboli. No significant pericardial fluid. There are coronary artery calcifications. Mediastinum/Nodes: Subcarinal tissue is prominent measuring up to 1.5 cm on sequence 6, image 46. Mildly prominent lymph nodes in the AP window. AP window lymph node measuring 1.1 cm on sequence 6, image 36. Mildly prominent right hilar soft tissue. No axillary lymph node enlargement. Lungs/Pleura: Trachea and mainstem bronchi are patent. No large pleural effusions. No large areas of consolidation but there are prominent interstitial densities in the right lower lobe. Findings could be a combination of scarring and atelectasis. Mild thickening along the right major fissure on sequence 8, image 51 that is nonspecific. At least 2 small nodules in the right lower lobe, measuring 5 mm on sequence 6, image 44 and image 57. Pulmonary nodules are likely incidental findings. No suspicious pulmonary nodules. Musculoskeletal: There is marked abnormality at the T8-T9 disc space. There appears to be destruction of the disc space with destruction of the T8 inferior endplate and the T9 superior endplate. In addition, there is extensive osteophytosis or heterotopic bone formation in this  region. Increased soft tissue at the level of T8-T9. Findings are concerning for underlying infectious or inflammatory process. Question mild superior compression deformity at T4. Review of the MIP images confirms the above findings.  CTA ABDOMEN AND PELVIS FINDINGS VASCULAR Aorta: Atherosclerotic calcifications in the abdominal aorta without aneurysm or dissection. Celiac: Patent without evidence of aneurysm, dissection, vasculitis or significant stenosis. 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 SMA is patent. Renals: Both renal arteries are patent without evidence of aneurysm, dissection, vasculitis, fibromuscular dysplasia or significant stenosis. IMA: IMA is patent. Inflow: Patent without evidence of aneurysm, dissection, vasculitis or significant stenosis. Veins: No obvious venous abnormality within the limitations of this arterial phase study. Incidentally, there is a retroaortic left renal vein. Review of the MIP images confirms the above findings. NON-VASCULAR Hepatobiliary: High-density material in the gallbladder is compatible with stones. No evidence for gallbladder inflammation. No acute abnormality to the liver. Pancreas: Artifact in the region of the pancreas but no gross abnormality. Spleen: Motion artifact in this region but no gross abnormality. Adrenals/Urinary Tract: Normal appearance of the adrenal glands. Large low-density structure along the right kidney upper pole is most compatible with a cyst measuring up to 6.6 cm. Negative for hydronephrosis. No suspicious renal lesions. Urinary bladder is displaced superiorly with wall thickening along the right side of the bladder. Bladder wall thickening is noted on sequence 6, image 159. Stomach/Bowel: Normal appendix. Normal appearance of the stomach. No evidence for bowel dilatation. No focal bowel inflammation. Lymphatic: No significant lymph node enlargement in the abdomen or pelvis. Reproductive: Prostate is  markedly enlarged measuring roughly 6.3 x 5.9 x 8.8 cm. Other: Negative for free fluid. Negative for free air. Evidence for defect along the lateral left abdominal wall on sequence 6, image 116 that may be related to a fat containing hernia. Musculoskeletal: Both hips are located. Complete disc space loss at L2-L3. 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 Review of the MIP images confirms the above findings. IMPRESSION: 1. Negative for an aortic aneurysm or dissection. Aortic Atherosclerosis (ICD10-I70.0). 2. Destruction of the disc space at T8-T9 is suggestive for osteomyelitis and discitis at this level. Complete disc space loss at L2-L3 could represent degenerative disc disease but cannot exclude discitis based on the findings at T8-T9. Recommend further characterization of the thoracolumbar spine with MRI. 3. Mild mediastinal lymphadenopathy is nonspecific but may be reactive. 4. Cholelithiasis. 5. Prostate enlargement. Wall thickening along the right side of the bladder is nonspecific and could be related to bladder outlet obstruction. 6. Scattered interstitial thickening in the right lower lobe may represent a combination of atelectasis and chronic changes. Small nodular densities in lungs, largest measuring 5 mm, are likely incidental findings. 7. 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 These results were called by telephone at the time of interpretation on 08/23/2018 at 5:38 pm to Dr. Isla Pence, who verbally acknowledged these results. Electronically Signed   By: Markus Daft M.D.   On: 08/23/2018 17:39     Assessment/Plan: Chronic thoracic osteomyelitis = source unknown as well as to how long this has been with him. He does not recall any recent infections. Spoke extensively with dr Anselm Pancoast and hongalgi about the risk for procedure and yield of positive culture to direct therapy  Given age and risk for broad spectrum abtx   - recommend to do 6 wk of iv abtx. With vancomycin plus  ceftriaxone 2gm iv daily. Please place picc line. If/when he starts to have aki associated with vancomycin will switch to daptomycin - will ask home health to check out of pocket cost for daptomycin initially - will also make sure to discuss risk of  cdifficile associated with broad spectrum abtx   [facility] for Infectious Diseases Cell: [phone] Pager: [phone]  08/25/2018, 1:50 PM

## 2018-08-26 ENCOUNTER — Inpatient Hospital Stay (HOSPITAL_COMMUNITY): Payer: Medicare Other

## 2018-08-26 DIAGNOSIS — M4644 Discitis, unspecified, thoracic region: Secondary | ICD-10-CM

## 2018-08-26 DIAGNOSIS — I472 Ventricular tachycardia: Secondary | ICD-10-CM

## 2018-08-26 DIAGNOSIS — I340 Nonrheumatic mitral (valve) insufficiency: Secondary | ICD-10-CM

## 2018-08-26 LAB — HEPARIN LEVEL (UNFRACTIONATED): Heparin Unfractionated: 0.4 IU/mL (ref 0.30–0.70)

## 2018-08-26 LAB — ECHOCARDIOGRAM COMPLETE
Height: 67 in
Weight: 2288 oz

## 2018-08-26 LAB — CBC
HCT: 30.1 % — ABNORMAL LOW (ref 39.0–52.0)
Hemoglobin: 10.4 g/dL — ABNORMAL LOW (ref 13.0–17.0)
MCH: 31.2 pg (ref 26.0–34.0)
MCHC: 34.6 g/dL (ref 30.0–36.0)
MCV: 90.4 fL (ref 80.0–100.0)
Platelets: 186 10*3/uL (ref 150–400)
RBC: 3.33 MIL/uL — ABNORMAL LOW (ref 4.22–5.81)
RDW: 12.1 % (ref 11.5–15.5)
WBC: 7.4 10*3/uL (ref 4.0–10.5)
nRBC: 0 % (ref 0.0–0.2)

## 2018-08-26 MED ORDER — DABIGATRAN ETEXILATE MESYLATE 150 MG PO CAPS
150.0000 mg | ORAL_CAPSULE | Freq: Two times a day (BID) | ORAL | Status: DC
  Administered 2018-08-26 – 2018-08-27 (×3): 150 mg via ORAL
  Filled 2018-08-26 (×4): qty 1

## 2018-08-26 MED ORDER — SODIUM CHLORIDE 0.9% FLUSH
10.0000 mL | INTRAVENOUS | Status: DC | PRN
  Administered 2018-08-27: 10 mL
  Filled 2018-08-26: qty 40

## 2018-08-26 MED ORDER — CARVEDILOL 3.125 MG PO TABS
3.1250 mg | ORAL_TABLET | Freq: Two times a day (BID) | ORAL | Status: DC
  Administered 2018-08-26 – 2018-08-27 (×3): 3.125 mg via ORAL
  Filled 2018-08-26 (×3): qty 1

## 2018-08-26 MED ORDER — VANCOMYCIN HCL IN DEXTROSE 750-5 MG/150ML-% IV SOLN
750.0000 mg | INTRAVENOUS | Status: DC
  Administered 2018-08-27: 10:00:00 750 mg via INTRAVENOUS
  Filled 2018-08-26: qty 150

## 2018-08-26 MED ORDER — VANCOMYCIN HCL 10 G IV SOLR
1250.0000 mg | Freq: Once | INTRAVENOUS | Status: AC
  Administered 2018-08-26: 13:00:00 1250 mg via INTRAVENOUS
  Filled 2018-08-26: qty 1250

## 2018-08-26 NOTE — Progress Notes (Signed)
Addendum  EP Cardiology input appreciated.  I discussed with Dr. Lovena Le who started carvedilol 3.125 mg twice daily for A. fib with intermittent RVR and NSVT and monitor on telemetry overnight to make sure that this does not worsen his pauses, before discharging home possibly tomorrow.  Patient was updated regarding change in discharge plans.  Vernell Leep, MD, FACP, [facility]. [facility]  To contact the attending provider between 7A-7P or the covering provider during after hours 7P-7A, please log into the web site [email or web address] and access using universal [place] password for that web site. If you do not have the password, please call the hospital operator.

## 2018-08-26 NOTE — Progress Notes (Signed)
Spoke with Ailene Ravel, RN about PICC. Patient able to sign consent. PICC to be placed this am.

## 2018-08-26 NOTE — Progress Notes (Signed)
Peripherally Inserted Central Catheter/Midline Placement  The IV Nurse has discussed with the patient and/or persons authorized to consent for the patient, the purpose of this procedure and the potential benefits and risks involved with this procedure.  The benefits include less needle sticks, lab draws from the catheter, and the patient may be discharged home with the catheter. Risks include, but not limited to, infection, bleeding, blood clot (thrombus formation), and puncture of an artery; nerve damage and irregular heartbeat and possibility to perform a PICC exchange if needed/ordered by physician.  Alternatives to this procedure were also discussed.  Bard Power PICC patient education guide, fact sheet on infection prevention and patient information card has been provided to patient /or left at bedside.    PICC/Midline Placement Documentation  PICC Single Lumen 08/26/18 PICC Right Brachial 36 cm 0 cm (Active)  Indication for Insertion or Continuance of Line Home intravenous therapies (PICC only) 08/26/18 1014  Exposed Catheter (cm) 0 cm 08/26/18 1014  Site Assessment Clean;Dry;Intact 08/26/18 1014  Line Status Flushed;Saline locked;Blood return noted 08/26/18 1014  Dressing Type Transparent 08/26/18 1014  Dressing Status Clean;Dry;Intact;Antimicrobial disc in place 08/26/18 1014  Dressing Change Due 09/02/18 08/26/18 1014       Frances Maywood 08/26/2018, 10:17 AM

## 2018-08-26 NOTE — Progress Notes (Addendum)
ANTICOAGULATION CONSULT NOTE  Pharmacy Consult for Heparin Indication: atrial fibrillation  No Known Allergies  Patient Measurements: Height: 5\' 7"  (170.2 cm) Weight: 143 lb (64.9 kg) IBW/kg (Calculated) : 66.1  Vital Signs: Temp: 97.9 F (36.6 C) (06/21 0416) Temp Source: Oral (06/21 0416) BP: 152/61 (06/21 0416) Pulse Rate: 73 (06/21 0416)  Labs: Recent Labs    08/23/18 1202 08/24/18 0908  08/25/18 0716 08/25/18 1653 08/26/18 0545  HGB 10.5* 10.4*  --  10.3*  --  10.4*  HCT 31.2* 30.3*  --  29.7*  --  30.1*  PLT 219 204  --  183  --  186  HEPARINUNFRC  --  0.24*   < > 1.01* 0.15* 0.40  CREATININE 1.20 1.28*  --   --   --   --   TROPONINI <0.03  --   --   --   --   --    < > = values in this interval not displayed.    Estimated Creatinine Clearance: 32.4 mL/min (A) (by C-G formula based on SCr of 1.28 mg/dL (H)).    Assessment: 85 y.o. male admitted with spinal stenosis, h/o Afib and Pradaxa on hold. Pharmacy consulted to dose heparin. Plan for patient to IR for aspiration/cx.   Heparin level yesterday AM high, rate adjusted.  Heparin level this AM at goal.  No overt bleeding or complications noted.  Goal of Therapy:  Heparin level 0.3-0.7 units/ml Monitor platelets by anticoagulation protocol: Yes   Plan:  Continue heparin at 800 units/hr Monitor daily heparin level and CBC, s/sx bleeding If no aspiration planned, could pradaxa be resumed today?  Marguerite Olea, [facility] Clinical Pharmacist Phone [phone]  08/26/2018 7:10 AM

## 2018-08-26 NOTE — Progress Notes (Signed)
  Echocardiogram 2D Echocardiogram has been performed.  Cody Mendoza 08/26/2018, 11:31 AM

## 2018-08-26 NOTE — Progress Notes (Signed)
PHARMACY CONSULT NOTE FOR:  OUTPATIENT  PARENTERAL ANTIBIOTIC THERAPY (OPAT)  Indication: Osteo  Regimen: Ceftriaxone and Vanc End date: 7/31/202  IV antibiotic discharge orders are pended. To discharging provider:  please sign these orders via discharge navigator,  Select New Orders & click on the button choice - Manage This Unsigned Work.    Levester Fresh, PharmD, BCPS, BCCCP Clinical Pharmacist [phone]  Please check AMION for all [place] numbers  08/26/2018 7:50 AM

## 2018-08-26 NOTE — Progress Notes (Signed)
PROGRESS NOTE   Cody Mendoza  CBJ:[id number]    DOB: 1921-09-02    DOA: 08/23/2018  PCP: Crist Infante, MD   I have briefly reviewed patients previous medical records in [facility]  Brief Narrative:  85 year old male with PMH of A. fib on Pradaxa, HTN, mitral valve insufficiency, GERD, hard of hearing, lives at home with his wife, presented with transient trouble speaking which occurred around 10:30 AM on day of admission 6/18 when he was going to the mailbox and also reported trouble dialing the phone.  In ED, stroke code activated.  MRI brain negative for acute stroke.  Chest x-ray showed incidental possible thoracic aneurysm followed by CTA chest and abdomen which was negative for aortic aneurysm or dissection but showed T8-9 osteomyelitis and discitis which was confirmed again on MRI of thoracic spine.  Neurosurgery consulted, patient declined surgery.  IR was consulted but after extensive discussion with IR and ID, biopsy was deferred due to risks.  PICC line placed for empiric IV antibiotics x6 weeks.  EP cardiology consulted 6/21 due to tachybradycardia (A. fib with aberrancy versus NSVT and pauses up to 2.6 seconds).    Assessment & Plan:   Principal Problem:   Discitis of thoracic region Active Problems:   Hypertension   Atrial fibrillation (HCC)   CKD (chronic kidney disease) stage 2, GFR 60-89 ml/min   Chronic anemia   Discitis   Transient speech difficulty/?  TIA  Reportedly had transient speech difficulty and inability to operate the telephone and no other stroke symptoms.  These resolved quickly and no recurrence.  Etiology not clear.  Could have also been due to uncontrolled hypertension rather than TIA.  MRI brain without acute abnormalities.  Patient already on Pradaxa for A. fib.    Work-up of TIA not likely to change management and hence no further work-up pursued especially in this pleasant 85 year old patient was already on anticoagulation.  I  discussed with Neuro Hospitalist on call on 6/19 who agrees.  No recurrent TIA or strokelike symptoms.  Stable.  Discontinue IV heparin and resume home Pradaxa.  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  Imaging findings were almost incidental findings.  Neurosurgery consultation appreciated, patient declined surgery, they recommended IR consultation for aspiration/biopsy and ID consult for antibiotics.   I discussed with Dr. Cato Mulligan, Neurosurgery on 6/19, appropriate to treat to avoid further complications i.e. worsening infection, vertebral fractures, worsening spinal stenosis etc.  ESR 10, CRP <0.8.  I discussed in detail with Dr. Anselm Pancoast, IR and Dr. Baxter Flattery, ID on 6/20 and at this time it is felt that the risks of biopsy outweigh benefits and hence biopsy deferred and plan to treat empirically.  As discussed with Dr. Baxter Flattery, ID on 6/20 PO antibiotics less likely to penetrate and likely to cause more side effects.  Even though there is concern for adverse events from prolonged IV antibiotics such as renal insufficiency, C. difficile etc., weighing risks versus benefits of treating versus observation, it is felt that benefits outweigh risks due to symptoms/patient reported 8/10 mid back intermittent pain, MRI findings and risk of worsening and related complications as outlined above, hence opting to treat.  Patient and family in agreement with this plan.   PICC line placed 6/21  IV vancomycin and IV ceftriaxone 2 g every 24 hours started on 6/20, plan for 6 weeks as per ID, end date 7/31.  CM  consulted for home health RN/OPAT.  Outpatient follow-up with PCP and ID.  Hypertensive urgency/essential hypertension  Presented to the ED with BP of 200/100.  Treated with labetalol in ED.  Added PRN IV hydralazine.  Due to uncontrolled BP, changed doxazosin to 2 mg daily and continue  losartan 50 mg at bedtime.  Improved control.  Chronic, persistent A. fib  Controlled ventricular rate.  Not on rate control medications.  Temporarily was on IV heparin infusion while procedures were being contemplated, changed back to Pradaxa today.  Follows annually with Dr. Peter Martinique, Cardiology, last seen in July 2019.  Has periodic but transient and asymptomatic slow ventricular rate up to the 30s and a few pauses but <3 seconds.  Also has episodes of nonsustained wide-complex tachycardia which may be NSVT and A. fib with aberrancy.  EP Cardiology consulted 6/21.  Hesitant to start beta-blockers or diltiazem due to concern of making bradycardia and pauses worse.  Likely not a candidate for PPM due to osteomyelitis, advanced age and being asymptomatic.  Repeat echo done this morning and report pending.  Moderate mitral insufficiency  Noted  Normocytic anemia  Possibly anemia of chronic disease.  Stable.  Hyponatremia  Appears to be chronic and stable.  Periodically follow BMP as outpatient.  Hard of hearing.  Nonsustained wide-complex tachycardia (NSVT and possible A. fib with aberrancy)  Noted 39 beat nonsustained wide-complex tachycardia on telemetry 6/19 at 12:32 AM. ? NSVT Vs A. fib with aberrancy. Likely asymptomatic.  TTE ordered but may not alter his treatment much.  TTE done and report pending  No further NSVT noted.  Small nodular lung densities  Incidental findings on CTA chest.  Noted.   Stage III chronic kidney disease  Baseline creatinine probably in the 1.2-1.3 range, at baseline.  BPH   DVT prophylaxis: Currently on IV heparin infusion. Code Status: DNR. Family Communication: I discussed in detail with patient's daughter via patient's speaker phone while at his bedside this morning.  Updated care and answered questions. Disposition: Determined pending further evaluation and management, possibly later today or tomorrow.   Consultants:   Neurosurgery IR ID EP cardiology  Procedures:  PICC line  Antimicrobials:  IV vancomycin and ceftriaxone 6/20 >   Subjective: Mild mid back discomfort on prolonged laying in bed or while changing positions.  Denies any other complaints.  ROS: As above, otherwise negative.  Objective:  Vitals:   08/25/18 2008 08/26/18 0001 08/26/18 0416 08/26/18 0821  BP: (!) 147/79 (!) 114/49 (!) 152/61 (!) 190/100  Pulse: 76 62 73 76  Resp: 18 18 18 18   Temp: 97.8 F (36.6 C) 97.8 F (36.6 C) 97.9 F (36.6 C) 97.7 F (36.5 C)  TempSrc: Oral Oral Oral Oral  SpO2: 98% 94% 98% 97%  Weight:      Height:        Examination:  General exam: Pleasant elderly male, moderately built and nourished lying comfortably supine in bed without distress. Respiratory system: Clear to auscultation.  No increased work of breathing.  Stable Cardiovascular system: S1 & S2 heard, irregularly irregular. No JVD, murmurs, rubs, gallops or clicks. No pedal edema.  Telemetry personally reviewed: Mostly A. fib with controlled ventricular rate.  Occasional bradycardia in the 50s and even rare transient bradycardia in the 30s.  Few pauses up to 2.1 seconds.  No significant NSVT since 6/19. Gastrointestinal system: Abdomen is nondistended, soft and nontender. No organomegaly or masses felt. Normal bowel sounds heard. Central nervous system: Alert and oriented.  No focal neurological deficits.  Extremely hard of hearing. Extremities: Symmetric 5 x 5 power. Skin: No rashes, lesions or ulcers Psychiatry: Judgement and insight appear normal. Mood & affect appropriate.     Data Reviewed: I have personally reviewed following labs and imaging studies  CBC: Recent Labs  Lab 08/23/18 1202 08/24/18 0908 08/25/18 0716 08/26/18 0545  WBC 4.8 7.0 6.6 7.4  NEUTROABS 3.2  --   --   --   HGB 10.5* 10.4* 10.3* 10.4*  HCT 31.2* 30.3* 29.7* 30.1*  MCV 92.3 90.2 90.0 90.4  PLT 219 204 183 811   Basic Metabolic Panel:  Recent Labs  Lab 08/23/18 1202 08/24/18 0908  NA 128* 129*  K 4.2 3.8  CL 95* 95*  CO2 25 25  GLUCOSE 98 141*  BUN 18 18  CREATININE 1.20 1.28*  CALCIUM 9.1 9.1   Liver Function Tests: Recent Labs  Lab 08/23/18 1202  AST 30  ALT 19  ALKPHOS 78  BILITOT 1.4*  PROT 6.6  ALBUMIN 3.7   Cardiac Enzymes: Recent Labs  Lab 08/23/18 1202  TROPONINI <0.03     Recent Results (from the past 240 hour(s))  Culture, blood (routine x 2)     Status: None (Preliminary result)   Collection Time: 08/23/18  8:06 PM   Specimen: BLOOD  Result Value Ref Range Status   Specimen Description BLOOD RIGHT WRIST  Final   Special Requests   Final    BOTTLES DRAWN AEROBIC AND ANAEROBIC Blood Culture results may not be optimal due to an excessive volume of blood received in culture bottles   Culture   Final    NO GROWTH 2 DAYS Performed at [facility], [place] [address], [place], [place] [address]    Report Status PENDING  Incomplete  Culture, blood (routine x 2)     Status: None (Preliminary result)   Collection Time: 08/23/18  8:11 PM   Specimen: BLOOD  Result Value Ref Range Status   Specimen Description BLOOD LEFT ANTECUBITAL  Final   Special Requests   Final    BOTTLES DRAWN AEROBIC AND ANAEROBIC Blood Culture adequate volume   Culture   Final    NO GROWTH 2 DAYS Performed at [facility], [place] [address] [place], [place] [address]    Report Status PENDING  Incomplete  Novel Coronavirus,NAA,(SEND-OUT TO REF LAB - TAT 24-48 hrs); Hosp Order     Status: None   Collection Time: 08/23/18  9:06 PM   Specimen: Nasopharyngeal Swab; Respiratory  Result Value Ref Range Status   SARS-CoV-2, NAA NOT DETECTED NOT DETECTED Final    Comment: (NOTE) This test was developed and its performance characteristics determined by [employer]. This test has not been FDA cleared or approved. This test has been authorized by FDA under an Emergency Use Authorization (EUA). This test is  only authorized for the duration of time the declaration that circumstances exist justifying the authorization of the emergency use of in vitro diagnostic tests for detection of SARS-CoV-2 virus and/or diagnosis of COVID-19 infection under section 564(b)(1) of the Act, 21 U.S.C. 741ULA-4(T)(3), unless the authorization is terminated or revoked sooner. When diagnostic testing is negative, the possibility of a false negative result should be considered in the context of a patient's recent exposures and the presence of clinical signs and symptoms consistent with COVID-19. An individual without symptoms of COVID-19 and who is not shedding SARS-CoV-2 virus would expect to have a negative (not detected) result  in this assay. Performed  At: [facility] [address] [place], [place] [id number] Rush Farmer MD TD:[id number]    [place]  Final    Comment: Performed at [facility], [place] [address], [place], [place] [address]         Radiology Studies: Dg Chest Port 1 View  Result Date: 08/26/2018 CLINICAL DATA:  PICC placement EXAM: PORTABLE CHEST 1 VIEW COMPARISON:  08/23/2018 chest radiograph. FINDINGS: Right PICC terminates in the middle third of the SVC. Stable cardiomediastinal silhouette with mild cardiomegaly and thickening of the posterior mediastinal stripe over the lower thoracic spine. No pneumothorax. No pleural effusion. Lungs appear clear, with no acute consolidative airspace disease and no pulmonary edema. IMPRESSION: 1. Right PICC terminates in the middle third of the SVC. 2. Stable thickening of the posterior mediastinal stripe over the lower thoracic spine compatible with known discitis osteomyelitis. 3. Stable mild cardiomegaly without pulmonary edema. No active pulmonary disease. Electronically Signed   By: Ilona Sorrel M.D.   On: 08/26/2018 11:03   [place] Ekg Site Rite  Result Date: 08/25/2018 If Site Rite image not attached, placement  could not be confirmed due to current cardiac rhythm.       Scheduled Meds: . doxazosin  2 mg Oral Daily  . latanoprost  1 drop Both Eyes QHS  . losartan  50 mg Oral QHS  . pantoprazole  40 mg Oral q morning - 10a  . polyvinyl alcohol  1 drop Both Eyes QHS  . vitamin B-12  500 mcg Oral Daily   Continuous Infusions: . cefTRIAXone (ROCEPHIN)  IV 2 g (08/25/18 1835)  . heparin 800 Units/hr (08/25/18 1034)  . vancomycin    . [START ON 08/27/2018] vancomycin       LOS: 3 days     Vernell Leep, MD, FACP, [facility]  To contact the attending provider between 7A-7P or the covering provider during after hours 7P-7A, please log into the web site [email or web address] and access using universal [place] password for that web site. If you do not have the password, please call the hospital operator.  08/26/2018, 11:22 AM

## 2018-08-26 NOTE — Progress Notes (Signed)
Talked to IV RN, clarified that PICC is ready to use

## 2018-08-26 NOTE — Consult Note (Addendum)
Cardiology Consultation:   Patient ID: Cody Mendoza MRN: [id number]; DOB: 17-Jan-1922  Admit date: 08/23/2018 Date of Consult: 08/26/2018  Primary Care Provider: Crist Infante, MD Primary Cardiologist: Cody Peru, MD  Primary Electrophysiologist:  None    Patient Profile:   Cody Mendoza is a 85 y.o. male with a hx of chronic atrial fibrillation treated w/ rate control and anticoagulated w/ Pradaxa, h/o HTN and mitral regurgitation who was admitted for possible TIA and hypertensive urgency. 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 He is being seen today for the evaluation of NSVT at the request of Cody Mendoza.   History of Present Illness:   Cody Mendoza is followed by Cody Mendoza. His primary cardiac issue has been chronic atrial fibrillation that has been rate control. He is on chronic anticoagulation w/ Pradaxa. Also has mitral regurgitation and HTN.   He lives at home with his wife and recently had change in his baseline. He presented to [facility] on 08/23/18 with transient speech difficulty and inability to operate the telephone. His wife called EMS and CODE stroke was activated but imaging negative for acute stroke. CXR showed incidental possible thoracic aortic aneurysm, followed CTA of chest and abdomen, which showed no aortic aneurysm or dissection, but did show T8-T9 osteomyelitis and discitis. This was confirmed by MRI of thoracic spine. Neurosurgery was consulted but pt declined surgery. He is being treated with antibiotics.   Cardiology consulted to evaluate for arrthymia that was noted earlier on telemetry, either NSVT (39 beats) vs afib w/ aberrancy. This apparently happened 6/19 at 12:32 AM. Unfortunately, I am unable to review tele more than 48 hrs ago. Striped not saved into paper chart. Pt denies any cardiac symptoms during this admission. Denies CP, dyspnea, palpitations, dizziness, syncope/ near syncope. Echo done this admit shows normal LVEF,  60-65%.   Past Medical History:  Diagnosis Date   AF (atrial fibrillation) (HCC)    Asthma    Detached retina    Discitis of thoracic region 08/2018   Diverticulosis    GERD (gastroesophageal reflux disease)    Rosanna Randy syndrome    Hypertension    Lactose intolerance    Mitral insufficiency    Renal calculi     Past Surgical History:  Procedure Laterality Date   cataract surgery     ESOPHAGEAL DILATION     In the past   [place]     Status post syrgical removal of a    TONSILLECTOMY       Home Medications:  Prior to Admission medications   Medication Sig Start Date End Date Taking? Authorizing Provider  Cyanocobalamin (VITAMIN B12) 500 MCG TABS Take 500 mcg by mouth every morning.    Yes [provider]  dabigatran (PRADAXA) 150 MG CAPS Take 1 capsule (150 mg total) by mouth every 12 (twelve) hours. 01/18/12  Yes Cody Mendoza, Peter M, MD  doxazosin (CARDURA) 4 MG tablet Take 2 mg by mouth See admin instructions. Take 1/2 tablet (2 mg) by mouth every other night at bedtime   Yes [provider]  Lactobacillus (PROBIOTIC ACIDOPHILUS PO) Take 1 capsule by mouth daily with supper.    Yes [provider]  latanoprost (XALATAN) 0.005 % ophthalmic solution Place 1 drop into both eyes at bedtime.  09/05/17  Yes [provider]  losartan (COZAAR) 100 MG tablet Take 50 mg by mouth 2 (two) times daily.  Yes [provider]  Multiple Vitamin (MULTIVITAMIN WITH MINERALS) TABS Take 1 tablet by mouth daily with supper.    Yes [provider]  Multiple Vitamins-Minerals (ICAPS AREDS FORMULA PO) Take 1 capsule by mouth 2 (two) times daily with breakfast and lunch.    Yes [provider]  pantoprazole (PROTONIX) 40 MG tablet Take 40 mg by mouth every morning.    Yes [provider]  Polyethyl Glycol-Propyl Glycol (SYSTANE) 0.4-0.3 % SOLN Place 1  drop into both eyes at bedtime.    Yes [provider]    Inpatient Medications: Scheduled Meds:  dabigatran  150 mg Oral Q12H   doxazosin  2 mg Oral Daily   latanoprost  1 drop Both Eyes QHS   losartan  50 mg Oral QHS   pantoprazole  40 mg Oral q morning - 10a   polyvinyl alcohol  1 drop Both Eyes QHS   vitamin B-12  500 mcg Oral Daily   Continuous Infusions:  cefTRIAXone (ROCEPHIN)  IV 2 g (08/25/18 1835)   vancomycin     [START ON 08/27/2018] vancomycin     PRN Meds: acetaminophen **OR** acetaminophen, hydrALAZINE, ondansetron **OR** ondansetron (ZOFRAN) IV, sodium chloride flush  Allergies:   No Known Allergies  Social History:   Social History   Socioeconomic History   Marital status: Married    Spouse name: Not on file   Number of children: 2   Years of education: Not on file   Highest education level: Not on file  Occupational History   Occupation: Careers adviser: RETIRED  Social Needs   Financial resource strain: Not on file   Food insecurity    Worry: Not on file    Inability: Not on file   Transportation needs    Medical: Not on file    Non-medical: Not on file  Tobacco Use   Smoking status: Never Smoker   Smokeless tobacco: Never Used  Substance and Sexual Activity   Alcohol use: Yes    Alcohol/week: 0.0 standard drinks    Comment: rarely   Drug use: Never   Sexual activity: Not on file  Lifestyle   Physical activity    Days per week: Not on file    Minutes per session: Not on file   Stress: Not on file  Relationships   Social connections    Talks on phone: Not on file    Gets together: Not on file    Attends religious service: Not on file    Active member of club or organization: Not on file    Attends meetings of clubs or organizations: Not on file    Relationship status: Not on file   Intimate partner violence    Fear of current or ex partner: Not on file    Emotionally abused:  Not on file    Physically abused: Not on file    Forced sexual activity: Not on file  Other Topics Concern   Not on file  Social History Narrative   Not on file    Family History:    Family History  Problem Relation Age of Onset   Hypertension Mother      ROS:  Please see the history of present illness.   All other ROS reviewed and negative.     Physical Exam/Data:   Vitals:   08/26/18 0001 08/26/18 0416 08/26/18 0821 08/26/18 1203  BP: (!) 114/49 (!) 152/61 (!) 190/100 (!) 162/71  Pulse: 62 73  76 74  Resp: 18 18 18 18   Temp: 97.8 F (36.6 C) 97.9 F (36.6 C) 97.7 F (36.5 C) 98.3 F (36.8 C)  TempSrc: Oral Oral Oral Oral  SpO2: 94% 98% 97% 97%  Weight:      Height:        Intake/Output Summary (Last 24 hours) at 08/26/2018 1211 Last data filed at 08/26/2018 0618 Gross per 24 hour  Intake --  Output 1000 ml  Net -1000 ml   Last 3 Weights 08/23/2018 09/13/2017 08/19/2016  Weight (lbs) 143 lb 147 lb 149 lb 3.2 oz  Weight (kg) 64.864 kg 66.679 kg 67.677 kg     Body mass index is 22.4 kg/m.  General:  Elderly white male, Well nourished, well developed, in no acute distress HEENT: normal Lymph: no adenopathy Neck: no JVD Endocrine:  No thryomegaly Vascular: No carotid bruits; FA pulses 2+ bilaterally without bruits  Cardiac: irregular rhythm, regular rate Lungs:  clear to auscultation bilaterally, no wheezing, rhonchi or rales  Abd: soft, nontender, no hepatomegaly  Ext: no edema Musculoskeletal:  No deformities, BUE and BLE strength normal and equal Skin: warm and dry  Neuro:  CNs 2-12 intact, no focal abnormalities noted Psych:  Normal affect   EKG:  The EKG was personally reviewed and demonstrates:  Atrial fibrillation 78 bpm 08/23/18 Telemetry:  Telemetry was personally reviewed and demonstrates:  Atrial fibrillation CVR with occasional pauses, < 3 sec   Relevant CV Studies: 2D Echo 08/26/18   Laboratory Data:  Chemistry Recent Labs  Lab  08/23/18 1202 08/24/18 0908  NA 128* 129*  K 4.2 3.8  CL 95* 95*  CO2 25 25  GLUCOSE 98 141*  BUN 18 18  CREATININE 1.20 1.28*  CALCIUM 9.1 9.1  GFRNONAA 51* 48*  GFRAA 60* 55*  ANIONGAP 8 9    Recent Labs  Lab 08/23/18 1202  PROT 6.6  ALBUMIN 3.7  AST 30  ALT 19  ALKPHOS 78  BILITOT 1.4*   Hematology Recent Labs  Lab 08/24/18 0908 08/25/18 0716 08/26/18 0545  WBC 7.0 6.6 7.4  RBC 3.36* 3.30* 3.33*  HGB 10.4* 10.3* 10.4*  HCT 30.3* 29.7* 30.1*  MCV 90.2 90.0 90.4  MCH 31.0 31.2 31.2  MCHC 34.3 34.7 34.6  RDW 12.3 12.3 12.1  PLT 204 183 186   Cardiac Enzymes Recent Labs  Lab 08/23/18 1202  TROPONINI <0.03   No results for input(s): TROPIPOC in the last 168 hours.  BNPNo results for input(s): BNP, PROBNP in the last 168 hours.  DDimer No results for input(s): DDIMER in the last 168 hours.  Radiology/Studies:  Dg Chest 2 View  Result Date: 08/23/2018 CLINICAL DATA:  Altered mental status. EXAM: CHEST - 2 VIEW COMPARISON:  Chest x-rays dated 06/08/2018 and 03/02/2009. FINDINGS: Heart size and mediastinal contours are stable. Coarse interstitial markings again noted throughout both lungs, increased compared to the previous studies, suggesting acute interstitial edema superimposed on chronic interstitial lung disease. No confluent opacity to suggest consolidating pneumonia. No pleural effusion or pneumothorax seen. Retrocardiac density again noted, measuring approximately 8 cm width, not significantly changed compared to the previous study, compatible with aortic ectasia versus aneurysm, less likely hiatal hernia. IMPRESSION: 1. Coarse interstitial markings throughout both lungs, increased compared to the previous studies, suggesting acute interstitial edema superimposed on chronic interstitial lung disease. No evidence of consolidating pneumonia. 2. Persistent density overlying the lower mediastinum, measuring approximately 8 cm width, with differential considerations  of aneurysmal dilatation of the  thoracic aorta versus ectasia of the thoracic aorta, less likely hiatal hernia. Recommend further characterization with chest CT if 1 has not been recently performed. Electronically Signed   By: Franki Cabot M.D.   On: 08/23/2018 15:10   Ct Head Wo Contrast  Result Date: 08/23/2018 CLINICAL DATA:  Altered level of consciousness. EXAM: CT HEAD WITHOUT CONTRAST TECHNIQUE: Contiguous axial images were obtained from the base of the skull through the vertex without intravenous contrast. COMPARISON:  06/08/2018 FINDINGS: Brain: Moderate atrophy. Moderate to extensive chronic white matter changes bilaterally appear stable from the prior study. No acute infarct, hemorrhage, mass. No midline shift Vascular: Negative for hyperdense vessel Skull: Negative Sinuses/Orbits: Mucosal edema paranasal sinuses. Bilateral ocular surgery Other: None IMPRESSION: Atrophy and chronic ischemia.  No acute abnormality. Electronically Signed   By: Franchot Gallo M.D.   On: 08/23/2018 12:46   Mr Brain Wo Contrast  Result Date: 08/23/2018 CLINICAL DATA:  Altered level consciousness. Difficulty speaking. Unsteadiness. EXAM: MRI HEAD WITHOUT CONTRAST TECHNIQUE: Multiplanar, multiecho pulse sequences of the brain and surrounding structures were obtained without intravenous contrast. COMPARISON:  Head CT 08/23/2018 and MRI 04/24/2012 FINDINGS: Brain: There is no evidence of acute infarct, intracranial hemorrhage, mass, midline shift, or extra-axial fluid collection. Confluent T2 hyperintensities in the cerebral white matter have progressed from the prior MRI and are particularly notable in the occipital and parietal lobes without cortical involvement, nonspecific but compatible with severe chronic small vessel ischemic disease. There is moderate cerebral atrophy. Vascular: Major intracranial vascular flow voids are preserved. Skull and upper cervical spine: Unremarkable bone marrow signal. Sinuses/Orbits:  Bilateral cataract extraction. Minimal bilateral maxillary sinus mucosal thickening. Trace right mastoid effusion. Other: None. IMPRESSION: 1. No acute intracranial abnormality. 2. Severe chronic small vessel ischemic disease, progressed from 2014. Electronically Signed   By: Logan Bores M.D.   On: 08/23/2018 15:08   Mr Thoracic Spine W Wo Contrast  Result Date: 08/23/2018 CLINICAL DATA:  Initial evaluation for abnormal CT, disc destruction at T8-9. EXAM: MRI THORACIC SPINE WITHOUT CONTRAST TECHNIQUE: Multiplanar, multisequence MR imaging of the thoracic spine was performed. No intravenous contrast was administered. CONTRAST:  5 cc of Gadavist. COMPARISON:  Prior CT from earlier the same day. FINDINGS: Alignment: Mild straightening of the normal mid thoracic kyphosis. No listhesis or malalignment. Vertebrae: Mild height loss at the superior endplates of T1 and T4 are chronic in appearance. Vertebral body height otherwise maintained without evidence for acute or subacute fracture. Underlying bone marrow signal intensity within normal limits. Few scattered benign hemangiomas noted. No worrisome osseous lesions. Severe loss of disc space height with endplate irregularity seen at the T8-9 level, corresponding with abnormality seen on prior CT. Associated fluid signal intensity within the residual T8-9 disc with associated endplate irregularity. Abnormal marrow edema and enhancement within the adjacent T8 and T9 vertebral bodies. Findings compatible with acute osteomyelitis discitis. Edema and enhancement extends into the posterior elements bilaterally, with involvement of the T8-9 facets, which could reflect associated septic arthropathy. Associated mild epidural enhancement at this level without frank epidural abscess or collection. Paraspinous phlegmon and edema seen adjacent to the T8-9 interspace without discrete paraspinous abscess. Resultant moderate to severe spinal stenosis at this level. No other evidence  for acute infection within the thoracic spine. No other abnormal marrow edema or enhancement. Cord: Signal intensity within the thoracic spinal cord is within normal limits. Specifically, no cord signal abnormality seen at the level of T8-9. Paraspinal soft tissues: Paraspinous edema and phlegmon without discrete  abscess or collection adjacent to the T8-9 interspace. Paraspinous soft tissues otherwise within normal limits. Trace layering bilateral pleural effusions with associated mild atelectasis. Atherosclerotic change noted within the visualized aorta. 5.7 cm exophytic simple right renal cyst partially visualized. Visualized visceral structures otherwise unremarkable. Disc levels: T1-2: Minimal disc bulge. Mild right greater than left facet hypertrophy. No significant spinal stenosis. Mild to moderate right with mild left foraminal narrowing. T2-3: Minimal disc bulge with bilateral facet hypertrophy. No significant stenosis. T3-4: Minimal disc bulge with bilateral facet hypertrophy, greater on the right. No significant spinal stenosis. Mild right foraminal narrowing. T4-5: Negative interspace. Mild facet hypertrophy. No significant spinal stenosis. Mild right foraminal narrowing. T5-6: Negative interspace. Bilateral facet hypertrophy with associated small joint effusions. No spinal stenosis. Mild bilateral foraminal narrowing. T6-7: Negative interspace. Mild facet hypertrophy. No significant stenosis. T7-8: Shallow central disc protrusion mildly flattens the ventral thecal sac. Mild facet hypertrophy. No significant spinal stenosis. Foramina remain patent. T8-9: Changes consistent with osteomyelitis discitis with partial obliteration of the T8-9 interspace. Associated bilateral facet arthrosis with ligamentum flavum hypertrophy. Resultant moderate to severe spinal stenosis with mild flattening of the thoracic spinal cord. Thecal sac measures 5 mm in AP diameter at its most narrow point. No associated cord signal  changes. Moderate to severe bilateral foraminal narrowing. T9-10: Negative interspace. Bilateral facet hypertrophy. No significant stenosis. T10-11: Negative interspace. Bilateral facet hypertrophy. No significant stenosis. T11-12: Mild annular disc bulge with facet hypertrophy. Mild spinal stenosis without cord impingement. Foramina remain patent. T12-L1:  Negative. IMPRESSION: 1. Findings consistent with acute osteomyelitis discitis at T8-9 as detailed above. Extension of edema into the posterior elements of T8-9 in about the bilateral T8-9 facets, which could reflect associated septic arthritis. Mild localized epidural enhancement without frank epidural abscess or collection. Resultant moderate to severe spinal stenosis at this level without cord signal changes. 2. Associated paraspinous phlegmon and enhancement adjacent to the T8-9 interspace without discrete paraspinous abscess or drainable fluid collection. 3. Mild multilevel degenerative spondylolysis elsewhere within the thoracic spine without significant spinal stenosis. Mild to moderate bilateral foraminal narrowing within the upper thoracic spine as detailed above. Electronically Signed   By: Jeannine Boga M.D.   On: 08/23/2018 19:59   Dg Chest Port 1 View  Result Date: 08/26/2018 CLINICAL DATA:  PICC placement EXAM: PORTABLE CHEST 1 VIEW COMPARISON:  08/23/2018 chest radiograph. FINDINGS: Right PICC terminates in the middle third of the SVC. Stable cardiomediastinal silhouette with mild cardiomegaly and thickening of the posterior mediastinal stripe over the lower thoracic spine. No pneumothorax. No pleural effusion. Lungs appear clear, with no acute consolidative airspace disease and no pulmonary edema. IMPRESSION: 1. Right PICC terminates in the middle third of the SVC. 2. Stable thickening of the posterior mediastinal stripe over the lower thoracic spine compatible with known discitis osteomyelitis. 3. Stable mild cardiomegaly without  pulmonary edema. No active pulmonary disease. Electronically Signed   By: Ilona Sorrel M.D.   On: 08/26/2018 11:03   Ct Angio Chest/abd/pel For Dissection W And/or Wo Contrast  Result Date: 08/23/2018 CLINICAL DATA:  85 year old with chest and back pain. Evaluate for aortic dissection. EXAM: CT ANGIOGRAPHY CHEST, ABDOMEN AND PELVIS TECHNIQUE: Multidetector CT imaging through the chest, abdomen and pelvis was performed using the standard protocol during bolus administration of intravenous contrast. Multiplanar reconstructed images and MIPs were obtained and reviewed to evaluate the vascular anatomy. CONTRAST:  120mL OMNIPAQUE IOHEXOL 350 MG/ML SOLN COMPARISON:  Neck CTA 06/08/2018 FINDINGS: CTA CHEST FINDINGS Cardiovascular: Normal caliber  of the thoracic aorta with dissection. Atherosclerotic calcifications throughout the thoracic aorta. Great vessels are patent without dissection. Pulmonary arteries are patent without filling defects or pulmonary emboli. No significant pericardial fluid. There are coronary artery calcifications. Mediastinum/Nodes: Subcarinal tissue is prominent measuring up to 1.5 cm on sequence 6, image 46. Mildly prominent lymph nodes in the AP window. AP window lymph node measuring 1.1 cm on sequence 6, image 36. Mildly prominent right hilar soft tissue. No axillary lymph node enlargement. Lungs/Pleura: Trachea and mainstem bronchi are patent. No large pleural effusions. No large areas of consolidation but there are prominent interstitial densities in the right lower lobe. Findings could be a combination of scarring and atelectasis. Mild thickening along the right major fissure on sequence 8, image 51 that is nonspecific. At least 2 small nodules in the right lower lobe, measuring 5 mm on sequence 6, image 44 and image 57. Pulmonary nodules are likely incidental findings. No suspicious pulmonary nodules. Musculoskeletal: There is marked abnormality at the T8-T9 disc space. There appears to  be destruction of the disc space with destruction of the T8 inferior endplate and the T9 superior endplate. In addition, there is extensive osteophytosis or heterotopic bone formation in this region. Increased soft tissue at the level of T8-T9. Findings are concerning for underlying infectious or inflammatory process. Question mild superior compression deformity at T4. Review of the MIP images confirms the above findings. CTA ABDOMEN AND PELVIS FINDINGS VASCULAR Aorta: Atherosclerotic calcifications in the abdominal aorta without aneurysm or dissection. Celiac: Patent without evidence of aneurysm, dissection, vasculitis or significant stenosis. 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 SMA is patent. Renals: Both renal arteries are patent without evidence of aneurysm, dissection, vasculitis, fibromuscular dysplasia or significant stenosis. IMA: IMA is patent. Inflow: Patent without evidence of aneurysm, dissection, vasculitis or significant stenosis. Veins: No obvious venous abnormality within the limitations of this arterial phase study. Incidentally, there is a retroaortic left renal vein. Review of the MIP images confirms the above findings. NON-VASCULAR Hepatobiliary: High-density material in the gallbladder is compatible with stones. No evidence for gallbladder inflammation. No acute abnormality to the liver. Pancreas: Artifact in the region of the pancreas but no gross abnormality. Spleen: Motion artifact in this region but no gross abnormality. Adrenals/Urinary Tract: Normal appearance of the adrenal glands. Large low-density structure along the right kidney upper pole is most compatible with a cyst measuring up to 6.6 cm. Negative for hydronephrosis. No suspicious renal lesions. Urinary bladder is displaced superiorly with wall thickening along the right side of the bladder. Bladder wall thickening is noted on sequence 6, image 159. Stomach/Bowel: Normal appendix. Normal  appearance of the stomach. No evidence for bowel dilatation. No focal bowel inflammation. Lymphatic: No significant lymph node enlargement in the abdomen or pelvis. Reproductive: Prostate is markedly enlarged measuring roughly 6.3 x 5.9 x 8.8 cm. Other: Negative for free fluid. Negative for free air. Evidence for defect along the lateral left abdominal wall on sequence 6, image 116 that may be related to a fat containing hernia. Musculoskeletal: Both hips are located. Complete disc space loss at L2-L3. 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 Review of the MIP images confirms the above findings. IMPRESSION: 1. Negative for an aortic aneurysm or dissection. Aortic Atherosclerosis (ICD10-I70.0). 2. Destruction of the disc space at T8-T9 is suggestive for osteomyelitis and discitis at this level. Complete disc space loss at L2-L3 could represent degenerative disc disease but cannot exclude discitis  based on the findings at T8-T9. Recommend further characterization of the thoracolumbar spine with MRI. 3. Mild mediastinal lymphadenopathy is nonspecific but may be reactive. 4. Cholelithiasis. 5. Prostate enlargement. Wall thickening along the right side of the bladder is nonspecific and could be related to bladder outlet obstruction. 6. Scattered interstitial thickening in the right lower lobe may represent a combination of atelectasis and chronic changes. Small nodular densities in lungs, largest measuring 5 mm, are likely incidental findings. 7. 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 These results were called by telephone at the time of interpretation on 08/23/2018 at 5:38 pm to Dr. Isla Pence, who verbally acknowledged these results. Electronically Signed   By: Markus Daft M.D.   On: 08/23/2018 17:39   [place] Ekg Site Rite  Result Date: 08/25/2018 If Site Rite image not attached, placement could not be confirmed due to current cardiac rhythm.   Assessment and Plan:   NIVAAN DICENZO is a 85  y.o. male with a hx of chronic atrial fibrillation treated w/ rate control and anticoagulated w/ Pradaxa, h/o HTN and mitral regurgitation who was admitted for possible TIA and hypertensive urgency. 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 He is being seen today for the evaluation of NSVT at the request of Cody Mendoza.   1. Arrhthymia: ? NSVT vs afib w/ aberrancy. Per primary team, he had a what appeared to be a 39 beat run of NSVT, that took place on 6/19. Unfortunately, I am unable to review tele more than 48 hrs ago. Striped not saved into paper chart. Pt denies any cardiac symptoms during this admission. Denies CP, dyspnea, palpitations, dizziness, syncope/ near syncope. Echo today shows normal LVEF. He has chronic afib that has been rate controlled w/o medication with occasional pauses, < 3 sec long. K was WNL on 3.8 but no f/u BMPs in the last 2 days. Along with his advanced age, given he was asymptomatic and normal echo, would not advise any additional w/u. Given he has had occasional pauses on tele, would not add  blocker. Resume Pradaxa for afib when safe to do so per primary team.    For questions or updates, please contact [place] Please consult [email or web address] for contact info under   Signed, Cody Mendoza  08/26/2018 12:11 PM   Cardiology attending  Patient seen and examined.  Agree with the findings as noted above.  We are asked to see Cody Mendoza for evaluation of nonsustained ventricular tachycardia as well as transient bradycardia in the setting of chronic atrial fibrillation.  The patient was admitted to the hospital with a TIA.  He was unable to speak for approximately 10 minutes.  He is improved and his symptoms have resolved.  He has been on cardiac monitoring and has noted to have atrial fibrillation with a controlled ventricular response, a rapid ventricular response, as well as a slow ventricular response.  He has had pauses of over 2 seconds but not  over 3 seconds.  He has had some irregular ventricular tachycardia at rates of over 150 bpm.  He was asymptomatic.  He denies a history of syncope.  His exam is notable for him being a pleasant but elderly appearing man in no distress.  The lungs demonstrated no increased work of breathing and the cardiovascular exam was irregularly irregular.  The extremities demonstrated no edema.  Neurologically he is seemed to be intact and appropriate.  Telemetry as noted above.  Assessment and plan. 1.  TIAs -he will be placed  back on Pradaxa.  I will defer the decision about low-dose aspirin to his neurology/primary team. 2.  Nonsustained ventricular tachycardia -he is asymptomatic.  He will undergo watchful waiting. 3.  Atrial fibrillation with a rapid ventricular response, controlled ventricular response, slow ventricular response -at this point, his rates were increased slightly.  Because he is hypertensive, I think it is reasonable to do low-dose Coreg.  Ultimately he may require backup pacing but hopefully this will be avoided. He is currently not a candidate with possible thoracic osteomyelitis.  Cody Peru, MD

## 2018-08-27 ENCOUNTER — Encounter (HOSPITAL_COMMUNITY): Payer: Self-pay

## 2018-08-27 ENCOUNTER — Other Ambulatory Visit: Payer: Self-pay

## 2018-08-27 ENCOUNTER — Emergency Department (HOSPITAL_COMMUNITY): Payer: Medicare Other

## 2018-08-27 ENCOUNTER — Observation Stay (HOSPITAL_COMMUNITY): Payer: Medicare Other

## 2018-08-27 ENCOUNTER — Inpatient Hospital Stay (HOSPITAL_COMMUNITY)
Admission: EM | Admit: 2018-08-27 | Discharge: 2018-08-29 | DRG: 309 | Disposition: A | Discharge status: Home / Self Care | Payer: Medicare Other | Attending: Internal Medicine | Admitting: Internal Medicine

## 2018-08-27 DIAGNOSIS — D649 Anemia, unspecified: Secondary | ICD-10-CM | POA: Diagnosis present

## 2018-08-27 DIAGNOSIS — G459 Transient cerebral ischemic attack, unspecified: Secondary | ICD-10-CM | POA: Diagnosis not present

## 2018-08-27 DIAGNOSIS — E739 Lactose intolerance, unspecified: Secondary | ICD-10-CM | POA: Diagnosis present

## 2018-08-27 DIAGNOSIS — Z9841 Cataract extraction status, right eye: Secondary | ICD-10-CM

## 2018-08-27 DIAGNOSIS — I10 Essential (primary) hypertension: Secondary | ICD-10-CM | POA: Diagnosis present

## 2018-08-27 DIAGNOSIS — Z87442 Personal history of urinary calculi: Secondary | ICD-10-CM

## 2018-08-27 DIAGNOSIS — I4891 Unspecified atrial fibrillation: Principal | ICD-10-CM | POA: Diagnosis present

## 2018-08-27 DIAGNOSIS — M4624 Osteomyelitis of vertebra, thoracic region: Secondary | ICD-10-CM | POA: Diagnosis present

## 2018-08-27 DIAGNOSIS — E871 Hypo-osmolality and hyponatremia: Secondary | ICD-10-CM

## 2018-08-27 DIAGNOSIS — K219 Gastro-esophageal reflux disease without esophagitis: Secondary | ICD-10-CM | POA: Diagnosis present

## 2018-08-27 DIAGNOSIS — Z79899 Other long term (current) drug therapy: Secondary | ICD-10-CM

## 2018-08-27 DIAGNOSIS — Z66 Do not resuscitate: Secondary | ICD-10-CM | POA: Diagnosis present

## 2018-08-27 DIAGNOSIS — Z1159 Encounter for screening for other viral diseases: Secondary | ICD-10-CM

## 2018-08-27 DIAGNOSIS — H9190 Unspecified hearing loss, unspecified ear: Secondary | ICD-10-CM | POA: Diagnosis present

## 2018-08-27 DIAGNOSIS — N182 Chronic kidney disease, stage 2 (mild): Secondary | ICD-10-CM | POA: Diagnosis present

## 2018-08-27 DIAGNOSIS — Z8673 Personal history of transient ischemic attack (TIA), and cerebral infarction without residual deficits: Secondary | ICD-10-CM

## 2018-08-27 DIAGNOSIS — R55 Syncope and collapse: Secondary | ICD-10-CM | POA: Diagnosis present

## 2018-08-27 DIAGNOSIS — J45909 Unspecified asthma, uncomplicated: Secondary | ICD-10-CM | POA: Diagnosis present

## 2018-08-27 DIAGNOSIS — Z9842 Cataract extraction status, left eye: Secondary | ICD-10-CM

## 2018-08-27 DIAGNOSIS — I340 Nonrheumatic mitral (valve) insufficiency: Secondary | ICD-10-CM | POA: Diagnosis present

## 2018-08-27 DIAGNOSIS — M4644 Discitis, unspecified, thoracic region: Secondary | ICD-10-CM | POA: Diagnosis present

## 2018-08-27 DIAGNOSIS — I129 Hypertensive chronic kidney disease with stage 1 through stage 4 chronic kidney disease, or unspecified chronic kidney disease: Secondary | ICD-10-CM | POA: Diagnosis present

## 2018-08-27 DIAGNOSIS — I4811 Longstanding persistent atrial fibrillation: Secondary | ICD-10-CM

## 2018-08-27 DIAGNOSIS — Z8249 Family history of ischemic heart disease and other diseases of the circulatory system: Secondary | ICD-10-CM

## 2018-08-27 DIAGNOSIS — Z7901 Long term (current) use of anticoagulants: Secondary | ICD-10-CM

## 2018-08-27 HISTORY — DX: Transient cerebral ischemic attack, unspecified: G45.9

## 2018-08-27 LAB — BASIC METABOLIC PANEL
Anion gap: 7 (ref 5–15)
BUN: 18 mg/dL (ref 8–23)
CO2: 25 mmol/L (ref 22–32)
Calcium: 8.9 mg/dL (ref 8.9–10.3)
Chloride: 95 mmol/L — ABNORMAL LOW (ref 98–111)
Creatinine, Ser: 1.25 mg/dL — ABNORMAL HIGH (ref 0.61–1.24)
GFR calc Af Amer: 57 mL/min — ABNORMAL LOW (ref >60)
GFR calc non Af Amer: 49 mL/min — ABNORMAL LOW (ref >60)
Glucose, Bld: 100 mg/dL — ABNORMAL HIGH (ref 70–99)
Potassium: 4.2 mmol/L (ref 3.5–5.1)
Sodium: 127 mmol/L — ABNORMAL LOW (ref 135–145)

## 2018-08-27 LAB — RAPID URINE DRUG SCREEN, HOSP PERFORMED
Amphetamines: NOT DETECTED
Barbiturates: NOT DETECTED
Benzodiazepines: NOT DETECTED
Cocaine: NOT DETECTED
Opiates: NOT DETECTED
Tetrahydrocannabinol: NOT DETECTED

## 2018-08-27 LAB — DIFFERENTIAL
Abs Immature Granulocytes: 0.04 10*3/uL (ref 0.00–0.07)
Basophils Absolute: 0 10*3/uL (ref 0.0–0.1)
Basophils Relative: 0 %
Eosinophils Absolute: 0.2 10*3/uL (ref 0.0–0.5)
Eosinophils Relative: 2 %
Immature Granulocytes: 0 %
Lymphocytes Relative: 14 %
Lymphs Abs: 1.3 10*3/uL (ref 0.7–4.0)
Monocytes Absolute: 1.3 10*3/uL — ABNORMAL HIGH (ref 0.1–1.0)
Monocytes Relative: 15 %
Neutro Abs: 6.1 10*3/uL (ref 1.7–7.7)
Neutrophils Relative %: 69 %

## 2018-08-27 LAB — COMPREHENSIVE METABOLIC PANEL
ALT: 24 U/L (ref 0–44)
AST: 35 U/L (ref 15–41)
Albumin: 3.6 g/dL (ref 3.5–5.0)
Alkaline Phosphatase: 84 U/L (ref 38–126)
Anion gap: 12 (ref 5–15)
BUN: 22 mg/dL (ref 8–23)
CO2: 22 mmol/L (ref 22–32)
Calcium: 9 mg/dL (ref 8.9–10.3)
Chloride: 93 mmol/L — ABNORMAL LOW (ref 98–111)
Creatinine, Ser: 1.37 mg/dL — ABNORMAL HIGH (ref 0.61–1.24)
GFR calc Af Amer: 51 mL/min — ABNORMAL LOW (ref >60)
GFR calc non Af Amer: 44 mL/min — ABNORMAL LOW (ref >60)
Glucose, Bld: 113 mg/dL — ABNORMAL HIGH (ref 70–99)
Potassium: 3.9 mmol/L (ref 3.5–5.1)
Sodium: 127 mmol/L — ABNORMAL LOW (ref 135–145)
Total Bilirubin: 1 mg/dL (ref 0.3–1.2)
Total Protein: 6.6 g/dL (ref 6.5–8.1)

## 2018-08-27 LAB — I-STAT CHEM 8, ED
BUN: 23 mg/dL (ref 8–23)
Calcium, Ion: 1.07 mmol/L — ABNORMAL LOW (ref 1.15–1.40)
Chloride: 93 mmol/L — ABNORMAL LOW (ref 98–111)
Creatinine, Ser: 1.3 mg/dL — ABNORMAL HIGH (ref 0.61–1.24)
Glucose, Bld: 110 mg/dL — ABNORMAL HIGH (ref 70–99)
HCT: 35 % — ABNORMAL LOW (ref 39.0–52.0)
Hemoglobin: 11.9 g/dL — ABNORMAL LOW (ref 13.0–17.0)
Potassium: 3.9 mmol/L (ref 3.5–5.1)
Sodium: 126 mmol/L — ABNORMAL LOW (ref 135–145)
TCO2: 26 mmol/L (ref 22–32)

## 2018-08-27 LAB — URINALYSIS, ROUTINE W REFLEX MICROSCOPIC
Bacteria, UA: NONE SEEN
Bilirubin Urine: NEGATIVE
Glucose, UA: NEGATIVE mg/dL
Ketones, ur: 5 mg/dL — AB
Leukocytes,Ua: NEGATIVE
Nitrite: NEGATIVE
Protein, ur: NEGATIVE mg/dL
Specific Gravity, Urine: 1.044 — ABNORMAL HIGH (ref 1.005–1.030)
pH: 6 (ref 5.0–8.0)

## 2018-08-27 LAB — CBC
HCT: 29.8 % — ABNORMAL LOW (ref 39.0–52.0)
HCT: 32.4 % — ABNORMAL LOW (ref 39.0–52.0)
Hemoglobin: 10.2 g/dL — ABNORMAL LOW (ref 13.0–17.0)
Hemoglobin: 11.1 g/dL — ABNORMAL LOW (ref 13.0–17.0)
MCH: 31.1 pg (ref 26.0–34.0)
MCH: 31.4 pg (ref 26.0–34.0)
MCHC: 34.2 g/dL (ref 30.0–36.0)
MCHC: 34.3 g/dL (ref 30.0–36.0)
MCV: 90.9 fL (ref 80.0–100.0)
MCV: 91.8 fL (ref 80.0–100.0)
Platelets: 186 10*3/uL (ref 150–400)
Platelets: 185 10*3/uL (ref 150–400)
RBC: 3.28 MIL/uL — ABNORMAL LOW (ref 4.22–5.81)
RBC: 3.53 MIL/uL — ABNORMAL LOW (ref 4.22–5.81)
RDW: 12.2 % (ref 11.5–15.5)
RDW: 12.2 % (ref 11.5–15.5)
WBC: 7.3 10*3/uL (ref 4.0–10.5)
WBC: 9 10*3/uL (ref 4.0–10.5)
nRBC: 0 % (ref 0.0–0.2)
nRBC: 0 % (ref 0.0–0.2)

## 2018-08-27 LAB — APTT: aPTT: 40 seconds — ABNORMAL HIGH (ref 24–36)

## 2018-08-27 LAB — ETHANOL: Alcohol, Ethyl (B): <10 mg/dL (ref <10)

## 2018-08-27 LAB — CK: Total CK: 79 U/L (ref 49–397)

## 2018-08-27 LAB — PROTIME-INR
INR: 1.7 — ABNORMAL HIGH (ref 0.8–1.2)
Prothrombin Time: 19.7 seconds — ABNORMAL HIGH (ref 11.4–15.2)

## 2018-08-27 LAB — CBG MONITORING, ED: Glucose-Capillary: 107 mg/dL — ABNORMAL HIGH (ref 70–99)

## 2018-08-27 MED ORDER — DOXAZOSIN MESYLATE 2 MG PO TABS
2.0000 mg | ORAL_TABLET | Freq: Every day | ORAL | Status: DC
  Administered 2018-08-28: 2 mg via ORAL
  Filled 2018-08-27 (×2): qty 1

## 2018-08-27 MED ORDER — ACETAMINOPHEN 325 MG PO TABS: 650.0000 mg | ORAL_TABLET | ORAL | Status: DC | PRN

## 2018-08-27 MED ORDER — CARVEDILOL 3.125 MG PO TABS: 3.1250 mg | ORAL_TABLET | Freq: Two times a day (BID) | ORAL | 0 refills | Status: DC

## 2018-08-27 MED ORDER — HYDRALAZINE HCL 20 MG/ML IJ SOLN: 10.0000 mg | INTRAMUSCULAR | Status: DC | PRN

## 2018-08-27 MED ORDER — ADULT MULTIVITAMIN W/MINERALS CH
1.0000 | ORAL_TABLET | Freq: Every day | ORAL | Status: DC
  Administered 2018-08-28 – 2018-08-29 (×2): 1 via ORAL
  Filled 2018-08-27 (×2): qty 1

## 2018-08-27 MED ORDER — CEFTRIAXONE IV (FOR PTA / DISCHARGE USE ONLY): 2.0000 g | INTRAVENOUS | 0 refills | Status: AC

## 2018-08-27 MED ORDER — DOXAZOSIN MESYLATE 4 MG PO TABS: 2.0000 mg | ORAL_TABLET | Freq: Every day | ORAL | 0 refills | Status: AC

## 2018-08-27 MED ORDER — SODIUM CHLORIDE 0.9 % IV SOLN
2.0000 g | INTRAVENOUS | Status: DC
  Administered 2018-08-28 (×2): 2 g via INTRAVENOUS
  Filled 2018-08-27: qty 20
  Filled 2018-08-27 (×2): qty 2

## 2018-08-27 MED ORDER — DABIGATRAN ETEXILATE MESYLATE 150 MG PO CAPS
150.0000 mg | ORAL_CAPSULE | Freq: Two times a day (BID) | ORAL | Status: DC
  Administered 2018-08-28 – 2018-08-29 (×4): 150 mg via ORAL
  Filled 2018-08-27 (×6): qty 1

## 2018-08-27 MED ORDER — SODIUM CHLORIDE 0.9 % IV SOLN
500.0000 mg | Freq: Every day | INTRAVENOUS | Status: DC
  Administered 2018-08-27: 500 mg via INTRAVENOUS
  Filled 2018-08-27: qty 10

## 2018-08-27 MED ORDER — LOSARTAN POTASSIUM 25 MG PO TABS
50.0000 mg | ORAL_TABLET | Freq: Two times a day (BID) | ORAL | Status: DC
  Administered 2018-08-28: 50 mg via ORAL
  Filled 2018-08-27 (×3): qty 2

## 2018-08-27 MED ORDER — DAPTOMYCIN IV (FOR PTA / DISCHARGE USE ONLY): 500.0000 mg | INTRAVENOUS | Status: DC

## 2018-08-27 MED ORDER — DAPTOMYCIN IV (FOR PTA / DISCHARGE USE ONLY): 500.0000 mg | INTRAVENOUS | 0 refills | Status: AC

## 2018-08-27 MED ORDER — SODIUM CHLORIDE 0.9 % IV SOLN
500.0000 mg | Freq: Every day | INTRAVENOUS | Status: DC
  Administered 2018-08-28 (×2): 500 mg via INTRAVENOUS
  Filled 2018-08-27 (×3): qty 10

## 2018-08-27 MED ORDER — POLYVINYL ALCOHOL 1.4 % OP SOLN
1.0000 [drp] | Freq: Every day | OPHTHALMIC | Status: DC
  Administered 2018-08-28 (×2): 1 [drp] via OPHTHALMIC
  Filled 2018-08-27: qty 15

## 2018-08-27 MED ORDER — CARVEDILOL 3.125 MG PO TABS
3.1250 mg | ORAL_TABLET | Freq: Two times a day (BID) | ORAL | Status: DC
  Administered 2018-08-28 – 2018-08-29 (×3): 3.125 mg via ORAL
  Filled 2018-08-27 (×4): qty 1

## 2018-08-27 MED ORDER — PANTOPRAZOLE SODIUM 40 MG PO TBEC
40.0000 mg | DELAYED_RELEASE_TABLET | Freq: Every morning | ORAL | Status: DC
  Administered 2018-08-28 – 2018-08-29 (×2): 40 mg via ORAL
  Filled 2018-08-27 (×2): qty 1

## 2018-08-27 MED ORDER — IOHEXOL 350 MG/ML SOLN
75.0000 mL | Freq: Once | INTRAVENOUS | Status: AC | PRN
  Administered 2018-08-27: 75 mL via INTRAVENOUS

## 2018-08-27 MED ORDER — ACETAMINOPHEN 650 MG RE SUPP: 650.0000 mg | RECTAL | Status: DC | PRN

## 2018-08-27 MED ORDER — STROKE: EARLY STAGES OF RECOVERY BOOK
Freq: Once | Status: AC
  Administered 2018-08-28: 1
  Filled 2018-08-27: qty 1

## 2018-08-27 MED ORDER — ACETAMINOPHEN 160 MG/5ML PO SOLN: 650.0000 mg | ORAL | Status: DC | PRN

## 2018-08-27 MED ORDER — CEFTRIAXONE IV (FOR PTA / DISCHARGE USE ONLY): 2.0000 g | INTRAVENOUS | Status: DC

## 2018-08-27 MED ORDER — HEPARIN SOD (PORK) LOCK FLUSH 100 UNIT/ML IV SOLN
250.0000 [IU] | INTRAVENOUS | Status: AC | PRN
  Administered 2018-08-27: 14:00:00 250 [IU]

## 2018-08-27 MED ORDER — LATANOPROST 0.005 % OP SOLN
1.0000 [drp] | Freq: Every day | OPHTHALMIC | Status: DC
  Administered 2018-08-28: 22:00:00 1 [drp] via OPHTHALMIC
  Filled 2018-08-27 (×2): qty 2.5

## 2018-08-27 NOTE — Progress Notes (Signed)
Pharmacy Antibiotic Note  Cody Mendoza is a 85 y.o. 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  Pharmacy has been consulted for daptomycin dosing. Unfortunately there is no culture data available for this patient so he will be treated with a broad spectrum regimen of daptomycin + ceftriaxone. WBC WNL and patient afebrile. SCr is 1.25 (Baseline: 1.2-1.3) with CrCl ~ 30 ml/min. Baseline CK today was 79.    Plan: Daptomycin 500 mg (~8 mg/kg)  every 24 hours  Monitor renal function and low threshold to decrease to q 48 hours dosing as an outpatient if renal function worsens Weekly CK in OPAT orders  Tat Momoli On Board    Height: 5\' 7"  (170.2 cm) Weight: 143 lb (64.9 kg) IBW/kg (Calculated) : 66.1  Temp (24hrs), Avg:98.1 F (36.7 C), Min:97.6 F (36.4 C), Max:98.6 F (37 C)  Recent Labs  Lab 08/23/18 1202 08/24/18 0908 08/25/18 0716 08/26/18 0545 08/27/18 0608  WBC 4.8 7.0 6.6 7.4 7.3  CREATININE 1.20 1.28*  --   --  1.25*    Estimated Creatinine Clearance: 33.2 mL/min (A) (by C-G formula based on SCr of 1.25 mg/dL (H)).    No Known Allergies  Antimicrobials this admission: 6/20 Vanc>> 6/22 6/20 Ceftriaxone>> 6/22 Dapto >>   Thank you for allowing pharmacy to be a part of this patient's care.  Jimmy Footman, PharmD, BCPS, BCIDP Infectious Diseases Clinical Pharmacist Phone: [phone] 08/27/2018 11:51 AM

## 2018-08-27 NOTE — Code Documentation (Signed)
Responded to Code Stroke called at 2106 for L facial droop and AMS. [id number]. On arrival, NIH-7, CBG-[id number]. CT head negative. CTA-negative for LVO. Plan for MRI and to admit to medical team.

## 2018-08-27 NOTE — Plan of Care (Signed)
  Problem: Nutrition: Goal: Adequate nutrition will be maintained Outcome: Progressing   

## 2018-08-27 NOTE — Care Management Important Message (Signed)
Important Message  Patient Details  Name: Cody Mendoza MRN: [id number] Date of Birth: Jul 25, 1921   Medicare Important Message Given:  Yes     Orbie Pyo 08/27/2018, 2:12 PM

## 2018-08-27 NOTE — Discharge Summary (Signed)
Physician Discharge Summary  WARDEN BUFFA DQQ:[id number] DOB: [birth year]  PCP: Crist Infante, MD  Admit date: 08/23/2018 Discharge date: 08/27/2018  Recommendations for Outpatient Follow-up:  1. Dr. Crist Infante, PCP in 1 week.  To be seen with repeat labs (CBC, BMP & CK) that will be drawn by home health services as per OPAT protocol. 2. Dr. Peter Martinique, Cardiology on 09/17/2018 at 11:40 AM. 3. Dr. Carlyle Basques ID: Office will arrange follow-up appointment.  Home Health: RN Equipment/Devices: Right upper extremity PICC line-to be removed after completion of course of IV antibiotics.  Discharge Condition: Improved and stable. CODE STATUS: DNR. Diet recommendation: Heart healthy diet.  Discharge Diagnoses:  Principal Problem:   Discitis of thoracic region Active Problems:   Hypertension   Atrial fibrillation (HCC)   CKD (chronic kidney disease) stage 2, GFR 60-89 ml/min   Chronic anemia   Discitis   Brief Summary: 85 year old male with PMH of A. fib on Pradaxa, HTN, mitral valve insufficiency, GERD, hard of hearing, lives at home with his wife, presented with transient trouble speaking which occurred around 10:30 AM on day of admission 6/18 when he was going to the mailbox and also reported trouble dialing the phone.  In ED, stroke code activated.  MRI brain negative for acute stroke.  Chest x-ray showed incidental possible thoracic aneurysm followed by CTA chest and abdomen which was negative for aortic aneurysm or dissection but showed T8-9 osteomyelitis and discitis which was confirmed again on MRI of thoracic spine.  Neurosurgery consulted, patient declined surgery.  IR was consulted but after extensive discussion with IR and ID, biopsy was deferred due to risks.  PICC line placed for empiric IV antibiotics x6 weeks.  EP cardiology consulted 6/21 due to tachybradycardia (A. fib with aberrancy versus NSVT and pauses up to 2.6 seconds).    Assessment & Plan:  Transient  speech difficulty/?  TIA  Reportedly had transient speech difficulty and inability to operate the telephone and no other stroke symptoms.  These resolved quickly and no recurrence.  Etiology not clear.  Could have also been due to uncontrolled hypertension rather than TIA.  MRI brain without acute abnormalities.  Patient already on Pradaxa for A. fib.    Work-up of TIA not likely to change management and hence no further work-up pursued especially in this pleasant 85 year old patient was already on anticoagulation.  I discussed with Neuro Hospitalist on call on 6/19 who agrees.  No recurrent TIA or strokelike symptoms.  Stable.  Continue prior home dose of Pradaxa.  Defer decision regarding low-dose aspirin to outpatient follow-up with PCP but would be hesitant to start due to increased bleeding risk.  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  Imaging findings were almost incidental findings.  Neurosurgery consultation appreciated, patient declined surgery, they recommended IR consultation for aspiration/biopsy and ID consult for antibiotics.    I discussed with Dr. Cato Mulligan, Neurosurgery on 6/19, appropriate to treat to avoid further complications i.e. worsening infection, vertebral fractures, worsening spinal stenosis etc. Neurosurgery did not recommend outpatient follow-up.  ESR 10, CRP <0.8.  As discussed with IR and ID on 6/20, at this time it is felt that the risks of biopsy outweigh benefits and hence biopsy deferred and plan to treat suspected infection empirically.  As discussed with ID on 6/20 PO antibiotics less likely to penetrate and likely to cause more side effects.  Even though there is  concern for adverse events from prolonged IV antibiotics such as renal insufficiency, C. difficile etc., weighing risks versus benefits of treating versus observation, it is felt  that benefits outweigh risks due to symptoms/patient reported 8/10 mid back intermittent pain, MRI findings and risk of worsening and related complications as outlined above, hence opting to treat.  Patient and family in agreement with this plan.   PICC line placed 6/21  Patient initially had been started empirically on IV vancomycin and Ceftriaxone in the hospital.  However today Dr. Baxter Flattery, ID has switched him over to IV Daptomycin and Ceftriaxone for a total of 4 weeks.  Avoiding Vancomycin due to related toxicities.  End date of antibiotics 09/21/2018.  CM consulted for home health RN/OPAT.  Outpatient follow-up with PCP and ID.  May need repeat imaging to reassess.  Hypertensive urgency/essential hypertension  Presented to the ED with BP of 200/100.  Treated with labetalol in ED.  Added PRN IV hydralazine.  Has periodic high BPs i.e. 210/98 this morning prior to his a.m. medications.  During this admission, doxazosin was changed from 2 mg every other night to 2 mg daily, carvedilol 3.125 mg twice daily newly started.  Patient was only getting losartan 50 mg at bedtime but at home was on 50 mg twice daily and will resume higher home dose.  Close outpatient follow-up and if still with high BP, may consider increasing doxazosin to 2 mg twice daily.  Asymptomatic.  I discussed with patient's PCP who indicates that there has been difficulty controlling his blood pressure as an outpatient and it is possible that patient is under reporting his back pain which may be contributing to his hypertension.  Chronic, persistent A. fib  Had intermittent mild RVR yesterday.  Was not on rate control medications prior to admission.  Temporarily was on IV heparin infusion while procedures were being contemplated, changed back to Pradaxa.  Follows annually with Dr. Peter Martinique, Cardiology, last seen in July 2019.  Has periodic but transient and asymptomatic slow ventricular rate up to the 30s and a  few pauses but <3 seconds.  Also has episodes of nonsustained wide-complex tachycardia which may be NSVT and A. fib with aberrancy.  EP Cardiology consulted 6/21.  Carvedilol 3.125 mg twice daily started for A. fib with intermittent RVR and NSVT.  No significant or worsening bradycardia or worsening pauses overnight.As per EP cardiology follow-up, with low rates at times, may require backup pacing but poor candidate currently due to suspected thoracic osteomyelitis.  TTE 6/21: LVEF 60-65%.  I discussed with Cardiology who have cleared him for discharge home and keep prior follow-up appointment with Dr. Martinique.  Moderate mitral insufficiency  TTE this admission shows mild mitral valve regurgitation.  Normocytic anemia  Possibly anemia of chronic disease.  Stable.  Hyponatremia  Appears to be chronic and stable.  This dates back to at least 2014.  Asymptomatic.  Periodically follow BMP as outpatient.  As discussed with his PCP today, patient has chronic hyponatremia and current numbers are at his baseline.  Hard of hearing.  Nonsustained wide-complex tachycardia (NSVT and possible A. fib with aberrancy)  Noted 39 beat nonsustained wide-complex tachycardia on telemetry 6/19 at 12:32 AM. ? NSVT Vs A. fib with aberrancy. Likely asymptomatic.  TTE as noted above.  EP cardiology seen and initiated low-dose carvedilol as above.  No further NSVT noted.  Small nodular lung densities  Incidental findings on CTA chest.  Noted.   Stage III chronic kidney disease  Baseline  creatinine probably in the 1.2-1.3 range, at baseline.  BPH    Consultants:  Neurosurgery IR ID EP cardiology  Discharge Instructions  Discharge Instructions    Call MD for:   Complete by: As directed    Recurrent speech difficulties or strokelike symptoms.   Call MD for:  difficulty breathing, headache or visual disturbances   Complete by: As directed    Call MD for:  extreme fatigue    Complete by: As directed    Call MD for:  persistant dizziness or light-headedness   Complete by: As directed    Call MD for:  persistant nausea and vomiting   Complete by: As directed    Call MD for:  severe uncontrolled pain   Complete by: As directed    Call MD for:  temperature >100.4   Complete by: As directed    Diet - low sodium heart healthy   Complete by: As directed    Home infusion instructions Pearson May follow [place] Dosing Protocol; May administer Cathflo as needed to maintain patency of vascular access device.; [place]ushing of vascular access device: per [facility] Protocol: 0.9% NaCl pre/post medica...   Complete by: As directed    Instructions: May follow [place] Dosing Protocol   Instructions: May administer Cathflo as needed to maintain patency of vascular access device.   Instructions: [place]ushing of vascular access device: per [facility] Protocol: 0.9% NaCl pre/post medication administration and prn patency; Heparin 100 u/ml, 99m for implanted ports and Heparin 10u/ml, 579mfor all other central venous catheters.   Instructions: May follow [facility] Anaphylaxis Protocol for First Dose Administration in the home: 0.9% NaCl at 25-50 ml/hr to maintain IV access for protocol meds. Epinephrine 0.3 ml IV/IM PRN and Benadryl 25-50 IV/IM PRN s/s of anaphylaxis.   Instructions: AdIolanfusion Coordinator (RN) to assist per patient IV care needs in the home PRN.   Increase activity slowly   Complete by: As directed        Medication List    TAKE these medications   carvedilol 3.125 MG tablet Commonly known as: COREG Take 1 tablet (3.125 mg total) by mouth 2 (two) times daily with a meal.   cefTRIAXone  IVPB Commonly known as: ROCEPHIN Inject 2 g into the vein daily for 25 days. Indication:  Thoracic osteomyelitis and discitis Last Day of Therapy:  09/21/2018 Labs - Once weekly:  CBC/D and BMP, Labs - Every other week:  ESR and CRP   dabigatran 150 MG Caps  capsule Commonly known as: Pradaxa Take 1 capsule (150 mg total) by mouth every 12 (twelve) hours.   daptomycin  IVPB Commonly known as: CUBICIN Inject 500 mg into the vein daily for 25 days. Indication:  Thoracic osteomyelitis and discitis  Last Day of Therapy:  09/21/2018 Labs - Once weekly:  CBC/D, BMP, and CPK Labs - Every other week:  ESR and CRP   doxazosin 4 MG tablet Commonly known as: CARDURA Take 0.5 tablets (2 mg total) by mouth daily. Start taking on: August 28, 2018 What changed:   when to take this  additional instructions   ICAPS AREDS FORMULA PO Take 1 capsule by mouth 2 (two) times daily with breakfast and lunch.   latanoprost 0.005 % ophthalmic solution Commonly known as: XALATAN Place 1 drop into both eyes at bedtime.   losartan 100 MG tablet Commonly known as: COZAAR Take 50 mg by mouth 2 (two) times daily.   multivitamin with minerals Tabs tablet  Take 1 tablet by mouth daily with supper.   pantoprazole 40 MG tablet Commonly known as: PROTONIX Take 40 mg by mouth every morning.   PROBIOTIC ACIDOPHILUS PO Take 1 capsule by mouth daily with supper.   Systane 0.4-0.3 % Soln Generic drug: Polyethyl Glycol-Propyl Glycol Place 1 drop into both eyes at bedtime.   Vitamin B12 500 MCG Tabs Take 500 mcg by mouth every morning.      Follow-up Information    Crist Infante, MD. Schedule an appointment as soon as possible for a visit in 1 week(s).   Specialty: Internal Medicine Why: To be seen with repeat labs (CBC, BMP & CK) that will be drawn as per OPAT protocol. Contact information: [place] [address] [phone]        Carlyle Basques, MD. Schedule an appointment as soon as possible for a visit.   Specialty: Infectious Diseases Why: MDs office will call you with follow-up appointment.  Please call them back if you do not hear from them in 2-3 business days. Contact information: [address] [place]  [address] [phone]        Martinique, Peter M, MD Follow up on 09/17/2018.   Specialty: Cardiology Why: 11:40 AM. Please keep prior appointment. Contact information: [address] [place] [address] [phone]          No Known Allergies    Procedures/Studies: Dg Chest 2 View  Result Date: 08/23/2018 CLINICAL DATA:  Altered mental status. EXAM: CHEST - 2 VIEW COMPARISON:  Chest x-rays dated 06/08/2018 and 03/02/2009. FINDINGS: Heart size and mediastinal contours are stable. Coarse interstitial markings again noted throughout both lungs, increased compared to the previous studies, suggesting acute interstitial edema superimposed on chronic interstitial lung disease. No confluent opacity to suggest consolidating pneumonia. No pleural effusion or pneumothorax seen. Retrocardiac density again noted, measuring approximately 8 cm width, not significantly changed compared to the previous study, compatible with aortic ectasia versus aneurysm, less likely hiatal hernia. IMPRESSION: 1. Coarse interstitial markings throughout both lungs, increased compared to the previous studies, suggesting acute interstitial edema superimposed on chronic interstitial lung disease. No evidence of consolidating pneumonia. 2. Persistent density overlying the lower mediastinum, measuring approximately 8 cm width, with differential considerations of aneurysmal dilatation of the thoracic aorta versus ectasia of the thoracic aorta, less likely hiatal hernia. Recommend further characterization with chest CT if 1 has not been recently performed. Electronically Signed   By: Franki Cabot M.D.   On: 08/23/2018 15:10   Ct Head Wo Contrast  Result Date: 08/23/2018 CLINICAL DATA:  Altered level of consciousness. EXAM: CT HEAD WITHOUT CONTRAST TECHNIQUE: Contiguous axial images were obtained from the base of the skull through the vertex without intravenous contrast. COMPARISON:  06/08/2018 FINDINGS: Brain: Moderate  atrophy. Moderate to extensive chronic white matter changes bilaterally appear stable from the prior study. No acute infarct, hemorrhage, mass. No midline shift Vascular: Negative for hyperdense vessel Skull: Negative Sinuses/Orbits: Mucosal edema paranasal sinuses. Bilateral ocular surgery Other: None IMPRESSION: Atrophy and chronic ischemia.  No acute abnormality. Electronically Signed   By: Franchot Gallo M.D.   On: 08/23/2018 12:46   Mr Brain Wo Contrast  Result Date: 08/23/2018 CLINICAL DATA:  Altered level consciousness. Difficulty speaking. Unsteadiness. EXAM: MRI HEAD WITHOUT CONTRAST TECHNIQUE: Multiplanar, multiecho pulse sequences of the brain and surrounding structures were obtained without intravenous contrast. COMPARISON:  Head CT 08/23/2018 and MRI 04/24/2012 FINDINGS: Brain: There is no evidence of acute infarct,  intracranial hemorrhage, mass, midline shift, or extra-axial fluid collection. Confluent T2 hyperintensities in the cerebral white matter have progressed from the prior MRI and are particularly notable in the occipital and parietal lobes without cortical involvement, nonspecific but compatible with severe chronic small vessel ischemic disease. There is moderate cerebral atrophy. Vascular: Major intracranial vascular flow voids are preserved. Skull and upper cervical spine: Unremarkable bone marrow signal. Sinuses/Orbits: Bilateral cataract extraction. Minimal bilateral maxillary sinus mucosal thickening. Trace right mastoid effusion. Other: None. IMPRESSION: 1. No acute intracranial abnormality. 2. Severe chronic small vessel ischemic disease, progressed from 2014. Electronically Signed   By: Logan Bores M.D.   On: 08/23/2018 15:08   Mr Thoracic Spine W Wo Contrast  Result Date: 08/23/2018 CLINICAL DATA:  Initial evaluation for abnormal CT, disc destruction at T8-9. EXAM: MRI THORACIC SPINE WITHOUT CONTRAST TECHNIQUE: Multiplanar, multisequence MR imaging of the thoracic spine was  performed. No intravenous contrast was administered. CONTRAST:  5 cc of Gadavist. COMPARISON:  Prior CT from earlier the same day. FINDINGS: Alignment: Mild straightening of the normal mid thoracic kyphosis. No listhesis or malalignment. Vertebrae: Mild height loss at the superior endplates of T1 and T4 are chronic in appearance. Vertebral body height otherwise maintained without evidence for acute or subacute fracture. Underlying bone marrow signal intensity within normal limits. Few scattered benign hemangiomas noted. No worrisome osseous lesions. Severe loss of disc space height with endplate irregularity seen at the T8-9 level, corresponding with abnormality seen on prior CT. Associated fluid signal intensity within the residual T8-9 disc with associated endplate irregularity. Abnormal marrow edema and enhancement within the adjacent T8 and T9 vertebral bodies. Findings compatible with acute osteomyelitis discitis. Edema and enhancement extends into the posterior elements bilaterally, with involvement of the T8-9 facets, which could reflect associated septic arthropathy. Associated mild epidural enhancement at this level without frank epidural abscess or collection. Paraspinous phlegmon and edema seen adjacent to the T8-9 interspace without discrete paraspinous abscess. Resultant moderate to severe spinal stenosis at this level. No other evidence for acute infection within the thoracic spine. No other abnormal marrow edema or enhancement. Cord: Signal intensity within the thoracic spinal cord is within normal limits. Specifically, no cord signal abnormality seen at the level of T8-9. Paraspinal soft tissues: Paraspinous edema and phlegmon without discrete abscess or collection adjacent to the T8-9 interspace. Paraspinous soft tissues otherwise within normal limits. Trace layering bilateral pleural effusions with associated mild atelectasis. Atherosclerotic change noted within the visualized aorta. 5.7 cm  exophytic simple right renal cyst partially visualized. Visualized visceral structures otherwise unremarkable. Disc levels: T1-2: Minimal disc bulge. Mild right greater than left facet hypertrophy. No significant spinal stenosis. Mild to moderate right with mild left foraminal narrowing. T2-3: Minimal disc bulge with bilateral facet hypertrophy. No significant stenosis. T3-4: Minimal disc bulge with bilateral facet hypertrophy, greater on the right. No significant spinal stenosis. Mild right foraminal narrowing. T4-5: Negative interspace. Mild facet hypertrophy. No significant spinal stenosis. Mild right foraminal narrowing. T5-6: Negative interspace. Bilateral facet hypertrophy with associated small joint effusions. No spinal stenosis. Mild bilateral foraminal narrowing. T6-7: Negative interspace. Mild facet hypertrophy. No significant stenosis. T7-8: Shallow central disc protrusion mildly flattens the ventral thecal sac. Mild facet hypertrophy. No significant spinal stenosis. Foramina remain patent. T8-9: Changes consistent with osteomyelitis discitis with partial obliteration of the T8-9 interspace. Associated bilateral facet arthrosis with ligamentum flavum hypertrophy. Resultant moderate to severe spinal stenosis with mild flattening of the thoracic spinal cord. Thecal sac measures 5 mm in  AP diameter at its most narrow point. No associated cord signal changes. Moderate to severe bilateral foraminal narrowing. T9-10: Negative interspace. Bilateral facet hypertrophy. No significant stenosis. T10-11: Negative interspace. Bilateral facet hypertrophy. No significant stenosis. T11-12: Mild annular disc bulge with facet hypertrophy. Mild spinal stenosis without cord impingement. Foramina remain patent. T12-L1:  Negative. IMPRESSION: 1. Findings consistent with acute osteomyelitis discitis at T8-9 as detailed above. Extension of edema into the posterior elements of T8-9 in about the bilateral T8-9 facets, which could  reflect associated septic arthritis. Mild localized epidural enhancement without frank epidural abscess or collection. Resultant moderate to severe spinal stenosis at this level without cord signal changes. 2. Associated paraspinous phlegmon and enhancement adjacent to the T8-9 interspace without discrete paraspinous abscess or drainable fluid collection. 3. Mild multilevel degenerative spondylolysis elsewhere within the thoracic spine without significant spinal stenosis. Mild to moderate bilateral foraminal narrowing within the upper thoracic spine as detailed above. Electronically Signed   By: Jeannine Boga M.D.   On: 08/23/2018 19:59   Dg Chest Port 1 View  Result Date: 08/26/2018 CLINICAL DATA:  PICC placement EXAM: PORTABLE CHEST 1 VIEW COMPARISON:  08/23/2018 chest radiograph. FINDINGS: Right PICC terminates in the middle third of the SVC. Stable cardiomediastinal silhouette with mild cardiomegaly and thickening of the posterior mediastinal stripe over the lower thoracic spine. No pneumothorax. No pleural effusion. Lungs appear clear, with no acute consolidative airspace disease and no pulmonary edema. IMPRESSION: 1. Right PICC terminates in the middle third of the SVC. 2. Stable thickening of the posterior mediastinal stripe over the lower thoracic spine compatible with known discitis osteomyelitis. 3. Stable mild cardiomegaly without pulmonary edema. No active pulmonary disease. Electronically Signed   By: Ilona Sorrel M.D.   On: 08/26/2018 11:03   Ct Angio Chest/abd/pel For Dissection W And/or Wo Contrast  Result Date: 08/23/2018 CLINICAL DATA:  85 year old with chest and back pain. Evaluate for aortic dissection. EXAM: CT ANGIOGRAPHY CHEST, ABDOMEN AND PELVIS TECHNIQUE: Multidetector CT imaging through the chest, abdomen and pelvis was performed using the standard protocol during bolus administration of intravenous contrast. Multiplanar reconstructed images and MIPs were obtained and  reviewed to evaluate the vascular anatomy. CONTRAST:  125m OMNIPAQUE IOHEXOL 350 MG/ML SOLN COMPARISON:  Neck CTA 06/08/2018 FINDINGS: CTA CHEST FINDINGS Cardiovascular: Normal caliber of the thoracic aorta with dissection. Atherosclerotic calcifications throughout the thoracic aorta. Great vessels are patent without dissection. Pulmonary arteries are patent without filling defects or pulmonary emboli. No significant pericardial fluid. There are coronary artery calcifications. Mediastinum/Nodes: Subcarinal tissue is prominent measuring up to 1.5 cm on sequence 6, image 46. Mildly prominent lymph nodes in the AP window. AP window lymph node measuring 1.1 cm on sequence 6, image 36. Mildly prominent right hilar soft tissue. No axillary lymph node enlargement. Lungs/Pleura: Trachea and mainstem bronchi are patent. No large pleural effusions. No large areas of consolidation but there are prominent interstitial densities in the right lower lobe. Findings could be a combination of scarring and atelectasis. Mild thickening along the right major fissure on sequence 8, image 51 that is nonspecific. At least 2 small nodules in the right lower lobe, measuring 5 mm on sequence 6, image 44 and image 57. Pulmonary nodules are likely incidental findings. No suspicious pulmonary nodules. Musculoskeletal: There is marked abnormality at the T8-T9 disc space. There appears to be destruction of the disc space with destruction of the T8 inferior endplate and the T9 superior endplate. In addition, there is extensive osteophytosis or heterotopic bone  formation in this region. Increased soft tissue at the level of T8-T9. Findings are concerning for underlying infectious or inflammatory process. Question mild superior compression deformity at T4. Review of the MIP images confirms the above findings. CTA ABDOMEN AND PELVIS FINDINGS VASCULAR Aorta: Atherosclerotic calcifications in the abdominal aorta without aneurysm or dissection. Celiac:  Patent without evidence of aneurysm, dissection, vasculitis or significant stenosis. 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 SMA is patent. Renals: Both renal arteries are patent without evidence of aneurysm, dissection, vasculitis, fibromuscular dysplasia or significant stenosis. IMA: IMA is patent. Inflow: Patent without evidence of aneurysm, dissection, vasculitis or significant stenosis. Veins: No obvious venous abnormality within the limitations of this arterial phase study. Incidentally, there is a retroaortic left renal vein. Review of the MIP images confirms the above findings. NON-VASCULAR Hepatobiliary: High-density material in the gallbladder is compatible with stones. No evidence for gallbladder inflammation. No acute abnormality to the liver. Pancreas: Artifact in the region of the pancreas but no gross abnormality. Spleen: Motion artifact in this region but no gross abnormality. Adrenals/Urinary Tract: Normal appearance of the adrenal glands. Large low-density structure along the right kidney upper pole is most compatible with a cyst measuring up to 6.6 cm. Negative for hydronephrosis. No suspicious renal lesions. Urinary bladder is displaced superiorly with wall thickening along the right side of the bladder. Bladder wall thickening is noted on sequence 6, image 159. Stomach/Bowel: Normal appendix. Normal appearance of the stomach. No evidence for bowel dilatation. No focal bowel inflammation. Lymphatic: No significant lymph node enlargement in the abdomen or pelvis. Reproductive: Prostate is markedly enlarged measuring roughly 6.3 x 5.9 x 8.8 cm. Other: Negative for free fluid. Negative for free air. Evidence for defect along the lateral left abdominal wall on sequence 6, image 116 that may be related to a fat containing hernia. Musculoskeletal: Both hips are located. Complete disc space loss at L2-L3. 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  Review of the MIP images confirms the above findings. IMPRESSION: 1. Negative for an aortic aneurysm or dissection. Aortic Atherosclerosis (ICD10-I70.0). 2. Destruction of the disc space at T8-T9 is suggestive for osteomyelitis and discitis at this level. Complete disc space loss at L2-L3 could represent degenerative disc disease but cannot exclude discitis based on the findings at T8-T9. Recommend further characterization of the thoracolumbar spine with MRI. 3. Mild mediastinal lymphadenopathy is nonspecific but may be reactive. 4. Cholelithiasis. 5. Prostate enlargement. Wall thickening along the right side of the bladder is nonspecific and could be related to bladder outlet obstruction. 6. Scattered interstitial thickening in the right lower lobe may represent a combination of atelectasis and chronic changes. Small nodular densities in lungs, largest measuring 5 mm, are likely incidental findings. 7. 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 These results were called by telephone at the time of interpretation on 08/23/2018 at 5:38 pm to Dr. Isla Pence, who verbally acknowledged these results. Electronically Signed   By: Markus Daft M.D.   On: 08/23/2018 17:39   [place] Ekg Site Rite  Result Date: 08/25/2018 If Site Rite image not attached, placement could not be confirmed due to current cardiac rhythm.     Subjective: No new complaints.  Mild intermittent back discomfort on prolonged lying or during shifting position.  No chest pain, dyspnea, headache, dizziness, lightheadedness or palpitations.  No other complaints reported.  As per RN, no acute issues noted.  Discharge Exam:  Vitals:   08/27/18 0748 08/27/18  9518 08/27/18 0925 08/27/18 1219  BP: (!) 202/109 (!) 210/98 (!) 141/71 (!) 156/67  Pulse: 74   65  Resp: 18   17  Temp: 97.9 F (36.6 C)   97.9 F (36.6 C)  TempSrc: Oral   Oral  SpO2: 97%   98%  Weight:      Height:        General exam: Pleasant elderly male, moderately built and  nourished sitting up comfortably in chair eating breakfast this morning. Respiratory system: Clear to auscultation.  No increased work of breathing.   Cardiovascular system: S1 & S2 heard, irregularly irregular. No JVD, murmurs, rubs, gallops or clicks. No pedal edema.    Telemetry personally reviewed: A. fib with ventricular rate in the 60s this morning.  50s-60s overnight and occasionally in the 40s at night.  Had 1 or 2 pauses and the longest was about 2.25 seconds. Gastrointestinal system: Abdomen is nondistended, soft and nontender. No organomegaly or masses felt. Normal bowel sounds heard. Central nervous system: Alert and oriented.  No focal neurological deficits.  Extremely hard of hearing. Extremities: Symmetric 5 x 5 power. Skin: No rashes, lesions or ulcers Psychiatry: Judgement and insight appear normal. Mood & affect appropriate.   The results of significant diagnostics from this hospitalization (including imaging, microbiology, ancillary and laboratory) are listed below for reference.     Microbiology: Recent Results (from the past 240 hour(s))  Culture, blood (routine x 2)     Status: None (Preliminary result)   Collection Time: 08/23/18  8:06 PM   Specimen: BLOOD  Result Value Ref Range Status   Specimen Description BLOOD RIGHT WRIST  Final   Special Requests   Final    BOTTLES DRAWN AEROBIC AND ANAEROBIC Blood Culture results may not be optimal due to an excessive volume of blood received in culture bottles   Culture   Final    NO GROWTH 4 DAYS Performed at [facility], [place] [address], [place], [place] [address]    Report Status PENDING  Incomplete  Culture, blood (routine x 2)     Status: None (Preliminary result)   Collection Time: 08/23/18  8:11 PM   Specimen: BLOOD  Result Value Ref Range Status   Specimen Description BLOOD LEFT ANTECUBITAL  Final   Special Requests   Final    BOTTLES DRAWN AEROBIC AND ANAEROBIC Blood Culture adequate volume   Culture    Final    NO GROWTH 4 DAYS Performed at [facility], [place] [address] [place], [place] [address]    Report Status PENDING  Incomplete  Novel Coronavirus,NAA,(SEND-OUT TO REF LAB - TAT 24-48 hrs); Hosp Order     Status: None   Collection Time: 08/23/18  9:06 PM   Specimen: Nasopharyngeal Swab; Respiratory  Result Value Ref Range Status   SARS-CoV-2, NAA NOT DETECTED NOT DETECTED Final    Comment: (NOTE) This test was developed and its performance characteristics determined by [employer]. This test has not been FDA cleared or approved. This test has been authorized by FDA under an Emergency Use Authorization (EUA). This test is only authorized for the duration of time the declaration that circumstances exist justifying the authorization of the emergency use of in vitro diagnostic tests for detection of SARS-CoV-2 virus and/or diagnosis of COVID-19 infection under section 564(b)(1) of the Act, 21 U.S.C. 601UXN-2(T)(5), unless the authorization is terminated or revoked sooner. When diagnostic testing is negative, the possibility of a false negative result should be  considered in the context of a patient's recent exposures and the presence of clinical signs and symptoms consistent with COVID-19. An individual without symptoms of COVID-19 and who is not shedding SARS-CoV-2 virus would expect to have a negative (not detected) result in this assay. Performed  At: [facility] [address] [place], [place] [id number] Rush Farmer MD WU:[id number]    [place]  Final    Comment: Performed at [facility], [place] [address] [place], [place] [address]     Labs: CBC: Recent Labs  Lab 08/23/18 1202 08/24/18 0908 08/25/18 0716 08/26/18 0545 08/27/18 0608  WBC 4.8 7.0 6.6 7.4 7.3  NEUTROABS 3.2  --   --   --   --   HGB 10.5* 10.4* 10.3* 10.4* 10.2*  HCT 31.2* 30.3* 29.7* 30.1* 29.8*  MCV 92.3 90.2 90.0 90.4 90.9  PLT 219 204 183  186 308   Basic Metabolic Panel: Recent Labs  Lab 08/23/18 1202 08/24/18 0908 08/27/18 0608  NA 128* 129* 127*  K 4.2 3.8 4.2  CL 95* 95* 95*  CO2 25 25 25   GLUCOSE 98 141* 100*  BUN 18 18 18   CREATININE 1.20 1.28* 1.25*  CALCIUM 9.1 9.1 8.9   Liver Function Tests: Recent Labs  Lab 08/23/18 1202  AST 30  ALT 19  ALKPHOS 78  BILITOT 1.4*  PROT 6.6  ALBUMIN 3.7   Cardiac Enzymes: Recent Labs  Lab 08/23/18 1202 08/27/18 0608  CKTOTAL  --  79  TROPONINI <0.03  --    Urinalysis    Component Value Date/Time   COLORURINE YELLOW 08/23/2018 [place] 08/23/2018 1151   LABSPEC 1.012 08/23/2018 1151   PHURINE 7.0 08/23/2018 1151   GLUCOSEU NEGATIVE 08/23/2018 1151   HGBUR NEGATIVE 08/23/2018 1151   BILIRUBINUR NEGATIVE 08/23/2018 1151   KETONESUR NEGATIVE 08/23/2018 1151   PROTEINUR NEGATIVE 08/23/2018 1151   UROBILINOGEN 1.0 09/21/2014 1610   NITRITE NEGATIVE 08/23/2018 1151   LEUKOCYTESUR NEGATIVE 08/23/2018 1151    I discussed in detail with patient's daughter via phone, updated care and answered questions.  Time coordinating discharge: 50 minutes  SIGNED:  Vernell Leep, MD, FACP, [facility]. [facility]  To contact the attending provider between 7A-7P or the covering provider during after hours 7P-7A, please log into the web site [email or web address] and access using universal [place] password for that web site. If you do not have the password, please call the hospital operator.

## 2018-08-27 NOTE — H&P (Signed)
History and Physical    Cody Mendoza OAC:[id number] DOB: 12/01/1921 DOA: 08/27/2018  PCP: Crist Infante, MD  Patient coming from: Home  I have personally briefly reviewed patient's old medical records in [place]  Chief Complaint: Code stroke  HPI: Cody Mendoza is a 85 y.o. male with medical history significant of A. fib on Pradaxa, HTN, mitral valve insufficiency, GERD, hard of hearing, lives at home with his wife.  Patient recently admitted from 6/18 to earlier today (6/22) with TIA symptoms.  CXR chest showed possible TAAA so CTA chest was performed.  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  Patient discharged on empiric rocephin / Dapto to complete 4 weeks of therapy.  Today after being discharged, patient had syncopal episode at home.  Reportedly leaning on counter at home.  911 called.  Initial BPs in 90s apparently.  EMS noted L sided weakness and L facial droop which was present on arrival to ED.   ED Course: L sided weakness has resolved.  BPs now running consistently high ~200/100.  CT and CTA head/neck are negative for acute findings.   Review of Systems: As per HPI otherwise 10 point review of systems negative.   Past Medical History:  Diagnosis Date   AF (atrial fibrillation) (HCC)    Asthma    Detached retina    Discitis of thoracic region 08/2018   Diverticulosis    GERD (gastroesophageal reflux disease)    Cody Mendoza syndrome    Hypertension    Lactose intolerance    Mitral insufficiency    Renal calculi     Past Surgical History:  Procedure Laterality Date   cataract surgery     ESOPHAGEAL DILATION     In the past   [place]     Status post syrgical removal of a    TONSILLECTOMY       reports that he has never smoked. He has never used smokeless tobacco. He reports current alcohol  use. He reports that he does not use drugs.  No Known Allergies  Family History  Problem Relation Age of Onset   Hypertension Mother      Prior to Admission medications   Medication Sig Start Date End Date Taking? Authorizing Provider  carvedilol (COREG) 3.125 MG tablet Take 1 tablet (3.125 mg total) by mouth 2 (two) times daily with a meal. 08/27/18   Hongalgi, Lenis Dickinson, MD  cefTRIAXone (ROCEPHIN) IVPB Inject 2 g into the vein daily for 25 days. Indication:  Thoracic osteomyelitis and discitis Last Day of Therapy:  09/21/2018 Labs - Once weekly:  CBC/D and BMP, Labs - Every other week:  ESR and CRP 08/27/18 09/21/18  Hongalgi, Lenis Dickinson, MD  Cyanocobalamin (VITAMIN B12) 500 MCG TABS Take 500 mcg by mouth every morning.     [provider]  dabigatran (PRADAXA) 150 MG CAPS Take 1 capsule (150 mg total) by mouth every 12 (twelve) hours. 01/18/12   Martinique, Peter M, MD  daptomycin (CUBICIN) IVPB Inject 500 mg into the vein daily for 25 days. Indication:  Thoracic osteomyelitis and discitis  Last Day of Therapy:  09/21/2018 Labs - Once weekly:  CBC/D, BMP, and CPK Labs - Every other week:  ESR and CRP 08/27/18 09/21/18  Hongalgi, Lenis Dickinson, MD  doxazosin (CARDURA) 4 MG tablet Take 0.5 tablets (2 mg  total) by mouth daily. 08/28/18   Hongalgi, Lenis Dickinson, MD  Lactobacillus (PROBIOTIC ACIDOPHILUS PO) Take 1 capsule by mouth daily with supper.     [provider]  latanoprost (XALATAN) 0.005 % ophthalmic solution Place 1 drop into both eyes at bedtime.  09/05/17   [provider]  losartan (COZAAR) 100 MG tablet Take 50 mg by mouth 2 (two) times daily.     [provider]  Multiple Vitamin (MULTIVITAMIN WITH MINERALS) TABS Take 1 tablet by mouth daily with supper.     [provider]  Multiple Vitamins-Minerals (ICAPS AREDS FORMULA PO) Take 1 capsule by mouth 2 (two) times daily with breakfast and lunch.     [provider]  pantoprazole (PROTONIX) 40 MG  tablet Take 40 mg by mouth every morning.     [provider]  Polyethyl Glycol-Propyl Glycol (SYSTANE) 0.4-0.3 % SOLN Place 1 drop into both eyes at bedtime.     [provider]    Physical Exam: Vitals:   08/27/18 2200 08/27/18 2210 08/27/18 2215 08/27/18 2220  BP: (!) 213/99 (!) 207/104 (!) 187/97 (!) 202/145  Pulse: 93 93 99 97  Resp: 18 (!) 21 19 (!) 21  Temp:      TempSrc:      SpO2: 96% 97% 98% 97%    Constitutional: NAD, calm, comfortable Eyes: PERRL, lids and conjunctivae normal ENMT: Mucous membranes are moist. Posterior pharynx clear of any exudate or lesions.Normal dentition. Hard of hearing. Neck: normal, supple, no masses, no thyromegaly Respiratory: clear to auscultation bilaterally, no wheezing, no crackles. Normal respiratory effort. No accessory muscle use.  Cardiovascular: Regular rate and rhythm, no murmurs / rubs / gallops. No extremity edema. 2+ pedal pulses. No carotid bruits.  Abdomen: no tenderness, no masses palpated. No hepatosplenomegaly. Bowel sounds positive.  Musculoskeletal: no clubbing / cyanosis. No joint deformity upper and lower extremities. Good ROM, no contractures. Normal muscle tone.  Skin: no rashes, lesions, ulcers. No induration Neurologic: CN 2-12 grossly intact. Sensation intact, DTR normal. Strength 5/5 in all 4.  Psychiatric: Normal judgment and insight. Alert and oriented x 3. Normal mood.    Labs on Admission: I have personally reviewed following labs and imaging studies  CBC: Recent Labs  Lab 08/23/18 1202 08/24/18 0908 08/25/18 0716 08/26/18 0545 08/27/18 0608 08/27/18 2115 08/27/18 2119  WBC 4.8 7.0 6.6 7.4 7.3 9.0  --   NEUTROABS 3.2  --   --   --   --  6.1  --   HGB 10.5* 10.4* 10.3* 10.4* 10.2* 11.1* 11.9*  HCT 31.2* 30.3* 29.7* 30.1* 29.8* 32.4* 35.0*  MCV 92.3 90.2 90.0 90.4 90.9 91.8  --   PLT 219 204 183 186 186 185  --    Basic Metabolic Panel: Recent Labs  Lab 08/23/18 1202 08/24/18 0908  08/27/18 0608 08/27/18 2115 08/27/18 2119  NA 128* 129* 127* 127* 126*  K 4.2 3.8 4.2 3.9 3.9  CL 95* 95* 95* 93* 93*  CO2 25 25 25 22   --   GLUCOSE 98 141* 100* 113* 110*  BUN 18 18 18 22 23   CREATININE 1.20 1.28* 1.25* 1.37* 1.30*  CALCIUM 9.1 9.1 8.9 9.0  --    GFR: Estimated Creatinine Clearance: 31.9 mL/min (A) (by C-G formula based on SCr of 1.3 mg/dL (H)). Liver Function Tests: Recent Labs  Lab 08/23/18 1202 08/27/18 2115  AST 30 35  ALT 19 24  ALKPHOS 78 84  BILITOT 1.4* 1.0  PROT  6.6 6.6  ALBUMIN 3.7 3.6   No results for input(s): LIPASE, AMYLASE in the last 168 hours. No results for input(s): AMMONIA in the last 168 hours. Coagulation Profile: Recent Labs  Lab 08/27/18 2115  INR 1.7*   Cardiac Enzymes: Recent Labs  Lab 08/23/18 1202 08/27/18 0608  CKTOTAL  --  79  TROPONINI <0.03  --    BNP (last 3 results) No results for input(s): PROBNP in the last 8760 hours. HbA1C: No results for input(s): HGBA1C in the last 72 hours. CBG: Recent Labs  Lab 08/27/18 2115  GLUCAP 107*   Lipid Profile: No results for input(s): CHOL, HDL, LDLCALC, TRIG, CHOLHDL, LDLDIRECT in the last 72 hours. Thyroid Function Tests: No results for input(s): TSH, T4TOTAL, FREET4, T3FREE, THYROIDAB in the last 72 hours. Anemia Panel: No results for input(s): VITAMINB12, FOLATE, FERRITIN, TIBC, IRON, RETICCTPCT in the last 72 hours. Urine analysis:    Component Value Date/Time   COLORURINE YELLOW 08/27/2018 2300   APPEARANCEUR CLEAR 08/27/2018 2300   LABSPEC 1.044 (H) 08/27/2018 2300   PHURINE 6.0 08/27/2018 2300   GLUCOSEU NEGATIVE 08/27/2018 2300   HGBUR SMALL (A) 08/27/2018 2300   BILIRUBINUR NEGATIVE 08/27/2018 2300   KETONESUR 5 (A) 08/27/2018 2300   PROTEINUR NEGATIVE 08/27/2018 2300   UROBILINOGEN 1.0 09/21/2014 1610   NITRITE NEGATIVE 08/27/2018 2300   LEUKOCYTESUR NEGATIVE 08/27/2018 2300    Radiological Exams on Admission: Ct Angio Head W Or Wo  Contrast  Result Date: 08/27/2018 CLINICAL DATA:  Initial evaluation for acute stroke, left facial droop. EXAM: CT ANGIOGRAPHY HEAD AND NECK TECHNIQUE: Multidetector CT imaging of the head and neck was performed using the standard protocol during bolus administration of intravenous contrast. Multiplanar CT image reconstructions and MIPs were obtained to evaluate the vascular anatomy. Carotid stenosis measurements (when applicable) are obtained utilizing NASCET criteria, using the distal internal carotid diameter as the denominator. CONTRAST:  11m OMNIPAQUE IOHEXOL 350 MG/ML SOLN COMPARISON:  Prior head CT from earlier same day. FINDINGS: CTA NECK FINDINGS Aortic arch: Visualized aortic arch normal in caliber with normal 3 vessel morphology. Moderate atherosclerotic change about the arch and origin of the great vessels without hemodynamically significant stenosis. Visualized subclavian arteries widely patent. Right carotid system: Right common carotid artery patent from its origin to the bifurcation without stenosis. Calcified plaque about the right bifurcation with relatively mild narrowing about the origin of the right ICA. No hemodynamically significant stenosis. Right ICA otherwise widely patent distally to the skull base. Left carotid system: Left common carotid artery patent from its origin to the bifurcation without stenosis. Mild calcified plaque about the origin of the left ICA without hemodynamically significant stenosis. Left ICA widely patent distally to the skull base without stenosis, dissection, or occlusion. Vertebral arteries: Both of the vertebral arteries arise from the subclavian arteries. Focal plaque at the origin of the right vertebral artery with approximate 50% stenosis. Vertebral arteries otherwise patent within the neck without stenosis, dissection, or occlusion. Skeleton: No acute osseous abnormality. No discrete lytic or blastic osseous lesions. Mild multilevel cervical spondylolysis,  most notable at C6-7. Other neck: No other acute soft tissue abnormality within the neck. Upper chest: Visualized upper chest demonstrates no acute finding. Review of the MIP images confirms the above findings CTA HEAD FINDINGS Anterior circulation: Petrous segments widely patent bilaterally. Scattered calcified plaque within the cavernous/supraclinoid ICAs with relatively mild diffuse narrowing. No high-grade stenosis. A1 segments widely patent. Left A1 hypoplastic. Normal anterior communicating artery complex. Anterior cerebral arteries  widely patent to their distal aspects. No M1 stenosis or occlusion. Normal MCA bifurcations. Distal MCA branches well perfused and symmetric. Posterior circulation: Vertebral arteries patent to the vertebrobasilar junction without flow-limiting stenosis. Posterior inferior cerebral arteries patent bilaterally. Basilar artery somewhat diminutive but patent to its distal aspect without stenosis. Superior cerebral arteries patent bilaterally. Fetal type origin of the PCAs bilaterally. PCAs well perfused to their distal aspects without stenosis. Venous sinuses: Grossly patent allowing for timing of the contrast bolus. Anatomic variants: Fetal type origin of the PCAs bilaterally with secondary overall diminutive vertebrobasilar system. Delayed phase: Not performed. Review of the MIP images confirms the above findings IMPRESSION: 1. Negative CTA for emergent large vessel occlusion. 2. Scattered calcified plaque about the origin of the ICAs as well as within the carotid siphons without hemodynamically significant stenosis. 3. 50% stenosis at the origin of the right vertebral artery. Vertebrobasilar system otherwise widely patent. 4. Overall diminutive vertebrobasilar system based on fetal type origin of the PCAs bilaterally. Electronically Signed   By: Jeannine Boga M.D.   On: 08/27/2018 22:12   Ct Angio Neck W Or Wo Contrast  Result Date: 08/27/2018 CLINICAL DATA:  Initial  evaluation for acute stroke, left facial droop. EXAM: CT ANGIOGRAPHY HEAD AND NECK TECHNIQUE: Multidetector CT imaging of the head and neck was performed using the standard protocol during bolus administration of intravenous contrast. Multiplanar CT image reconstructions and MIPs were obtained to evaluate the vascular anatomy. Carotid stenosis measurements (when applicable) are obtained utilizing NASCET criteria, using the distal internal carotid diameter as the denominator. CONTRAST:  53m OMNIPAQUE IOHEXOL 350 MG/ML SOLN COMPARISON:  Prior head CT from earlier same day. FINDINGS: CTA NECK FINDINGS Aortic arch: Visualized aortic arch normal in caliber with normal 3 vessel morphology. Moderate atherosclerotic change about the arch and origin of the great vessels without hemodynamically significant stenosis. Visualized subclavian arteries widely patent. Right carotid system: Right common carotid artery patent from its origin to the bifurcation without stenosis. Calcified plaque about the right bifurcation with relatively mild narrowing about the origin of the right ICA. No hemodynamically significant stenosis. Right ICA otherwise widely patent distally to the skull base. Left carotid system: Left common carotid artery patent from its origin to the bifurcation without stenosis. Mild calcified plaque about the origin of the left ICA without hemodynamically significant stenosis. Left ICA widely patent distally to the skull base without stenosis, dissection, or occlusion. Vertebral arteries: Both of the vertebral arteries arise from the subclavian arteries. Focal plaque at the origin of the right vertebral artery with approximate 50% stenosis. Vertebral arteries otherwise patent within the neck without stenosis, dissection, or occlusion. Skeleton: No acute osseous abnormality. No discrete lytic or blastic osseous lesions. Mild multilevel cervical spondylolysis, most notable at C6-7. Other neck: No other acute soft tissue  abnormality within the neck. Upper chest: Visualized upper chest demonstrates no acute finding. Review of the MIP images confirms the above findings CTA HEAD FINDINGS Anterior circulation: Petrous segments widely patent bilaterally. Scattered calcified plaque within the cavernous/supraclinoid ICAs with relatively mild diffuse narrowing. No high-grade stenosis. A1 segments widely patent. Left A1 hypoplastic. Normal anterior communicating artery complex. Anterior cerebral arteries widely patent to their distal aspects. No M1 stenosis or occlusion. Normal MCA bifurcations. Distal MCA branches well perfused and symmetric. Posterior circulation: Vertebral arteries patent to the vertebrobasilar junction without flow-limiting stenosis. Posterior inferior cerebral arteries patent bilaterally. Basilar artery somewhat diminutive but patent to its distal aspect without stenosis. Superior cerebral arteries patent bilaterally. Fetal  type origin of the PCAs bilaterally. PCAs well perfused to their distal aspects without stenosis. Venous sinuses: Grossly patent allowing for timing of the contrast bolus. Anatomic variants: Fetal type origin of the PCAs bilaterally with secondary overall diminutive vertebrobasilar system. Delayed phase: Not performed. Review of the MIP images confirms the above findings IMPRESSION: 1. Negative CTA for emergent large vessel occlusion. 2. Scattered calcified plaque about the origin of the ICAs as well as within the carotid siphons without hemodynamically significant stenosis. 3. 50% stenosis at the origin of the right vertebral artery. Vertebrobasilar system otherwise widely patent. 4. Overall diminutive vertebrobasilar system based on fetal type origin of the PCAs bilaterally. Electronically Signed   By: Jeannine Boga M.D.   On: 08/27/2018 22:12   Dg Chest Port 1 View  Result Date: 08/26/2018 CLINICAL DATA:  PICC placement EXAM: PORTABLE CHEST 1 VIEW COMPARISON:  08/23/2018 chest  radiograph. FINDINGS: Right PICC terminates in the middle third of the SVC. Stable cardiomediastinal silhouette with mild cardiomegaly and thickening of the posterior mediastinal stripe over the lower thoracic spine. No pneumothorax. No pleural effusion. Lungs appear clear, with no acute consolidative airspace disease and no pulmonary edema. IMPRESSION: 1. Right PICC terminates in the middle third of the SVC. 2. Stable thickening of the posterior mediastinal stripe over the lower thoracic spine compatible with known discitis osteomyelitis. 3. Stable mild cardiomegaly without pulmonary edema. No active pulmonary disease. Electronically Signed   By: Ilona Sorrel M.D.   On: 08/26/2018 11:03   Ct Head Code Stroke Wo Contrast  Result Date: 08/27/2018 CLINICAL DATA:  Code stroke. Initial evaluation for acute left facial droop. EXAM: CT HEAD WITHOUT CONTRAST TECHNIQUE: Contiguous axial images were obtained from the base of the skull through the vertex without intravenous contrast. COMPARISON:  Prior brain MRI from 08/23/2018. FINDINGS: Brain: Generalized age-related cerebral atrophy with advanced chronic microvascular ischemic disease. No acute intracranial hemorrhage. No acute large vessel territory infarct. No mass lesion, midline shift or mass effect. No hydrocephalus. No extra-axial fluid collection. Vascular: No hyperdense vessel. Scattered vascular calcifications noted within the carotid siphons. Skull: Scalp soft tissues and calvarium within normal limits. Sinuses/Orbits: Globes and orbital soft tissues demonstrate no acute findings. Scattered mucosal thickening within the maxillary sinuses bilaterally. Paranasal sinuses are otherwise clear. No mastoid effusion. Other: None. ASPECTS [facility] Stroke Program Early CT Score) - Ganglionic level infarction (caudate, lentiform nuclei, internal capsule, insula, M1-M3 cortex): 7 - Supraganglionic infarction (M4-M6 cortex): 3 Total score (0-10 with 10 being normal): 10  IMPRESSION: 1. No acute intracranial infarct or other abnormality. 2. ASPECTS is 10. 3. Generalized age-related cerebral atrophy with advanced chronic microvascular ischemic disease. These results were communicated to Dr. Leonel Ramsay at 9:42 pmon 6/22/2020by text page via the [facility] messaging system. Electronically Signed   By: Jeannine Boga M.D.   On: 08/27/2018 21:45    EKG: Independently reviewed.  Assessment/Plan Principal Problem:   TIA (transient ischemic attack) Active Problems:   Hypertension   Atrial fibrillation (HCC)   Discitis of thoracic region   CKD (chronic kidney disease) stage 2, GFR 60-89 ml/min    1. TIA / syncopal episode - 1. TIA pathway 2. MRI brain 3. Will hold off on repeating rest of work up he just had done in the past couple of days. 4. Neuro wants [place] to let BP ride high for the moment, treat if he gets above 220/120 5. Tele monitor 2. Discitis and osteomyelitis of thoracic region - 1. Continue rocephin and dapto IV  3. HTN - 1. Continue home BP meds 4. A.Fib - 1. Continue Coreg for now 2. Watch for bradycardia, especially with the syncope.  Apparently having "asymptomatic" episodes of slow ventricular response down into the 30s last admit. 3. Continue Pradaxa 5. CKD stage 2 - chronic and stable  DVT prophylaxis: Pradaxa Code Status: DNR Family Communication: No family in room Disposition Plan: Home after admit Consults called: Neuro Admission status: Place in 29   [place]an Ad[place], [place] Hospitalists  How to contact the [facility] Attending or Consulting provider [place] or covering provider during after hours [place], for this patient?  1. Check the care team in [facility] and look for a) attending/consulting TRH provider listed and b) the [facility] team listed 2. Log into [email or web address]  Amion Physician Scheduling and messaging for groups and whole hospitals  On call and physician scheduling software for group practices, residents, hospitalists and other  medical providers for call, clinic, rotation and shift schedules. OnCall [employer] is a hospital-wide system for scheduling doctors and paging doctors on call. EasyPlot is for scientific plotting and data analysis.  [email or web address]  and use [place]'s universal password to access. If you do not have the password, please contact the hospital operator.  3. Locate the [facility] provider you are looking for under [facility] and page to a number that you can be directly reached. 4. If you still have difficulty reaching the provider, please page the [facility] (Director on Call) for the Hospitalists listed on amion for assistance.  08/27/2018, 11:19 PM    ]

## 2018-08-27 NOTE — TOC Transition Note (Addendum)
Transition of Care [facility]) - CM/SW Discharge Note   Patient Details  Name: Cody Mendoza MRN: [id number] Date of Birth: 01/06/1922  Transition of Care [facility] CM/SW Contact:  Zenon Mayo, RN Phone Number: 08/27/2018, 4:30 PM   Clinical Narrative:    Patient is for dc today home with [facility] for iv abx, Carolynn Sayers is also following patient.  NCM was given permission by patient to speak with daughter and son,  NCM spoke with Loletha Carrow and [name].  NCM offered choice to [name] for [facility] services, he chose [place], referral given to [facility].  He states soc will be on Thursday , so they offered their private duty RN services to fill in when the [facility] RN would not be there and it would be 165/hr, but Vickie states they do not want to pay for a private RN, they would just want the [facility] and help out themselves, so Vickie will need to get teaching done today at the hospital with [facility] before patient is discharged.  Vickie will be responsible for giving the iv abx Tues, Wed, and Alvis Lemmings will pick up on Thursday and coodinate the times from there with Loletha Carrow and [place].  NCM awaiting to hear back from [facility] regarding teaching. Pam states daughter Loletha Carrow is on her way to hospital for teaching, after teaching patient should be ready to discharge.    Final next level of care: [place] Barriers to Discharge: No Barriers Identified   Patient Goals and CMS Choice Patient states their goals for this hospitalization and ongoing recovery are:: to get better CMS Medicare.gov Compare Post Acute Care list provided to:: Patient Represenative (must comment) Choice offered to / list presented to : Adult Children  Discharge Placement                       Discharge Plan and Services                DME Arranged: (NA)         HH Arranged: RN HH Agency: [place] Date [facility] Agency Contacted: 08/27/18 Time [place]: 1629 Representative spoke with at [place]:  [place] ([place]) Interventions     Readmission Risk Interventions No flowsheet data found.

## 2018-08-27 NOTE — Discharge Instructions (Signed)

## 2018-08-27 NOTE — Progress Notes (Signed)
NURSING PROGRESS NOTE  DANISH RUFFINS [id number] Discharge Data: 08/27/2018 6:27 PM Attending Provider: Modena Jansky, MD [id number], Elta Guadel[place], MD     Gaston Islam to be D/C'd Home per MD order.  Discussed with the patient the After Visit Summary and all questions fully answered. All IV's discontinued with no bleeding noted. All belongings returned to patient for patient to take home.   Last Vital Signs:  Blood pressure (!) 209/92, pulse 75, temperature 97.7 F (36.5 C), temperature source Oral, resp. rate 18, height 5' 7"  (1.702 m), weight 64.9 kg, SpO2 97 %.  Discharge Medication List Allergies as of 08/27/2018   No Known Allergies     Medication List    TAKE these medications   carvedilol 3.125 MG tablet Commonly known as: COREG Take 1 tablet (3.125 mg total) by mouth 2 (two) times daily with a meal. Notes to patient: 08/28/2018   cefTRIAXone  IVPB Commonly known as: ROCEPHIN Inject 2 g into the vein daily for 25 days. Indication:  Thoracic osteomyelitis and discitis Last Day of Therapy:  09/21/2018 Labs - Once weekly:  CBC/D and BMP, Labs - Every other week:  ESR and CRP Notes to patient: 08/28/2018   dabigatran 150 MG Caps capsule Commonly known as: Pradaxa Take 1 capsule (150 mg total) by mouth every 12 (twelve) hours. Notes to patient: 08/27/2018   daptomycin  IVPB Commonly known as: CUBICIN Inject 500 mg into the vein daily for 25 days. Indication:  Thoracic osteomyelitis and discitis  Last Day of Therapy:  09/21/2018 Labs - Once weekly:  CBC/D, BMP, and CPK Labs - Every other week:  ESR and CRP Notes to patient: 08/28/2018   doxazosin 4 MG tablet Commonly known as: CARDURA Take 0.5 tablets (2 mg total) by mouth daily. Start taking on: August 28, 2018 What changed:   when to take this  additional instructions   ICAPS AREDS FORMULA PO Take 1 capsule by mouth 2 (two) times daily with breakfast and lunch. Notes to patient: 08/28/2018   latanoprost  0.005 % ophthalmic solution Commonly known as: XALATAN Place 1 drop into both eyes at bedtime. Notes to patient: 08/27/2018   losartan 100 MG tablet Commonly known as: COZAAR Take 50 mg by mouth 2 (two) times daily. Notes to patient: 08/27/2018   multivitamin with minerals Tabs tablet Take 1 tablet by mouth daily with supper. Notes to patient: 08/28/2018   pantoprazole 40 MG tablet Commonly known as: PROTONIX Take 40 mg by mouth every morning. Notes to patient: 08/28/2018   PROBIOTIC ACIDOPHILUS PO Take 1 capsule by mouth daily with supper. Notes to patient: 08/28/2018   Systane 0.4-0.3 % Soln Generic drug: Polyethyl Glycol-Propyl Glycol Place 1 drop into both eyes at bedtime. Notes to patient: 08/27/2018   Vitamin B12 500 MCG Tabs Take 500 mcg by mouth every morning. Notes to patient: 08/28/2018            Home Infusion Instuctions  (From admission, onward)         Start     Ordered   08/27/18 0000  Home infusion instructions [facility] May follow [place] Dosing Protocol; May administer Cathflo as needed to maintain patency of vascular access device.; Flushing of vascular access device: per [facility] Protocol: 0.9% NaCl pre/post medica...    Question Answer Comment  Instructions May follow [place] Dosing Protocol   Instructions May administer Cathflo as needed to maintain patency of vascular access device.   Instructions Flushing of vascular  access device: per [facility] Protocol: 0.9% NaCl pre/post medication administration and prn patency; Heparin 100 u/ml, 67m for implanted ports and Heparin 10u/ml, 513mfor all other central venous catheters.   Instructions May follow [facility] Anaphylaxis Protocol for First Dose Administration in the home: 0.9% NaCl at 25-50 ml/hr to maintain IV access for protocol meds. Epinephrine 0.3 ml IV/IM PRN and Benadryl 25-50 IV/IM PRN s/s of anaphylaxis.   Instructions [facility] Infusion Coordinator (RN) to assist per patient IV care  needs in the home PRN.      08/27/18 1355

## 2018-08-27 NOTE — Progress Notes (Addendum)
Electrophysiology Rounding Note  Patient Name: Cody Mendoza Date of Encounter: 08/27/2018  Electrophysiologist: Cristopher Peru, MD   Subjective   Feeling OK this am. Denies SOB or palpitations.   Inpatient Medications    Scheduled Meds: . carvedilol  3.125 mg Oral BID WC  . dabigatran  150 mg Oral Q12H  . doxazosin  2 mg Oral Daily  . latanoprost  1 drop Both Eyes QHS  . losartan  50 mg Oral QHS  . pantoprazole  40 mg Oral q morning - 10a  . polyvinyl alcohol  1 drop Both Eyes QHS  . vitamin B-12  500 mcg Oral Daily   Continuous Infusions: . cefTRIAXone (ROCEPHIN)  IV 2 g (08/26/18 1748)  . vancomycin     PRN Meds: acetaminophen **OR** acetaminophen, hydrALAZINE, ondansetron **OR** ondansetron (ZOFRAN) IV, sodium chloride flush   Vital Signs    Vitals:   08/26/18 1935 08/26/18 2330 08/27/18 0502 08/27/18 0748  BP: (!) 148/57 (!) 136/48 (!) 150/68 (!) 202/109  Pulse: (!) 55 61 63 74  Resp: 18 18 18 18   Temp: 97.9 F (36.6 C) 97.6 F (36.4 C) 98.3 F (36.8 C) 97.9 F (36.6 C)  TempSrc: Oral Oral Oral Oral  SpO2: 98% 97% 94% 97%  Weight:      Height:        Intake/Output Summary (Last 24 hours) at 08/27/2018 0756 Last data filed at 08/27/2018 0748 Gross per 24 hour  Intake 240 ml  Output 1180 ml  Net -940 ml   Filed Weights   08/23/18 1143  Weight: 64.9 kg    Physical Exam    GEN- Elderly, alert and oriented x 3 today.   Head- normocephalic, atraumatic Eyes-  Sclera clear, conjunctiva pink Ears- hearing intact Oropharynx- clear Neck- supple Lungs- Normal work of breathing Heart- Regular rate and rhythm GI- soft, NT, ND, + BS Extremities- no clubbing, cyanosis, or edema Skin- no rash or lesion Psych- euthymic mood, full affect Neuro- strength and sensation are intact  Labs    CBC Recent Labs    08/26/18 0545 08/27/18 0608  WBC 7.4 7.3  HGB 10.4* 10.2*  HCT 30.1* 29.8*  MCV 90.4 90.9  PLT 186 409   Basic Metabolic Panel Recent  Labs    08/24/18 0908 08/27/18 0608  NA 129* 127*  K 3.8 4.2  CL 95* 95*  CO2 25 25  GLUCOSE 141* 100*  BUN 18 18  CREATININE 1.28* 1.25*  CALCIUM 9.1 8.9   Liver Function Tests No results for input(s): AST, ALT, ALKPHOS, BILITOT, PROT, ALBUMIN in the last 72 hours. No results for input(s): LIPASE, AMYLASE in the last 72 hours. Cardiac Enzymes No results for input(s): CKTOTAL, CKMB, CKMBINDEX, TROPONINI in the last 72 hours. BNP Invalid input(s): POCBNP D-Dimer No results for input(s): DDIMER in the last 72 hours. Hemoglobin A1C No results for input(s): HGBA1C in the last 72 hours. Fasting Lipid Panel No results for input(s): CHOL, HDL, LDLCALC, TRIG, CHOLHDL, LDLDIRECT in the last 72 hours. Thyroid Function Tests No results for input(s): TSH, T4TOTAL, T3FREE, THYROIDAB in the last 72 hours.  Invalid input(s): FREET3  Telemetry    Afib 60-70s this am. Two 2 second pauses overnight while asleep. (personally reviewed)  Radiology    Dg Chest Port 1 View  Result Date: 08/26/2018 CLINICAL DATA:  PICC placement EXAM: PORTABLE CHEST 1 VIEW COMPARISON:  08/23/2018 chest radiograph. FINDINGS: Right PICC terminates in the middle third of the SVC. Stable cardiomediastinal silhouette  with mild cardiomegaly and thickening of the posterior mediastinal stripe over the lower thoracic spine. No pneumothorax. No pleural effusion. Lungs appear clear, with no acute consolidative airspace disease and no pulmonary edema. IMPRESSION: 1. Right PICC terminates in the middle third of the SVC. 2. Stable thickening of the posterior mediastinal stripe over the lower thoracic spine compatible with known discitis osteomyelitis. 3. Stable mild cardiomegaly without pulmonary edema. No active pulmonary disease. Electronically Signed   By: Ilona Sorrel M.D.   On: 08/26/2018 11:03   [place] Ekg Site Rite  Result Date: 08/25/2018 If Site Rite image not attached, placement could not be confirmed due to current  cardiac rhythm.    Patient Profile     Cody Mendoza is a 85 y.o. male admitted for possible TIA and hypertensive urgency. Noted to have Afib with variable rate control  Assessment & Plan    1. TIAs - Will be placed back on Pradaza. +/- ASA per neuro/primary 2. NSVT - Asytmptomatic. Electrolytes OK. Watch.  3. Afib with RVR (intermittently controlled and slow rates) - started on low dose coreg this admit with RVR. With low rates, may require backing up pacing, but poor candidate currently with ? Thoracic osteomyelitis. 4. T8-T9 Thoracic ostemyelitis - By MRI. Asymptomatic. ID following. Bone biopsy pending. 5. HTN - Per primary/Neuro in setting of TIAs.   Keep follow up with Dr. [place].   For questions or updates, please contact [facility] Please consult [email or web address] for contact info under Cardiology/STEMI.  Signed, Shirley Friar, PA-C  08/27/2018, 7:56 AM    EP Attending  Patient seen and examined. Agree with above. The patient is rate controlled and had no recurrent VT. Continue low dose coreg. Followup with Dr. [place].

## 2018-08-27 NOTE — Progress Notes (Addendum)
PHARMACY CONSULT NOTE FOR:  OUTPATIENT  PARENTERAL ANTIBIOTIC THERAPY (OPAT)  Indication: Thoracic osteomyelitis and discitis   Regimen:Daptomycin 500 mg every 24 hours + Ceftriaxone 2 gm Q 24 hours  End date: 09/21/2018  IV antibiotic discharge orders are pended. To discharging provider:  please sign these orders via discharge navigator,  Select New Orders & click on the button choice - Manage This Unsigned Work.     Jimmy Footman, PharmD, BCPS, BCIDP Infectious Diseases Clinical Pharmacist Phone: [phone] Please check AMION for all [place] numbers 08/27/2018 11:58 AM    Spoke with Dr. Baxter Flattery and will treat for 4 weeks through 7/17

## 2018-08-27 NOTE — ED Provider Notes (Signed)
[facility] EMERGENCY DEPARTMENT Provider Note   CSN: [id number] Arrival date & time: 08/27/18  2113   LEVEL 5 CAVEAT - DIFFICULTY HEARING  History   Chief Complaint Chief Complaint  Patient presents with   Code Stroke    HPI Cody Mendoza is a 85 y.o. male.     HPI  85 year old male presents as a code stroke.  History is obtained from the daughter and nurse who spoke to EMS.  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  The patient was at home in the kitchen leaning on the counter with both arms and then seemed to slowly pass out.  No injuries and he was laid down.  911 was called.  Initial blood pressure was in the 90s which is quite low for the patient according to daughter.  The EMS staff noticed left-sided weakness which was present on arrival here.  Code stroke was called due to the sudden onset of the weakness.  Patient is alert and oriented though difficult hearing which limits the history.  Past Medical History:  Diagnosis Date   AF (atrial fibrillation) (HCC)    Asthma    Detached retina    Discitis of thoracic region 08/2018   Diverticulosis    GERD (gastroesophageal reflux disease)    Rosanna Randy syndrome    Hypertension    Lactose intolerance    Mitral insufficiency    Renal calculi     Patient Active Problem List   Diagnosis Date Noted   Discitis of thoracic region 08/23/2018   CKD (chronic kidney disease) stage 2, GFR 60-89 ml/min 08/23/2018   Chronic anemia 08/23/2018   Discitis 08/23/2018   Puncture wound of right lower leg without foreign body 10/08/2013   Mitral insufficiency 12/28/2010   Atrial fibrillation ([place]) 11/16/2010   Hypertension    GERD 04/22/2010   PERSONAL HX COLONIC POLYPS 04/22/2010    Past Surgical History:  Procedure Laterality Date   cataract surgery     ESOPHAGEAL DILATION     In the past   [place]     Status post syrgical removal of a    TONSILLECTOMY          Home Medications    Prior to Admission medications   Medication Sig Start Date End Date Taking? Authorizing Provider  carvedilol (COREG) 3.125 MG tablet Take 1 tablet (3.125 mg total) by mouth 2 (two) times daily with a meal. 08/27/18   Hongalgi, Lenis Dickinson, MD  cefTRIAXone (ROCEPHIN) IVPB Inject 2 g into the vein daily for 25 days. Indication:  Thoracic osteomyelitis and discitis Last Day of Therapy:  09/21/2018 Labs - Once weekly:  CBC/D and BMP, Labs - Every other week:  ESR and CRP 08/27/18 09/21/18  Hongalgi, Lenis Dickinson, MD  Cyanocobalamin (VITAMIN B12) 500 MCG TABS Take 500 mcg by mouth every morning.     [provider]  dabigatran (PRADAXA) 150 MG CAPS Take 1 capsule (150 mg total) by mouth every 12 (twelve) hours. 01/18/12   Martinique, Peter M, MD  daptomycin (CUBICIN) IVPB Inject 500 mg into the vein daily for 25 days. Indication:  Thoracic osteomyelitis and discitis  Last Day of Therapy:  09/21/2018 Labs - Once weekly:  CBC/D, BMP, and CPK Labs - Every other week:  ESR and CRP 08/27/18 09/21/18  Hongalgi, Lenis Dickinson, MD  doxazosin (CARDURA) 4  MG tablet Take 0.5 tablets (2 mg total) by mouth daily. 08/28/18   Hongalgi, Lenis Dickinson, MD  Lactobacillus (PROBIOTIC ACIDOPHILUS PO) Take 1 capsule by mouth daily with supper.     [provider]  latanoprost (XALATAN) 0.005 % ophthalmic solution Place 1 drop into both eyes at bedtime.  09/05/17   [provider]  losartan (COZAAR) 100 MG tablet Take 50 mg by mouth 2 (two) times daily.     [provider]  Multiple Vitamin (MULTIVITAMIN WITH MINERALS) TABS Take 1 tablet by mouth daily with supper.     [provider]  Multiple Vitamins-Minerals (ICAPS AREDS FORMULA PO) Take 1 capsule by mouth 2 (two) times daily with breakfast and lunch.     [provider]  pantoprazole (PROTONIX) 40 MG tablet Take 40 mg by  mouth every morning.     [provider]  Polyethyl Glycol-Propyl Glycol (SYSTANE) 0.4-0.3 % SOLN Place 1 drop into both eyes at bedtime.     [provider]    Family History Family History  Problem Relation Age of Onset   Hypertension Mother     Social History Social History   Tobacco Use   Smoking status: Never Smoker   Smokeless tobacco: Never Used  Substance Use Topics   Alcohol use: Yes    Alcohol/week: 0.0 standard drinks    Comment: rarely   Drug use: Never     Allergies   Patient has no known allergies.   Review of Systems Review of Systems  Unable to perform ROS: Other     Physical Exam Updated Vital Signs BP (!) 202/145    Pulse 97    Temp 97.9 F (36.6 C) (Oral)    Resp (!) 21    SpO2 97%   Physical Exam Vitals signs and nursing note reviewed.  Constitutional:      General: He is not in acute distress.    Appearance: He is well-developed. He is not ill-appearing or diaphoretic.  HENT:     Head: Normocephalic and atraumatic.     Right Ear: External ear normal.     Left Ear: External ear normal.     Nose: Nose normal.  Eyes:     General:        Right eye: No discharge.        Left eye: No discharge.     Extraocular Movements: Extraocular movements intact.     Pupils: Pupils are equal, round, and reactive to light.  Neck:     Musculoskeletal: Neck supple.  Cardiovascular:     Rate and Rhythm: Normal rate. Rhythm irregular.     Heart sounds: Normal heart sounds.  Pulmonary:     Effort: Pulmonary effort is normal.     Breath sounds: Normal breath sounds.  Abdominal:     Palpations: Abdomen is soft.     Tenderness: There is no abdominal tenderness.  Skin:    General: Skin is warm and dry.  Neurological:     Mental Status: He is alert and oriented to person, place, and time.     Comments: CN 3-12 grossly intact. 5/5 strength in all 4 extremities. Grossly normal sensation. Normal finger to nose.   Psychiatric:         Mood and Affect: Mood is not anxious.      ED Treatments / Results  Labs (all labs ordered are listed, but only abnormal results are displayed) Labs Reviewed  PROTIME-INR - Abnormal; Notable for the following  components:      Result Value   Prothrombin Time 19.7 (*)    INR 1.7 (*)    All other components within normal limits  APTT - Abnormal; Notable for the following components:   aPTT 40 (*)    All other components within normal limits  CBC - Abnormal; Notable for the following components:   RBC 3.53 (*)    Hemoglobin 11.1 (*)    HCT 32.4 (*)    All other components within normal limits  DIFFERENTIAL - Abnormal; Notable for the following components:   Monocytes Absolute 1.3 (*)    All other components within normal limits  COMPREHENSIVE METABOLIC PANEL - Abnormal; Notable for the following components:   Sodium 127 (*)    Chloride 93 (*)    Glucose, Bld 113 (*)    Creatinine, Ser 1.37 (*)    GFR calc non Af Amer 44 (*)    GFR calc Af Amer 51 (*)    All other components within normal limits  CBG MONITORING, ED - Abnormal; Notable for the following components:   Glucose-Capillary 107 (*)    All other components within normal limits  I-STAT CHEM 8, ED - Abnormal; Notable for the following components:   Sodium 126 (*)    Chloride 93 (*)    Creatinine, Ser 1.30 (*)    Glucose, Bld 110 (*)    Calcium, Ion 1.07 (*)    Hemoglobin 11.9 (*)    HCT 35.0 (*)    All other components within normal limits  SARS CORONAVIRUS 2 (HOSPITAL ORDER, [facility])  ETHANOL  RAPID URINE DRUG SCREEN, HOSP PERFORMED  URINALYSIS, ROUTINE W REFLEX MICROSCOPIC  I-STAT CHEM 8, ED    EKG EKG Interpretation  Date/Time:  Monday August 27 2018 21:50:20 EDT Ventricular Rate:  96 PR Interval:    QRS Duration: 80 QT Interval:  354 QTC Calculation: 448 R Axis:   -42 Text Interpretation:  Atrial fibrillation Inferior infarct, old Anteroseptal infarct, age indeterminate  rate is faster, otherwise similar to August 23 2018 Confirmed by Sherwood Gambler [phone]) on 08/27/2018 10:09:00 PM   Radiology Ct Angio Head W Or Wo Contrast  Result Date: 08/27/2018 CLINICAL DATA:  Initial evaluation for acute stroke, left facial droop. EXAM: CT ANGIOGRAPHY HEAD AND NECK TECHNIQUE: Multidetector CT imaging of the head and neck was performed using the standard protocol during bolus administration of intravenous contrast. Multiplanar CT image reconstructions and MIPs were obtained to evaluate the vascular anatomy. Carotid stenosis measurements (when applicable) are obtained utilizing NASCET criteria, using the distal internal carotid diameter as the denominator. CONTRAST:  41m OMNIPAQUE IOHEXOL 350 MG/ML SOLN COMPARISON:  Prior head CT from earlier same day. FINDINGS: CTA NECK FINDINGS Aortic arch: Visualized aortic arch normal in caliber with normal 3 vessel morphology. Moderate atherosclerotic change about the arch and origin of the great vessels without hemodynamically significant stenosis. Visualized subclavian arteries widely patent. Right carotid system: Right common carotid artery patent from its origin to the bifurcation without stenosis. Calcified plaque about the right bifurcation with relatively mild narrowing about the origin of the right ICA. No hemodynamically significant stenosis. Right ICA otherwise widely patent distally to the skull base. Left carotid system: Left common carotid artery patent from its origin to the bifurcation without stenosis. Mild calcified plaque about the origin of the left ICA without hemodynamically significant stenosis. Left ICA widely patent distally to the skull base without stenosis, dissection, or occlusion. Vertebral arteries: Both  of the vertebral arteries arise from the subclavian arteries. Focal plaque at the origin of the right vertebral artery with approximate 50% stenosis. Vertebral arteries otherwise patent within the neck without stenosis,  dissection, or occlusion. Skeleton: No acute osseous abnormality. No discrete lytic or blastic osseous lesions. Mild multilevel cervical spondylolysis, most notable at C6-7. Other neck: No other acute soft tissue abnormality within the neck. Upper chest: Visualized upper chest demonstrates no acute finding. Review of the MIP images confirms the above findings CTA HEAD FINDINGS Anterior circulation: Petrous segments widely patent bilaterally. Scattered calcified plaque within the cavernous/supraclinoid ICAs with relatively mild diffuse narrowing. No high-grade stenosis. A1 segments widely patent. Left A1 hypoplastic. Normal anterior communicating artery complex. Anterior cerebral arteries widely patent to their distal aspects. No M1 stenosis or occlusion. Normal MCA bifurcations. Distal MCA branches well perfused and symmetric. Posterior circulation: Vertebral arteries patent to the vertebrobasilar junction without flow-limiting stenosis. Posterior inferior cerebral arteries patent bilaterally. Basilar artery somewhat diminutive but patent to its distal aspect without stenosis. Superior cerebral arteries patent bilaterally. Fetal type origin of the PCAs bilaterally. PCAs well perfused to their distal aspects without stenosis. Venous sinuses: Grossly patent allowing for timing of the contrast bolus. Anatomic variants: Fetal type origin of the PCAs bilaterally with secondary overall diminutive vertebrobasilar system. Delayed phase: Not performed. Review of the MIP images confirms the above findings IMPRESSION: 1. Negative CTA for emergent large vessel occlusion. 2. Scattered calcified plaque about the origin of the ICAs as well as within the carotid siphons without hemodynamically significant stenosis. 3. 50% stenosis at the origin of the right vertebral artery. Vertebrobasilar system otherwise widely patent. 4. Overall diminutive vertebrobasilar system based on fetal type origin of the PCAs bilaterally. Electronically  Signed   By: Jeannine Boga M.D.   On: 08/27/2018 22:12   Ct Angio Neck W Or Wo Contrast  Result Date: 08/27/2018 CLINICAL DATA:  Initial evaluation for acute stroke, left facial droop. EXAM: CT ANGIOGRAPHY HEAD AND NECK TECHNIQUE: Multidetector CT imaging of the head and neck was performed using the standard protocol during bolus administration of intravenous contrast. Multiplanar CT image reconstructions and MIPs were obtained to evaluate the vascular anatomy. Carotid stenosis measurements (when applicable) are obtained utilizing NASCET criteria, using the distal internal carotid diameter as the denominator. CONTRAST:  59m OMNIPAQUE IOHEXOL 350 MG/ML SOLN COMPARISON:  Prior head CT from earlier same day. FINDINGS: CTA NECK FINDINGS Aortic arch: Visualized aortic arch normal in caliber with normal 3 vessel morphology. Moderate atherosclerotic change about the arch and origin of the great vessels without hemodynamically significant stenosis. Visualized subclavian arteries widely patent. Right carotid system: Right common carotid artery patent from its origin to the bifurcation without stenosis. Calcified plaque about the right bifurcation with relatively mild narrowing about the origin of the right ICA. No hemodynamically significant stenosis. Right ICA otherwise widely patent distally to the skull base. Left carotid system: Left common carotid artery patent from its origin to the bifurcation without stenosis. Mild calcified plaque about the origin of the left ICA without hemodynamically significant stenosis. Left ICA widely patent distally to the skull base without stenosis, dissection, or occlusion. Vertebral arteries: Both of the vertebral arteries arise from the subclavian arteries. Focal plaque at the origin of the right vertebral artery with approximate 50% stenosis. Vertebral arteries otherwise patent within the neck without stenosis, dissection, or occlusion. Skeleton: No acute osseous  abnormality. No discrete lytic or blastic osseous lesions. Mild multilevel cervical spondylolysis, most notable at C6-7. Other  neck: No other acute soft tissue abnormality within the neck. Upper chest: Visualized upper chest demonstrates no acute finding. Review of the MIP images confirms the above findings CTA HEAD FINDINGS Anterior circulation: Petrous segments widely patent bilaterally. Scattered calcified plaque within the cavernous/supraclinoid ICAs with relatively mild diffuse narrowing. No high-grade stenosis. A1 segments widely patent. Left A1 hypoplastic. Normal anterior communicating artery complex. Anterior cerebral arteries widely patent to their distal aspects. No M1 stenosis or occlusion. Normal MCA bifurcations. Distal MCA branches well perfused and symmetric. Posterior circulation: Vertebral arteries patent to the vertebrobasilar junction without flow-limiting stenosis. Posterior inferior cerebral arteries patent bilaterally. Basilar artery somewhat diminutive but patent to its distal aspect without stenosis. Superior cerebral arteries patent bilaterally. Fetal type origin of the PCAs bilaterally. PCAs well perfused to their distal aspects without stenosis. Venous sinuses: Grossly patent allowing for timing of the contrast bolus. Anatomic variants: Fetal type origin of the PCAs bilaterally with secondary overall diminutive vertebrobasilar system. Delayed phase: Not performed. Review of the MIP images confirms the above findings IMPRESSION: 1. Negative CTA for emergent large vessel occlusion. 2. Scattered calcified plaque about the origin of the ICAs as well as within the carotid siphons without hemodynamically significant stenosis. 3. 50% stenosis at the origin of the right vertebral artery. Vertebrobasilar system otherwise widely patent. 4. Overall diminutive vertebrobasilar system based on fetal type origin of the PCAs bilaterally. Electronically Signed   By: Jeannine Boga M.D.   On:  08/27/2018 22:12   Dg Chest Port 1 View  Result Date: 08/26/2018 CLINICAL DATA:  PICC placement EXAM: PORTABLE CHEST 1 VIEW COMPARISON:  08/23/2018 chest radiograph. FINDINGS: Right PICC terminates in the middle third of the SVC. Stable cardiomediastinal silhouette with mild cardiomegaly and thickening of the posterior mediastinal stripe over the lower thoracic spine. No pneumothorax. No pleural effusion. Lungs appear clear, with no acute consolidative airspace disease and no pulmonary edema. IMPRESSION: 1. Right PICC terminates in the middle third of the SVC. 2. Stable thickening of the posterior mediastinal stripe over the lower thoracic spine compatible with known discitis osteomyelitis. 3. Stable mild cardiomegaly without pulmonary edema. No active pulmonary disease. Electronically Signed   By: Ilona Sorrel M.D.   On: 08/26/2018 11:03   Ct Head Code Stroke Wo Contrast  Result Date: 08/27/2018 CLINICAL DATA:  Code stroke. Initial evaluation for acute left facial droop. EXAM: CT HEAD WITHOUT CONTRAST TECHNIQUE: Contiguous axial images were obtained from the base of the skull through the vertex without intravenous contrast. COMPARISON:  Prior brain MRI from 08/23/2018. FINDINGS: Brain: Generalized age-related cerebral atrophy with advanced chronic microvascular ischemic disease. No acute intracranial hemorrhage. No acute large vessel territory infarct. No mass lesion, midline shift or mass effect. No hydrocephalus. No extra-axial fluid collection. Vascular: No hyperdense vessel. Scattered vascular calcifications noted within the carotid siphons. Skull: Scalp soft tissues and calvarium within normal limits. Sinuses/Orbits: Globes and orbital soft tissues demonstrate no acute findings. Scattered mucosal thickening within the maxillary sinuses bilaterally. Paranasal sinuses are otherwise clear. No mastoid effusion. Other: None. ASPECTS [facility] Stroke Program Early CT Score) - Ganglionic level infarction  (caudate, lentiform nuclei, internal capsule, insula, M1-M3 cortex): 7 - Supraganglionic infarction (M4-M6 cortex): 3 Total score (0-10 with 10 being normal): 10 IMPRESSION: 1. No acute intracranial infarct or other abnormality. 2. ASPECTS is 10. 3. Generalized age-related cerebral atrophy with advanced chronic microvascular ischemic disease. These results were communicated to Dr. Leonel Ramsay at 9:42 pmon 6/22/2020by text page via the [facility] messaging system. Electronically Signed  By: Jeannine Boga M.D.   On: 08/27/2018 21:45    Procedures Procedures (including critical care time)  Medications Ordered in ED Medications  cefTRIAXone (ROCEPHIN) IVPB (has no administration in time range)  daptomycin (CUBICIN) IVPB (has no administration in time range)  losartan (COZAAR) tablet 50 mg (has no administration in time range)  multivitamin with minerals tablet 1 tablet (has no administration in time range)  pantoprazole (PROTONIX) EC tablet 40 mg (has no administration in time range)  Polyethyl Glycol-Propyl Glycol 0.4-0.3 % SOLN 1 drop (has no administration in time range)  carvedilol (COREG) tablet 3.125 mg (has no administration in time range)  dabigatran (PRADAXA) capsule 150 mg (has no administration in time range)  doxazosin (CARDURA) tablet 2 mg (has no administration in time range)  latanoprost (XALATAN) 0.005 % ophthalmic solution 1 drop (has no administration in time range)  iohexol (OMNIPAQUE) 350 MG/ML injection 75 mL (75 mLs Intravenous Contrast Given 08/27/18 2129)     Initial Impression / Assessment and Plan / ED Course  I have reviewed the triage vital signs and the nursing notes.  Pertinent labs & imaging results that were available during my care of the patient were reviewed by me and considered in my medical decision making (see chart for details).        Presentation consistent with TIA.  Unclear if this was truly from an ischemic event versus transiently low blood  pressure from syncope.  No acute neurologic intervention warranted at this time.  Discussed with daughter.  Dr. Leonel Ramsay is consulted and recommends hospitalist admit.  Dr. Alcario Drought to admit.   Final Clinical Impressions(s) / ED Diagnoses   Final diagnoses:  TIA (transient ischemic attack)    ED Discharge Orders    None       Sherwood Gambler, MD 08/27/18 2241

## 2018-08-27 NOTE — ED Notes (Signed)
Patient transported to MRI 

## 2018-08-27 NOTE — ED Triage Notes (Signed)
EMS reports that the pt was released earlier today for TIA, pt then developed left arm weakness and left facial droop. Pt can answer basic questions but is somewhat disoriented.

## 2018-08-28 ENCOUNTER — Encounter (HOSPITAL_COMMUNITY): Payer: Self-pay | Admitting: Internal Medicine

## 2018-08-28 DIAGNOSIS — I10 Essential (primary) hypertension: Secondary | ICD-10-CM | POA: Diagnosis not present

## 2018-08-28 DIAGNOSIS — Z9842 Cataract extraction status, left eye: Secondary | ICD-10-CM | POA: Diagnosis not present

## 2018-08-28 DIAGNOSIS — Z66 Do not resuscitate: Secondary | ICD-10-CM | POA: Diagnosis present

## 2018-08-28 DIAGNOSIS — J45909 Unspecified asthma, uncomplicated: Secondary | ICD-10-CM | POA: Diagnosis present

## 2018-08-28 DIAGNOSIS — K219 Gastro-esophageal reflux disease without esophagitis: Secondary | ICD-10-CM | POA: Diagnosis present

## 2018-08-28 DIAGNOSIS — N182 Chronic kidney disease, stage 2 (mild): Secondary | ICD-10-CM | POA: Diagnosis present

## 2018-08-28 DIAGNOSIS — Z87442 Personal history of urinary calculi: Secondary | ICD-10-CM | POA: Diagnosis not present

## 2018-08-28 DIAGNOSIS — R55 Syncope and collapse: Secondary | ICD-10-CM | POA: Diagnosis present

## 2018-08-28 DIAGNOSIS — H9190 Unspecified hearing loss, unspecified ear: Secondary | ICD-10-CM | POA: Diagnosis present

## 2018-08-28 DIAGNOSIS — E871 Hypo-osmolality and hyponatremia: Secondary | ICD-10-CM | POA: Diagnosis present

## 2018-08-28 DIAGNOSIS — I129 Hypertensive chronic kidney disease with stage 1 through stage 4 chronic kidney disease, or unspecified chronic kidney disease: Secondary | ICD-10-CM | POA: Diagnosis present

## 2018-08-28 DIAGNOSIS — I340 Nonrheumatic mitral (valve) insufficiency: Secondary | ICD-10-CM | POA: Diagnosis present

## 2018-08-28 DIAGNOSIS — Z9841 Cataract extraction status, right eye: Secondary | ICD-10-CM | POA: Diagnosis not present

## 2018-08-28 DIAGNOSIS — E739 Lactose intolerance, unspecified: Secondary | ICD-10-CM | POA: Diagnosis present

## 2018-08-28 DIAGNOSIS — Z8673 Personal history of transient ischemic attack (TIA), and cerebral infarction without residual deficits: Secondary | ICD-10-CM | POA: Diagnosis not present

## 2018-08-28 DIAGNOSIS — G459 Transient cerebral ischemic attack, unspecified: Secondary | ICD-10-CM | POA: Diagnosis present

## 2018-08-28 DIAGNOSIS — M4644 Discitis, unspecified, thoracic region: Secondary | ICD-10-CM | POA: Diagnosis present

## 2018-08-28 DIAGNOSIS — I4891 Unspecified atrial fibrillation: Secondary | ICD-10-CM | POA: Diagnosis present

## 2018-08-28 DIAGNOSIS — Z7901 Long term (current) use of anticoagulants: Secondary | ICD-10-CM | POA: Diagnosis not present

## 2018-08-28 DIAGNOSIS — Z79899 Other long term (current) drug therapy: Secondary | ICD-10-CM | POA: Diagnosis not present

## 2018-08-28 DIAGNOSIS — Z1159 Encounter for screening for other viral diseases: Secondary | ICD-10-CM | POA: Diagnosis not present

## 2018-08-28 DIAGNOSIS — Z8249 Family history of ischemic heart disease and other diseases of the circulatory system: Secondary | ICD-10-CM | POA: Diagnosis not present

## 2018-08-28 DIAGNOSIS — M4624 Osteomyelitis of vertebra, thoracic region: Secondary | ICD-10-CM | POA: Diagnosis present

## 2018-08-28 DIAGNOSIS — D649 Anemia, unspecified: Secondary | ICD-10-CM | POA: Diagnosis present

## 2018-08-28 LAB — HEMOGLOBIN A1C
Hgb A1c MFr Bld: 5 % (ref 4.8–5.6)
Mean Plasma Glucose: 96.8 mg/dL

## 2018-08-28 LAB — CULTURE, BLOOD (ROUTINE X 2)
Culture: NO GROWTH
Culture: NO GROWTH
Special Requests: ADEQUATE

## 2018-08-28 LAB — LIPID PANEL
Cholesterol: 137 mg/dL (ref 0–200)
HDL: 60 mg/dL (ref >40)
LDL Cholesterol: 70 mg/dL (ref 0–99)
Total CHOL/HDL Ratio: 2.3 RATIO
Triglycerides: 34 mg/dL (ref <150)
VLDL: 7 mg/dL (ref 0–40)

## 2018-08-28 LAB — SARS CORONAVIRUS 2 BY RT PCR (HOSPITAL ORDER, PERFORMED IN ~~LOC~~ HOSPITAL LAB): SARS Coronavirus 2: NEGATIVE

## 2018-08-28 MED ORDER — SODIUM CHLORIDE 0.9% FLUSH: 10.0000 mL | INTRAVENOUS | Status: DC | PRN

## 2018-08-28 NOTE — Evaluation (Signed)
Speech Language Pathology Evaluation Patient Details Name: BEAR OSTEN MRN: [id number] DOB: [birth year] Today's Date: 08/28/2018 Time: 0906-0920 SLP Time Calculation (min) (ACUTE ONLY): 14 min  Problem List:  Patient Active Problem List   Diagnosis Date Noted  . TIA (transient ischemic attack) 08/27/2018  . Discitis of thoracic region 08/23/2018  . CKD (chronic kidney disease) stage 2, GFR 60-89 ml/min 08/23/2018  . Chronic anemia 08/23/2018  . Discitis 08/23/2018  . Puncture wound of right lower leg without foreign body 10/08/2013  . Mitral insufficiency 12/28/2010  . Atrial fibrillation ([place]) 11/16/2010  . Hypertension   . GERD 04/22/2010  . PERSONAL HX COLONIC POLYPS 04/22/2010   Past Medical History:  Past Medical History:  Diagnosis Date  . AF (atrial fibrillation) ([place])   . Asthma   . Detached retina   . Discitis of thoracic region 08/2018  . Diverticulosis   . GERD (gastroesophageal reflux disease)   . [name] syndrome   . Hypertension   . Lactose intolerance   . Mitral insufficiency   . Renal calculi    Past Surgical History:  Past Surgical History:  Procedure Laterality Date  . cataract surgery    . ESOPHAGEAL DILATION     In the past  . FINGER TENDON REPAIR    . INGUINAL HERNIA REPAIR    . KIDNEY STONE SURGERY     Status post syrgical removal of a   . TONSILLECTOMY     HPI:  85 y.o. male with medical history significant of A. fib on Pradaxa, HTN, mitral valve insufficiency, GERD, hard of hearing, lives at home with his wife. atient recently admitted from 6/18 to 6/22 ith TIA symptoms.  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  Reportedly leaning on counter at home.  911 called.  Initial BPs in 90s apparently.  EMS noted L sided weakness and L facial droop which was present on arrival to ED.   Assessment / Plan /  Recommendation Clinical Impression  Pt presents with functional cognitive abilities. Pt is alert and oriented x 4 able to problem solve issues with hearing aides, demonstrates good selective attention and insight. Even with hearing aids, pt is very HOH. At itmes, it appears that pt's cognition is diminished but when information or instructions are repeated with increased volume, pt is able to comprehend and demonstrate functional cognitive abilities. ST to sign off at this time.     SLP Assessment  SLP Recommendation/Assessment: Patient does not need any further Speech Lanaguage Pathology Services SLP Visit Diagnosis: Cognitive communication deficit (R41.841)    Follow Up Recommendations  None    Frequency and Duration           SLP Evaluation Cognition  Overall Cognitive Status: Within Functional Limits for tasks assessed Arousal/Alertness: Awake/alert Orientation Level: Oriented X4       Comprehension  Auditory Comprehension Overall Auditory Comprehension: Appears within functional limits for tasks assessed Visual Recognition/Discrimination Discrimination: Within Function Limits Reading Comprehension Reading Status: Within funtional limits    Expression Expression Primary Mode of Expression: Verbal Verbal Expression Overall Verbal Expression: Appears within functional limits for tasks assessed Written Expression Dominant Hand: Right Written Expression: Not tested   Oral / Motor  Oral Motor/Sensory Function Overall Oral Motor/Sensory Function: Within functional limits Motor Speech Overall Motor Speech: Appears within functional limits for tasks assessed Respiration: Within functional limits Phonation: Normal Resonance: Within  functional limits Articulation: Within functional limitis Intelligibility: Intelligible Motor Planning: Witnin functional limits Motor Speech Errors: Not applicable   GO                    Britiney Blahnik 08/28/2018, 11:24 AM

## 2018-08-28 NOTE — Progress Notes (Addendum)
[facility] PROGRESS NOTE  Cody Mendoza JJK:[id number] DOB: Aug 26, 1921 DOA: 08/27/2018 PCP: Crist Infante, MD  Assessment/Plan: 1. TIA / syncopal episode -TIA pathway initiated. Complete work up 2 days ago. MRI neg for acute infarct. Patient home for "1 hour" before episode. BP and HR somewhat labile. 2 sec pause reported as well. Asymptomatic. Evaluated by neuro who opined symptoms greatly improved once his BP increased from 90 to 200 and this likely represents TIA and thus recommended letting BP run high. 1. Will hold off on repeating rest of work up he just had done in the past couple of days. 2. Neuro recommends letting BP ride high for the moment, treat if he gets above 220/120. Range has been 371-696 systolic.  2. Discitis and osteomyelitis of thoracic region - he is afebrile and non-toxic appearing 1. Continue rocephin and dapto IV 3. HTN - uncontrolled. Home meds include coreg, cardura, losartan 1. Continue home BP meds 4. A.Fib - tele reports 2 sec pause. Asymptomatic. Chart review indicates "asymptomatic" episodes of slow ventricuar response down to 30's last admission.  1. Continue Coreg for now 2. Continue Pradaxa 5. CKD stage 2 - chronic and stable 6. Hyponatremia. Mild. Hx of same. monitor   Code Status: dnr Family Communication: patient Disposition Plan: home when ready   Consultants:  neuro  Procedures:    Antibiotics:  Rocephin  dapto  HPI/Subjective: 85 yo discharged 6/22 and home for 1 hours when had near syncope spell. EMT reports SBP 90 upon arrival. Report of L facial droop. Code stroke called.   Objective: Vitals:   08/28/18 0700 08/28/18 1014  BP: 123/81 (!) 181/97  Pulse: 81   Resp: 19   Temp: 98.2 F (36.8 C)   SpO2: 98%     Intake/Output Summary (Last 24 hours) at 08/28/2018 1157 Last data filed at 08/28/2018 0315 Gross per 24 hour  Intake 210 ml  Output -  Net 210 ml   Filed Weights   08/28/18 0049  Weight: 67.4 kg     Exam:   General:  Awake alert very HOH, somewhat frail and pale appearing  Cardiovascular: irregularly irregular no mgr no LE edema  Respiratory: normal effort BS clear bilaterally no wheeze  Abdomen: non-distended non-tender +BS no guarding or rebounding  Musculoskeletal: joints without swelling/erythema    Data Reviewed: Basic Metabolic Panel: Recent Labs  Lab 08/23/18 1202 08/24/18 0908 08/27/18 0608 08/27/18 2115 08/27/18 2119  NA 128* 129* 127* 127* 126*  K 4.2 3.8 4.2 3.9 3.9  CL 95* 95* 95* 93* 93*  CO2 25 25 25 22   --   GLUCOSE 98 141* 100* 113* 110*  BUN 18 18 18 22 23   CREATININE 1.20 1.28* 1.25* 1.37* 1.30*  CALCIUM 9.1 9.1 8.9 9.0  --    Liver Function Tests: Recent Labs  Lab 08/23/18 1202 08/27/18 2115  AST 30 35  ALT 19 24  ALKPHOS 78 84  BILITOT 1.4* 1.0  PROT 6.6 6.6  ALBUMIN 3.7 3.6   No results for input(s): LIPASE, AMYLASE in the last 168 hours. No results for input(s): AMMONIA in the last 168 hours. CBC: Recent Labs  Lab 08/23/18 1202 08/24/18 0908 08/25/18 0716 08/26/18 0545 08/27/18 0608 08/27/18 2115 08/27/18 2119  WBC 4.8 7.0 6.6 7.4 7.3 9.0  --   NEUTROABS 3.2  --   --   --   --  6.1  --   HGB 10.5* 10.4* 10.3* 10.4* 10.2* 11.1* 11.9*  HCT 31.2* 30.3* 29.7*  30.1* 29.8* 32.4* 35.0*  MCV 92.3 90.2 90.0 90.4 90.9 91.8  --   PLT 219 204 183 186 186 185  --    Cardiac Enzymes: Recent Labs  Lab 08/23/18 1202 08/27/18 0608  CKTOTAL  --  79  TROPONINI <0.03  --    BNP (last 3 results) No results for input(s): BNP in the last 8760 hours.  ProBNP (last 3 results) No results for input(s): PROBNP in the last 8760 hours.  CBG: Recent Labs  Lab 08/27/18 2115  GLUCAP 107*    Recent Results (from the past 240 hour(s))  Culture, blood (routine x 2)     Status: None   Collection Time: 08/23/18  8:06 PM   Specimen: BLOOD  Result Value Ref Range Status   Specimen Description BLOOD RIGHT WRIST  Final   Special Requests    Final    BOTTLES DRAWN AEROBIC AND ANAEROBIC Blood Culture results may not be optimal due to an excessive volume of blood received in culture bottles   Culture   Final    NO GROWTH 5 DAYS Performed at [facility], [place] [address], [place], [place] [address]    Report Status 08/28/2018 FINAL  Final  Culture, blood (routine x 2)     Status: None   Collection Time: 08/23/18  8:11 PM   Specimen: BLOOD  Result Value Ref Range Status   Specimen Description BLOOD LEFT ANTECUBITAL  Final   Special Requests   Final    BOTTLES DRAWN AEROBIC AND ANAEROBIC Blood Culture adequate volume   Culture   Final    NO GROWTH 5 DAYS Performed at [facility], [place] [address], [place], [place] [address]    Report Status 08/28/2018 FINAL  Final  Novel Coronavirus,NAA,(SEND-OUT TO REF LAB - TAT 24-48 hrs); Hosp Order     Status: None   Collection Time: 08/23/18  9:06 PM   Specimen: Nasopharyngeal Swab; Respiratory  Result Value Ref Range Status   SARS-CoV-2, NAA NOT DETECTED NOT DETECTED Final    Comment: (NOTE) This test was developed and its performance characteristics determined by [employer]. This test has not been FDA cleared or approved. This test has been authorized by FDA under an Emergency Use Authorization (EUA). This test is only authorized for the duration of time the declaration that circumstances exist justifying the authorization of the emergency use of in vitro diagnostic tests for detection of SARS-CoV-2 virus and/or diagnosis of COVID-19 infection under section 564(b)(1) of the Act, 21 U.S.C. 623JSE-8(B)(1), unless the authorization is terminated or revoked sooner. When diagnostic testing is negative, the possibility of a false negative result should be considered in the context of a patient's recent exposures and the presence of clinical signs and symptoms consistent with COVID-19. An individual without symptoms of COVID-19 and who is not shedding SARS-CoV-2  virus would expect to have a negative (not detected) result in this assay. Performed  At: [facility] [address] [place], [place] [id number] Rush Farmer MD XT:[id number]    [place]  Final    Comment: Performed at [facility], [place] [address], [place], [place] [address]  SARS Coronavirus 2 (CEPHEID - Performed in [facility] lab), Hosp Order     Status: None   Collection Time: 08/27/18 11:24 PM   Specimen: Nasopharyngeal Swab  Result Value Ref Range Status   SARS Coronavirus 2 NEGATIVE NEGATIVE Final    Comment: (NOTE) If result is NEGATIVE SARS-CoV-2  target nucleic acids are NOT DETECTED. The SARS-CoV-2 RNA is generally detectable in upper and lower  respiratory specimens during the acute phase of infection. The lowest  concentration of SARS-CoV-2 viral copies this assay can detect is 250  copies / mL. A negative result does not preclude SARS-CoV-2 infection  and should not be used as the sole basis for treatment or other  patient management decisions.  A negative result may occur with  improper specimen collection / handling, submission of specimen other  than nasopharyngeal swab, presence of viral mutation(s) within the  areas targeted by this assay, and inadequate number of viral copies  (<250 copies / mL). A negative result must be combined with clinical  observations, patient history, and epidemiological information. If result is POSITIVE SARS-CoV-2 target nucleic acids are DETECTED. The SARS-CoV-2 RNA is generally detectable in upper and lower  respiratory specimens dur ing the acute phase of infection.  Positive  results are indicative of active infection with SARS-CoV-2.  Clinical  correlation with patient history and other diagnostic information is  necessary to determine patient infection status.  Positive results do  not rule out bacterial infection or co-infection with other viruses. If result is PRESUMPTIVE  POSTIVE SARS-CoV-2 nucleic acids MAY BE PRESENT.   A presumptive positive result was obtained on the submitted specimen  and confirmed on repeat testing.  While 2019 novel coronavirus  (SARS-CoV-2) nucleic acids may be present in the submitted sample  additional confirmatory testing may be necessary for epidemiological  and / or clinical management purposes  to differentiate between  SARS-CoV-2 and other Sarbecovirus currently known to infect humans.  If clinically indicated additional testing with an alternate test  methodology [phone]) is advised. The SARS-CoV-2 RNA is generally  detectable in upper and lower respiratory sp ecimens during the acute  phase of infection. The expected result is Negative. Fact Sheet for Patients:  StrictlyIdeas.no Fact Sheet for Healthcare Providers: BankingDealers.co.za This test is not yet approved or cleared by the [place] FDA and has been authorized for detection and/or diagnosis of SARS-CoV-2 by FDA under an Emergency Use Authorization (EUA).  This EUA will remain in effect (meaning this test can be used) for the duration of the COVID-19 declaration under Section 564(b)(1) of the Act, 21 U.S.C. section 360bbb-3(b)(1), unless the authorization is terminated or revoked sooner. Performed at [facility], [place] [address] [place], [place] [address]      Studies: Ct Angio Head W Or Wo Contrast  Result Date: 08/27/2018 CLINICAL DATA:  Initial evaluation for acute stroke, left facial droop. EXAM: CT ANGIOGRAPHY HEAD AND NECK TECHNIQUE: Multidetector CT imaging of the head and neck was performed using the standard protocol during bolus administration of intravenous contrast. Multiplanar CT image reconstructions and MIPs were obtained to evaluate the vascular anatomy. Carotid stenosis measurements (when applicable) are obtained utilizing NASCET criteria, using the distal internal carotid diameter as  the denominator. CONTRAST:  17mL OMNIPAQUE IOHEXOL 350 MG/ML SOLN COMPARISON:  Prior head CT from earlier same day. FINDINGS: CTA NECK FINDINGS Aortic arch: Visualized aortic arch normal in caliber with normal 3 vessel morphology. Moderate atherosclerotic change about the arch and origin of the great vessels without hemodynamically significant stenosis. Visualized subclavian arteries widely patent. Right carotid system: Right common carotid artery patent from its origin to the bifurcation without stenosis. Calcified plaque about the right bifurcation with relatively mild narrowing about the origin of the right ICA. No hemodynamically significant stenosis. Right ICA otherwise widely patent distally  to the skull base. Left carotid system: Left common carotid artery patent from its origin to the bifurcation without stenosis. Mild calcified plaque about the origin of the left ICA without hemodynamically significant stenosis. Left ICA widely patent distally to the skull base without stenosis, dissection, or occlusion. Vertebral arteries: Both of the vertebral arteries arise from the subclavian arteries. Focal plaque at the origin of the right vertebral artery with approximate 50% stenosis. Vertebral arteries otherwise patent within the neck without stenosis, dissection, or occlusion. Skeleton: No acute osseous abnormality. No discrete lytic or blastic osseous lesions. Mild multilevel cervical spondylolysis, most notable at C6-7. Other neck: No other acute soft tissue abnormality within the neck. Upper chest: Visualized upper chest demonstrates no acute finding. Review of the MIP images confirms the above findings CTA HEAD FINDINGS Anterior circulation: Petrous segments widely patent bilaterally. Scattered calcified plaque within the cavernous/supraclinoid ICAs with relatively mild diffuse narrowing. No high-grade stenosis. A1 segments widely patent. Left A1 hypoplastic. Normal anterior communicating artery complex.  Anterior cerebral arteries widely patent to their distal aspects. No M1 stenosis or occlusion. Normal MCA bifurcations. Distal MCA branches well perfused and symmetric. Posterior circulation: Vertebral arteries patent to the vertebrobasilar junction without flow-limiting stenosis. Posterior inferior cerebral arteries patent bilaterally. Basilar artery somewhat diminutive but patent to its distal aspect without stenosis. Superior cerebral arteries patent bilaterally. Fetal type origin of the PCAs bilaterally. PCAs well perfused to their distal aspects without stenosis. Venous sinuses: Grossly patent allowing for timing of the contrast bolus. Anatomic variants: Fetal type origin of the PCAs bilaterally with secondary overall diminutive vertebrobasilar system. Delayed phase: Not performed. Review of the MIP images confirms the above findings IMPRESSION: 1. Negative CTA for emergent large vessel occlusion. 2. Scattered calcified plaque about the origin of the ICAs as well as within the carotid siphons without hemodynamically significant stenosis. 3. 50% stenosis at the origin of the right vertebral artery. Vertebrobasilar system otherwise widely patent. 4. Overall diminutive vertebrobasilar system based on fetal type origin of the PCAs bilaterally. Electronically Signed   By: Jeannine Boga M.D.   On: 08/27/2018 22:12   Ct Angio Neck W Or Wo Contrast  Result Date: 08/27/2018 CLINICAL DATA:  Initial evaluation for acute stroke, left facial droop. EXAM: CT ANGIOGRAPHY HEAD AND NECK TECHNIQUE: Multidetector CT imaging of the head and neck was performed using the standard protocol during bolus administration of intravenous contrast. Multiplanar CT image reconstructions and MIPs were obtained to evaluate the vascular anatomy. Carotid stenosis measurements (when applicable) are obtained utilizing NASCET criteria, using the distal internal carotid diameter as the denominator. CONTRAST:  67mL OMNIPAQUE IOHEXOL 350  MG/ML SOLN COMPARISON:  Prior head CT from earlier same day. FINDINGS: CTA NECK FINDINGS Aortic arch: Visualized aortic arch normal in caliber with normal 3 vessel morphology. Moderate atherosclerotic change about the arch and origin of the great vessels without hemodynamically significant stenosis. Visualized subclavian arteries widely patent. Right carotid system: Right common carotid artery patent from its origin to the bifurcation without stenosis. Calcified plaque about the right bifurcation with relatively mild narrowing about the origin of the right ICA. No hemodynamically significant stenosis. Right ICA otherwise widely patent distally to the skull base. Left carotid system: Left common carotid artery patent from its origin to the bifurcation without stenosis. Mild calcified plaque about the origin of the left ICA without hemodynamically significant stenosis. Left ICA widely patent distally to the skull base without stenosis, dissection, or occlusion. Vertebral arteries: Both of the vertebral arteries arise from  the subclavian arteries. Focal plaque at the origin of the right vertebral artery with approximate 50% stenosis. Vertebral arteries otherwise patent within the neck without stenosis, dissection, or occlusion. Skeleton: No acute osseous abnormality. No discrete lytic or blastic osseous lesions. Mild multilevel cervical spondylolysis, most notable at C6-7. Other neck: No other acute soft tissue abnormality within the neck. Upper chest: Visualized upper chest demonstrates no acute finding. Review of the MIP images confirms the above findings CTA HEAD FINDINGS Anterior circulation: Petrous segments widely patent bilaterally. Scattered calcified plaque within the cavernous/supraclinoid ICAs with relatively mild diffuse narrowing. No high-grade stenosis. A1 segments widely patent. Left A1 hypoplastic. Normal anterior communicating artery complex. Anterior cerebral arteries widely patent to their distal  aspects. No M1 stenosis or occlusion. Normal MCA bifurcations. Distal MCA branches well perfused and symmetric. Posterior circulation: Vertebral arteries patent to the vertebrobasilar junction without flow-limiting stenosis. Posterior inferior cerebral arteries patent bilaterally. Basilar artery somewhat diminutive but patent to its distal aspect without stenosis. Superior cerebral arteries patent bilaterally. Fetal type origin of the PCAs bilaterally. PCAs well perfused to their distal aspects without stenosis. Venous sinuses: Grossly patent allowing for timing of the contrast bolus. Anatomic variants: Fetal type origin of the PCAs bilaterally with secondary overall diminutive vertebrobasilar system. Delayed phase: Not performed. Review of the MIP images confirms the above findings IMPRESSION: 1. Negative CTA for emergent large vessel occlusion. 2. Scattered calcified plaque about the origin of the ICAs as well as within the carotid siphons without hemodynamically significant stenosis. 3. 50% stenosis at the origin of the right vertebral artery. Vertebrobasilar system otherwise widely patent. 4. Overall diminutive vertebrobasilar system based on fetal type origin of the PCAs bilaterally. Electronically Signed   By: Jeannine Boga M.D.   On: 08/27/2018 22:12   Mr Brain Wo Contrast  Result Date: 08/28/2018 CLINICAL DATA:  TIA EXAM: MRI HEAD WITHOUT CONTRAST TECHNIQUE: Multiplanar, multiecho pulse sequences of the brain and surrounding structures were obtained without intravenous contrast. COMPARISON:  CTA head neck 08/27/2018 FINDINGS: BRAIN: There is no acute infarct, acute hemorrhage or extra-axial collection. The midline structures are normal. Diffuse confluent hyperintense T2-weighted signal within the periventricular, deep and juxtacortical white matter, most commonly due to chronic ischemic microangiopathy. Generalized advanced atrophy. Susceptibility-sensitive sequences show no chronic  microhemorrhage or superficial siderosis. No midline shift or other mass effect. VASCULAR: The major intracranial arterial and venous sinus flow voids are normal. SKULL AND UPPER CERVICAL SPINE: Calvarial bone marrow signal is normal. There is no skull base mass. Visualized upper cervical spine and soft tissues are normal. SINUSES/ORBITS: No fluid levels or advanced mucosal thickening. No mastoid or middle ear effusion. The orbits are normal. IMPRESSION: Atrophy and severe chronic ischemic microangiopathy without acute intracranial abnormality. Electronically Signed   By: Ulyses Jarred M.D.   On: 08/28/2018 00:15   Ct Head Code Stroke Wo Contrast  Result Date: 08/27/2018 CLINICAL DATA:  Code stroke. Initial evaluation for acute left facial droop. EXAM: CT HEAD WITHOUT CONTRAST TECHNIQUE: Contiguous axial images were obtained from the base of the skull through the vertex without intravenous contrast. COMPARISON:  Prior brain MRI from 08/23/2018. FINDINGS: Brain: Generalized age-related cerebral atrophy with advanced chronic microvascular ischemic disease. No acute intracranial hemorrhage. No acute large vessel territory infarct. No mass lesion, midline shift or mass effect. No hydrocephalus. No extra-axial fluid collection. Vascular: No hyperdense vessel. Scattered vascular calcifications noted within the carotid siphons. Skull: Scalp soft tissues and calvarium within normal limits. Sinuses/Orbits: Globes and orbital soft tissues  demonstrate no acute findings. Scattered mucosal thickening within the maxillary sinuses bilaterally. Paranasal sinuses are otherwise clear. No mastoid effusion. Other: None. ASPECTS [facility] Stroke Program Early CT Score) - Ganglionic level infarction (caudate, lentiform nuclei, internal capsule, insula, M1-M3 cortex): 7 - Supraganglionic infarction (M4-M6 cortex): 3 Total score (0-10 with 10 being normal): 10 IMPRESSION: 1. No acute intracranial infarct or other abnormality. 2. ASPECTS  is 10. 3. Generalized age-related cerebral atrophy with advanced chronic microvascular ischemic disease. These results were communicated to Dr. Leonel Ramsay at 9:42 pmon 6/22/2020by text page via the [facility] messaging system. Electronically Signed   By: Jeannine Boga M.D.   On: 08/27/2018 21:45    Scheduled Meds: . carvedilol  3.125 mg Oral BID WC  . dabigatran  150 mg Oral Q12H  . doxazosin  2 mg Oral Daily  . latanoprost  1 drop Both Eyes QHS  . multivitamin with minerals  1 tablet Oral Q supper  . pantoprazole  40 mg Oral q morning - 10a  . polyvinyl alcohol  1 drop Both Eyes QHS   Continuous Infusions: . cefTRIAXone (ROCEPHIN)  IV Stopped (08/28/18 0244)  . DAPTOmycin (CUBICIN)  IV Stopped (08/28/18 0151)    Principal Problem:   Near syncope Active Problems:   Hypertension   Atrial fibrillation (HCC)   Discitis of thoracic region   CKD (chronic kidney disease) stage 2, GFR 60-89 ml/min   Hyponatremia    Time spent: 45 minutes    [place] NP  [facility] If 7PM-7AM, please contact night-coverage at [email or web address], password [facility] 08/28/2018, 11:57 AM  LOS: 0 days

## 2018-08-28 NOTE — Consult Note (Signed)
Neurology Consultation Reason for Consult: Left-sided weakness Referring Physician: Tyron Russell  CC: Left-sided weakness  History is obtained from: EMS  HPI: Cody Mendoza is a 85 y.o. male with a history of atrial fibrillation on anticoagulation with Pradaxa who was initially being evaluated by EMS after having been called there by family for a syncopal event.  While they were on scene, he developed abrupt left-sided weakness and confusion.  It was due to this that a code stroke was activated on route.  He was continuing to improve on arrival, and continue to improve after being evaluated.  LKW: 8:50 PM tpa given?: no, anticoagulation    ROS: A 14 point ROS was performed and is negative except as noted in the HPI.  Past Medical History:  Diagnosis Date  . AF (atrial fibrillation) ([place])   . Asthma   . Detached retina   . Discitis of thoracic region 08/2018  . Diverticulosis   . GERD (gastroesophageal reflux disease)   . [name] syndrome   . Hypertension   . Lactose intolerance   . Mitral insufficiency   . Renal calculi      Family History  Problem Relation Age of Onset  . Hypertension Mother      Social History:  reports that he has never smoked. He has never used smokeless tobacco. He reports current alcohol use. He reports that he does not use drugs.   Exam: Current vital signs: BP (!) 211/113 (BP Location: Left Arm)   Pulse 99   Temp 97.6 F (36.4 C) (Oral)   Resp 18   Ht 5\' 7"  (1.702 m)   Wt 67.4 kg   SpO2 100%   BMI 23.27 kg/m  Vital signs in last 24 hours: Temp:  [97.6 F (36.4 C)-98.3 F (36.8 C)] 97.6 F (36.4 C) (06/23 0049) Pulse Rate:  [63-110] 99 (06/23 0049) Resp:  [16-27] 18 (06/23 0049) BP: (141-222)/(67-145) 211/113 (06/23 0049) SpO2:  [94 %-100 %] 100 % (06/23 0049) Weight:  [67.4 kg] 67.4 kg (06/23 0049)   Physical Exam  Constitutional: Appears well-developed and well-nourished.  Psych: Affect appropriate to situation Eyes:  No scleral injection HENT: No OP obstrucion Head: Normocephalic.  Cardiovascular: Normal rate and regular rhythm.  Respiratory: Effort normal, non-labored breathing GI: Soft.  No distension. There is no tenderness.  Skin: WDI  Neuro: Mental Status: Patient is awake, not oriented, he is very hard of hearing Cranial Nerves: II: Visual Fields are full.  His left pupil is large and irregular, but does have some reactivity, right pupil is smaller and round and reactive III,IV, VI: EOMI without ptosis or diploplia.  V: Facial sensation is symmetric to temperature VII: Facial movement initially with some left-sided weakness, but this improved VIII: hearing is intact to voice X: Uvula elevates symmetrically XI: Shoulder shrug is symmetric. XII: tongue is midline without atrophy or fasciculations.  Motor: Tone is normal. Bulk is normal.  On arrival, he had 4/5 strength of the left arm and leg, but this improved Sensory: Sensation is symmetric to light touch and temperature in the arms and legs. Cerebellar: No clear ataxia   I have reviewed labs in epic and the results pertinent to this consultation are: Sodium 127  I have reviewed the images obtained: CT head-negative, CTA-negative  Impression: 85 year old male with left-sided weakness as well as syncope.  His symptoms greatly improved after his blood pressure increased from the 90s to the 200s.  I suspect that this does represent TIA, and I  would favor observation.  Given that his symptoms improved as his blood pressure did, I would let him run high for the time being.  Recommendations: - HgbA1c, fasting lipid panel - MRI  of the brain without contrast - Frequent neuro checks - Echocardiogram - Prophylactic therapy-Antiplatelet med: Aspirin - dose 325mg  PO or 300mg  PR - Risk factor modification - Telemetry monitoring - PT consult, OT consult, Speech consult - Stroke team to follow    Roland Rack, MD [facility] [phone]  If 7pm- 7am, please page neurology on call as listed in [place].

## 2018-08-28 NOTE — Progress Notes (Signed)
ID PROGRESS NOTE  85yo M with presumed thoracic discitis, will be empirically treated with daptomycin plus ceftriaxone x 4 wk then convert to oral abtx of doxycycline plus repeat imaging.using 6/20 as day 1, through July 18th.  Please see emily sinclair note for pharmacy opat order  We will arrange follow up in the ID clinic in 4 wk  Jillana Selph B. [facility] [facility] [phone]

## 2018-08-28 NOTE — Progress Notes (Signed)
STROKE TEAM PROGRESS NOTE  HPI:( per Dr Leonel Ramsay ) Cody Mendoza is a 85 y.o. male with a history of atrial fibrillation on anticoagulation with Pradaxa who was initially being evaluated by EMS after having been called there by family for a syncopal event.  While they were on scene, he developed abrupt left-sided weakness and confusion.  It was due to this that a code stroke was activated on route. He was continuing to improve on arrival, and continue to improve after being evaluated. LKW: 8:50 PM tpa given?: no, anticoagulation  INTERVAL HISTORY I have personally reviewed history of presenting illness with the patient, electronic medical records and imaging films in PACS.  Patient does not remember the episode but apparently had a syncopal event and had transient left-sided weakness and some confusion which appears to have resolved.  He does have chronic A. fib and is on long-term anticoagulation with Pradaxa.  MRI scan of the brain does not show an acute stroke.  CT angiogram of the brain and neck did not show significant extracranial or intracranial large vessel stenosis.  Transthoracic echo was unremarkable.  Vitals:   08/28/18 0650 08/28/18 0700 08/28/18 1014 08/28/18 1224  BP: 131/78 123/81 (!) 181/97 137/73  Pulse: 77 81  72  Resp: 20 19  18   Temp: 98.5 F (36.9 C) 98.2 F (36.8 C)  98.1 F (36.7 C)  TempSrc: Oral Axillary  Axillary  SpO2: 94% 98%  99%  Weight:      Height:        CBC:  Recent Labs  Lab 08/23/18 1202  08/27/18 0608 08/27/18 2115 08/27/18 2119  WBC 4.8   < > 7.3 9.0  --   NEUTROABS 3.2  --   --  6.1  --   HGB 10.5*   < > 10.2* 11.1* 11.9*  HCT 31.2*   < > 29.8* 32.4* 35.0*  MCV 92.3   < > 90.9 91.8  --   PLT 219   < > 186 185  --    < > = values in this interval not displayed.    Basic Metabolic Panel:  Recent Labs  Lab 08/27/18 0608 08/27/18 2115 08/27/18 2119  NA 127* 127* 126*  K 4.2 3.9 3.9  CL 95* 93* 93*  CO2 25 22  --   GLUCOSE  100* 113* 110*  BUN 18 22 23   CREATININE 1.25* 1.37* 1.30*  CALCIUM 8.9 9.0  --    Lipid Panel:     Component Value Date/Time   CHOL 137 08/28/2018 1035   TRIG 34 08/28/2018 1035   HDL 60 08/28/2018 1035   CHOLHDL 2.3 08/28/2018 1035   VLDL 7 08/28/2018 1035   LDLCALC 70 08/28/2018 1035   HgbA1c:  Lab Results  Component Value Date   HGBA1C 5.0 08/28/2018   Urine Drug Screen:     Component Value Date/Time   LABOPIA NONE DETECTED 08/27/2018 2300   COCAINSCRNUR NONE DETECTED 08/27/2018 2300   LABBENZ NONE DETECTED 08/27/2018 2300   AMPHETMU NONE DETECTED 08/27/2018 2300   THCU NONE DETECTED 08/27/2018 2300   LABBARB NONE DETECTED 08/27/2018 2300    Alcohol Level     Component Value Date/Time   ETH <10 08/27/2018 2115    IMAGING Ct Angio Head W Or Wo Contrast  Result Date: 08/27/2018 CLINICAL DATA:  Initial evaluation for acute stroke, left facial droop. EXAM: CT ANGIOGRAPHY HEAD AND NECK TECHNIQUE: Multidetector CT imaging of the head and neck was performed using the  standard protocol during bolus administration of intravenous contrast. Multiplanar CT image reconstructions and MIPs were obtained to evaluate the vascular anatomy. Carotid stenosis measurements (when applicable) are obtained utilizing NASCET criteria, using the distal internal carotid diameter as the denominator. CONTRAST:  57mL OMNIPAQUE IOHEXOL 350 MG/ML SOLN COMPARISON:  Prior head CT from earlier same day. FINDINGS: CTA NECK FINDINGS Aortic arch: Visualized aortic arch normal in caliber with normal 3 vessel morphology. Moderate atherosclerotic change about the arch and origin of the great vessels without hemodynamically significant stenosis. Visualized subclavian arteries widely patent. Right carotid system: Right common carotid artery patent from its origin to the bifurcation without stenosis. Calcified plaque about the right bifurcation with relatively mild narrowing about the origin of the right ICA. No  hemodynamically significant stenosis. Right ICA otherwise widely patent distally to the skull base. Left carotid system: Left common carotid artery patent from its origin to the bifurcation without stenosis. Mild calcified plaque about the origin of the left ICA without hemodynamically significant stenosis. Left ICA widely patent distally to the skull base without stenosis, dissection, or occlusion. Vertebral arteries: Both of the vertebral arteries arise from the subclavian arteries. Focal plaque at the origin of the right vertebral artery with approximate 50% stenosis. Vertebral arteries otherwise patent within the neck without stenosis, dissection, or occlusion. Skeleton: No acute osseous abnormality. No discrete lytic or blastic osseous lesions. Mild multilevel cervical spondylolysis, most notable at C6-7. Other neck: No other acute soft tissue abnormality within the neck. Upper chest: Visualized upper chest demonstrates no acute finding. Review of the MIP images confirms the above findings CTA HEAD FINDINGS Anterior circulation: Petrous segments widely patent bilaterally. Scattered calcified plaque within the cavernous/supraclinoid ICAs with relatively mild diffuse narrowing. No high-grade stenosis. A1 segments widely patent. Left A1 hypoplastic. Normal anterior communicating artery complex. Anterior cerebral arteries widely patent to their distal aspects. No M1 stenosis or occlusion. Normal MCA bifurcations. Distal MCA branches well perfused and symmetric. Posterior circulation: Vertebral arteries patent to the vertebrobasilar junction without flow-limiting stenosis. Posterior inferior cerebral arteries patent bilaterally. Basilar artery somewhat diminutive but patent to its distal aspect without stenosis. Superior cerebral arteries patent bilaterally. Fetal type origin of the PCAs bilaterally. PCAs well perfused to their distal aspects without stenosis. Venous sinuses: Grossly patent allowing for timing of  the contrast bolus. Anatomic variants: Fetal type origin of the PCAs bilaterally with secondary overall diminutive vertebrobasilar system. Delayed phase: Not performed. Review of the MIP images confirms the above findings IMPRESSION: 1. Negative CTA for emergent large vessel occlusion. 2. Scattered calcified plaque about the origin of the ICAs as well as within the carotid siphons without hemodynamically significant stenosis. 3. 50% stenosis at the origin of the right vertebral artery. Vertebrobasilar system otherwise widely patent. 4. Overall diminutive vertebrobasilar system based on fetal type origin of the PCAs bilaterally. Electronically Signed   By: Jeannine Boga M.D.   On: 08/27/2018 22:12   Ct Angio Neck W Or Wo Contrast  Result Date: 08/27/2018 CLINICAL DATA:  Initial evaluation for acute stroke, left facial droop. EXAM: CT ANGIOGRAPHY HEAD AND NECK TECHNIQUE: Multidetector CT imaging of the head and neck was performed using the standard protocol during bolus administration of intravenous contrast. Multiplanar CT image reconstructions and MIPs were obtained to evaluate the vascular anatomy. Carotid stenosis measurements (when applicable) are obtained utilizing NASCET criteria, using the distal internal carotid diameter as the denominator. CONTRAST:  45mL OMNIPAQUE IOHEXOL 350 MG/ML SOLN COMPARISON:  Prior head CT from earlier same day.  FINDINGS: CTA NECK FINDINGS Aortic arch: Visualized aortic arch normal in caliber with normal 3 vessel morphology. Moderate atherosclerotic change about the arch and origin of the great vessels without hemodynamically significant stenosis. Visualized subclavian arteries widely patent. Right carotid system: Right common carotid artery patent from its origin to the bifurcation without stenosis. Calcified plaque about the right bifurcation with relatively mild narrowing about the origin of the right ICA. No hemodynamically significant stenosis. Right ICA otherwise  widely patent distally to the skull base. Left carotid system: Left common carotid artery patent from its origin to the bifurcation without stenosis. Mild calcified plaque about the origin of the left ICA without hemodynamically significant stenosis. Left ICA widely patent distally to the skull base without stenosis, dissection, or occlusion. Vertebral arteries: Both of the vertebral arteries arise from the subclavian arteries. Focal plaque at the origin of the right vertebral artery with approximate 50% stenosis. Vertebral arteries otherwise patent within the neck without stenosis, dissection, or occlusion. Skeleton: No acute osseous abnormality. No discrete lytic or blastic osseous lesions. Mild multilevel cervical spondylolysis, most notable at C6-7. Other neck: No other acute soft tissue abnormality within the neck. Upper chest: Visualized upper chest demonstrates no acute finding. Review of the MIP images confirms the above findings CTA HEAD FINDINGS Anterior circulation: Petrous segments widely patent bilaterally. Scattered calcified plaque within the cavernous/supraclinoid ICAs with relatively mild diffuse narrowing. No high-grade stenosis. A1 segments widely patent. Left A1 hypoplastic. Normal anterior communicating artery complex. Anterior cerebral arteries widely patent to their distal aspects. No M1 stenosis or occlusion. Normal MCA bifurcations. Distal MCA branches well perfused and symmetric. Posterior circulation: Vertebral arteries patent to the vertebrobasilar junction without flow-limiting stenosis. Posterior inferior cerebral arteries patent bilaterally. Basilar artery somewhat diminutive but patent to its distal aspect without stenosis. Superior cerebral arteries patent bilaterally. Fetal type origin of the PCAs bilaterally. PCAs well perfused to their distal aspects without stenosis. Venous sinuses: Grossly patent allowing for timing of the contrast bolus. Anatomic variants: Fetal type origin of  the PCAs bilaterally with secondary overall diminutive vertebrobasilar system. Delayed phase: Not performed. Review of the MIP images confirms the above findings IMPRESSION: 1. Negative CTA for emergent large vessel occlusion. 2. Scattered calcified plaque about the origin of the ICAs as well as within the carotid siphons without hemodynamically significant stenosis. 3. 50% stenosis at the origin of the right vertebral artery. Vertebrobasilar system otherwise widely patent. 4. Overall diminutive vertebrobasilar system based on fetal type origin of the PCAs bilaterally. Electronically Signed   By: Jeannine Boga M.D.   On: 08/27/2018 22:12   Mr Brain Wo Contrast  Result Date: 08/28/2018 CLINICAL DATA:  TIA EXAM: MRI HEAD WITHOUT CONTRAST TECHNIQUE: Multiplanar, multiecho pulse sequences of the brain and surrounding structures were obtained without intravenous contrast. COMPARISON:  CTA head neck 08/27/2018 FINDINGS: BRAIN: There is no acute infarct, acute hemorrhage or extra-axial collection. The midline structures are normal. Diffuse confluent hyperintense T2-weighted signal within the periventricular, deep and juxtacortical white matter, most commonly due to chronic ischemic microangiopathy. Generalized advanced atrophy. Susceptibility-sensitive sequences show no chronic microhemorrhage or superficial siderosis. No midline shift or other mass effect. VASCULAR: The major intracranial arterial and venous sinus flow voids are normal. SKULL AND UPPER CERVICAL SPINE: Calvarial bone marrow signal is normal. There is no skull base mass. Visualized upper cervical spine and soft tissues are normal. SINUSES/ORBITS: No fluid levels or advanced mucosal thickening. No mastoid or middle ear effusion. The orbits are normal. IMPRESSION: Atrophy and severe  chronic ischemic microangiopathy without acute intracranial abnormality. Electronically Signed   By: Ulyses Jarred M.D.   On: 08/28/2018 00:15   Ct Head Code Stroke  Wo Contrast  Result Date: 08/27/2018 CLINICAL DATA:  Code stroke. Initial evaluation for acute left facial droop. EXAM: CT HEAD WITHOUT CONTRAST TECHNIQUE: Contiguous axial images were obtained from the base of the skull through the vertex without intravenous contrast. COMPARISON:  Prior brain MRI from 08/23/2018. FINDINGS: Brain: Generalized age-related cerebral atrophy with advanced chronic microvascular ischemic disease. No acute intracranial hemorrhage. No acute large vessel territory infarct. No mass lesion, midline shift or mass effect. No hydrocephalus. No extra-axial fluid collection. Vascular: No hyperdense vessel. Scattered vascular calcifications noted within the carotid siphons. Skull: Scalp soft tissues and calvarium within normal limits. Sinuses/Orbits: Globes and orbital soft tissues demonstrate no acute findings. Scattered mucosal thickening within the maxillary sinuses bilaterally. Paranasal sinuses are otherwise clear. No mastoid effusion. Other: None. ASPECTS [facility] Stroke Program Early CT Score) - Ganglionic level infarction (caudate, lentiform nuclei, internal capsule, insula, M1-M3 cortex): 7 - Supraganglionic infarction (M4-M6 cortex): 3 Total score (0-10 with 10 being normal): 10 IMPRESSION: 1. No acute intracranial infarct or other abnormality. 2. ASPECTS is 10. 3. Generalized age-related cerebral atrophy with advanced chronic microvascular ischemic disease. These results were communicated to Dr. Leonel Ramsay at 9:42 pmon 6/22/2020by text page via the [facility] messaging system. Electronically Signed   By: Jeannine Boga M.D.   On: 08/27/2018 21:45    PHYSICAL EXAM Pleasant elderly Caucasian male sitting up comfortably in a bedside chair.  Not in distress.  He is extremely hard of hearing. . Afebrile. Head is nontraumatic. Neck is supple without bruit.    Cardiac exam no murmur or gallop. Lungs are clear to auscultation. Distal pulses are well felt. Neurological Exam ;  Awake   Alert oriented x 3. Normal speech and language.eye movements full without nystagmus.fundi were not visualized. Vision acuity and fields appear normal. Hearing is diminished bilaterally. Palatal movements are normal. Face symmetric. Tongue midline. Normal strength, tone, reflexes and coordination. Normal sensation. Gait deferred.  ASSESSMENT/PLAN Cody Mendoza is a 85 y.o. male with history of A. fib on Pradaxa who developed a syncopal event, then presenting with left-sided weakness and confusion.   R brain TIA  Code Stroke CT head No acute abnormality. Small vessel disease. Atrophy. ASPECTS 10.     CTA head & neck no LVO.  Scattered plaque in ICAs.  RVA 50%.  Diminutive VB system with B fetal PCAs  MRI no acute stroke.  Atrophy.  Small vessel disease.  2D Echo EF 60 to 65%.  LA moderately dilated.  RA mildly dilated.  No source of embolus.  LDL 70  HgbA1c 5.0  SCDs for VTE prophylaxis  Pradaxa (dabigatran) twice a day prior to admission, now on Pradaxa (dabigatran) twice a day.  Continue Pradaxa at discharge  Therapy recommendations:  HH OT, [place] PT  Disposition:  Return home Follow-up Stroke Clinic at [facility] in 4 weeks. Office will call with appointment date and time. Order placed.  Atrial Fibrillation  Home anticoagulation:  Pradaxa (dabigatran) twice a day continued in the hospital . Continue Pradaxa (dabigatran) twice a day at discharge   Hypertension . BP elevated on arrival, as high as 202/145 . BP stable today . BP goal normotensive  Hyperlipidemia  Home meds:  No statin  LDL 70, goal < 57  Given advanced age and just at goal, will not add statin  Other Stroke Risk Factors  Advanced age  ETOH use, advised to drink no more than 2 drink(s) a day  Other Active Problems  Discitis and osteomyelitis of thoracic region  CKD stage II  Hyponatremia  Hospital day # 0  I have personally obtained history,examined this patient,  reviewed notes, independently viewed imaging studies, participated in medical decision making and plan of care.ROS completed by me personally and pertinent positives fully documented  I have made any additions or clarifications directly to the above note.   He presented with a syncopal event with transient left-sided weakness and confusion which has resolved.  Likely right brain TIA from small vessel disease.  Continue Pradaxa for anticoagulation given history of A. fib and aggressive risk factor modification.  Follow-up as an outpatient in the stroke clinic in 6 weeks.  Stroke team will sign off.  Kindly call for questions.  Discussed with Dr. Eliseo Squires.  Greater than 50% time during this 25-minute visit was spent on counseling and coordination of care about TIA and stroke prevention and answering questions. Antony Contras, MD Medical Director [facility] Pager: [phone] 08/28/2018 3:08 PM   To contact Stroke Continuity provider, please refer to [email or web address] After hours, contact General Neurology

## 2018-08-28 NOTE — Progress Notes (Signed)
Telemetry called to inform RN of pt having 2.06sec pause. MD paged and notified; no new orders received; pt asymptomatic; in bed sleeping comfortably. Will continue to closely monitor. Delia Heady RN

## 2018-08-28 NOTE — Evaluation (Signed)
Physical Therapy Evaluation Patient Details Name: Cody Mendoza MRN: [id number] DOB: Feb 25, 1922 Today's Date: 08/28/2018   History of Present Illness  Pt is a 85 y.o. male with medical history significant of A. fib on Pradaxa, HTN, mitral valve insufficiency, GERD, hard of hearing, lives at home with his wife. atient recently admitted from 6/18 to 6/22 with TIA symptoms.  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  Reportedly leaning on counter at home.  911 called.  Initial BPs in 90s apparently.  EMS noted L sided weakness and L facial droop which was present on arrival to ED. MRI negative for acute changes  Clinical Impression  Pt admitted with above diagnosis. Pt currently with functional limitations due to the deficits listed below (see PT Problem List). At the time of PT eval pt was able to perform transfers and ambulation with gross min guard to min assist for balance support and safety. Pt is at a high risk for falls, and recommend HHPT follow-up to maximize functional independence and potential for return to PLOF. Pt will benefit from skilled PT to increase their independence and safety with mobility to allow discharge to the venue listed below.       Follow Up Recommendations Home health PT    Equipment Recommendations  None recommended by PT    Recommendations for Other Services       Precautions / Restrictions Precautions Precautions: Fall Restrictions Weight Bearing Restrictions: No      Mobility  Bed Mobility Overal bed mobility: Needs Assistance Bed Mobility: Supine to Sit     Supine to sit: Min guard     General bed mobility comments: Close guard for safety as pt transitioned to EOB. HOB flat but pt utilized railing for support.  Transfers Overall transfer level: Needs assistance Equipment used: None Transfers: Sit to/from  Stand Sit to Stand: Min guard         General transfer comment: Pt demonstrated proper hand placement on seated surface for safety. No overt LOB however increased time required.   Ambulation/Gait Ambulation/Gait assistance: Min guard;Min assist Gait Distance (Feet): 100 Feet Assistive device: IV Pole;Quad cane Gait Pattern/deviations: Decreased stride length;Shuffle;Trunk flexed;Narrow base of support Gait velocity: Decreased Gait velocity interpretation: <1.8 ft/sec, indicate of risk for recurrent falls General Gait Details: Slow, shuffling gait pattern. Pt typically uses a SPC for support - QC only available so pt used that. Improved balance overall but still required hands-on guarding. Occasional assist provided for mild LOB.   Stairs            Wheelchair Mobility    Modified Rankin (Stroke Patients Only) Modified Rankin (Stroke Patients Only) Pre-Morbid Rankin Score: Slight disability Modified Rankin: Moderately severe disability     Balance Overall balance assessment: Needs assistance Sitting-balance support: Feet supported Sitting balance-Leahy Scale: Poor Sitting balance - Comments: Posterior lean with LE MMT   Standing balance support: Single extremity supported Standing balance-Leahy Scale: Poor Standing balance comment: Reliant on at least 1 UE support                             Pertinent Vitals/Pain Pain Assessment: No/denies pain    Home Living Family/patient expects to be discharged to:: Private residence Living Arrangements: Spouse/significant other Available Help at Discharge: Family Type of Home: House Home Access: Stairs to enter Entrance  Stairs-Rails: Right Entrance Stairs-Number of Steps: 1 Home Layout: One level Home Equipment: Hand held shower head;Shower seat - built in      Prior Function Level of Independence: Independent               Hand Dominance   Dominant Hand: Right    Extremity/Trunk Assessment    Upper Extremity Assessment Upper Extremity Assessment: Defer to OT evaluation    Lower Extremity Assessment Lower Extremity Assessment: Overall WFL for tasks assessed;LLE deficits/detail LLE Deficits / Details: Age appropriate bilaterally - very slight decrease in strength in LLE but still functional LLE Sensation: WNL LLE Coordination: decreased gross motor    Cervical / Trunk Assessment Cervical / Trunk Assessment: Kyphotic  Communication   Communication: HOH  Cognition Arousal/Alertness: Awake/alert Behavior During Therapy: WFL for tasks assessed/performed Overall Cognitive Status: Within Functional Limits for tasks assessed                                        General Comments      Exercises     Assessment/Plan    PT Assessment Patient needs continued PT services  PT Problem List Decreased strength;Decreased activity tolerance;Decreased balance;Decreased mobility;Decreased knowledge of use of DME;Decreased safety awareness;Decreased knowledge of precautions       PT Treatment Interventions DME instruction;Gait training;Functional mobility training;Therapeutic activities;Therapeutic exercise;Neuromuscular re-education;Patient/family education    PT Goals (Current goals can be found in the Care Plan section)  Acute Rehab PT Goals Patient Stated Goal: to go home to his wife PT Goal Formulation: With patient Time For Goal Achievement: 09/04/18 Potential to Achieve Goals: Good    Frequency Min 3X/week   Barriers to discharge        Co-evaluation               AM-PAC PT "6 Clicks" Mobility  Outcome Measure Help needed turning from your back to your side while in a flat bed without using bedrails?: None Help needed moving from lying on your back to sitting on the side of a flat bed without using bedrails?: None Help needed moving to and from a bed to a chair (including a wheelchair)?: A Little Help needed standing up from a chair using your  arms (e.g., wheelchair or bedside chair)?: A Little Help needed to walk in hospital room?: A Little Help needed climbing 3-5 steps with a railing? : A Little 6 Click Score: 20    End of Session Equipment Utilized During Treatment: Gait belt Activity Tolerance: Patient tolerated treatment well Patient left: in chair;with call bell/phone within reach;with chair alarm set Nurse Communication: Mobility status PT Visit Diagnosis: Unsteadiness on feet (R26.81);Other symptoms and signs involving the nervous system (R29.898)    Time: 7494-4967 PT Time Calculation (min) (ACUTE ONLY): 28 min   Charges:   PT Evaluation $PT Eval Moderate Complexity: 1 Mod PT Treatments $Gait Training: 8-22 mins        Rolinda Roan, PT, DPT Acute Rehabilitation Services Pager: [phone] Office: [phone]   Thelma Comp 08/28/2018, 1:25 PM

## 2018-08-28 NOTE — Evaluation (Signed)
Occupational Therapy Evaluation Patient Details Name: Cody Mendoza MRN: [id number] DOB: 03/10/1921 Today's Date: 08/28/2018    History of Present Illness 85 y.o. male with medical history significant of A. fib on Pradaxa, HTN, mitral valve insufficiency, GERD, hard of hearing, lives at home with his wife. atient recently admitted from 6/18 to 6/22 ith TIA symptoms.  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  Reportedly leaning on counter at home.  911 called.  Initial BPs in 90s apparently.  EMS noted L sided weakness and L facial droop which was present on arrival to ED.   Clinical Impression   Pt admitted with above and presents with deficits impacting ability to complete ADLs at [facility].  Pt currently requires min cues for sequencing ?due to [facility] vs cognitive impairments.  Pt completed ambulation and toilet transfers with CGA without AD and with intermittent cues for sequencing and problem solving.  CGA standing balance when standing at sink for grooming tasks.  Pt reports he and his wife "take care of each other".  Pt will benefit from OT acutely to decrease burden of care to allow for d/c home with [facility].      Follow Up Recommendations  Home health OT;Supervision/Assistance - 24 hour    Equipment Recommendations  None recommended by OT       Precautions / Restrictions Precautions Precautions: Fall      Mobility Bed Mobility Overal bed mobility: Needs Assistance Bed Mobility: Supine to Sit     Supine to sit: Min assist     General bed mobility comments: mod cues as pt attempting to reach towards rail on footboard prior to turning upper body  Transfers Overall transfer level: Needs assistance   Transfers: Sit to/from Stand;Stand Pivot Transfers Sit to Stand: Min guard Stand pivot transfers: Min guard                ADL either  performed or assessed with clinical judgement   ADL Overall ADL's : Needs assistance/impaired     Grooming: Min guard;Standing   Upper Body Bathing: Set up;Sitting   Lower Body Bathing: Min guard;Sit to/from stand   Upper Body Dressing : Set up;Sitting   Lower Body Dressing: Min guard;Sit to/from stand   Toilet Transfer: Min guard;Regular Toilet;Grab bars;Ambulation   Toileting- Clothing Manipulation and Hygiene: Min guard       Functional mobility during ADLs: Min guard;Cueing for sequencing General ADL Comments: Pt extremely HOH, increasing difficulty with following directions - however do not think it is cognitive in nature     Vision Baseline Vision/History: Wears glasses Wears Glasses: At all times Patient Visual Report: No change from baseline Vision Assessment?: No apparent visual deficits            Pertinent Vitals/Pain Pain Assessment: No/denies pain     Hand Dominance Right   Extremity/Trunk Assessment Upper Extremity Assessment Upper Extremity Assessment: Overall WFL for tasks assessed           Communication Communication Communication: HOH   Cognition Arousal/Alertness: Awake/alert Behavior During Therapy: WFL for tasks assessed/performed Overall Cognitive Status: Within Functional Limits for tasks assessed  Home Living Family/patient expects to be discharged to:: Private residence Living Arrangements: Spouse/significant other Available Help at Discharge: Family Type of Home: House Home Access: Stairs to enter Technical brewer of Steps: 1 Entrance Stairs-Rails: Right Home Layout: One level     Bathroom Shower/Tub: Occupational psychologist: Handicapped height Bathroom Accessibility: Yes How Accessible: Accessible via walker Home Equipment: Hand held shower head;Shower seat - built in          Prior Functioning/Environment Level of Independence:  Independent                 OT Problem List: Decreased activity tolerance;Impaired balance (sitting and/or standing)      OT Treatment/Interventions: Self-care/ADL training;Therapeutic activities;Balance training    OT Goals(Current goals can be found in the care plan section) Acute Rehab OT Goals Patient Stated Goal: to go home OT Goal Formulation: With patient Time For Goal Achievement: 09/11/18 Potential to Achieve Goals: Good  OT Frequency: Min 2X/week    AM-PAC OT "6 Clicks" Daily Activity     Outcome Measure Help from another person eating meals?: None Help from another person taking care of personal grooming?: A Little Help from another person toileting, which includes using toliet, bedpan, or urinal?: A Little Help from another person bathing (including washing, rinsing, drying)?: A Little Help from another person to put on and taking off regular upper body clothing?: None Help from another person to put on and taking off regular lower body clothing?: A Little 6 Click Score: 20   End of Session Equipment Utilized During Treatment: Gait belt Nurse Communication: Mobility status  Activity Tolerance: Patient tolerated treatment well;No increased pain Patient left: in bed;with call bell/phone within reach;with bed alarm set  OT Visit Diagnosis: Unsteadiness on feet (R26.81);History of falling (Z91.81)                Time: 8882-8003 OT Time Calculation (min): 28 min Charges:  OT General Charges $OT Visit: 1 Visit OT Evaluation $OT Eval Low Complexity: 1 Low OT Treatments $Self Care/Home Management : 8-22 mins   Simonne Come, [phone] 08/28/2018, 9:11 AM

## 2018-08-28 NOTE — Progress Notes (Signed)
Pt admitted to the unit from ED; pt alert and verbally responsive; pt oriented to the unit and room; fall/safety precaution and prevention education completed; BP elevated upon arrival to the unit; MD notified and asked to continue to monitor and BP goal less 220/120. Skin intact with no pressure ulcer or opened wound noted; Bed alarm on; call light within reach; will continue to closely monitor. Delia Heady RN   08/28/18 0049  Vitals  Temp 97.6 F (36.4 C)  Temp Source Oral  BP (!) 211/113  MAP (mmHg) 135  BP Location Left Arm  BP Method Automatic  Patient Position (if appropriate) Lying  Pulse Rate 99  Resp 18  Oxygen Therapy  SpO2 100 %  O2 Device Room Air  Pain Assessment  Pain Scale 0-10  Pain Score 0  Height and Weight  Height 5\' 7"  (1.702 m)  Weight 67.4 kg  BSA (Calculated - sq m) 1.78 sq meters  BMI (Calculated) 23.27  Weight in (lb) to have BMI = 25 159.3  MEWS Score  MEWS RR 0  MEWS Pulse 0  MEWS Systolic 2  MEWS LOC 0  MEWS Temp 0  MEWS Score 2  MEWS Score Color Yellow

## 2018-08-29 LAB — BASIC METABOLIC PANEL
Anion gap: 10 (ref 5–15)
BUN: 25 mg/dL — ABNORMAL HIGH (ref 8–23)
CO2: 23 mmol/L (ref 22–32)
Calcium: 8.5 mg/dL — ABNORMAL LOW (ref 8.9–10.3)
Chloride: 92 mmol/L — ABNORMAL LOW (ref 98–111)
Creatinine, Ser: 1.25 mg/dL — ABNORMAL HIGH (ref 0.61–1.24)
GFR calc Af Amer: 57 mL/min — ABNORMAL LOW (ref >60)
GFR calc non Af Amer: 49 mL/min — ABNORMAL LOW (ref >60)
Glucose, Bld: 107 mg/dL — ABNORMAL HIGH (ref 70–99)
Potassium: 3.5 mmol/L (ref 3.5–5.1)
Sodium: 125 mmol/L — ABNORMAL LOW (ref 135–145)

## 2018-08-29 MED ORDER — HYDRALAZINE HCL 20 MG/ML IJ SOLN
10.0000 mg | INTRAMUSCULAR | Status: DC | PRN
  Administered 2018-08-29: 10 mg via INTRAVENOUS
  Filled 2018-08-29: qty 1

## 2018-08-29 MED ORDER — SODIUM CHLORIDE 0.9 % IV SOLN
500.0000 mg | Freq: Every day | INTRAVENOUS | Status: DC
  Administered 2018-08-29: 500 mg via INTRAVENOUS
  Filled 2018-08-29: qty 10

## 2018-08-29 MED ORDER — SODIUM CHLORIDE 0.9 % IV SOLN
2.0000 g | INTRAVENOUS | Status: DC
  Administered 2018-08-29: 2 g via INTRAVENOUS
  Filled 2018-08-29: qty 2

## 2018-08-29 NOTE — Discharge Summary (Signed)
Physician Discharge Summary  Cody Mendoza:[id number] DOB: 01-Sep-1921 DOA: 08/27/2018  PCP: Crist Infante, MD  Admit date: 08/27/2018 Discharge date: 08/29/2018  Admitted From: home Disposition:  HHPT  Recommendations for Outpatient Follow-up:  1. Dr. Crist Infante, PCP in 1 week.  To be seen with repeat labs (CBC, BMP & CK) that will be drawn by home health services as per OPAT protocol. 2. Dr. Peter Martinique, Cardiology on 09/17/2018 at 11:40 AM. 3. Dr. Carlyle Basques ID: Office will arrange follow-up appointment.  Continue home health services  Home Health:YES  Equipment/Devices: PICC, IV ANTIBIOTICS  Discharge Condition: Stable CODE STATUS: dnr Diet recommendation: Cardiac diet  Brief/Interim Summary: 85 year old elderly male with history of A. fib on Pradaxa, hypertension, mitral valve insufficiency, GERD, hard of hearing who lives at home with his wife was admitted 6/18-6/22 with TIA symptoms and was discharged, but was admitted within few hours with ?syncope and left-sided weakness. Initial BPs in 90s apparently, EMS noted left-sided weakness and left facial droop so brought to the ER. In the ER blood pressure high in 200/100, no more left-sided weakness.  CT and CTA head and neck negative.  Patient admitted for further management.  Seen by neurology. He is suspected to have TIA.  Seen by neurology no further need.  Seen by PT OT and cleared for home discharge.  At this time patient is back to his baseline, alert awake oriented, will be discharged home after IV antibiotics infusion for today's dose.  Subjective: Resting comfortably on the bedside chair and having a meal.  No new complaints.  He wants to go home today.  Assessment & Plan:   Syncope/TIA symptoms : Admitted and completed stroke work-up, seen by neurology-suspecting right brain TIA.  CT and CTA head negative no acute finding. Recent MRI brain with no acute stroke, TTE unremarkable, LVEF 60 to 65%, no source of  embolus.  LDL 70, HbA1c 5.0.  Patient is to continue on Pradaxa, continue PT OT.  LDL at goal and no need for statin.  Hypertension labile: Uncontrolled in 200 on admission.  Apparently blood pressure in 90s at the time of syncope.  According to patient and patient's daughter his blood pressure has been very difficult control for many years and has been labile.   He is doxazosin, carvedilol and losartan. He will follow up w PCP for BP management  Atrial fibrillation with slow ventricular rate.  Pause less than 3 seconds.  Previous chart review indicates "asymptomatic" episodes of slow ventricular response down to 30s in last admission. On Pradaxa and Coreg lwo dose 3.125 mg. F/u with cardiology as scheduled.Patient was placed on low-dose Coreg by EP cardiology on last admission NSVT VS A Fib..  Discitis of thoracic region T8-T9, current osteomyelitis: On ceftriaxone and daptomycin from last admission-getting home health iv antibiotic w PICC line.  Seen by neurosurgery patient declined surgery.  Dr. Baxter Flattery, ID has placed him  on  IV Daptomycin and Ceftriaxone for a total of 4 week  END DATE 09/21/18  CKD stage 2, GFR 60-89 ml/min:Creatinine stable.  Hyponatremia chronic: Sodium level in 125-127.previous sodium level 124-130.Monitor.  Encourage oral intake.  CKD stage 2 - chronic and stable  DVT prophylaxis: pradaxa Code Status: DNR Family Communication: daughter udated.  I discussed the plan of care with patient daughter regarding disposition, at this time patient is wanting to return home.  Patient's daughter is agreeable plan of care to return home with home health PT.  Patient has home health  RN set up for IV infusion at home.  Patient will need to resume the instruction from previous discharge Disposition Plan: Seen by PT OT and for HHPT  Consultants:  Neurology Procedures:  Antimicrobials: Anti-infectives (From admission, onward)   Start     Dose/Rate Route Frequency Ordered Stop    08/29/18 1700  cefTRIAXone (ROCEPHIN) 2 g in sodium chloride 0.9 % 100 mL IVPB     2 g 200 mL/hr over 30 Minutes Intravenous Every 24 hours 08/29/18 1211     08/29/18 1700  DAPTOmycin (CUBICIN) 500 mg in sodium chloride 0.9 % IVPB     500 mg 220 mL/hr over 30 Minutes Intravenous Daily 08/29/18 1211     08/28/18 1000  cefTRIAXone (ROCEPHIN) IVPB  Status:  Discontinued    Note to Pharmacy: Indication:  Thoracic osteomyelitis and discitis Last Day of Therapy:  09/21/2018 Labs - Once weekly:  CBC/D and BMP, Labs - Every other week:  ESR and CRP     2 g Intravenous Every 24 hours 08/27/18 2234 08/27/18 2239   08/28/18 1000  daptomycin (CUBICIN) IVPB  Status:  Discontinued    Note to Pharmacy: Indication:  Thoracic osteomyelitis and discitis  Last Day of Therapy:  09/21/2018 Labs - Once weekly:  CBC/D, BMP, and CPK Labs - Every other week:  ESR and CRP     500 mg Intravenous Every 24 hours 08/27/18 2234 08/27/18 2240   08/27/18 2245  cefTRIAXone (ROCEPHIN) 2 g in sodium chloride 0.9 % 100 mL IVPB  Status:  Discontinued     2 g 200 mL/hr over 30 Minutes Intravenous Every 24 hours 08/27/18 2239 08/29/18 1211   08/27/18 2245  DAPTOmycin (CUBICIN) 500 mg in sodium chloride 0.9 % IVPB  Status:  Discontinued     500 mg 220 mL/hr over 30 Minutes Intravenous Daily 08/27/18 2240 08/29/18 1211        Discharge Diagnoses:  Principal Problem:   Near syncope Active Problems:   Hypertension   Atrial fibrillation (HCC)   Discitis of thoracic region   CKD (chronic kidney disease) stage 2, GFR 60-89 ml/min   TIA (transient ischemic attack)   Hyponatremia    Discharge Instructions  Discharge Instructions    Ambulatory referral to Neurology   Complete by: As directed    Follow up with stroke clinic NP (Jessica Vanschaick or Cecille Rubin, if both not available, consider Dr. Antony Contras, Dr. Bess Harvest, or Dr. Sarina Ill) at [facility] in about 4 weeks.   Diet - low  sodium heart healthy   Complete by: As directed    Discharge instructions   Complete by: As directed    Please call call MD or return to ER for similar recurring problem, nausea/vomiting, uncontrolled pain, abdominal pain chest pain, shortness of breath, fever.  Please follow-up your doctor as instructed in a week time and call the office for appointment. Monitor blood pressure at home and follow-up with your primary care doctor for the management. Please have weekly Labs drawn and sent to Dr. Baxter Flattery office. Follow-up with cardiology.  Please avoid alcohol, smoking, or any other illicit substance.   Increase activity slowly   Complete by: As directed      Allergies as of 08/29/2018   No Known Allergies     Medication List    TAKE these medications   carvedilol 3.125 MG tablet Commonly known as: COREG Take 1 tablet (3.125 mg total) by mouth 2 (two) times daily with a meal.  cefTRIAXone  IVPB Commonly known as: ROCEPHIN Inject 2 g into the vein daily for 25 days. Indication:  Thoracic osteomyelitis and discitis Last Day of Therapy:  09/21/2018 Labs - Once weekly:  CBC/D and BMP, Labs - Every other week:  ESR and CRP   dabigatran 150 MG Caps capsule Commonly known as: Pradaxa Take 1 capsule (150 mg total) by mouth every 12 (twelve) hours.   daptomycin  IVPB Commonly known as: CUBICIN Inject 500 mg into the vein daily for 25 days. Indication:  Thoracic osteomyelitis and discitis  Last Day of Therapy:  09/21/2018 Labs - Once weekly:  CBC/D, BMP, and CPK Labs - Every other week:  ESR and CRP   doxazosin 4 MG tablet Commonly known as: CARDURA Take 0.5 tablets (2 mg total) by mouth daily.   ICAPS AREDS FORMULA PO Take 1 capsule by mouth 2 (two) times daily with breakfast and lunch.   latanoprost 0.005 % ophthalmic solution Commonly known as: XALATAN Place 1 drop into both eyes at bedtime.   losartan 100 MG tablet Commonly known as: COZAAR Take 50 mg by mouth 2 (two)  times daily.   multivitamin with minerals Tabs tablet Take 1 tablet by mouth daily with supper.   pantoprazole 40 MG tablet Commonly known as: PROTONIX Take 40 mg by mouth every morning.   PROBIOTIC ACIDOPHILUS PO Take 1 capsule by mouth daily with supper.   Systane 0.4-0.3 % Soln Generic drug: Polyethyl Glycol-Propyl Glycol Place 1 drop into both eyes at bedtime.   Vitamin B12 500 MCG Tabs Take 500 mcg by mouth every morning.      Follow-up Information    [facility] Follow up in 4 week(s).   Specialty: Neurology Why: Stroke clinic.  Office will call with appointment date and time. Contact information: [address] [place] [phone]         No Known Allergies  Consultations: NEUROLOGY  Procedures/Studies: Ct Angio Head W Or Wo Contrast  Result Date: 08/27/2018 CLINICAL DATA:  Initial evaluation for acute stroke, left facial droop. EXAM: CT ANGIOGRAPHY HEAD AND NECK TECHNIQUE: Multidetector CT imaging of the head and neck was performed using the standard protocol during bolus administration of intravenous contrast. Multiplanar CT image reconstructions and MIPs were obtained to evaluate the vascular anatomy. Carotid stenosis measurements (when applicable) are obtained utilizing NASCET criteria, using the distal internal carotid diameter as the denominator. CONTRAST:  20m OMNIPAQUE IOHEXOL 350 MG/ML SOLN COMPARISON:  Prior head CT from earlier same day. FINDINGS: CTA NECK FINDINGS Aortic arch: Visualized aortic arch normal in caliber with normal 3 vessel morphology. Moderate atherosclerotic change about the arch and origin of the great vessels without hemodynamically significant stenosis. Visualized subclavian arteries widely patent. Right carotid system: Right common carotid artery patent from its origin to the bifurcation without stenosis. Calcified plaque about the right bifurcation with relatively mild narrowing  about the origin of the right ICA. No hemodynamically significant stenosis. Right ICA otherwise widely patent distally to the skull base. Left carotid system: Left common carotid artery patent from its origin to the bifurcation without stenosis. Mild calcified plaque about the origin of the left ICA without hemodynamically significant stenosis. Left ICA widely patent distally to the skull base without stenosis, dissection, or occlusion. Vertebral arteries: Both of the vertebral arteries arise from the subclavian arteries. Focal plaque at the origin of the right vertebral artery with approximate 50% stenosis. Vertebral arteries otherwise patent within the neck without stenosis, dissection,  or occlusion. Skeleton: No acute osseous abnormality. No discrete lytic or blastic osseous lesions. Mild multilevel cervical spondylolysis, most notable at C6-7. Other neck: No other acute soft tissue abnormality within the neck. Upper chest: Visualized upper chest demonstrates no acute finding. Review of the MIP images confirms the above findings CTA HEAD FINDINGS Anterior circulation: Petrous segments widely patent bilaterally. Scattered calcified plaque within the cavernous/supraclinoid ICAs with relatively mild diffuse narrowing. No high-grade stenosis. A1 segments widely patent. Left A1 hypoplastic. Normal anterior communicating artery complex. Anterior cerebral arteries widely patent to their distal aspects. No M1 stenosis or occlusion. Normal MCA bifurcations. Distal MCA branches well perfused and symmetric. Posterior circulation: Vertebral arteries patent to the vertebrobasilar junction without flow-limiting stenosis. Posterior inferior cerebral arteries patent bilaterally. Basilar artery somewhat diminutive but patent to its distal aspect without stenosis. Superior cerebral arteries patent bilaterally. Fetal type origin of the PCAs bilaterally. PCAs well perfused to their distal aspects without stenosis. Venous sinuses:  Grossly patent allowing for timing of the contrast bolus. Anatomic variants: Fetal type origin of the PCAs bilaterally with secondary overall diminutive vertebrobasilar system. Delayed phase: Not performed. Review of the MIP images confirms the above findings IMPRESSION: 1. Negative CTA for emergent large vessel occlusion. 2. Scattered calcified plaque about the origin of the ICAs as well as within the carotid siphons without hemodynamically significant stenosis. 3. 50% stenosis at the origin of the right vertebral artery. Vertebrobasilar system otherwise widely patent. 4. Overall diminutive vertebrobasilar system based on fetal type origin of the PCAs bilaterally. Electronically Signed   By: Jeannine Boga M.D.   On: 08/27/2018 22:12   Dg Chest 2 View  Result Date: 08/23/2018 CLINICAL DATA:  Altered mental status. EXAM: CHEST - 2 VIEW COMPARISON:  Chest x-rays dated 06/08/2018 and 03/02/2009. FINDINGS: Heart size and mediastinal contours are stable. Coarse interstitial markings again noted throughout both lungs, increased compared to the previous studies, suggesting acute interstitial edema superimposed on chronic interstitial lung disease. No confluent opacity to suggest consolidating pneumonia. No pleural effusion or pneumothorax seen. Retrocardiac density again noted, measuring approximately 8 cm width, not significantly changed compared to the previous study, compatible with aortic ectasia versus aneurysm, less likely hiatal hernia. IMPRESSION: 1. Coarse interstitial markings throughout both lungs, increased compared to the previous studies, suggesting acute interstitial edema superimposed on chronic interstitial lung disease. No evidence of consolidating pneumonia. 2. Persistent density overlying the lower mediastinum, measuring approximately 8 cm width, with differential considerations of aneurysmal dilatation of the thoracic aorta versus ectasia of the thoracic aorta, less likely hiatal hernia.  Recommend further characterization with chest CT if 1 has not been recently performed. Electronically Signed   By: Franki Cabot M.D.   On: 08/23/2018 15:10   Ct Head Wo Contrast  Result Date: 08/23/2018 CLINICAL DATA:  Altered level of consciousness. EXAM: CT HEAD WITHOUT CONTRAST TECHNIQUE: Contiguous axial images were obtained from the base of the skull through the vertex without intravenous contrast. COMPARISON:  06/08/2018 FINDINGS: Brain: Moderate atrophy. Moderate to extensive chronic white matter changes bilaterally appear stable from the prior study. No acute infarct, hemorrhage, mass. No midline shift Vascular: Negative for hyperdense vessel Skull: Negative Sinuses/Orbits: Mucosal edema paranasal sinuses. Bilateral ocular surgery Other: None IMPRESSION: Atrophy and chronic ischemia.  No acute abnormality. Electronically Signed   By: Franchot Gallo M.D.   On: 08/23/2018 12:46   Ct Angio Neck W Or Wo Contrast  Result Date: 08/27/2018 CLINICAL DATA:  Initial evaluation for acute stroke, left facial droop.  EXAM: CT ANGIOGRAPHY HEAD AND NECK TECHNIQUE: Multidetector CT imaging of the head and neck was performed using the standard protocol during bolus administration of intravenous contrast. Multiplanar CT image reconstructions and MIPs were obtained to evaluate the vascular anatomy. Carotid stenosis measurements (when applicable) are obtained utilizing NASCET criteria, using the distal internal carotid diameter as the denominator. CONTRAST:  27m OMNIPAQUE IOHEXOL 350 MG/ML SOLN COMPARISON:  Prior head CT from earlier same day. FINDINGS: CTA NECK FINDINGS Aortic arch: Visualized aortic arch normal in caliber with normal 3 vessel morphology. Moderate atherosclerotic change about the arch and origin of the great vessels without hemodynamically significant stenosis. Visualized subclavian arteries widely patent. Right carotid system: Right common carotid artery patent from its origin to the bifurcation  without stenosis. Calcified plaque about the right bifurcation with relatively mild narrowing about the origin of the right ICA. No hemodynamically significant stenosis. Right ICA otherwise widely patent distally to the skull base. Left carotid system: Left common carotid artery patent from its origin to the bifurcation without stenosis. Mild calcified plaque about the origin of the left ICA without hemodynamically significant stenosis. Left ICA widely patent distally to the skull base without stenosis, dissection, or occlusion. Vertebral arteries: Both of the vertebral arteries arise from the subclavian arteries. Focal plaque at the origin of the right vertebral artery with approximate 50% stenosis. Vertebral arteries otherwise patent within the neck without stenosis, dissection, or occlusion. Skeleton: No acute osseous abnormality. No discrete lytic or blastic osseous lesions. Mild multilevel cervical spondylolysis, most notable at C6-7. Other neck: No other acute soft tissue abnormality within the neck. Upper chest: Visualized upper chest demonstrates no acute finding. Review of the MIP images confirms the above findings CTA HEAD FINDINGS Anterior circulation: Petrous segments widely patent bilaterally. Scattered calcified plaque within the cavernous/supraclinoid ICAs with relatively mild diffuse narrowing. No high-grade stenosis. A1 segments widely patent. Left A1 hypoplastic. Normal anterior communicating artery complex. Anterior cerebral arteries widely patent to their distal aspects. No M1 stenosis or occlusion. Normal MCA bifurcations. Distal MCA branches well perfused and symmetric. Posterior circulation: Vertebral arteries patent to the vertebrobasilar junction without flow-limiting stenosis. Posterior inferior cerebral arteries patent bilaterally. Basilar artery somewhat diminutive but patent to its distal aspect without stenosis. Superior cerebral arteries patent bilaterally. Fetal type origin of the  PCAs bilaterally. PCAs well perfused to their distal aspects without stenosis. Venous sinuses: Grossly patent allowing for timing of the contrast bolus. Anatomic variants: Fetal type origin of the PCAs bilaterally with secondary overall diminutive vertebrobasilar system. Delayed phase: Not performed. Review of the MIP images confirms the above findings IMPRESSION: 1. Negative CTA for emergent large vessel occlusion. 2. Scattered calcified plaque about the origin of the ICAs as well as within the carotid siphons without hemodynamically significant stenosis. 3. 50% stenosis at the origin of the right vertebral artery. Vertebrobasilar system otherwise widely patent. 4. Overall diminutive vertebrobasilar system based on fetal type origin of the PCAs bilaterally. Electronically Signed   By: BJeannine BogaM.D.   On: 08/27/2018 22:12   Mr Brain Wo Contrast  Result Date: 08/28/2018 CLINICAL DATA:  TIA EXAM: MRI HEAD WITHOUT CONTRAST TECHNIQUE: Multiplanar, multiecho pulse sequences of the brain and surrounding structures were obtained without intravenous contrast. COMPARISON:  CTA head neck 08/27/2018 FINDINGS: BRAIN: There is no acute infarct, acute hemorrhage or extra-axial collection. The midline structures are normal. Diffuse confluent hyperintense T2-weighted signal within the periventricular, deep and juxtacortical white matter, most commonly due to chronic ischemic microangiopathy. Generalized advanced atrophy.  Susceptibility-sensitive sequences show no chronic microhemorrhage or superficial siderosis. No midline shift or other mass effect. VASCULAR: The major intracranial arterial and venous sinus flow voids are normal. SKULL AND UPPER CERVICAL SPINE: Calvarial bone marrow signal is normal. There is no skull base mass. Visualized upper cervical spine and soft tissues are normal. SINUSES/ORBITS: No fluid levels or advanced mucosal thickening. No mastoid or middle ear effusion. The orbits are normal.  IMPRESSION: Atrophy and severe chronic ischemic microangiopathy without acute intracranial abnormality. Electronically Signed   By: Ulyses Jarred M.D.   On: 08/28/2018 00:15   Mr Brain Wo Contrast  Result Date: 08/23/2018 CLINICAL DATA:  Altered level consciousness. Difficulty speaking. Unsteadiness. EXAM: MRI HEAD WITHOUT CONTRAST TECHNIQUE: Multiplanar, multiecho pulse sequences of the brain and surrounding structures were obtained without intravenous contrast. COMPARISON:  Head CT 08/23/2018 and MRI 04/24/2012 FINDINGS: Brain: There is no evidence of acute infarct, intracranial hemorrhage, mass, midline shift, or extra-axial fluid collection. Confluent T2 hyperintensities in the cerebral white matter have progressed from the prior MRI and are particularly notable in the occipital and parietal lobes without cortical involvement, nonspecific but compatible with severe chronic small vessel ischemic disease. There is moderate cerebral atrophy. Vascular: Major intracranial vascular flow voids are preserved. Skull and upper cervical spine: Unremarkable bone marrow signal. Sinuses/Orbits: Bilateral cataract extraction. Minimal bilateral maxillary sinus mucosal thickening. Trace right mastoid effusion. Other: None. IMPRESSION: 1. No acute intracranial abnormality. 2. Severe chronic small vessel ischemic disease, progressed from 2014. Electronically Signed   By: Logan Bores M.D.   On: 08/23/2018 15:08   Mr Thoracic Spine W Wo Contrast  Result Date: 08/23/2018 CLINICAL DATA:  Initial evaluation for abnormal CT, disc destruction at T8-9. EXAM: MRI THORACIC SPINE WITHOUT CONTRAST TECHNIQUE: Multiplanar, multisequence MR imaging of the thoracic spine was performed. No intravenous contrast was administered. CONTRAST:  5 cc of Gadavist. COMPARISON:  Prior CT from earlier the same day. FINDINGS: Alignment: Mild straightening of the normal mid thoracic kyphosis. No listhesis or malalignment. Vertebrae: Mild height loss  at the superior endplates of T1 and T4 are chronic in appearance. Vertebral body height otherwise maintained without evidence for acute or subacute fracture. Underlying bone marrow signal intensity within normal limits. Few scattered benign hemangiomas noted. No worrisome osseous lesions. Severe loss of disc space height with endplate irregularity seen at the T8-9 level, corresponding with abnormality seen on prior CT. Associated fluid signal intensity within the residual T8-9 disc with associated endplate irregularity. Abnormal marrow edema and enhancement within the adjacent T8 and T9 vertebral bodies. Findings compatible with acute osteomyelitis discitis. Edema and enhancement extends into the posterior elements bilaterally, with involvement of the T8-9 facets, which could reflect associated septic arthropathy. Associated mild epidural enhancement at this level without frank epidural abscess or collection. Paraspinous phlegmon and edema seen adjacent to the T8-9 interspace without discrete paraspinous abscess. Resultant moderate to severe spinal stenosis at this level. No other evidence for acute infection within the thoracic spine. No other abnormal marrow edema or enhancement. Cord: Signal intensity within the thoracic spinal cord is within normal limits. Specifically, no cord signal abnormality seen at the level of T8-9. Paraspinal soft tissues: Paraspinous edema and phlegmon without discrete abscess or collection adjacent to the T8-9 interspace. Paraspinous soft tissues otherwise within normal limits. Trace layering bilateral pleural effusions with associated mild atelectasis. Atherosclerotic change noted within the visualized aorta. 5.7 cm exophytic simple right renal cyst partially visualized. Visualized visceral structures otherwise unremarkable. Disc levels: T1-2: Minimal disc bulge.  Mild right greater than left facet hypertrophy. No significant spinal stenosis. Mild to moderate right with mild left  foraminal narrowing. T2-3: Minimal disc bulge with bilateral facet hypertrophy. No significant stenosis. T3-4: Minimal disc bulge with bilateral facet hypertrophy, greater on the right. No significant spinal stenosis. Mild right foraminal narrowing. T4-5: Negative interspace. Mild facet hypertrophy. No significant spinal stenosis. Mild right foraminal narrowing. T5-6: Negative interspace. Bilateral facet hypertrophy with associated small joint effusions. No spinal stenosis. Mild bilateral foraminal narrowing. T6-7: Negative interspace. Mild facet hypertrophy. No significant stenosis. T7-8: Shallow central disc protrusion mildly flattens the ventral thecal sac. Mild facet hypertrophy. No significant spinal stenosis. Foramina remain patent. T8-9: Changes consistent with osteomyelitis discitis with partial obliteration of the T8-9 interspace. Associated bilateral facet arthrosis with ligamentum flavum hypertrophy. Resultant moderate to severe spinal stenosis with mild flattening of the thoracic spinal cord. Thecal sac measures 5 mm in AP diameter at its most narrow point. No associated cord signal changes. Moderate to severe bilateral foraminal narrowing. T9-10: Negative interspace. Bilateral facet hypertrophy. No significant stenosis. T10-11: Negative interspace. Bilateral facet hypertrophy. No significant stenosis. T11-12: Mild annular disc bulge with facet hypertrophy. Mild spinal stenosis without cord impingement. Foramina remain patent. T12-L1:  Negative. IMPRESSION: 1. Findings consistent with acute osteomyelitis discitis at T8-9 as detailed above. Extension of edema into the posterior elements of T8-9 in about the bilateral T8-9 facets, which could reflect associated septic arthritis. Mild localized epidural enhancement without frank epidural abscess or collection. Resultant moderate to severe spinal stenosis at this level without cord signal changes. 2. Associated paraspinous phlegmon and enhancement adjacent  to the T8-9 interspace without discrete paraspinous abscess or drainable fluid collection. 3. Mild multilevel degenerative spondylolysis elsewhere within the thoracic spine without significant spinal stenosis. Mild to moderate bilateral foraminal narrowing within the upper thoracic spine as detailed above. Electronically Signed   By: Jeannine Boga M.D.   On: 08/23/2018 19:59   Dg Chest Port 1 View  Result Date: 08/26/2018 CLINICAL DATA:  PICC placement EXAM: PORTABLE CHEST 1 VIEW COMPARISON:  08/23/2018 chest radiograph. FINDINGS: Right PICC terminates in the middle third of the SVC. Stable cardiomediastinal silhouette with mild cardiomegaly and thickening of the posterior mediastinal stripe over the lower thoracic spine. No pneumothorax. No pleural effusion. Lungs appear clear, with no acute consolidative airspace disease and no pulmonary edema. IMPRESSION: 1. Right PICC terminates in the middle third of the SVC. 2. Stable thickening of the posterior mediastinal stripe over the lower thoracic spine compatible with known discitis osteomyelitis. 3. Stable mild cardiomegaly without pulmonary edema. No active pulmonary disease. Electronically Signed   By: Ilona Sorrel M.D.   On: 08/26/2018 11:03   Ct Angio Chest/abd/pel For Dissection W And/or Wo Contrast  Result Date: 08/23/2018 CLINICAL DATA:  85 year old with chest and back pain. Evaluate for aortic dissection. EXAM: CT ANGIOGRAPHY CHEST, ABDOMEN AND PELVIS TECHNIQUE: Multidetector CT imaging through the chest, abdomen and pelvis was performed using the standard protocol during bolus administration of intravenous contrast. Multiplanar reconstructed images and MIPs were obtained and reviewed to evaluate the vascular anatomy. CONTRAST:  188m OMNIPAQUE IOHEXOL 350 MG/ML SOLN COMPARISON:  Neck CTA 06/08/2018 FINDINGS: CTA CHEST FINDINGS Cardiovascular: Normal caliber of the thoracic aorta with dissection. Atherosclerotic calcifications throughout the  thoracic aorta. Great vessels are patent without dissection. Pulmonary arteries are patent without filling defects or pulmonary emboli. No significant pericardial fluid. There are coronary artery calcifications. Mediastinum/Nodes: Subcarinal tissue is prominent measuring up to 1.5 cm on sequence 6,  image 46. Mildly prominent lymph nodes in the AP window. AP window lymph node measuring 1.1 cm on sequence 6, image 36. Mildly prominent right hilar soft tissue. No axillary lymph node enlargement. Lungs/Pleura: Trachea and mainstem bronchi are patent. No large pleural effusions. No large areas of consolidation but there are prominent interstitial densities in the right lower lobe. Findings could be a combination of scarring and atelectasis. Mild thickening along the right major fissure on sequence 8, image 51 that is nonspecific. At least 2 small nodules in the right lower lobe, measuring 5 mm on sequence 6, image 44 and image 57. Pulmonary nodules are likely incidental findings. No suspicious pulmonary nodules. Musculoskeletal: There is marked abnormality at the T8-T9 disc space. There appears to be destruction of the disc space with destruction of the T8 inferior endplate and the T9 superior endplate. In addition, there is extensive osteophytosis or heterotopic bone formation in this region. Increased soft tissue at the level of T8-T9. Findings are concerning for underlying infectious or inflammatory process. Question mild superior compression deformity at T4. Review of the MIP images confirms the above findings. CTA ABDOMEN AND PELVIS FINDINGS VASCULAR Aorta: Atherosclerotic calcifications in the abdominal aorta without aneurysm or dissection. Celiac: Patent without evidence of aneurysm, dissection, vasculitis or significant stenosis. 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 SMA is patent. Renals: Both renal arteries are patent without evidence of aneurysm, dissection, vasculitis,  fibromuscular dysplasia or significant stenosis. IMA: IMA is patent. Inflow: Patent without evidence of aneurysm, dissection, vasculitis or significant stenosis. Veins: No obvious venous abnormality within the limitations of this arterial phase study. Incidentally, there is a retroaortic left renal vein. Review of the MIP images confirms the above findings. NON-VASCULAR Hepatobiliary: High-density material in the gallbladder is compatible with stones. No evidence for gallbladder inflammation. No acute abnormality to the liver. Pancreas: Artifact in the region of the pancreas but no gross abnormality. Spleen: Motion artifact in this region but no gross abnormality. Adrenals/Urinary Tract: Normal appearance of the adrenal glands. Large low-density structure along the right kidney upper pole is most compatible with a cyst measuring up to 6.6 cm. Negative for hydronephrosis. No suspicious renal lesions. Urinary bladder is displaced superiorly with wall thickening along the right side of the bladder. Bladder wall thickening is noted on sequence 6, image 159. Stomach/Bowel: Normal appendix. Normal appearance of the stomach. No evidence for bowel dilatation. No focal bowel inflammation. Lymphatic: No significant lymph node enlargement in the abdomen or pelvis. Reproductive: Prostate is markedly enlarged measuring roughly 6.3 x 5.9 x 8.8 cm. Other: Negative for free fluid. Negative for free air. Evidence for defect along the lateral left abdominal wall on sequence 6, image 116 that may be related to a fat containing hernia. Musculoskeletal: Both hips are located. Complete disc space loss at L2-L3. 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 Review of the MIP images confirms the above findings. IMPRESSION: 1. Negative for an aortic aneurysm or dissection. Aortic Atherosclerosis (ICD10-I70.0). 2. Destruction of the disc space at T8-T9 is suggestive for osteomyelitis and discitis at this level. Complete disc  space loss at L2-L3 could represent degenerative disc disease but cannot exclude discitis based on the findings at T8-T9. Recommend further characterization of the thoracolumbar spine with MRI. 3. Mild mediastinal lymphadenopathy is nonspecific but may be reactive. 4. Cholelithiasis. 5. Prostate enlargement. Wall thickening along the right side of the bladder is nonspecific and could be related to bladder outlet obstruction. 6.  Scattered interstitial thickening in the right lower lobe may represent a combination of atelectasis and chronic changes. Small nodular densities in lungs, largest measuring 5 mm, are likely incidental findings. 7. 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 These results were called by telephone at the time of interpretation on 08/23/2018 at 5:38 pm to Dr. Isla Pence, who verbally acknowledged these results. Electronically Signed   By: Markus Daft M.D.   On: 08/23/2018 17:39   Ct Head Code Stroke Wo Contrast  Result Date: 08/27/2018 CLINICAL DATA:  Code stroke. Initial evaluation for acute left facial droop. EXAM: CT HEAD WITHOUT CONTRAST TECHNIQUE: Contiguous axial images were obtained from the base of the skull through the vertex without intravenous contrast. COMPARISON:  Prior brain MRI from 08/23/2018. FINDINGS: Brain: Generalized age-related cerebral atrophy with advanced chronic microvascular ischemic disease. No acute intracranial hemorrhage. No acute large vessel territory infarct. No mass lesion, midline shift or mass effect. No hydrocephalus. No extra-axial fluid collection. Vascular: No hyperdense vessel. Scattered vascular calcifications noted within the carotid siphons. Skull: Scalp soft tissues and calvarium within normal limits. Sinuses/Orbits: Globes and orbital soft tissues demonstrate no acute findings. Scattered mucosal thickening within the maxillary sinuses bilaterally. Paranasal sinuses are otherwise clear. No mastoid effusion. Other: None. ASPECTS [facility] Stroke  Program Early CT Score) - Ganglionic level infarction (caudate, lentiform nuclei, internal capsule, insula, M1-M3 cortex): 7 - Supraganglionic infarction (M4-M6 cortex): 3 Total score (0-10 with 10 being normal): 10 IMPRESSION: 1. No acute intracranial infarct or other abnormality. 2. ASPECTS is 10. 3. Generalized age-related cerebral atrophy with advanced chronic microvascular ischemic disease. These results were communicated to Dr. Leonel Ramsay at 9:42 pmon 6/22/2020by text page via the [facility] messaging system. Electronically Signed   By: Jeannine Boga M.D.   On: 08/27/2018 21:45   [place] Ekg Site Rite  Result Date: 08/25/2018 If Site Rite image not attached, placement could not be confirmed due to current cardiac rhythm.   Subjective: Resting,On the bedside chair comfortably.  No nausea vomiting fever chills or any focal weakness.  Discharge Exam: Vitals:   08/29/18 0946 08/29/18 1142  BP: (!) 174/81 134/71  Pulse:  67  Resp:  20  Temp:  97.8 F (36.6 C)  SpO2:  99%   Vitals:   08/29/18 0347 08/29/18 0802 08/29/18 0946 08/29/18 1142  BP: 134/72 (!) 197/110 (!) 174/81 134/71  Pulse: 66 (!) 44  67  Resp: _0 Temp: 98.6 F (37 C) 98.3 F (36.8 C)  97.8 F (36.6 C)  TempSrc: Oral Oral  Oral  SpO2: 96% 100%  99%  Weight:      Height:        General: Pt is alert, awake, not in acute distress Cardiovascular: RRR, S1/S2 +, no rubs, no gallops Respiratory: CTA bilaterally, no wheezing, no rhonchi Abdominal: Soft, NT, ND, bowel sounds + Extremities: no edema, no cyanosis   The results of significant diagnostics from this hospitalization (including imaging, microbiology, ancillary and laboratory) are listed below for reference.     Microbiology: Recent Results (from the past 240 hour(s))  Culture, blood (routine x 2)     Status: None   Collection Time: 08/23/18  8:06 PM   Specimen: BLOOD  Result Value Ref Range Status   Specimen Description BLOOD RIGHT WRIST  Final    Special Requests   Final    BOTTLES DRAWN AEROBIC AND ANAEROBIC Blood Culture results may not be optimal due to an excessive volume of blood received in  culture bottles   Culture   Final    NO GROWTH 5 DAYS Performed at [facility], [place] [address] [place], [place] [address]    Report Status 08/28/2018 FINAL  Final  Culture, blood (routine x 2)     Status: None   Collection Time: 08/23/18  8:11 PM   Specimen: BLOOD  Result Value Ref Range Status   Specimen Description BLOOD LEFT ANTECUBITAL  Final   Special Requests   Final    BOTTLES DRAWN AEROBIC AND ANAEROBIC Blood Culture adequate volume   Culture   Final    NO GROWTH 5 DAYS Performed at [facility], [place] [address], [place], [place] [address]    Report Status 08/28/2018 FINAL  Final  Novel Coronavirus,NAA,(SEND-OUT TO REF LAB - TAT 24-48 hrs); Hosp Order     Status: None   Collection Time: 08/23/18  9:06 PM   Specimen: Nasopharyngeal Swab; Respiratory  Result Value Ref Range Status   SARS-CoV-2, NAA NOT DETECTED NOT DETECTED Final    Comment: (NOTE) This test was developed and its performance characteristics determined by [employer]. This test has not been FDA cleared or approved. This test has been authorized by FDA under an Emergency Use Authorization (EUA). This test is only authorized for the duration of time the declaration that circumstances exist justifying the authorization of the emergency use of in vitro diagnostic tests for detection of SARS-CoV-2 virus and/or diagnosis of COVID-19 infection under section 564(b)(1) of the Act, 21 U.S.C. 973ZHG-9(J)(2), unless the authorization is terminated or revoked sooner. When diagnostic testing is negative, the possibility of a false negative result should be considered in the context of a patient's recent exposures and the presence of clinical signs and symptoms consistent with COVID-19. An individual without symptoms of COVID-19 and who is not  shedding SARS-CoV-2 virus would expect to have a negative (not detected) result in this assay. Performed  At: [facility] [address] [place], [place] [id number] Rush Farmer MD QI:[id number]    [place]  Final    Comment: Performed at [facility], [place] [address], [place],  [address]  SARS Coronavirus 2 (CEPHEID - Performed in [place] hospital lab), Hosp Order     Status: None   Collection Time: 08/27/18 11:24 PM   Specimen: Nasopharyngeal Swab  Result Value Ref Range Status   SARS Coronavirus 2 NEGATIVE NEGATIVE Final    Comment: (NOTE) If result is NEGATIVE SARS-CoV-2 target nucleic acids are NOT DETECTED. The SARS-CoV-2 RNA is generally detectable in upper and lower  respiratory specimens during the acute phase of infection. The lowest  concentration of SARS-CoV-2 viral copies this assay can detect is 250  copies / mL. A negative result does not preclude SARS-CoV-2 infection  and should not be used as the sole basis for treatment or other  patient management decisions.  A negative result may occur with  improper specimen collection / handling, submission of specimen other  than nasopharyngeal swab, presence of viral mutation(s) within the  areas targeted by this assay, and inadequate number of viral copies  (<250 copies / mL). A negative result must be combined with clinical  observations, patient history, and epidemiological information. If result is POSITIVE SARS-CoV-2 target nucleic acids are DETECTED. The SARS-CoV-2 RNA is generally detectable in upper and lower  respiratory specimens dur ing the acute phase of infection.  Positive  results are indicative of active infection with SARS-CoV-2.  Clinical  correlation  with patient history and other diagnostic information is  necessary to determine patient infection status.  Positive results do  not rule out bacterial infection or co-infection with other viruses. If  result is PRESUMPTIVE POSTIVE SARS-CoV-2 nucleic acids MAY BE PRESENT.   A presumptive positive result was obtained on the submitted specimen  and confirmed on repeat testing.  While 2019 novel coronavirus  (SARS-CoV-2) nucleic acids may be present in the submitted sample  additional confirmatory testing may be necessary for epidemiological  and / or clinical management purposes  to differentiate between  SARS-CoV-2 and other Sarbecovirus currently known to infect humans.  If clinically indicated additional testing with an alternate test  methodology [phone]) is advised. The SARS-CoV-2 RNA is generally  detectable in upper and lower respiratory sp ecimens during the acute  phase of infection. The expected result is Negative. Fact Sheet for Patients:  StrictlyIdeas.no Fact Sheet for Healthcare Providers: BankingDealers.co.za This test is not yet approved or cleared by the [place] FDA and has been authorized for detection and/or diagnosis of SARS-CoV-2 by FDA under an Emergency Use Authorization (EUA).  This EUA will remain in effect (meaning this test can be used) for the duration of the COVID-19 declaration under Section 564(b)(1) of the Act, 21 U.S.C. section 360bbb-3(b)(1), unless the authorization is terminated or revoked sooner. Performed at [facility], [place] [address] [place], [place] [address]      Labs: BNP (last 3 results) No results for input(s): BNP in the last 8760 hours. Basic Metabolic Panel: Recent Labs  Lab 08/23/18 1202 08/24/18 0908 08/27/18 0608 08/27/18 2115 08/27/18 2119 08/29/18 0449  NA 128* 129* 127* 127* 126* 125*  K 4.2 3.8 4.2 3.9 3.9 3.5  CL 95* 95* 95* 93* 93* 92*  CO2 _0 --  23  GLUCOSE 98 141* 100* 113* 110* 107*  BUN _1 25*  CREATININE 1.20 1.28* 1.25* 1.37* 1.30* 1.25*  CALCIUM 9.1 9.1 8.9 9.0  --  8.5*   Liver Function Tests: Recent Labs  Lab  08/23/18 1202 08/27/18 2115  AST 30 35  ALT 19 24  ALKPHOS 78 84  BILITOT 1.4* 1.0  PROT 6.6 6.6  ALBUMIN 3.7 3.6   No results for input(s): LIPASE, AMYLASE in the last 168 hours. No results for input(s): AMMONIA in the last 168 hours. CBC: Recent Labs  Lab 08/23/18 1202 08/24/18 0908 08/25/18 0716 08/26/18 0545 08/27/18 0608 08/27/18 2115 08/27/18 2119  WBC 4.8 7.0 6.6 7.4 7.3 9.0  --   NEUTROABS 3.2  --   --   --   --  6.1  --   HGB 10.5* 10.4* 10.3* 10.4* 10.2* 11.1* 11.9*  HCT 31.2* 30.3* 29.7* 30.1* 29.8* 32.4* 35.0*  MCV 92.3 90.2 90.0 90.4 90.9 91.8  --   PLT 219 204 183 186 186 185  --    Cardiac Enzymes: Recent Labs  Lab 08/23/18 1202 08/27/18 0608  CKTOTAL  --  79  TROPONINI <0.03  --    BNP: Invalid input(s): POCBNP CBG: Recent Labs  Lab 08/27/18 2115  GLUCAP 107*   D-Dimer No results for input(s): DDIMER in the last 72 hours. Hgb A1c Recent Labs    08/28/18 1035  HGBA1C 5.0   Lipid Profile Recent Labs    08/28/18 1035  CHOL 137  HDL 60  LDLCALC 70  TRIG 34  CHOLHDL 2.3   Thyroid function studies No results for input(s): TSH, T4TOTAL,  T3FREE, THYROIDAB in the last 72 hours.  Invalid input(s): FREET3 Anemia work up No results for input(s): VITAMINB12, FOLATE, FERRITIN, TIBC, IRON, RETICCTPCT in the last 72 hours. Urinalysis    Component Value Date/Time   COLORURINE YELLOW 08/27/2018 2300   APPEARANCEUR CLEAR 08/27/2018 2300   LABSPEC 1.044 (H) 08/27/2018 2300   PHURINE 6.0 08/27/2018 2300   GLUCOSEU NEGATIVE 08/27/2018 2300   HGBUR SMALL (A) 08/27/2018 2300   BILIRUBINUR NEGATIVE 08/27/2018 2300   KETONESUR 5 (A) 08/27/2018 2300   PROTEINUR NEGATIVE 08/27/2018 2300   UROBILINOGEN 1.0 09/21/2014 1610   NITRITE NEGATIVE 08/27/2018 2300   LEUKOCYTESUR NEGATIVE 08/27/2018 2300   Sepsis Labs Invalid input(s): PROCALCITONIN,  WBC,  LACTICIDVEN Microbiology Recent Results (from the past 240 hour(s))  Culture, blood (routine x  2)     Status: None   Collection Time: 08/23/18  8:06 PM   Specimen: BLOOD  Result Value Ref Range Status   Specimen Description BLOOD RIGHT WRIST  Final   Special Requests   Final    BOTTLES DRAWN AEROBIC AND ANAEROBIC Blood Culture results may not be optimal due to an excessive volume of blood received in culture bottles   Culture   Final    NO GROWTH 5 DAYS Performed at [facility], [place] [address], [place], [place] [address]    Report Status 08/28/2018 FINAL  Final  Culture, blood (routine x 2)     Status: None   Collection Time: 08/23/18  8:11 PM   Specimen: BLOOD  Result Value Ref Range Status   Specimen Description BLOOD LEFT ANTECUBITAL  Final   Special Requests   Final    BOTTLES DRAWN AEROBIC AND ANAEROBIC Blood Culture adequate volume   Culture   Final    NO GROWTH 5 DAYS Performed at [facility], [place] [address], [place], [place] [address]    Report Status 08/28/2018 FINAL  Final  Novel Coronavirus,NAA,(SEND-OUT TO REF LAB - TAT 24-48 hrs); Hosp Order     Status: None   Collection Time: 08/23/18  9:06 PM   Specimen: Nasopharyngeal Swab; Respiratory  Result Value Ref Range Status   SARS-CoV-2, NAA NOT DETECTED NOT DETECTED Final    Comment: (NOTE) This test was developed and its performance characteristics determined by [employer]. This test has not been FDA cleared or approved. This test has been authorized by FDA under an Emergency Use Authorization (EUA). This test is only authorized for the duration of time the declaration that circumstances exist justifying the authorization of the emergency use of in vitro diagnostic tests for detection of SARS-CoV-2 virus and/or diagnosis of COVID-19 infection under section 564(b)(1) of the Act, 21 U.S.C. 671IWP-8(K)(9), unless the authorization is terminated or revoked sooner. When diagnostic testing is negative, the possibility of a false negative result should be considered in the context of  a patient's recent exposures and the presence of clinical signs and symptoms consistent with COVID-19. An individual without symptoms of COVID-19 and who is not shedding SARS-CoV-2 virus would expect to have a negative (not detected) result in this assay. Performed  At: [facility] [address] [place], [place] [id number] Rush Farmer MD LZ:[id number]    [place]  Final    Comment: Performed at [facility], [place] [address] [place], [place] [address]  SARS Coronavirus 2 (CEPHEID - Performed in [facility] hospital lab), Hosp Order     Status: None   Collection Time: 08/27/18 11:24 PM  Specimen: Nasopharyngeal Swab  Result Value Ref Range Status   SARS Coronavirus 2 NEGATIVE NEGATIVE Final    Comment: (NOTE) If result is NEGATIVE SARS-CoV-2 target nucleic acids are NOT DETECTED. The SARS-CoV-2 RNA is generally detectable in upper and lower  respiratory specimens during the acute phase of infection. The lowest  concentration of SARS-CoV-2 viral copies this assay can detect is 250  copies / mL. A negative result does not preclude SARS-CoV-2 infection  and should not be used as the sole basis for treatment or other  patient management decisions.  A negative result may occur with  improper specimen collection / handling, submission of specimen other  than nasopharyngeal swab, presence of viral mutation(s) within the  areas targeted by this assay, and inadequate number of viral copies  (<250 copies / mL). A negative result must be combined with clinical  observations, patient history, and epidemiological information. If result is POSITIVE SARS-CoV-2 target nucleic acids are DETECTED. The SARS-CoV-2 RNA is generally detectable in upper and lower  respiratory specimens dur ing the acute phase of infection.  Positive  results are indicative of active infection with SARS-CoV-2.  Clinical  correlation with patient history and other diagnostic  information is  necessary to determine patient infection status.  Positive results do  not rule out bacterial infection or co-infection with other viruses. If result is PRESUMPTIVE POSTIVE SARS-CoV-2 nucleic acids MAY BE PRESENT.   A presumptive positive result was obtained on the submitted specimen  and confirmed on repeat testing.  While 2019 novel coronavirus  (SARS-CoV-2) nucleic acids may be present in the submitted sample  additional confirmatory testing may be necessary for epidemiological  and / or clinical management purposes  to differentiate between  SARS-CoV-2 and other Sarbecovirus currently known to infect humans.  If clinically indicated additional testing with an alternate test  methodology [phone]) is advised. The SARS-CoV-2 RNA is generally  detectable in upper and lower respiratory sp ecimens during the acute  phase of infection. The expected result is Negative. Fact Sheet for Patients:  StrictlyIdeas.no Fact Sheet for Healthcare Providers: BankingDealers.co.za This test is not yet approved or cleared by the [place] FDA and has been authorized for detection and/or diagnosis of SARS-CoV-2 by FDA under an Emergency Use Authorization (EUA).  This EUA will remain in effect (meaning this test can be used) for the duration of the COVID-19 declaration under Section 564(b)(1) of the Act, 21 U.S.C. section 360bbb-3(b)(1), unless the authorization is terminated or revoked sooner. Performed at [facility], [place] [address] [place], [place] [address]      Time coordinating discharge: 35 minutes  SIGNED:   Antonieta Pert, MD  [facility] 08/29/2018, 2:08 PM  If 7PM-7AM, please contact night-coverage [email or web address]

## 2018-08-29 NOTE — Progress Notes (Signed)
Physical Therapy Treatment Patient Details Name: Cody Mendoza MRN: [id number] DOB: 1921-10-07 Today's Date: 08/29/2018    History of Present Illness 85 y.o. male with medical history significant of A. fib on Pradaxa, HTN, mitral valve insufficiency, GERD, hard of hearing, lives at home with his wife. atient recently admitted from 6/18 to 6/22 ith TIA symptoms.  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  Reportedly leaning on counter at home.  911 called.  Initial BPs in 90s apparently.  EMS noted L sided weakness and L facial droop which was present on arrival to ED. MRI negative for acute changes    PT Comments    Pt progressing towards physical therapy goals. Was able to perform transfers and ambulation with gross min guard assist to min assist for balance support and safety with RW. Pt appears more unsteady today and would not have been safe with a cane - needed BUE support. Continue to recommend HHPT follow up with family support as much as possible. Will contniue to follow.     Follow Up Recommendations  Home health PT     Equipment Recommendations  None recommended by PT    Recommendations for Other Services       Precautions / Restrictions Precautions Precautions: Fall Restrictions Weight Bearing Restrictions: No    Mobility  Bed Mobility Overal bed mobility: Needs Assistance Bed Mobility: Sit to Supine     Supine to sit: Min guard     General bed mobility comments: Close guard for safety as pt transitioned from EOB to supine. Increased time required.   Transfers Overall transfer level: Needs assistance Equipment used: Rolling walker (2 wheeled) Transfers: Sit to/from Stand Sit to Stand: Min guard         General transfer comment: Hands-on guarding required for power-up to full stand. Pt appears unsteady and grabbing  tightly to walker to steady himself.   Ambulation/Gait Ambulation/Gait assistance: Min assist Gait Distance (Feet): 150 Feet Assistive device: Rolling walker (2 wheeled) Gait Pattern/deviations: Decreased stride length;Shuffle;Trunk flexed;Narrow base of support Gait velocity: Decreased Gait velocity interpretation: <1.8 ft/sec, indicate of risk for recurrent falls General Gait Details: RW utilized today for increased support. Pt requiring frequent VC's for proper hand placement on walker and safety. Occasional min assist provided for balance recovery.    Stairs             Wheelchair Mobility    Modified Rankin (Stroke Patients Only) Modified Rankin (Stroke Patients Only) Pre-Morbid Rankin Score: Slight disability Modified Rankin: Moderately severe disability     Balance Overall balance assessment: Needs assistance Sitting-balance support: Feet supported Sitting balance-Leahy Scale: Poor Sitting balance - Comments: Posterior lean with LE MMT   Standing balance support: Single extremity supported Standing balance-Leahy Scale: Poor Standing balance comment: Reliant on at least 1 UE support                            Cognition Arousal/Alertness: Awake/alert Behavior During Therapy: WFL for tasks assessed/performed Overall Cognitive Status: Within Functional Limits for tasks assessed                                        Exercises      General Comments  Pertinent Vitals/Pain Pain Assessment: No/denies pain    Home Living                      Prior Function            PT Goals (current goals can now be found in the care plan section) Acute Rehab PT Goals Patient Stated Goal: to go home to his wife PT Goal Formulation: With patient Time For Goal Achievement: 09/04/18 Potential to Achieve Goals: Good Progress towards PT goals: Progressing toward goals    Frequency    Min 3X/week      PT Plan Current  plan remains appropriate    Co-evaluation              AM-PAC PT "6 Clicks" Mobility   Outcome Measure  Help needed turning from your back to your side while in a flat bed without using bedrails?: None Help needed moving from lying on your back to sitting on the side of a flat bed without using bedrails?: None Help needed moving to and from a bed to a chair (including a wheelchair)?: A Little Help needed standing up from a chair using your arms (e.g., wheelchair or bedside chair)?: A Little Help needed to walk in hospital room?: A Little Help needed climbing 3-5 steps with a railing? : A Little 6 Click Score: 20    End of Session Equipment Utilized During Treatment: Gait belt Activity Tolerance: Patient tolerated treatment well Patient left: in chair;with call bell/phone within reach;with chair alarm set Nurse Communication: Mobility status PT Visit Diagnosis: Unsteadiness on feet (R26.81);Other symptoms and signs involving the nervous system (V29.191)     Time: 6606-0045 PT Time Calculation (min) (ACUTE ONLY): 23 min  Charges:  $Gait Training: 23-37 mins                     Cody Mendoza, PT, DPT Acute Rehabilitation Services Pager: [phone] Office: [phone]    Cody Mendoza 08/29/2018, 1:25 PM

## 2018-08-29 NOTE — Progress Notes (Signed)
Patient given discharge instructions and teaching, able to verbalize understanding and teach back. No new questions or concerns. IV and tele removed. Patient discharged home, transported home by daughter.

## 2018-08-29 NOTE — TOC Initial Note (Signed)
Transition of Care [facility]) - Initial/Assessment Note    Patient Details  Name: Cody Mendoza MRN: [id number] Date of Birth: October 13, 1921  Transition of Care [facility]) CM/SW Contact:    Pollie Friar, RN Phone Number: 08/29/2018, 2:14 PM  Clinical Narrative:                 Pt set up last admit for [facility] RN through [facility] but [facility] is in network and the [facility] RN/PT/OT has switched to [facility]. TOC spoke to daughter and she is in agreement with change. [place] RN to be at the home tomorrow for first visit. Daughter to provide transport home when pt is ready for d/c.  Expected Discharge Plan: [place] Barriers to Discharge: No Barriers Identified   Patient Goals and CMS Choice   CMS Medicare.gov Compare Post Acute Care list provided to:: Patient Represenative (must comment) Choice offered to / list presented to : Adult Children(daughter)  Expected Discharge Plan and Services Expected Discharge Plan: [place]   Discharge Planning Services: CM Consult Post Acute Care Choice: Home Health   Expected Discharge Date: 08/29/18                         HH Arranged: RN, PT, OT [facility] Agency: [facility] [place] Date [facility] Agency Contacted: 08/29/18   Representative spoke with at [place]  Prior Living Arrangements/Services   Lives with:: Spouse Patient language and need for interpreter reviewed:: Yes(no needs) Do you feel safe going back to the place where you live?: Yes      Need for Family Participation in Patient Care: Yes (Comment) Care giver support system in place?: Yes (comment)(daughter and spouse able to assist) Current home services: Home RN Criminal Activity/Legal Involvement Pertinent to Current Situation/Hospitalization: No - Comment as needed  Activities of Daily Living      Permission Sought/Granted                  Emotional Assessment              Admission diagnosis:  TIA (transient ischemic attack)  [G45.9] Patient Active Problem List   Diagnosis Date Noted  . Near syncope 08/28/2018  . Hyponatremia 08/28/2018  . TIA (transient ischemic attack) 08/27/2018  . Discitis of thoracic region 08/23/2018  . CKD (chronic kidney disease) stage 2, GFR 60-89 ml/min 08/23/2018  . Chronic anemia 08/23/2018  . Discitis 08/23/2018  . Puncture wound of right lower leg without foreign body 10/08/2013  . Mitral insufficiency 12/28/2010  . Atrial fibrillation ([place]) 11/16/2010  . Hypertension   . GERD 04/22/2010  . PERSONAL HX COLONIC POLYPS 04/22/2010   PCP:  Crist Infante, MD Pharmacy:   [facility] [address], [place] - [place] AT [place] [address] [place] [address] Phone: [phone] Fax: [phone]  [facility] [place], [place] [address] [place] [address] Phone: [phone] Fax: [phone]     Social Determinants of Health (SDOH) Interventions    Readmission Risk Interventions No flowsheet data found.

## 2018-08-30 ENCOUNTER — Emergency Department (HOSPITAL_COMMUNITY)
Admission: EM | Admit: 2018-08-30 | Discharge: 2018-08-30 | Disposition: A | Discharge status: Home / Self Care | Payer: Medicare Other | Attending: Emergency Medicine | Admitting: Emergency Medicine

## 2018-08-30 ENCOUNTER — Other Ambulatory Visit: Payer: Self-pay

## 2018-08-30 ENCOUNTER — Emergency Department (HOSPITAL_COMMUNITY): Payer: Medicare Other

## 2018-08-30 DIAGNOSIS — R55 Syncope and collapse: Secondary | ICD-10-CM | POA: Diagnosis not present

## 2018-08-30 DIAGNOSIS — Z8673 Personal history of transient ischemic attack (TIA), and cerebral infarction without residual deficits: Secondary | ICD-10-CM | POA: Insufficient documentation

## 2018-08-30 DIAGNOSIS — R5383 Other fatigue: Secondary | ICD-10-CM | POA: Diagnosis present

## 2018-08-30 DIAGNOSIS — I129 Hypertensive chronic kidney disease with stage 1 through stage 4 chronic kidney disease, or unspecified chronic kidney disease: Secondary | ICD-10-CM | POA: Insufficient documentation

## 2018-08-30 DIAGNOSIS — J45909 Unspecified asthma, uncomplicated: Secondary | ICD-10-CM | POA: Insufficient documentation

## 2018-08-30 DIAGNOSIS — N182 Chronic kidney disease, stage 2 (mild): Secondary | ICD-10-CM | POA: Diagnosis not present

## 2018-08-30 DIAGNOSIS — Z79899 Other long term (current) drug therapy: Secondary | ICD-10-CM | POA: Diagnosis not present

## 2018-08-30 LAB — LACTIC ACID, PLASMA
Lactic Acid, Venous: 1.2 mmol/L (ref 0.5–1.9)
Lactic Acid, Venous: 0.6 mmol/L (ref 0.5–1.9)

## 2018-08-30 LAB — URINALYSIS, ROUTINE W REFLEX MICROSCOPIC
Bilirubin Urine: NEGATIVE
Glucose, UA: NEGATIVE mg/dL
Hgb urine dipstick: NEGATIVE
Ketones, ur: NEGATIVE mg/dL
Leukocytes,Ua: NEGATIVE
Nitrite: NEGATIVE
Protein, ur: 30 mg/dL — AB
Specific Gravity, Urine: 1.019 (ref 1.005–1.030)
pH: 5 (ref 5.0–8.0)

## 2018-08-30 LAB — CBC WITH DIFFERENTIAL/PLATELET
Abs Immature Granulocytes: 0.05 10*3/uL (ref 0.00–0.07)
Basophils Absolute: 0.1 10*3/uL (ref 0.0–0.1)
Basophils Relative: 1 %
Eosinophils Absolute: 0.2 10*3/uL (ref 0.0–0.5)
Eosinophils Relative: 2 %
HCT: 29.9 % — ABNORMAL LOW (ref 39.0–52.0)
Hemoglobin: 10 g/dL — ABNORMAL LOW (ref 13.0–17.0)
Immature Granulocytes: 1 %
Lymphocytes Relative: 10 %
Lymphs Abs: 0.9 10*3/uL (ref 0.7–4.0)
MCH: 31.1 pg (ref 26.0–34.0)
MCHC: 33.4 g/dL (ref 30.0–36.0)
MCV: 92.9 fL (ref 80.0–100.0)
Monocytes Absolute: 1.1 10*3/uL — ABNORMAL HIGH (ref 0.1–1.0)
Monocytes Relative: 13 %
Neutro Abs: 6.2 10*3/uL (ref 1.7–7.7)
Neutrophils Relative %: 73 %
Platelets: 201 10*3/uL (ref 150–400)
RBC: 3.22 MIL/uL — ABNORMAL LOW (ref 4.22–5.81)
RDW: 12.4 % (ref 11.5–15.5)
WBC: 8.5 10*3/uL (ref 4.0–10.5)
nRBC: 0 % (ref 0.0–0.2)

## 2018-08-30 LAB — COMPREHENSIVE METABOLIC PANEL
ALT: 23 U/L (ref 0–44)
AST: 31 U/L (ref 15–41)
Albumin: 3.6 g/dL (ref 3.5–5.0)
Alkaline Phosphatase: 83 U/L (ref 38–126)
Anion gap: 9 (ref 5–15)
BUN: 30 mg/dL — ABNORMAL HIGH (ref 8–23)
CO2: 22 mmol/L (ref 22–32)
Calcium: 9 mg/dL (ref 8.9–10.3)
Chloride: 98 mmol/L (ref 98–111)
Creatinine, Ser: 1.35 mg/dL — ABNORMAL HIGH (ref 0.61–1.24)
GFR calc Af Amer: 52 mL/min — ABNORMAL LOW (ref >60)
GFR calc non Af Amer: 45 mL/min — ABNORMAL LOW (ref >60)
Glucose, Bld: 111 mg/dL — ABNORMAL HIGH (ref 70–99)
Potassium: 3.9 mmol/L (ref 3.5–5.1)
Sodium: 129 mmol/L — ABNORMAL LOW (ref 135–145)
Total Bilirubin: 0.8 mg/dL (ref 0.3–1.2)
Total Protein: 6.7 g/dL (ref 6.5–8.1)

## 2018-08-30 MED ORDER — SODIUM CHLORIDE 0.9% FLUSH: 10.0000 mL | INTRAVENOUS | Status: DC | PRN

## 2018-08-30 MED ORDER — HEPARIN SOD (PORK) LOCK FLUSH 100 UNIT/ML IV SOLN
250.0000 [IU] | INTRAVENOUS | Status: AC | PRN
  Administered 2018-08-30: 250 [IU]

## 2018-08-30 NOTE — Discharge Instructions (Addendum)
You were seen in the emergency department for being less responsive and having a low blood pressure.  The symptoms had resolved by the time you were here and your lab work EKG and urine test were unremarkable.  This is likely related to your blood pressure and the change in your blood pressure medications.  Please follow-up with your doctor regarding this.  Return if any concerns.

## 2018-08-30 NOTE — ED Notes (Signed)
Patient able to ambulate at baseline with assistive device in place. Patient denied any pain, dizziness, or shortness of breath.

## 2018-08-30 NOTE — ED Triage Notes (Signed)
[facility] reports patient became lethargic and looked a little pale. Home health nurse took blood pressure and it was around 80/40. This lasted for about 2-3 minutes. Last bp- 111/60, 94% RA, 60 irregular hr and blood sugar 161 and temp of 98.7. Just started carvedilol a few days ago.

## 2018-08-30 NOTE — ED Provider Notes (Signed)
[facility] EMERGENCY DEPARTMENT Provider Note   CSN: [id number] Arrival date & time: 08/30/18  1524     History   Chief Complaint Chief Complaint  Patient presents with  . Near Syncope    HPI Cody Mendoza is a 85 y.o. male.  He has a history of A. fib and is on anticoagulation.  He has been in and out of the hospital this month for osteomyelitis of the spine and then just got discharged yesterday after possible TIA event.  Today he was at home when he became lethargic and looked pale and his nurse took his blood pressure and it was 80/40.  He said he felt like he was in [place].  He said this lasted a few minutes and EMS had his initial blood pressure in the 110s.  He currently denies any complaints.  He says they have been giving him IV antibiotics through a PICC line and have adjusted his blood pressure medicines.  He denies any pain no headache no chest pain no abdominal pain no numbness or weakness.     The history is provided by the patient.  Near Syncope This is a recurrent problem. The current episode started 1 to 2 hours ago. The problem has been resolved. Pertinent negatives include no chest pain, no abdominal pain, no headaches and no shortness of breath. Nothing aggravates the symptoms. Nothing relieves the symptoms. He has tried nothing for the symptoms. The treatment provided no relief.    Past Medical History:  Diagnosis Date  . AF (atrial fibrillation) ([place])   . Asthma   . Detached retina   . Discitis of thoracic region 08/2018  . Diverticulosis   . GERD (gastroesophageal reflux disease)   . [name] syndrome   . Hypertension   . Lactose intolerance   . Mitral insufficiency   . Renal calculi   . TIA (transient ischemic attack)     Patient Active Problem List   Diagnosis Date Noted  . Near syncope 08/28/2018  . Hyponatremia 08/28/2018  . TIA (transient ischemic attack) 08/27/2018  . Discitis of thoracic region 08/23/2018  . CKD  (chronic kidney disease) stage 2, GFR 60-89 ml/min 08/23/2018  . Chronic anemia 08/23/2018  . Discitis 08/23/2018  . Puncture wound of right lower leg without foreign body 10/08/2013  . Mitral insufficiency 12/28/2010  . Atrial fibrillation ([place]) 11/16/2010  . Hypertension   . GERD 04/22/2010  . PERSONAL HX COLONIC POLYPS 04/22/2010    Past Surgical History:  Procedure Laterality Date  . cataract surgery    . ESOPHAGEAL DILATION     In the past  . FINGER TENDON REPAIR    . INGUINAL HERNIA REPAIR    . KIDNEY STONE SURGERY     Status post syrgical removal of a   . TONSILLECTOMY          Home Medications    Prior to Admission medications   Medication Sig Start Date End Date Taking? Authorizing Provider  carvedilol (COREG) 3.125 MG tablet Take 1 tablet (3.125 mg total) by mouth 2 (two) times daily with a meal. 08/27/18   Hongalgi, Lenis Dickinson, MD  cefTRIAXone (ROCEPHIN) IVPB Inject 2 g into the vein daily for 25 days. Indication:  Thoracic osteomyelitis and discitis Last Day of Therapy:  09/21/2018 Labs - Once weekly:  CBC/D and BMP, Labs - Every other week:  ESR and CRP 08/27/18 09/21/18  Hongalgi, Lenis Dickinson, MD  Cyanocobalamin (VITAMIN B12) 500 MCG TABS Take 500  mcg by mouth every morning.     [provider]  dabigatran (PRADAXA) 150 MG CAPS Take 1 capsule (150 mg total) by mouth every 12 (twelve) hours. 01/18/12   [place]ique, Peter M, MD  daptomycin (CUBICIN) IVPB Inject 500 mg into the vein daily for 25 days. Indication:  Thoracic osteomyelitis and discitis  Last Day of Therapy:  09/21/2018 Labs - Once weekly:  CBC/D, BMP, and CPK Labs - Every other week:  ESR and CRP 08/27/18 09/21/18  Hongalgi, Lenis Dickinson, MD  doxazosin (CARDURA) 4 MG tablet Take 0.5 tablets (2 mg total) by mouth daily. 08/28/18   Hongalgi, Lenis Dickinson, MD  Lactobacillus (PROBIOTIC ACIDOPHILUS PO) Take 1 capsule by mouth daily with supper.     [provider]  latanoprost (XALATAN) 0.005 % ophthalmic solution  Place 1 drop into both eyes at bedtime.  09/05/17   [provider]  losartan (COZAAR) 100 MG tablet Take 50 mg by mouth 2 (two) times daily.     [provider]  Multiple Vitamin (MULTIVITAMIN WITH MINERALS) TABS Take 1 tablet by mouth daily with supper.     [provider]  Multiple Vitamins-Minerals (ICAPS AREDS FORMULA PO) Take 1 capsule by mouth 2 (two) times daily with breakfast and lunch.     [provider]  pantoprazole (PROTONIX) 40 MG tablet Take 40 mg by mouth every morning.     [provider]  Polyethyl Glycol-Propyl Glycol (SYSTANE) 0.4-0.3 % SOLN Place 1 drop into both eyes at bedtime.     [provider]    Family History Family History  Problem Relation Age of Onset  . Hypertension Mother     Social History Social History   Tobacco Use  . Smoking status: Never Smoker  . Smokeless tobacco: Never Used  Substance Use Topics  . Alcohol use: Yes    Alcohol/week: 0.0 standard drinks    Comment: rarely  . Drug use: Never     Allergies   Patient has no known allergies.   Review of Systems Review of Systems  Constitutional: Negative for fever.  HENT: Negative for sore throat.   Eyes: Negative for pain.  Respiratory: Negative for shortness of breath.   Cardiovascular: Positive for near-syncope. Negative for chest pain.  Gastrointestinal: Negative for abdominal pain.  Genitourinary: Negative for dysuria.  Musculoskeletal: Negative for back pain.  Skin: Negative for rash.  Neurological: Negative for headaches.     Physical Exam Updated Vital Signs BP (!) 144/74   Pulse 71   Temp 98 F (36.7 C) (Oral)   Resp 16   SpO2 98%   Physical Exam Vitals signs and nursing note reviewed.  Constitutional:      Appearance: He is well-developed.  HENT:     Head: Normocephalic and atraumatic.  Eyes:     Conjunctiva/sclera: Conjunctivae normal.     Comments: He has anisocoria likely due to prior retinal  detachment.  Neck:     Musculoskeletal: Neck supple.  Cardiovascular:     Rate and Rhythm: Normal rate and regular rhythm.     Pulses: Normal pulses.     Heart sounds: No murmur.  Pulmonary:     Effort: Pulmonary effort is normal. No respiratory distress.     Breath sounds: Normal breath sounds.  Abdominal:     Palpations: Abdomen is soft.     Tenderness: There is no abdominal tenderness. There is no guarding or rebound.  Musculoskeletal: Normal range of motion.  Right lower leg: No edema.     Left lower leg: No edema.     Comments: PICC line right antecubital.  Skin:    General: Skin is warm and dry.     Capillary Refill: Capillary refill takes less than 2 seconds.  Neurological:     General: No focal deficit present.     Mental Status: He is alert. Mental status is at baseline.      ED Treatments / Results  Labs (all labs ordered are listed, but only abnormal results are displayed) Labs Reviewed  COMPREHENSIVE METABOLIC PANEL - Abnormal; Notable for the following components:      Result Value   Sodium 129 (*)    Glucose, Bld 111 (*)    BUN 30 (*)    Creatinine, Ser 1.35 (*)    GFR calc non Af Amer 45 (*)    GFR calc Af Amer 52 (*)    All other components within normal limits  CBC WITH DIFFERENTIAL/PLATELET - Abnormal; Notable for the following components:   RBC 3.22 (*)    Hemoglobin 10.0 (*)    HCT 29.9 (*)    Monocytes Absolute 1.1 (*)    All other components within normal limits  URINALYSIS, ROUTINE W REFLEX MICROSCOPIC - Abnormal; Notable for the following components:   APPearance HAZY (*)    Protein, ur 30 (*)    Bacteria, UA RARE (*)    All other components within normal limits  CULTURE, BLOOD (ROUTINE X 2)  CULTURE, BLOOD (ROUTINE X 2)  LACTIC ACID, PLASMA  LACTIC ACID, PLASMA    EKG EKG Interpretation  Date/Time:  Thursday August 30 2018 16:41:01 EDT Ventricular Rate:  76 PR Interval:    QRS Duration: 81 QT Interval:  407 QTC Calculation:  458 R Axis:   -22 Text Interpretation:  Atrial fibrillation Inferior infarct, old Anterior infarct, old similar to prior 6/20 Confirmed by Aletta Edouard [phone]) on 08/30/2018 4:58:19 PM   Radiology Dg Chest Port 1 View  Result Date: 08/30/2018 CLINICAL DATA:  Weakness and lethargy EXAM: PORTABLE CHEST 1 VIEW COMPARISON:  August 26, 2018 FINDINGS: The heart size and mediastinal contours are stable. The heart size is enlarged. Right central venous line is identified unchanged. There is no focal infiltrate, pulmonary edema, or pleural effusion. Previously noted stable thickening of posterior mediastinal stripe over the lower thoracic spine is unchanged. The visualized skeletal structures are stable. IMPRESSION: No active cardiopulmonary disease. Stable cardiomegaly. Stable sticking of the posterior mediastinal stripe over the lower thoracic spine is unchanged compared prior exam. Electronically Signed   By: Abelardo Diesel M.D.   On: 08/30/2018 16:37    Procedures Procedures (including critical care time)  Medications Ordered in ED Medications  heparin lock flush 100 unit/mL (250 Units Intracatheter Given 08/30/18 2134)     Initial Impression / Assessment and Plan / ED Course  I have reviewed the triage vital signs and the nursing notes.  Pertinent labs & imaging results that were available during my care of the patient were reviewed by me and considered in my medical decision making (see chart for details).  Clinical Course as of Aug 30 932  Thu Aug 30, 7618  7776 85 year old male here after a less responsive episode and low blood pressure.  This seems to resolve that he is back to baseline.  Says this is happened to him before but he cannot really tell me when or why.  He was recently in the hospital for osteo-spine  and on antibiotics through his pick.  He was then in for a TIA.  His blood pressures medications were adjusted.  Differential diagnosis of his spell includes TIA, symptomatic  hypotension, arrhythmia, anemia, sepsis, hypovolemia.   [MB]  9166 Patient's hemoglobin back and low at 10.0.  On review of his prior values he seems like he is ranged between 10 and 12.   [MB]  1958 Patient ambulated here in the department without any difficulties.  We will see how we can get him home.   [MB]  2049 Patient has been resting comfortably.  His blood pressure is been up but the most recent one on the computer is 175.  I have asked the nurse to check and to find out if he has a way home or not.   [MB]    Clinical Course User Index [MB] Hayden Rasmussen, MD       Final Clinical Impressions(s) / ED Diagnoses   Final diagnoses:  Near syncope    ED Discharge Orders    None       Hayden Rasmussen, MD 08/31/18 [phone]

## 2018-09-04 LAB — CULTURE, BLOOD (ROUTINE X 2)
Culture: NO GROWTH
Culture: NO GROWTH
Special Requests: ADEQUATE

## 2018-09-05 DIAGNOSIS — M8660 Other chronic osteomyelitis, unspecified site: Secondary | ICD-10-CM | POA: Insufficient documentation

## 2018-09-05 DIAGNOSIS — R197 Diarrhea, unspecified: Secondary | ICD-10-CM | POA: Insufficient documentation

## 2018-09-12 ENCOUNTER — Telehealth: Payer: Self-pay | Admitting: *Deleted

## 2018-09-12 NOTE — Telephone Encounter (Signed)
Per Baxter Flattery called the patient to set up a visit due to Edema. Patient daughter answered the phone as he was "getting treatment" and advised she can bring him in tomorrow 09/13/18 at 930 to see [place].

## 2018-09-12 NOTE — Telephone Encounter (Signed)
-----   Message from Carlyle Basques, MD sent at 09/12/2018 12:23 PM EDT ----- Can we bring him in to review lower extremity edema. We are treating him for thoracic osteomyelitis. If greg has an opening tomorrow that would be great. Otherwise it would be Monday. I am at [employer]

## 2018-09-13 ENCOUNTER — Encounter: Payer: Self-pay | Admitting: Family

## 2018-09-13 ENCOUNTER — Ambulatory Visit (INDEPENDENT_AMBULATORY_CARE_PROVIDER_SITE_OTHER): Payer: Medicare Other | Admitting: Family

## 2018-09-13 ENCOUNTER — Other Ambulatory Visit: Payer: Self-pay

## 2018-09-13 VITALS — BP 173/77 | HR 64 | Resp 20 | Wt 151.0 lb

## 2018-09-13 DIAGNOSIS — M4644 Discitis, unspecified, thoracic region: Secondary | ICD-10-CM | POA: Diagnosis not present

## 2018-09-13 DIAGNOSIS — R600 Localized edema: Secondary | ICD-10-CM | POA: Insufficient documentation

## 2018-09-13 NOTE — Assessment & Plan Note (Signed)
Cody Mendoza has T8-T9 presumed culture negative discitis/osteomyelitis and continues to receive treatment with daptomycin and ceftriaxone. PICC line without evidence of infection and functioning appropriately. Continue current dose of daptomycin and ceftriaxone until completion date on 09/22/18. Follow up with Dr. Baxter Flattery as planned.

## 2018-09-13 NOTE — Patient Instructions (Signed)
Nice to meet you!  It looks like your edema/swelling may be coming from the daptomycin.  With no other symptoms it appears okay to continue until completion on 7/18 with transition to oral antibiotics at the time.   Keep legs elevated when seated.  If worsening of symptoms please let us know.  Continue follow up with Dr. Baxter Flattery on 09/24/18 as planned.  Have a great day and stay safe!

## 2018-09-13 NOTE — Progress Notes (Signed)
Subjective:    Patient ID: Cody Mendoza, male    DOB: 16-Jun-1921, 85 y.o.   MRN: [id number]  Chief Complaint  Patient presents with  . Leg Swelling    HPI:  Cody Mendoza is a 85 y.o. male with previous medical history of mitral insufficiency, hypertension, [name]'s syndrome, diverticulosis, atrial fibrillation, on anticoagulation who was recently hospitalized for difficulty with word finding and confusion concerning for strokelike symptoms.  Work-up ruled out acute infarct and aortic aneurysm however was found to have asymptomatic T8-T9 osteomyelitis.  Neurosurgery recommended medical management due to concern for worsening process.  He was treated with broad-spectrum therapy for suspected culture-negative discitis/osteomyelitis with daptomycin and ceftriaxone with goal of 4 weeks of IV therapy and transition to oral therapy.  IV treatment scheduled to be completed on 7/18. All hospital labs, records, and imaging reviewed in detail.   Cody Mendoza has been receiving his daptomycin and ceftriaxone with no missed doses. PICC line is functioning appropriately. He has noted since starting the IV medication that he has had increases in lower extremity edema which per his daughter is improved today. He has not shortness of breath or chest pain at present. No calf pain in either lower extremity. No fevers, chills, or sweats. Back remains asymptomatic. He is ambulating with a cane as needed for balance.  No Known Allergies    Outpatient Medications Prior to Visit  Medication Sig Dispense Refill  . carvedilol (COREG) 3.125 MG tablet Take 1 tablet (3.125 mg total) by mouth 2 (two) times daily with a meal. 60 tablet 0  . cefTRIAXone (ROCEPHIN) IVPB Inject 2 g into the vein daily for 25 days. Indication:  Thoracic osteomyelitis and discitis Last Day of Therapy:  09/21/2018 Labs - Once weekly:  CBC/D and BMP, Labs - Every other week:  ESR and CRP 25 Units 0  . Cyanocobalamin (VITAMIN B12)  500 MCG TABS Take 500 mcg by mouth every morning.     . dabigatran (PRADAXA) 150 MG CAPS Take 1 capsule (150 mg total) by mouth every 12 (twelve) hours. 60 capsule 5  . daptomycin (CUBICIN) IVPB Inject 500 mg into the vein daily for 25 days. Indication:  Thoracic osteomyelitis and discitis  Last Day of Therapy:  09/21/2018 Labs - Once weekly:  CBC/D, BMP, and CPK Labs - Every other week:  ESR and CRP 25 Units 0  . doxazosin (CARDURA) 4 MG tablet Take 0.5 tablets (2 mg total) by mouth daily. 30 tablet 0  . Lactobacillus (PROBIOTIC ACIDOPHILUS PO) Take 1 capsule by mouth daily with supper.     . latanoprost (XALATAN) 0.005 % ophthalmic solution Place 1 drop into both eyes at bedtime.   11  . losartan (COZAAR) 100 MG tablet Take 50 mg by mouth 2 (two) times daily.     . Multiple Vitamin (MULTIVITAMIN WITH MINERALS) TABS Take 1 tablet by mouth daily with supper.     . Multiple Vitamins-Minerals (ICAPS AREDS FORMULA PO) Take 1 capsule by mouth 2 (two) times daily with breakfast and lunch.     . pantoprazole (PROTONIX) 40 MG tablet Take 40 mg by mouth every morning.     [name] Glycol-Propyl Glycol (SYSTANE) 0.4-0.3 % SOLN Place 1 drop into both eyes at bedtime.      No facility-administered medications prior to visit.      Past Medical History:  Diagnosis Date  . AF (atrial fibrillation) ([place])   . Asthma   . Detached retina   .  Discitis of thoracic region 08/2018  . Diverticulosis   . GERD (gastroesophageal reflux disease)   . [name] syndrome   . Hypertension   . Lactose intolerance   . Mitral insufficiency   . Renal calculi   . TIA (transient ischemic attack)       Past Surgical History:  Procedure Laterality Date  . cataract surgery    . ESOPHAGEAL DILATION     In the past  . FINGER TENDON REPAIR    . INGUINAL HERNIA REPAIR    . KIDNEY STONE SURGERY     Status post syrgical removal of a   . TONSILLECTOMY        Family History  Problem Relation Age of Onset  .  Hypertension Mother       Social History   Socioeconomic History  . Marital status: Married    Spouse name: Not on file  . Number of children: 2  . Years of education: Not on file  . Highest education level: Not on file  Occupational History  . Occupation: Careers adviser: RETIRED  Social Needs  . Financial resource strain: Not on file  . Food insecurity    Worry: Not on file    Inability: Not on file  . Transportation needs    Medical: Not on file    Non-medical: Not on file  Tobacco Use  . Smoking status: Never Smoker  . Smokeless tobacco: Never Used  Substance and Sexual Activity  . Alcohol use: Yes    Alcohol/week: 0.0 standard drinks    Comment: rarely  . Drug use: Never  . Sexual activity: Not on file  Lifestyle  . Physical activity    Days per week: Not on file    Minutes per session: Not on file  . Stress: Not on file  Relationships  . Social Herbalist on phone: Not on file    Gets together: Not on file    Attends religious service: Not on file    Active member of club or organization: Not on file    Attends meetings of clubs or organizations: Not on file    Relationship status: Not on file  . Intimate partner violence    Fear of current or ex partner: Not on file    Emotionally abused: Not on file    Physically abused: Not on file    Forced sexual activity: Not on file  Other Topics Concern  . Not on file  Social History Narrative  . Not on file      Review of Systems  Constitutional: Negative for chills, fatigue, fever and unexpected weight change.  Respiratory: Negative for cough, chest tightness, shortness of breath and wheezing.   Cardiovascular: Positive for leg swelling. Negative for chest pain.  Gastrointestinal: Negative for constipation, diarrhea, nausea and vomiting.  Musculoskeletal: Negative for back pain.       Objective:    BP (!) 173/77   Pulse 64   Resp 20   Wt 151 lb (68.5 kg)   SpO2  97%   BMI 23.65 kg/m  Nursing note and vital signs reviewed.  Physical Exam Constitutional:      General: He is not in acute distress.    Appearance: He is well-developed.     Comments: Seated in the chair; pleasant.   Cardiovascular:     Rate and Rhythm: Normal rate. Rhythm regularly irregular.     Heart sounds: Normal heart sounds.  Comments: Bilateral 1-2+ pitting lower extremity edema. No tenderness. Pulses present.  Pulmonary:     Effort: Pulmonary effort is normal.     Breath sounds: Normal breath sounds.  Skin:    General: Skin is warm and dry.  Neurological:     Mental Status: He is alert and oriented to person, place, and time.  Psychiatric:        Behavior: Behavior normal.        Thought Content: Thought content normal.        Judgment: Judgment normal.         Assessment & Plan:   Problem List Items Addressed This Visit      Musculoskeletal and Integument   Discitis of thoracic region - Primary (Chronic)    Mr. Howton has T8-T9 presumed culture negative discitis/osteomyelitis and continues to receive treatment with daptomycin and ceftriaxone. PICC line without evidence of infection and functioning appropriately. Continue current dose of daptomycin and ceftriaxone until completion date on 09/22/18. Follow up with Dr. Baxter Flattery as planned.         Other   Lower extremity edema    Mr. Oquinn has new onset bilateral lower extremity edema since starting IV antibiotics. This is likely related to the Daptomycin as it is a known side effect in about 7% of the population. He does not have any shortness of breath, adventitious lung sounds, or signs of volume overload at present. This will likely improve with the completion of Daptomycin. Discussed elevating legs and continuing to monitor. If worsening shortness of breath or edema will consider changing daptomycin to doxycycline prior to 09/22/18. Follow up with Dr. Baxter Flattery as planned on 09/24/18.           I am  having [place] maintain his pantoprazole, dabigatran, multivitamin with minerals, Multiple Vitamins-Minerals (ICAPS AREDS FORMULA PO), Polyethyl Glycol-Propyl Glycol, latanoprost, Lactobacillus (PROBIOTIC ACIDOPHILUS PO), losartan, Vitamin B12, daptomycin, cefTRIAXone, carvedilol, and doxazosin.   Follow-up: Return in about 2 weeks (around 09/27/2018), or if symptoms worsen or fail to improve.    Terri Piedra, MSN, FNP-C Nurse Practitioner [facility] Office phone: [phone] Pager: [place] number: [phone]

## 2018-09-13 NOTE — Assessment & Plan Note (Signed)
Cody Mendoza has new onset bilateral lower extremity edema since starting IV antibiotics. This is likely related to the Daptomycin as it is a known side effect in about 7% of the population. He does not have any shortness of breath, adventitious lung sounds, or signs of volume overload at present. This will likely improve with the completion of Daptomycin. Discussed elevating legs and continuing to monitor. If worsening shortness of breath or edema will consider changing daptomycin to doxycycline prior to 09/22/18. Follow up with Cody Mendoza as planned on 09/24/18.

## 2018-09-14 NOTE — Progress Notes (Signed)
Cody Mendoza Date of Birth: Feb 14, 1922   History of Present Illness: Cody Mendoza is seen today for followup of  chronic atrial fibrillation. He is managed with rate control and  anticoagulation with Pradaxa.   He was admitted 6/18-6/22/20 with a TIA. He presented to [facility] on 08/23/18 with transient speech difficulty and inability to operate the telephone. His wife called EMS and CODE stroke was activated but imaging negative for acute stroke. CXR showed incidental possible thoracic aortic aneurysm, followed CTA of chest and abdomen, which showed no aortic aneurysm or dissection, but did show T8-T9 osteomyelitis and discitis. This was confirmed by MRI of thoracic spine. Neurosurgery was consulted but pt declined surgery. He was treated with antibiotics. During that admission he was seen in Consultation by Crissie Sickles for evaluation of NSVT 39 beats versus aberrancy. This was asymptomatic. Echo showed normal EF. Observation recommended. Noted to have some slow as well as elevated HR with Afib. On low dose Coreg. Not felt to be a candidate for pacemaker due to the fact he was asymptomatic and had an active infection. Monitoring of symptoms recommended. He returned the same day as his DC with ? Syncope and left sided weakness. According to EMS BP was low at 90 systolic. On arrival to hospital it was 200/100. Admitted until 6/24 then DC on medical therapy.   On follow up today he is still on IV antibiotics. He complains of feeling "woozy". Denies chest pain or SOB but notes some wheezing at times. Fell on his shoulder last week and it is sore. No syncope. No palpitations.    Current Outpatient Medications on File Prior to Visit  Medication Sig Dispense Refill  . carvedilol (COREG) 3.125 MG tablet Take 1 tablet (3.125 mg total) by mouth 2 (two) times daily with a meal. 60 tablet 0  . cefTRIAXone (ROCEPHIN) IVPB Inject 2 g into the vein daily for 25 days. Indication:  Thoracic osteomyelitis and  discitis Last Day of Therapy:  09/21/2018 Labs - Once weekly:  CBC/D and BMP, Labs - Every other week:  ESR and CRP 25 Units 0  . Cyanocobalamin (VITAMIN B12) 500 MCG TABS Take 500 mcg by mouth every morning.     . daptomycin (CUBICIN) IVPB Inject 500 mg into the vein daily for 25 days. Indication:  Thoracic osteomyelitis and discitis  Last Day of Therapy:  09/21/2018 Labs - Once weekly:  CBC/D, BMP, and CPK Labs - Every other week:  ESR and CRP 25 Units 0  . doxazosin (CARDURA) 4 MG tablet Take 0.5 tablets (2 mg total) by mouth daily. 30 tablet 0  . Lactobacillus (PROBIOTIC ACIDOPHILUS PO) Take 1 capsule by mouth daily with supper.     . latanoprost (XALATAN) 0.005 % ophthalmic solution Place 1 drop into both eyes at bedtime.   11  . losartan (COZAAR) 100 MG tablet Take 50 mg by mouth 2 (two) times daily.     . Multiple Vitamin (MULTIVITAMIN WITH MINERALS) TABS Take 1 tablet by mouth daily with supper.     . Multiple Vitamins-Minerals (ICAPS AREDS FORMULA PO) Take 1 capsule by mouth 2 (two) times daily with breakfast and lunch.     . pantoprazole (PROTONIX) 40 MG tablet Take 40 mg by mouth every morning.     [name] Glycol-Propyl Glycol (SYSTANE) 0.4-0.3 % SOLN Place 1 drop into both eyes at bedtime.      No current facility-administered medications on file prior to visit.     No Known  Allergies  Past Medical History:  Diagnosis Date  . AF (atrial fibrillation) ([place])   . Asthma   . Detached retina   . Discitis of thoracic region 08/2018  . Diverticulosis   . GERD (gastroesophageal reflux disease)   . [name] syndrome   . Hypertension   . Lactose intolerance   . Mitral insufficiency   . Renal calculi   . TIA (transient ischemic attack)     Past Surgical History:  Procedure Laterality Date  . cataract surgery    . ESOPHAGEAL DILATION     In the past  . FINGER TENDON REPAIR    . INGUINAL HERNIA REPAIR    . KIDNEY STONE SURGERY     Status post syrgical removal of a   .  TONSILLECTOMY      Social History   Tobacco Use  Smoking Status Never Smoker  Smokeless Tobacco Never Used    Social History   Substance and Sexual Activity  Alcohol Use Yes  . Alcohol/week: 0.0 standard drinks   Comment: rarely    Family History  Problem Relation Age of Onset  . Hypertension Mother     Review of Systems: As noted in history of present illness. All other systems were reviewed and are negative.  Physical Exam: BP (!) 152/90 (BP Location: Left Arm, Patient Position: Sitting, Cuff Size: Normal)   Pulse 70   Temp 98.4 F (36.9 C)   Ht 5' 6"  (1.676 m)   Wt 157 lb (71.2 kg)   BMI 25.34 kg/m  GENERAL:  Well appearing, elderly WM in NAD HEENT:  PERRL, EOMI, sclera are clear. Oropharynx is clear. NECK:  No jugular venous distention, carotid upstroke brisk and symmetric, no bruits, no thyromegaly or adenopathy LUNGS:  Clear to auscultation bilaterally CHEST:  Unremarkable HEART:  IRRR,  PMI not displaced or sustained,S1 and S2 within normal limits, no S3, no S4: no clicks, no rubs, no murmurs ABD:  Soft, nontender. BS +, no masses or bruits. No hepatomegaly, no splenomegaly EXT:  2 + pulses throughout, 1+ edema, no cyanosis no clubbing SKIN:  Warm and dry.  No rashes NEURO:  Alert and oriented x 3. Cranial nerves II through XII intact. PSYCH:  Cognitively intact    LABORATORY DATA: Lab Results  Component Value Date   WBC 8.5 08/30/2018   HGB 10.0 (L) 08/30/2018   HCT 29.9 (L) 08/30/2018   PLT 201 08/30/2018   GLUCOSE 111 (H) 08/30/2018   CHOL 137 08/28/2018   TRIG 34 08/28/2018   HDL 60 08/28/2018   LDLCALC 70 08/28/2018   ALT 23 08/30/2018   AST 31 08/30/2018   NA 129 (L) 08/30/2018   K 3.9 08/30/2018   CL 98 08/30/2018   CREATININE 1.35 (H) 08/30/2018   BUN 30 (H) 08/30/2018   CO2 22 08/30/2018   INR 1.7 (H) 08/27/2018   HGBA1C 5.0 08/28/2018   Labs dated 08/05/15: cholesterol 141, triglycerides 31, LDL 84, HDL 51. Chemistries, TSH, Hct  normal Dated 11/13/15: BUN 17, creatinine 1.1. Dated 7/19/118: cholesterol 132, triglycerides 28, HDL 57, LDL 69.  Dated 01/25/17: Hgb 11.1, creatinine 1.2. TSH normal.   Echo 08/26/18: IMPRESSIONS    1. The left ventricle has normal systolic function with an ejection fraction of 60-65%. The cavity size was normal. There is mildly increased left ventricular wall thickness. Left ventricular diastolic Doppler parameters are consistent with impaired  relaxation.  2. The right ventricle has normal systolic function. The cavity was normal. There is  no increase in right ventricular wall thickness.  3. Left atrial size was moderately dilated.  4. Right atrial size was mildly dilated.  5. The aortic valve is tricuspid. Aortic valve regurgitation is trivial by color flow Doppler. No stenosis of the aortic valve.  Assessment / Plan: 1. Atrial fibrillation. Chronic, persistent. Previously rate  controlled on no AV nodal blockers. Now on low dose Coreg. Some bradycardia noted in the hospital but review of diary at home shows HR 62-77. Notes he can get Eliquis much cheaper than Pradaxa so we will switch with his next prescription 5 mg bid.    2. HTN. Labile. Review of home diary shows systolic BP 443-246 with normal diastolic readings. On Cardura, losartan and Coreg. I am reluctant to increase further with history of BP dropping and his gait imbalance.   3. NSVT versus aberrancy. Asymptomatic on low dose Coreg.  4. T8-9 discitis/osteomyelitis. On IV antibiotics.

## 2018-09-17 ENCOUNTER — Telehealth: Payer: Self-pay

## 2018-09-17 ENCOUNTER — Ambulatory Visit (INDEPENDENT_AMBULATORY_CARE_PROVIDER_SITE_OTHER): Payer: Medicare Other | Admitting: Cardiology

## 2018-09-17 ENCOUNTER — Encounter: Payer: Self-pay | Admitting: Cardiology

## 2018-09-17 ENCOUNTER — Other Ambulatory Visit: Payer: Self-pay

## 2018-09-17 VITALS — BP 152/90 | HR 70 | Temp 98.4°F | Ht 66.0 in | Wt 157.0 lb

## 2018-09-17 DIAGNOSIS — I482 Chronic atrial fibrillation, unspecified: Secondary | ICD-10-CM

## 2018-09-17 DIAGNOSIS — I10 Essential (primary) hypertension: Secondary | ICD-10-CM

## 2018-09-17 DIAGNOSIS — I495 Sick sinus syndrome: Secondary | ICD-10-CM | POA: Diagnosis not present

## 2018-09-17 MED ORDER — APIXABAN 5 MG PO TABS: 5.0000 mg | ORAL_TABLET | Freq: Two times a day (BID) | ORAL | 3 refills | Status: DC

## 2018-09-17 NOTE — Telephone Encounter (Signed)
Received call from [facility]. [place] needs clarification to pull picc at completion of therapy and if antibiotics need to start on 7/18. Notes states to start Doxy after completion of IV therapy. Routing to provider for clarification.  Eugenia Mcalpine, LPN

## 2018-09-17 NOTE — Patient Instructions (Signed)
With your next prescription we will switch from Pradaxa to Eliquis 5 mg twice a day  Continue your other therapy  Follow up in 6 months.

## 2018-09-18 NOTE — Telephone Encounter (Signed)
HH will make patient aware of extended orders.  I have called advance to make them aware. Spoke with Corette and gave V.O to extend IV antibiotic therapy until 7/20. Order read back and understood.  Eugenia Mcalpine, LPN

## 2018-09-18 NOTE — Telephone Encounter (Signed)
To prevent complication, please have [facility] extend treatment 2 more days until seen by Dr. Baxter Flattery on 7/20. We can remove the PICC in the office or determine if it needs to continue at that time.

## 2018-09-21 ENCOUNTER — Other Ambulatory Visit (HOSPITAL_COMMUNITY): Payer: Self-pay | Admitting: *Deleted

## 2018-09-24 ENCOUNTER — Ambulatory Visit (INDEPENDENT_AMBULATORY_CARE_PROVIDER_SITE_OTHER): Payer: Medicare Other | Admitting: Internal Medicine

## 2018-09-24 ENCOUNTER — Ambulatory Visit (HOSPITAL_COMMUNITY)
Admission: RE | Admit: 2018-09-24 | Discharge: 2018-09-24 | Disposition: A | Discharge status: Home / Self Care | Payer: Medicare Other | Source: Ambulatory Visit | Attending: Internal Medicine | Admitting: Internal Medicine

## 2018-09-24 ENCOUNTER — Ambulatory Visit
Admission: RE | Admit: 2018-09-24 | Discharge: 2018-09-24 | Disposition: A | Discharge status: Home / Self Care | Payer: Medicare Other | Source: Ambulatory Visit | Attending: Internal Medicine | Admitting: Internal Medicine

## 2018-09-24 ENCOUNTER — Other Ambulatory Visit: Payer: Self-pay

## 2018-09-24 VITALS — BP 198/75 | HR 86 | Temp 98.5°F | Wt 154.0 lb

## 2018-09-24 DIAGNOSIS — M4644 Discitis, unspecified, thoracic region: Secondary | ICD-10-CM | POA: Diagnosis not present

## 2018-09-24 DIAGNOSIS — R600 Localized edema: Secondary | ICD-10-CM

## 2018-09-24 DIAGNOSIS — J82 Pulmonary eosinophilia, not elsewhere classified: Secondary | ICD-10-CM

## 2018-09-24 DIAGNOSIS — T50905A Adverse effect of unspecified drugs, medicaments and biological substances, initial encounter: Secondary | ICD-10-CM

## 2018-09-24 DIAGNOSIS — J8281 Chronic eosinophilic pneumonia: Secondary | ICD-10-CM

## 2018-09-24 DIAGNOSIS — D649 Anemia, unspecified: Secondary | ICD-10-CM | POA: Insufficient documentation

## 2018-09-24 MED ORDER — DOXYCYCLINE HYCLATE 100 MG PO TABS: 100.0000 mg | ORAL_TABLET | Freq: Two times a day (BID) | ORAL | 2 refills | Status: DC

## 2018-09-24 MED ORDER — SODIUM CHLORIDE 0.9 % IV SOLN
510.0000 mg | INTRAVENOUS | Status: DC
  Administered 2018-09-24: 510 mg via INTRAVENOUS
  Filled 2018-09-24: qty 17

## 2018-09-24 NOTE — Progress Notes (Signed)
Per Dr Baxter Flattery  38 cm  Single lumen Peripherally Inserted Central Catheter  removed from right basilic . No sutures present. Dressing was clean and dry . Excessive bleeding present with removal which stopped with pressure applied for 3 minutes until clotted.  Area cleansed with chlorhexidine and petroleum dressing applied. Pt advised no heavy lifting with this arm, leave dressing for 24 hours and call the office if dressing becomes soaked with blood or sharp pain presents.  Pt tolerated procedure well.    Laverle Patter, RN

## 2018-09-24 NOTE — Discharge Instructions (Signed)

## 2018-09-24 NOTE — Progress Notes (Signed)
Patient ID: Cody Mendoza, male   DOB: 1921/06/28, 85 y.o.   MRN: [id number]  HPI 85yo M with presumed thoracic discitis, will be empirically treated with daptomycin plus ceftriaxone x 4 wk then convert to oral abtx of doxycycline plus repeat imaging.using 6/20 as day 1, through July 18th. He was seen last week for LE swelling.  Outpatient Encounter Medications as of 09/24/2018  Medication Sig  . carvedilol (COREG) 3.125 MG tablet Take 1 tablet (3.125 mg total) by mouth 2 (two) times daily with a meal.  . cefTRIAXone (ROCEPHIN) 2 g injection   . Cyanocobalamin (VITAMIN B12) 500 MCG TABS Take 500 mcg by mouth every morning.   Marland Kitchen DAPTOmycin (CUBICIN) 500 MG injection   . doxazosin (CARDURA) 4 MG tablet Take 0.5 tablets (2 mg total) by mouth daily.  Marland Kitchen latanoprost (XALATAN) 0.005 % ophthalmic solution Place 1 drop into both eyes at bedtime.   Marland Kitchen losartan (COZAAR) 100 MG tablet Take 50 mg by mouth 2 (two) times daily.   . Multiple Vitamin (MULTIVITAMIN WITH MINERALS) TABS Take 1 tablet by mouth daily with supper.   . Multiple Vitamins-Minerals (ICAPS AREDS FORMULA PO) Take 1 capsule by mouth 2 (two) times daily with breakfast and lunch.   . pantoprazole (PROTONIX) 40 MG tablet Take 40 mg by mouth every morning.   Marland Kitchen apixaban (ELIQUIS) 5 MG TABS tablet Take 1 tablet (5 mg total) by mouth 2 (two) times daily. (Patient not taking: Reported on 09/24/2018)  . Lactobacillus (PROBIOTIC ACIDOPHILUS PO) Take 1 capsule by mouth daily with supper.   Cody Mendoza Glycol-Propyl Glycol (SYSTANE) 0.4-0.3 % SOLN Place 1 drop into both eyes at bedtime.    No facility-administered encounter medications on file as of 09/24/2018.      Patient Active Problem List   Diagnosis Date Noted  . Lower extremity edema 09/13/2018  . Near syncope 08/28/2018  . Hyponatremia 08/28/2018  . TIA (transient ischemic attack) 08/27/2018  . Discitis of thoracic region 08/23/2018  . CKD (chronic kidney disease) stage 2, GFR  60-89 ml/min 08/23/2018  . Chronic anemia 08/23/2018  . Discitis 08/23/2018  . Puncture wound of right lower leg without foreign body 10/08/2013  . Mitral insufficiency 12/28/2010  . Atrial fibrillation ([place]) 11/16/2010  . Hypertension   . GERD 04/22/2010  . PERSONAL HX COLONIC POLYPS 04/22/2010     Health Maintenance Due  Topic Date Due  . PNA vac Low Risk Adult (1 of 2 - PCV13) 01/08/1989     Review of Systems 12 point ros is negative, except what is mentioned on hpi Physical Exam   BP (!) 198/75   Pulse 86   Temp 98.5 F (36.9 C) (Oral)   Wt 154 lb (69.9 kg)   BMI 24.86 kg/m    Physical Exam  Constitutional: He is oriented to person, place, and time. He appears well-developed and well-nourished. No distress.  HENT:  Mouth/Throat: Oropharynx is clear and moist. No oropharyngeal exudate.  Cardiovascular: Normal rate, regular rhythm and normal heart sounds. Exam reveals no gallop and no friction rub.  No murmur heard.  Pulmonary/Chest: Effort normal and breath sounds normal. No respiratory distress. He has no wheezes.  Abdominal: Soft. Bowel sounds are normal. He exhibits no distension. There is no tenderness.  Lymphadenopathy:  He has no cervical adenopathy.  Neurological: He is alert and oriented to person, place, and time.  Skin: Skin is warm and dry. No rash noted. No erythema.  Psychiatric: He has a normal  mood and affect. His behavior is normal.    CBC Lab Results  Component Value Date   WBC 8.5 08/30/2018   RBC 3.22 (L) 08/30/2018   HGB 10.0 (L) 08/30/2018   HCT 29.9 (L) 08/30/2018   PLT 201 08/30/2018   MCV 92.9 08/30/2018   MCH 31.1 08/30/2018   MCHC 33.4 08/30/2018   RDW 12.4 08/30/2018   LYMPHSABS 0.9 08/30/2018   MONOABS 1.1 (H) 08/30/2018   EOSABS 0.2 08/30/2018    BMET Lab Results  Component Value Date   NA 129 (L) 08/30/2018   K 3.9 08/30/2018   CL 98 08/30/2018   CO2 22 08/30/2018   GLUCOSE 111 (H) 08/30/2018   BUN 30 (H) 08/30/2018    CREATININE 1.35 (H) 08/30/2018   CALCIUM 9.0 08/30/2018   GFRNONAA 45 (L) 08/30/2018   GFRAA 52 (L) 08/30/2018      Assessment and Plan  Shortness of breath = concern for eos pneumonia associated with daptomycin. Will check cdc with diff and cxr. Stopped dapto today. I have reviewed cxr has mild patchy infiltrate. No hypoxia. Will determine if need to give steroids, appears only mildly symptomatic, will see if cessation of medication is sufficient  Osteomyelitis = will check sed rate and crp, cbc and bmp. Will get repeat mri in 3-4 wk. Changed over to doxycycline. Gave precautions with meds

## 2018-09-25 LAB — BASIC METABOLIC PANEL
BUN/Creatinine Ratio: 20 (calc) (ref 6–22)
BUN: 23 mg/dL (ref 7–25)
CO2: 28 mmol/L (ref 20–32)
Calcium: 8.7 mg/dL (ref 8.6–10.3)
Chloride: 94 mmol/L — ABNORMAL LOW (ref 98–110)
Creat: 1.13 mg/dL — ABNORMAL HIGH (ref 0.70–1.11)
Glucose, Bld: 125 mg/dL — ABNORMAL HIGH (ref 65–99)
Potassium: 4.1 mmol/L (ref 3.5–5.3)
Sodium: 128 mmol/L — ABNORMAL LOW (ref 135–146)

## 2018-09-25 LAB — CBC WITH DIFFERENTIAL/PLATELET
Absolute Monocytes: 1188 cells/uL — ABNORMAL HIGH (ref 200–950)
Basophils Absolute: 75 cells/uL (ref 0–200)
Basophils Relative: 0.7 %
Eosinophils Absolute: 642 cells/uL — ABNORMAL HIGH (ref 15–500)
Eosinophils Relative: 6 %
HCT: 26.6 % — ABNORMAL LOW (ref 38.5–50.0)
Hemoglobin: 8.8 g/dL — ABNORMAL LOW (ref 13.2–17.1)
Lymphs Abs: 589 cells/uL — ABNORMAL LOW (ref 850–3900)
MCH: 30.2 pg (ref 27.0–33.0)
MCHC: 33.1 g/dL (ref 32.0–36.0)
MCV: 91.4 fL (ref 80.0–100.0)
MPV: 9.1 fL (ref 7.5–12.5)
Monocytes Relative: 11.1 %
Neutro Abs: 8207 cells/uL — ABNORMAL HIGH (ref 1500–7800)
Neutrophils Relative %: 76.7 %
Platelets: 315 10*3/uL (ref 140–400)
RBC: 2.91 10*6/uL — ABNORMAL LOW (ref 4.20–5.80)
RDW: 12.2 % (ref 11.0–15.0)
Total Lymphocyte: 5.5 %
WBC: 10.7 10*3/uL (ref 3.8–10.8)

## 2018-09-25 LAB — C-REACTIVE PROTEIN: CRP: 67.8 mg/L — ABNORMAL HIGH (ref <8.0)

## 2018-09-25 LAB — SEDIMENTATION RATE: Sed Rate: 45 mm/h — ABNORMAL HIGH (ref 0–20)

## 2018-10-01 ENCOUNTER — Other Ambulatory Visit: Payer: Self-pay

## 2018-10-01 ENCOUNTER — Ambulatory Visit (HOSPITAL_COMMUNITY)
Admission: RE | Admit: 2018-10-01 | Discharge: 2018-10-01 | Disposition: A | Discharge status: Home / Self Care | Payer: Medicare Other | Source: Ambulatory Visit | Attending: Internal Medicine | Admitting: Internal Medicine

## 2018-10-01 DIAGNOSIS — D649 Anemia, unspecified: Secondary | ICD-10-CM | POA: Diagnosis present

## 2018-10-01 MED ORDER — SODIUM CHLORIDE 0.9 % IV SOLN
510.0000 mg | INTRAVENOUS | Status: AC
  Administered 2018-10-01: 13:00:00 510 mg via INTRAVENOUS
  Filled 2018-10-01: qty 17

## 2018-10-03 ENCOUNTER — Ambulatory Visit (INDEPENDENT_AMBULATORY_CARE_PROVIDER_SITE_OTHER): Payer: Medicare Other | Admitting: Adult Health

## 2018-10-03 ENCOUNTER — Other Ambulatory Visit: Payer: Self-pay

## 2018-10-03 ENCOUNTER — Encounter: Payer: Self-pay | Admitting: Adult Health

## 2018-10-03 VITALS — BP 161/71 | HR 63 | Temp 97.5°F | Ht 67.0 in | Wt 143.8 lb

## 2018-10-03 DIAGNOSIS — G459 Transient cerebral ischemic attack, unspecified: Secondary | ICD-10-CM | POA: Diagnosis not present

## 2018-10-03 DIAGNOSIS — I4811 Longstanding persistent atrial fibrillation: Secondary | ICD-10-CM | POA: Diagnosis not present

## 2018-10-03 DIAGNOSIS — I10 Essential (primary) hypertension: Secondary | ICD-10-CM | POA: Diagnosis not present

## 2018-10-03 NOTE — Progress Notes (Signed)
[facility] [address] [place]. [place] [address] [phone]       HOSPITAL FOLLOW UP NOTE  Cody Mendoza Date of Birth:  1922-02-17 Medical Record Number:  [id number]   Reason for Referral:  hospital stroke follow up    CHIEF COMPLAINT:  Chief Complaint  Patient presents with   Follow-up    Treatment room, with daughter. Hospital f/u. States he has been doing fine with little bursts of energy.    HPI: Cody Mendoza being seen today for in office hospital follow-up regarding likely right brain TIA and small vessel disease on 08/27/2018.  History obtained from patient, daughter and chart review. Reviewed all radiology images and labs personally.  Cody Mendoza is a 85 y.o. male with history of A. fib on Pradaxa who developed a syncopal event, then  presented to [facility] ED on 08/27/2018 with left-sided weakness and confusion.   CT head negative for acute abnormality with evidence of small vessel disease and atrophy.  CTA head and neck negative for LVO with scattered plaque in ICAs, RVA 50% stenosis and diminutive VB system with bilateral fetal PCAs.  MRI negative for acute stroke.  2D echo normal EF with LA moderately dilated, RA moderately dilated but no cardiac source of embolus identified.  LDL 70 and A1c 5.0.  Previously on Pradaxa for atrial fibrillation and recommended continuation at discharge.  HTN initially elevated as high as 202/145 and stabilized throughout admission with recommendation of long-term BP goal normotensive range.  No indication for statin given advanced age and at goal LDL.  Other stroke risk factors include advanced age and EtOH use but no prior history of stroke.  Discharged home in stable condition.  He has been doing well from a neurological standpoint since discharge without recurring stroke/TIA symptoms.  He was evaluated in the ED the following day of discharge due to syncopal event with blood pressure 80/50 and  improvement of blood pressure prior to discharge.  Medication adjustments made with recent elevation of blood pressure levels.  Today's reading 161/71 with monitoring at home and typical SBP 150s.  He denies reoccurring syncopal events since that time.  He does endorse ongoing fatigue with recent iron infusions with improvement.  Continues to be followed with PCP for hyponatremia.  Continues on Pradaxa without bleeding or bruising.  He continues to live with his wife performing all ADLs and some assistance by his daughter with IADLs.  He is questioning potential return to driving at this time.  He does endorse underlying balance difficulties which has been ongoing.  He is currently receiving home PT/OT with some improvement.  He does continue to use a cane or rolling walker for ambulation.  No further concerns.  Denies new or worsening stroke/TIA symptoms.     ROS:   14 system review of systems performed and negative with exception of no concerns  PMH:  Past Medical History:  Diagnosis Date   AF (atrial fibrillation) (HCC)    Asthma    Detached retina    Discitis of thoracic region 08/2018   Diverticulosis    GERD (gastroesophageal reflux disease)    Rosanna Randy syndrome    Hypertension    Lactose intolerance    Mitral insufficiency    Renal calculi    TIA (transient ischemic attack)     PSH:  Past Surgical History:  Procedure Laterality Date   cataract surgery     ESOPHAGEAL DILATION     In the past  FINGER TENDON REPAIR     INGUINAL HERNIA REPAIR     KIDNEY STONE SURGERY     Status post syrgical removal of a    TONSILLECTOMY      Social History:  Social History   Socioeconomic History   Marital status: Married    Spouse name: Not on file   Number of children: 2   Years of education: Not on file   Highest education level: Not on file  Occupational History   Occupation: Careers adviser: RETIRED  Social Needs   Financial  resource strain: Not on file   Food insecurity    Worry: Not on file    Inability: Not on file   Transportation needs    Medical: Not on file    Non-medical: Not on file  Tobacco Use   Smoking status: Never Smoker   Smokeless tobacco: Never Used  Substance and Sexual Activity   Alcohol use: Yes    Alcohol/week: 0.0 standard drinks    Comment: rarely   Drug use: Never   Sexual activity: Not on file  Lifestyle   Physical activity    Days per week: Not on file    Minutes per session: Not on file   Stress: Not on file  Relationships   Social connections    Talks on phone: Not on file    Gets together: Not on file    Attends religious service: Not on file    Active member of club or organization: Not on file    Attends meetings of clubs or organizations: Not on file    Relationship status: Not on file   Intimate partner violence    Fear of current or ex partner: Not on file    Emotionally abused: Not on file    Physically abused: Not on file    Forced sexual activity: Not on file  Other Topics Concern   Not on file  Social History Narrative   Not on file    Family History:  Family History  Problem Relation Age of Onset   Hypertension Mother     Medications:   Current Outpatient Medications on File Prior to Visit  Medication Sig Dispense Refill   apixaban (ELIQUIS) 5 MG TABS tablet Take 1 tablet (5 mg total) by mouth 2 (two) times daily. 180 tablet 3   carvedilol (COREG) 3.125 MG tablet Take 1 tablet (3.125 mg total) by mouth 2 (two) times daily with a meal. 60 tablet 0   cefTRIAXone (ROCEPHIN) 2 g injection      Cyanocobalamin (VITAMIN B12) 500 MCG TABS Take 500 mcg by mouth every morning.      doxazosin (CARDURA) 4 MG tablet Take 0.5 tablets (2 mg total) by mouth daily. 30 tablet 0   doxycycline (VIBRA-TABS) 100 MG tablet Take 1 tablet (100 mg total) by mouth 2 (two) times daily. 60 tablet 2   Lactobacillus (PROBIOTIC ACIDOPHILUS PO) Take 1  capsule by mouth daily with supper.      latanoprost (XALATAN) 0.005 % ophthalmic solution Place 1 drop into both eyes at bedtime.   11   losartan (COZAAR) 100 MG tablet Take 50 mg by mouth 2 (two) times daily.      Multiple Vitamin (MULTIVITAMIN WITH MINERALS) TABS Take 1 tablet by mouth daily with supper.      Multiple Vitamins-Minerals (ICAPS AREDS FORMULA PO) Take 1 capsule by mouth 2 (two) times daily with breakfast and lunch.  pantoprazole (PROTONIX) 40 MG tablet Take 40 mg by mouth every morning.      Polyethyl Glycol-Propyl Glycol (SYSTANE) 0.4-0.3 % SOLN Place 1 drop into both eyes at bedtime.      No current facility-administered medications on file prior to visit.     Allergies:  No Known Allergies   Physical Exam  Vitals:   10/03/18 0911  BP: (!) 161/71  Pulse: 63  Temp: (!) 97.5 F (36.4 C)  Weight: 143 lb 12.8 oz (65.2 kg)  Height: 5\' 7"  (1.702 m)   Body mass index is 22.52 kg/m. No exam data present  Depression screen [facility] 2/9 10/03/2018  Decreased Interest 0  Down, Depressed, Hopeless 0  PHQ - 2 Score 0     General: well developed, well nourished,  pleasant elderly Caucasian male, seated, in no evident distress Head: head normocephalic and atraumatic.   Neck: supple with no carotid or supraclavicular bruits Cardiovascular: irregular rate and rhythm, no murmurs Musculoskeletal: no deformity Skin:  no rash/petichiae Vascular:  Normal pulses all extremities   Neurologic Exam Mental Status: Awake and fully alert. Oriented to place and time. Recent and remote memory intact. Attention span, concentration and fund of knowledge appropriate. Mood and affect appropriate.  Cranial Nerves: Fundoscopic exam reveals sharp disc margins. Pupils equal, briskly reactive to light. Extraocular movements full without nystagmus. Visual fields full to confrontation. Hearing intact. Facial sensation intact. Face, tongue, palate moves normally and symmetrically.  Motor:  Normal bulk and tone. Normal strength in all tested extremity muscles. Sensory.: intact to touch , pinprick , position and vibratory sensation.  Coordination: Rapid alternating movements normal in all extremities. Finger-to-nose and heel-to-shin performed accurately bilaterally. Gait and Station: Arises from chair without difficulty. Stance is hunched. Gait demonstrates normal stride length and balance with assistance of pain Reflexes: 1+ and symmetric. Toes downgoing.     NIHSS  0 Modified Rankin  0 CHA2DS2-VASc 4 HAS-BLED 3   Diagnostic Data (Labs, Imaging, Testing)   Code Stroke CT head No acute abnormality. Small vessel disease. Atrophy. ASPECTS 10.     CTA head & neck no LVO.  Scattered plaque in ICAs.  RVA 50%.  Diminutive VB system with B fetal PCAs  MRI no acute stroke.  Atrophy.  Small vessel disease.  2D Echo EF 60 to 65%.  LA moderately dilated.  RA mildly dilated.  No source of embolus.  LDL 70  HgbA1c 5.0   ASSESSMENT: Cody Mendoza is a 85 y.o. year old male here with right brain TIA on 08/27/2018 secondary to small vessel disease. Vascular risk factors include HTN, atrial fibrillation on Pradaxa, advanced age and EtOH use.  Has been doing well from a neurological standpoint.  He did have additional episode of syncope the day after discharge with finding of hypotension as likely etiology.  Has not had any additional episodes since that time.    PLAN:  1. TIA: Continue Pradaxa (dabigatran) twice a day for secondary stroke prevention. Maintain strict control of hypertension with blood pressure goal below 130/90, diabetes with hemoglobin A1c goal below 6.5% and cholesterol with LDL cholesterol (bad cholesterol) goal below 70 mg/dL.  I also advised the patient to eat a healthy diet with plenty of whole grains, cereals, fruits and vegetables, exercise regularly with at least 30 minutes of continuous activity daily and maintain ideal body weight. 2. HTN: Advised to  continue current treatment regimen.  Today's BP elevated.  Advised to continue to monitor at home along with continued  follow-up with PCP for management with avoidance of hypotension 3. Atrial fibrillation: Ongoing use of Pradaxa along with follow-up with cardiology for ongoing management and monitoring 4. Chronic balance difficulties: Continue home PT with potential need of outpatient referral 5. From a neurological standpoint, patient was cleared to return to driving as he does not have any residual deficits or reoccurring of symptoms.  Syncopal events are likely related to hypotension and therefore recommend obtaining clearance by PCP prior to returning.  Discussion regarding graduated return to driving with instructions provided   Follow up in 4 months or call earlier if needed   Greater than 50% of time during this 45 minute visit was spent on counseling, explanation of diagnosis of TIA, reviewing risk factor management of HTN and atrial fibrillation, planning of further management along with potential future management, and discussion with patient and family answering all questions.    Venancio Poisson, AGNP-BC  [facility] [address] [place], [place] [address]  Phone [phone] Fax [phone] Note: This document was prepared with digital dictation and possible smart phrase technology. Any transcriptional errors that result from this process are unintentional.

## 2018-10-03 NOTE — Patient Instructions (Addendum)
Continue Pradaxa (dabigatran) twice a day for secondary stroke prevention  Continue to follow up with PCP regarding blood pressure management.  Also recommend following up with your PCP regarding low sodium level  Continue to follow with cardiology for atrial fibrillation and management  Continue home PT/OT and if you are interested in additional outpatient therapies, please call office and order will be placed  From a stroke standpoint, you can return to driving as you do not have any residual deficits.  Will also be recommended for you to be cleared by your primary care provider to ensure adequate blood pressure management as syncopal events are likely related to low blood pressure and ensure satisfactory electrolyte levels.  Graduated return to driving as recommended.  It is recommended that you first drive with another licensed driver in an empty parking lot. If you do well with this, you can drive on a quiet street with the licensed driver.  If you do well with this, you can drive on a busy street with a licensed driver.  If you continue to do well, you can be cleared to drive independently.  For the first month after resuming driving, it is recommend no nighttime, busy/heavy traffic roads or Interstate driving.    Continue to monitor blood pressure at home  Maintain strict control of hypertension with blood pressure goal below 130/90, diabetes with hemoglobin A1c goal below 6.5% and cholesterol with LDL cholesterol (bad cholesterol) goal below 70 mg/dL. I also advised the patient to eat a healthy diet with plenty of whole grains, cereals, fruits and vegetables, exercise regularly and maintain ideal body weight.  Followup in the future with me in 4 months or call earlier if needed       Thank you for coming to see [place] at [facility]. I hope we have been able to provide you high quality care today.  You may receive a patient satisfaction survey over the next few weeks.  We would appreciate your feedback and comments so that we may continue to improve ourselves and the health of our patients.

## 2018-10-05 NOTE — Progress Notes (Signed)
I agree with the above plan 

## 2018-10-16 ENCOUNTER — Ambulatory Visit
Admission: RE | Admit: 2018-10-16 | Discharge: 2018-10-16 | Disposition: A | Discharge status: Home / Self Care | Payer: Medicare Other | Source: Ambulatory Visit | Attending: Internal Medicine | Admitting: Internal Medicine

## 2018-10-16 ENCOUNTER — Other Ambulatory Visit: Payer: Self-pay

## 2018-10-16 DIAGNOSIS — M4644 Discitis, unspecified, thoracic region: Secondary | ICD-10-CM

## 2018-10-16 MED ORDER — GADOBENATE DIMEGLUMINE 529 MG/ML IV SOLN
14.0000 mL | Freq: Once | INTRAVENOUS | Status: AC | PRN
  Administered 2018-10-16: 15:00:00 14 mL via INTRAVENOUS

## 2018-10-22 ENCOUNTER — Encounter: Payer: Self-pay | Admitting: Internal Medicine

## 2018-10-22 ENCOUNTER — Other Ambulatory Visit: Payer: Self-pay

## 2018-10-22 ENCOUNTER — Ambulatory Visit (INDEPENDENT_AMBULATORY_CARE_PROVIDER_SITE_OTHER): Payer: Medicare Other | Admitting: Internal Medicine

## 2018-10-22 VITALS — BP 164/85 | HR 60 | Temp 98.0°F | Wt 144.0 lb

## 2018-10-22 DIAGNOSIS — R197 Diarrhea, unspecified: Secondary | ICD-10-CM

## 2018-10-22 DIAGNOSIS — M4644 Discitis, unspecified, thoracic region: Secondary | ICD-10-CM

## 2018-10-22 DIAGNOSIS — T50905A Adverse effect of unspecified drugs, medicaments and biological substances, initial encounter: Secondary | ICD-10-CM

## 2018-10-22 NOTE — Progress Notes (Signed)
Patient ID: Cody Mendoza, male   DOB: 28-Dec-1921, 85 y.o.   MRN: [id number]  HPI [name] is a 85 yo M who was treated for discitis, and finished abtx therapy. 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 apixaban (ELIQUIS) 5 MG TABS tablet Take 1 tablet (5 mg total) by mouth 2 (two) times daily.  . carvedilol (COREG) 3.125 MG tablet Take 1 tablet (3.125 mg total) by mouth 2 (two) times daily with a meal.  . cefTRIAXone (ROCEPHIN) 2 g injection   . Cyanocobalamin (VITAMIN B12) 500 MCG TABS Take 500 mcg by mouth every morning.   Marland Kitchen doxazosin (CARDURA) 4 MG tablet Take 0.5 tablets (2 mg total) by mouth daily.  Marland Kitchen doxycycline (VIBRA-TABS) 100 MG tablet Take 1 tablet (100 mg total) by mouth 2 (two) times daily.  . Lactobacillus (PROBIOTIC ACIDOPHILUS PO) Take 1 capsule by mouth daily with supper.   . latanoprost (XALATAN) 0.005 % ophthalmic solution Place 1 drop into both eyes at bedtime.   Marland Kitchen losartan (COZAAR) 100 MG tablet Take 50 mg by mouth 2 (two) times daily.   . Multiple Vitamin (MULTIVITAMIN WITH MINERALS) TABS Take 1 tablet by mouth daily with supper.   . Multiple Vitamins-Minerals (ICAPS AREDS FORMULA PO) Take 1 capsule by mouth 2 (two) times daily with breakfast and lunch.   . pantoprazole (PROTONIX) 40 MG tablet Take 40 mg by mouth every morning.   [name] Glycol-Propyl Glycol (SYSTANE) 0.4-0.3 % SOLN Place 1 drop into both eyes at bedtime.    No facility-administered encounter medications on file as of 10/22/2018.      Patient Active Problem List   Diagnosis Date Noted  . Lower extremity edema 09/13/2018  . Near syncope 08/28/2018  . Hyponatremia  08/28/2018  . TIA (transient ischemic attack) 08/27/2018  . Discitis of thoracic region 08/23/2018  . CKD (chronic kidney disease) stage 2, GFR 60-89 ml/min 08/23/2018  . Chronic anemia 08/23/2018  . Discitis 08/23/2018  . Puncture wound of right lower leg without foreign body 10/08/2013  . Mitral insufficiency 12/28/2010  . Atrial fibrillation ([place]) 11/16/2010  . Hypertension   . GERD 04/22/2010  . PERSONAL HX COLONIC POLYPS 04/22/2010     Health Maintenance Due  Topic Date Due  . PNA vac Low Risk Adult (1 of 2 - PCV13) 01/08/1989  . INFLUENZA VACCINE  10/06/2018     Review of Systems 12 point ros is negative except what is mentioned above Physical Exam   BP (!) 164/85   Pulse 60   Temp 98 F (36.7 C)   Wt 144 lb (65.3 kg)   BMI 22.55 kg/m    Physical Exam  Constitutional: He is oriented to person, place, and time. He appears well-developed and well-nourished. No distress.  HENT:  Mouth/Throat: Oropharynx is clear and moist. No oropharyngeal exudate.  Cardiovascular: Normal rate, regular rhythm and normal heart sounds. Exam reveals no gallop and no friction rub.  No murmur heard.  Pulmonary/Chest: Effort normal and breath sounds normal. No respiratory distress. He has no wheezes.  Abdominal: Soft. Bowel sounds are normal. He exhibits no distension. There is no tenderness.  Lymphadenopathy:  He has no cervical adenopathy.  Neurological: He is alert and oriented to person, place, and time.  Skin: Skin is warm and dry. No rash noted. No erythema.  Psychiatric: He has a normal mood and affect. His behavior is normal.    CBC Lab Results  Component Value Date   WBC 10.7 09/24/2018   RBC 2.91 (L) 09/24/2018   HGB 8.8 (L) 09/24/2018   HCT 26.6 (L) 09/24/2018   PLT 315 09/24/2018   MCV 91.4 09/24/2018   MCH 30.2 09/24/2018   MCHC 33.1 09/24/2018   RDW 12.2 09/24/2018   LYMPHSABS 589 (L) 09/24/2018   MONOABS 1.1 (H) 08/30/2018   EOSABS 642 (H) 09/24/2018     BMET Lab Results  Component Value Date   NA 128 (L) 09/24/2018   K 4.1 09/24/2018   CL 94 (L) 09/24/2018   CO2 28 09/24/2018   GLUCOSE 125 (H) 09/24/2018   BUN 23 09/24/2018   CREATININE 1.13 (H) 09/24/2018   CALCIUM 8.7 09/24/2018   GFRNONAA 45 (L) 08/30/2018   GFRAA 52 (L) 08/30/2018    Lab Results  Component Value Date   ESRSEDRATE 11 10/22/2018   Lab Results  Component Value Date   CRP 1.1 10/22/2018     Assessment and Plan  History of discitis:  - will check with radiology to see if worth sampling - check inflammatory markers (which are normalized)  - had dapto eosinophilic pna - will list daptomycin as allergy  - check for cdiff if diarreha continue

## 2018-10-23 ENCOUNTER — Other Ambulatory Visit: Payer: Medicare Other

## 2018-10-23 DIAGNOSIS — R197 Diarrhea, unspecified: Secondary | ICD-10-CM

## 2018-10-23 LAB — C-REACTIVE PROTEIN: CRP: 1.1 mg/L (ref <8.0)

## 2018-10-23 LAB — SEDIMENTATION RATE: Sed Rate: 11 mm/h (ref 0–20)

## 2018-10-24 LAB — C. DIFFICILE GDH AND TOXIN A/B
GDH ANTIGEN: NOT DETECTED
MICRO NUMBER:: 784995
SPECIMEN QUALITY:: ADEQUATE
TOXIN A AND B: NOT DETECTED

## 2018-10-29 DIAGNOSIS — B351 Tinea unguium: Secondary | ICD-10-CM | POA: Insufficient documentation

## 2018-11-19 ENCOUNTER — Ambulatory Visit (INDEPENDENT_AMBULATORY_CARE_PROVIDER_SITE_OTHER): Payer: Medicare Other | Admitting: Internal Medicine

## 2018-11-19 ENCOUNTER — Other Ambulatory Visit: Payer: Self-pay

## 2018-11-19 VITALS — BP 145/67 | HR 62 | Temp 98.1°F

## 2018-11-19 DIAGNOSIS — M545 Low back pain, unspecified: Secondary | ICD-10-CM

## 2018-11-19 DIAGNOSIS — M4644 Discitis, unspecified, thoracic region: Secondary | ICD-10-CM | POA: Diagnosis not present

## 2018-11-19 DIAGNOSIS — E878 Other disorders of electrolyte and fluid balance, not elsewhere classified: Secondary | ICD-10-CM | POA: Diagnosis not present

## 2018-11-19 NOTE — Progress Notes (Signed)
RFV: follow up for discitis/thoracic osteo  Patient ID: Cody Mendoza, male   DOB: 02/14/1922, 85 y.o.   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 He is tolerating abtx-- however Starting to notice his equlibrium being off -- "it feels like I have had too much to drink" - since starting carvedilol  The patient also had different medication changes in terms of bp/cad  Lab Results  Component Value Date   ESRSEDRATE 11 10/22/2018   Lab Results  Component Value Date   CRP 1.1 10/22/2018     Outpatient Encounter Medications as of 11/19/2018  Medication Sig  . apixaban (ELIQUIS) 5 MG TABS tablet Take 1 tablet (5 mg total) by mouth 2 (two) times daily.  . carvedilol (COREG) 3.125 MG tablet Take 1 tablet (3.125 mg total) by mouth 2 (two) times daily with a meal.  . Cyanocobalamin (VITAMIN B12) 500 MCG TABS Take 500 mcg by mouth every morning.   Marland Kitchen doxazosin (CARDURA) 4 MG tablet Take 0.5 tablets (2 mg total) by mouth daily.  Marland Kitchen doxycycline (VIBRA-TABS) 100 MG tablet Take 1 tablet (100 mg total) by mouth 2 (two) times daily.  . Lactobacillus (PROBIOTIC ACIDOPHILUS PO) Take 1 capsule by mouth daily with supper.   . latanoprost (XALATAN) 0.005 % ophthalmic solution Place 1 drop into both eyes at bedtime.   Marland Kitchen losartan (COZAAR) 100 MG tablet Take 50 mg by mouth 2 (two) times daily.   . Multiple Vitamin (MULTIVITAMIN WITH MINERALS) TABS Take 1 tablet by mouth daily with supper.   . Multiple Vitamins-Minerals (ICAPS AREDS FORMULA PO) Take 1 capsule by mouth 2 (two) times daily with breakfast and lunch.   . pantoprazole (PROTONIX) 40 MG tablet Take 40 mg by mouth every morning.   [name] Glycol-Propyl Glycol (SYSTANE) 0.4-0.3 % SOLN Place 1 drop into both eyes at bedtime.   . cefTRIAXone (ROCEPHIN) 2 g injection    No facility-administered encounter medications on  file as of 11/19/2018.      Patient Active Problem List   Diagnosis Date Noted  . Lower extremity edema 09/13/2018  . Near syncope 08/28/2018  . Hyponatremia 08/28/2018  . TIA (transient ischemic attack) 08/27/2018  . Discitis of thoracic region 08/23/2018  . CKD (chronic kidney disease) stage 2, GFR 60-89 ml/min 08/23/2018  . Chronic anemia 08/23/2018  . Discitis 08/23/2018  . Puncture wound of right lower leg without foreign body 10/08/2013  . Mitral insufficiency 12/28/2010  . Atrial fibrillation ([place]) 11/16/2010  . Hypertension   . GERD 04/22/2010  . PERSONAL HX COLONIC POLYPS 04/22/2010     Health Maintenance Due  Topic Date Due  . PNA vac Low Risk Adult (1 of 2 - PCV13) 01/08/1989  . INFLUENZA VACCINE  10/06/2018     Review of Systems Review of Systems  Constitutional: Negative for fever, chills, diaphoresis, activity change, appetite change, fatigue and unexpected weight change.  HENT: Negative for congestion, sore throat, rhinorrhea, sneezing, trouble swallowing and sinus pressure.  Eyes: Negative for photophobia and visual disturbance.  Respiratory: Negative for cough, chest tightness, shortness of breath, wheezing and stridor.  Cardiovascular: Negative for chest pain, palpitations and leg swelling.  Gastrointestinal: Negative for nausea, vomiting, abdominal pain, diarrhea, constipation, blood in stool, abdominal distention and anal bleeding.  Genitourinary: Negative for dysuria, hematuria, flank pain and difficulty urinating.  Musculoskeletal: Negative for myalgias, back pain, joint swelling, arthralgias and  gait problem.  Skin: Negative for color change, pallor, rash and wound.  Neurological: + dizziness, tremors, weakness and light-headedness.  Hematological: Negative for adenopathy. Does not bruise/bleed easily.  Psychiatric/Behavioral: Negative for behavioral problems, confusion, sleep disturbance, dysphoric mood, decreased concentration and agitation.     Physical Exam  BP (!) 145/67   Pulse 62   Temp 98.1 F (36.7 C) (Oral)   SpO2 97%  Physical Exam  Constitutional: He is oriented to person, place, and time. He appears well-developed and well-nourished. No distress.  HENT:  Mouth/Throat: Oropharynx is clear and moist. No oropharyngeal exudate.  Cardiovascular: Normal rate, regular rhythm and normal heart sounds. Exam reveals no gallop and no friction rub.  No murmur heard.  Pulmonary/Chest: Effort normal and breath sounds normal. No respiratory distress. He has no wheezes.  Abdominal: Soft. Bowel sounds are normal. He exhibits no distension. There is no tenderness.  Lymphadenopathy:  He has no cervical adenopathy.  Neurological: He is alert and oriented to person, place, and time.  Skin: Skin is warm and dry. No rash noted. No erythema.  Psychiatric: He has a normal mood and affect. His behavior is normal.    CBC Lab Results  Component Value Date   WBC 10.7 09/24/2018   RBC 2.91 (L) 09/24/2018   HGB 8.8 (L) 09/24/2018   HCT 26.6 (L) 09/24/2018   PLT 315 09/24/2018   MCV 91.4 09/24/2018   MCH 30.2 09/24/2018   MCHC 33.1 09/24/2018   RDW 12.2 09/24/2018   LYMPHSABS 589 (L) 09/24/2018   MONOABS 1.1 (H) 08/30/2018   EOSABS 642 (H) 09/24/2018    BMET Lab Results  Component Value Date   NA 128 (L) 09/24/2018   K 4.1 09/24/2018   CL 94 (L) 09/24/2018   CO2 28 09/24/2018   GLUCOSE 125 (H) 09/24/2018   BUN 23 09/24/2018   CREATININE 1.13 (H) 09/24/2018   CALCIUM 8.7 09/24/2018   GFRNONAA 45 (L) 08/30/2018   GFRAA 52 (L) 08/30/2018    Lab Results  Component Value Date   ESRSEDRATE 6 11/19/2018   Lab Results  Component Value Date   CRP 0.7 11/19/2018     Assessment and Plan  Thoracic discitis = will check inflammatory markers. Will temporarily hold doxycycline to see if it is causing disequilibirum  Low back pain = try tylenol at night  Health maintenance = flu vaccine - already received  Will televisit in  7-10d  Disequilibrium = suspect that this may be due to his BP meds. Will see if he notices any difference sans abtx.  ------------ Addendum = inflammatory markers are WNL

## 2018-11-20 LAB — CBC WITH DIFFERENTIAL/PLATELET
Absolute Monocytes: 594 cells/uL (ref 200–950)
Basophils Absolute: 50 cells/uL (ref 0–200)
Basophils Relative: 0.9 %
Eosinophils Absolute: 112 cells/uL (ref 15–500)
Eosinophils Relative: 2 %
HCT: 30.3 % — ABNORMAL LOW (ref 38.5–50.0)
Hemoglobin: 10.1 g/dL — ABNORMAL LOW (ref 13.2–17.1)
Lymphs Abs: 907 cells/uL (ref 850–3900)
MCH: 31.3 pg (ref 27.0–33.0)
MCHC: 33.3 g/dL (ref 32.0–36.0)
MCV: 93.8 fL (ref 80.0–100.0)
MPV: 10.2 fL (ref 7.5–12.5)
Monocytes Relative: 10.6 %
Neutro Abs: 3937 cells/uL (ref 1500–7800)
Neutrophils Relative %: 70.3 %
Platelets: 196 10*3/uL (ref 140–400)
RBC: 3.23 10*6/uL — ABNORMAL LOW (ref 4.20–5.80)
RDW: 14.2 % (ref 11.0–15.0)
Total Lymphocyte: 16.2 %
WBC: 5.6 10*3/uL (ref 3.8–10.8)

## 2018-11-20 LAB — SEDIMENTATION RATE: Sed Rate: 6 mm/h (ref 0–20)

## 2018-11-20 LAB — BASIC METABOLIC PANEL
BUN/Creatinine Ratio: 22 (calc) (ref 6–22)
BUN: 25 mg/dL (ref 7–25)
CO2: 28 mmol/L (ref 20–32)
Calcium: 9.1 mg/dL (ref 8.6–10.3)
Chloride: 98 mmol/L (ref 98–110)
Creat: 1.16 mg/dL — ABNORMAL HIGH (ref 0.70–1.11)
Glucose, Bld: 129 mg/dL — ABNORMAL HIGH (ref 65–99)
Potassium: 4.3 mmol/L (ref 3.5–5.3)
Sodium: 131 mmol/L — ABNORMAL LOW (ref 135–146)

## 2018-11-20 LAB — C-REACTIVE PROTEIN: CRP: 0.7 mg/L (ref <8.0)

## 2018-11-28 ENCOUNTER — Ambulatory Visit (INDEPENDENT_AMBULATORY_CARE_PROVIDER_SITE_OTHER): Payer: Medicare Other | Admitting: Internal Medicine

## 2018-11-28 ENCOUNTER — Other Ambulatory Visit: Payer: Self-pay

## 2018-11-28 DIAGNOSIS — E878 Other disorders of electrolyte and fluid balance, not elsewhere classified: Secondary | ICD-10-CM | POA: Diagnosis not present

## 2018-11-28 DIAGNOSIS — M4644 Discitis, unspecified, thoracic region: Secondary | ICD-10-CM

## 2018-11-28 NOTE — Progress Notes (Signed)
Rfv: televisit by phone, patient agreed to be contacted by phone at his home.  Patient ID: Cody Mendoza, male   DOB: 1921/09/19, 85 y.o.   MRN: [id number]  HPI 85 yo with thoracic discitis - temporarily stopped abtx for 2 wks to see if this correlated to his symptoms of dizziness that he mentioned at our last appt. He reports that he has not noticed any changes with dizziness. He has not had loss of consciousness or falls.   Outpatient Encounter Medications as of 11/28/2018  Medication Sig  . apixaban (ELIQUIS) 5 MG TABS tablet Take 1 tablet (5 mg total) by mouth 2 (two) times daily.  . carvedilol (COREG) 3.125 MG tablet Take 1 tablet (3.125 mg total) by mouth 2 (two) times daily with a meal.  . cefTRIAXone (ROCEPHIN) 2 g injection   . Cyanocobalamin (VITAMIN B12) 500 MCG TABS Take 500 mcg by mouth every morning.   Marland Kitchen doxazosin (CARDURA) 4 MG tablet Take 0.5 tablets (2 mg total) by mouth daily.  Marland Kitchen doxycycline (VIBRA-TABS) 100 MG tablet Take 1 tablet (100 mg total) by mouth 2 (two) times daily.  . Lactobacillus (PROBIOTIC ACIDOPHILUS PO) Take 1 capsule by mouth daily with supper.   . latanoprost (XALATAN) 0.005 % ophthalmic solution Place 1 drop into both eyes at bedtime.   Marland Kitchen losartan (COZAAR) 100 MG tablet Take 50 mg by mouth 2 (two) times daily.   . Multiple Vitamin (MULTIVITAMIN WITH MINERALS) TABS Take 1 tablet by mouth daily with supper.   . Multiple Vitamins-Minerals (ICAPS AREDS FORMULA PO) Take 1 capsule by mouth 2 (two) times daily with breakfast and lunch.   . pantoprazole (PROTONIX) 40 MG tablet Take 40 mg by mouth every morning.   [name] Glycol-Propyl Glycol (SYSTANE) 0.4-0.3 % SOLN Place 1 drop into both eyes at bedtime.    No facility-administered encounter medications on file as of 11/28/2018.      Patient Active Problem List   Diagnosis Date Noted  . Lower extremity edema 09/13/2018  . Near syncope 08/28/2018  . Hyponatremia 08/28/2018  . TIA (transient  ischemic attack) 08/27/2018  . Discitis of thoracic region 08/23/2018  . CKD (chronic kidney disease) stage 2, GFR 60-89 ml/min 08/23/2018  . Chronic anemia 08/23/2018  . Discitis 08/23/2018  . Puncture wound of right lower leg without foreign body 10/08/2013  . Mitral insufficiency 12/28/2010  . Atrial fibrillation ([facility]) 11/16/2010  . Hypertension   . GERD 04/22/2010  . PERSONAL HX COLONIC POLYPS 04/22/2010     Health Maintenance Due  Topic Date Due  . PNA vac Low Risk Adult (1 of 2 - PCV13) 01/08/1989    Social History   Tobacco Use  . Smoking status: Never Smoker  . Smokeless tobacco: Never Used  Substance Use Topics  . Alcohol use: Yes    Alcohol/week: 0.0 standard drinks    Comment: rarely  . Drug use: Never    Review of Systems Patient reports occasional dizziness with sitting to standing but also occurs in different situations. No back pain. 12 point ros is otherwise negative Physical Exam   There were no vitals taken for this visit.  No exam-due to televisit CBC Lab Results  Component Value Date   WBC 5.6 11/19/2018   RBC 3.23 (L) 11/19/2018   HGB 10.1 (L) 11/19/2018   HCT 30.3 (L) 11/19/2018   PLT 196 11/19/2018   MCV 93.8 11/19/2018   MCH 31.3 11/19/2018   MCHC 33.3 11/19/2018   RDW  14.2 11/19/2018   LYMPHSABS 907 11/19/2018   MONOABS 1.1 (H) 08/30/2018   EOSABS 112 11/19/2018    BMET Lab Results  Component Value Date   NA 131 (L) 11/19/2018   K 4.3 11/19/2018   CL 98 11/19/2018   CO2 28 11/19/2018   GLUCOSE 129 (H) 11/19/2018   BUN 25 11/19/2018   CREATININE 1.16 (H) 11/19/2018   CALCIUM 9.1 11/19/2018   GFRNONAA 45 (L) 08/30/2018   GFRAA 52 (L) 08/30/2018      Assessment and Plan Thoracic discitis = Restart abtx, doxycycline 100mg  bid  Dizziness = recommend to wait a few minutes from changing positions from sitting to standing before walking to see if postural related. also Recommended to see Dr. Joylene Draft regarding  vertigo/dizziness. doesnot appear to be related to abtx, nor hypotension  rtc in 4-6wk  Spent 15 min with patient discussing complaint of dizziness

## 2018-12-10 ENCOUNTER — Encounter: Payer: Self-pay | Admitting: Podiatry

## 2018-12-10 ENCOUNTER — Ambulatory Visit (INDEPENDENT_AMBULATORY_CARE_PROVIDER_SITE_OTHER): Payer: Medicare Other | Admitting: Podiatry

## 2018-12-10 ENCOUNTER — Other Ambulatory Visit: Payer: Self-pay

## 2018-12-10 VITALS — BP 184/80

## 2018-12-10 DIAGNOSIS — D689 Coagulation defect, unspecified: Secondary | ICD-10-CM | POA: Diagnosis not present

## 2018-12-10 DIAGNOSIS — B351 Tinea unguium: Secondary | ICD-10-CM

## 2018-12-10 DIAGNOSIS — M79676 Pain in unspecified toe(s): Secondary | ICD-10-CM

## 2018-12-10 DIAGNOSIS — L600 Ingrowing nail: Secondary | ICD-10-CM | POA: Diagnosis not present

## 2018-12-10 NOTE — Progress Notes (Signed)
Subjective:   Patient ID: Cody Mendoza, male   DOB: 85 y.o.   MRN: [id number]   HPI Patient presents with severely elongated nails 1-5 both feet that he cannot take care of and they get painful and that he is taking Eliquis and is scared to have anyone cut them for him.  Does not smoke likes to be active   Review of Systems  All other systems reviewed and are negative.       Objective:  Physical Exam Vitals signs and nursing note reviewed.  Constitutional:      Appearance: He is well-developed.  Pulmonary:     Effort: Pulmonary effort is normal.  Musculoskeletal: Normal range of motion.  Skin:    General: Skin is warm.  Neurological:     Mental Status: He is alert.     Neurovascular status found to be mildly diminished but intact with patient noted to have severely thickened nailbeds 1-5 both feet that are dystrophic and painful with palpation with incurvated nailbeds especially around the big toes.  Patient has good digital perfusion well oriented     Assessment:  Chronic mycotic nail infection with thickness 1-5 both feet with at risk condition and pain     Plan:  H&P conditions reviewed and sterile prep done and debridement accomplished with no iatrogenic bleeding and patient will be seen back for routine visit and is encouraged to call with questions concerns that he may have

## 2018-12-23 ENCOUNTER — Other Ambulatory Visit: Payer: Self-pay | Admitting: Internal Medicine

## 2019-01-24 ENCOUNTER — Other Ambulatory Visit: Payer: Self-pay

## 2019-01-24 ENCOUNTER — Other Ambulatory Visit (HOSPITAL_COMMUNITY): Payer: Self-pay | Admitting: Internal Medicine

## 2019-01-24 ENCOUNTER — Other Ambulatory Visit: Payer: Self-pay | Admitting: Internal Medicine

## 2019-01-24 ENCOUNTER — Ambulatory Visit (HOSPITAL_COMMUNITY)
Admission: RE | Admit: 2019-01-24 | Discharge: 2019-01-24 | Disposition: A | Discharge status: Home / Self Care | Payer: Medicare Other | Source: Ambulatory Visit | Attending: Internal Medicine | Admitting: Internal Medicine

## 2019-01-24 DIAGNOSIS — M8668 Other chronic osteomyelitis, other site: Secondary | ICD-10-CM

## 2019-01-24 DIAGNOSIS — R29898 Other symptoms and signs involving the musculoskeletal system: Secondary | ICD-10-CM

## 2019-01-24 DIAGNOSIS — M545 Low back pain, unspecified: Secondary | ICD-10-CM

## 2019-01-24 DIAGNOSIS — C61 Malignant neoplasm of prostate: Secondary | ICD-10-CM

## 2019-01-24 DIAGNOSIS — M546 Pain in thoracic spine: Secondary | ICD-10-CM

## 2019-01-24 DIAGNOSIS — M799 Soft tissue disorder, unspecified: Secondary | ICD-10-CM | POA: Insufficient documentation

## 2019-01-24 MED ORDER — GADOBUTROL 1 MMOL/ML IV SOLN
7.0000 mL | Freq: Once | INTRAVENOUS | Status: AC | PRN
  Administered 2019-01-24: 7 mL via INTRAVENOUS

## 2019-02-04 ENCOUNTER — Other Ambulatory Visit: Payer: Self-pay

## 2019-02-04 ENCOUNTER — Ambulatory Visit (INDEPENDENT_AMBULATORY_CARE_PROVIDER_SITE_OTHER): Payer: Medicare Other | Admitting: Adult Health

## 2019-02-04 ENCOUNTER — Encounter: Payer: Self-pay | Admitting: Adult Health

## 2019-02-04 VITALS — BP 146/67 | HR 71 | Temp 97.9°F | Ht 67.0 in | Wt 145.0 lb

## 2019-02-04 DIAGNOSIS — I10 Essential (primary) hypertension: Secondary | ICD-10-CM | POA: Diagnosis not present

## 2019-02-04 DIAGNOSIS — R29898 Other symptoms and signs involving the musculoskeletal system: Secondary | ICD-10-CM

## 2019-02-04 DIAGNOSIS — I4811 Longstanding persistent atrial fibrillation: Secondary | ICD-10-CM

## 2019-02-04 DIAGNOSIS — M4644 Discitis, unspecified, thoracic region: Secondary | ICD-10-CM | POA: Diagnosis not present

## 2019-02-04 DIAGNOSIS — G459 Transient cerebral ischemic attack, unspecified: Secondary | ICD-10-CM

## 2019-02-04 NOTE — Progress Notes (Signed)
[facility] [address] [place]. [place] [address] [phone]       OFFICE FOLLOW UP NOTE  [name] Date of Birth:  1921-09-17 Medical Record Number:  [id number]   Reason for Referral: TIA follow up    CHIEF COMPLAINT:  Chief Complaint  Patient presents with   Follow-up    Treatment room, with son. Weakness is his legs worsening. Wants to go over MRI.    HPI: Stroke admission 08/27/2018: [name] is a 85 y.o. male with history of A. fib on Pradaxa who developed a syncopal event, then  presented to [facility] ED on 08/27/2018 with left-sided weakness and confusion.   CT head negative for acute abnormality with evidence of small vessel disease and atrophy.  CTA head and neck negative for LVO with scattered plaque in ICAs, RVA 50% stenosis and diminutive VB system with bilateral fetal PCAs.  MRI negative for acute stroke.  2D echo normal EF with LA moderately dilated, RA moderately dilated but no cardiac source of embolus identified.  LDL 70 and A1c 5.0.  Previously on Pradaxa for atrial fibrillation and recommended continuation at discharge.  HTN initially elevated as high as 202/145 and stabilized throughout admission with recommendation of long-term BP goal normotensive range.  No indication for statin given advanced age and at goal LDL.  Other stroke risk factors include advanced age and EtOH use but no prior history of stroke.  Discharged home in stable condition.  Initial visit 10/03/2018: He has been doing well from a neurological standpoint since discharge without recurring stroke/TIA symptoms.  He was evaluated in the ED the following day of discharge due to syncopal event with blood pressure 80/50 and improvement of blood pressure prior to discharge.  Medication adjustments made with recent elevation of blood pressure levels.  Today's reading 161/71 with monitoring at home and typical SBP 150s.  He denies reoccurring syncopal events since that  time.  He does endorse ongoing fatigue with recent iron infusions with improvement.  Continues to be followed with PCP for hyponatremia.  Continues on Pradaxa without bleeding or bruising.  He continues to live with his wife performing all ADLs and some assistance by his daughter with IADLs.  He is questioning potential return to driving at this time.  He does endorse underlying balance difficulties which has been ongoing.  He is currently receiving home PT/OT with some improvement.  He does continue to use a cane or rolling walker for ambulation.  No further concerns.  Denies new or worsening stroke/TIA symptoms.  Update 02/04/2019: [name] is a 85 year old male who is being seen today for stroke follow-up accompanied by his son.  He has been doing well from a stroke standpoint without recurring or new stroke/TIA symptoms.  Recently transitioned from Pradaxa to Eliquis by cardiologist due to insurance reasons.  Has continued on Eliquis without bleeding or bruising.  Blood pressure today 146/67.  Greatest concern at today's visit was regarding worsening bilateral lower extremity weakness which has been slowly declining.  He has continued to follow with infectious disease due to thoracic discitis but due to worsening, PCP recently had repeat MRI thoracic spine and MRI lumbar spine on 01/24/2019.  Apparently, son and patient were told to review results and further discussion during today's visit.  He does not currently follow with neurosurgery as he was nonsurgical candidate with initial thoracic imaging during stroke admission therefore no follow-up recommended.  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  Lumbar spine imaging also showed L2-3 moderate multifactorial spinal stenosis which could be symptomatic and L3-4 severe multifactorial spinal stenosis likely to cause  neural compression.  He is currently nonambulatory due to legs consistently buckling or giving out and transfers via wheelchair.  No further concerns at this time.    ROS:   14 system review of systems performed and negative with exception of bilateral leg weakness  PMH:  Past Medical History:  Diagnosis Date   AF (atrial fibrillation) (HCC)    Asthma    Detached retina    Discitis of thoracic region 08/2018   Diverticulosis    GERD (gastroesophageal reflux disease)    Rosanna Randy syndrome    Hypertension    Lactose intolerance    Mitral insufficiency    Renal calculi    TIA (transient ischemic attack)     PSH:  Past Surgical History:  Procedure Laterality Date   cataract surgery     ESOPHAGEAL DILATION     In the past   [place]     Status post syrgical removal of a    TONSILLECTOMY      Social History:  Social History   Socioeconomic History   Marital status: Married    Spouse name: Not on file   Number of children: 2   Years of education: Not on file   Highest education level: Not on file  Occupational History   Occupation: Careers adviser: RETIRED  Social Needs   Financial resource strain: Not on file   Food insecurity    Worry: Not on file    Inability: Not on file   Transportation needs    Medical: Not on file    Non-medical: Not on file  Tobacco Use   Smoking status: Never Smoker   Smokeless tobacco: Never Used  Substance and Sexual Activity   Alcohol use: Yes    Alcohol/week: 0.0 standard drinks    Comment: rarely   Drug use: Never   Sexual activity: Not on file  Lifestyle   Physical activity    Days per week: Not on file    Minutes per session: Not on file   Stress: Not on file  Relationships   Social connections    Talks on phone: Not on file    Gets together: Not on file    Attends religious service: Not on file     Active member of club or organization: Not on file    Attends meetings of clubs or organizations: Not on file    Relationship status: Not on file   Intimate partner violence    Fear of current or ex partner: Not on file    Emotionally abused: Not on file    Physically abused: Not on file    Forced sexual activity: Not on file  Other Topics Concern   Not on file  Social History Narrative   Not on file    Family History:  Family History  Problem Relation Age of Onset   Hypertension Mother     Medications:   Current Outpatient Medications on File Prior to Visit  Medication Sig Dispense Refill   apixaban (ELIQUIS) 5 MG TABS tablet Take 1 tablet (5 mg total) by mouth 2 (two) times daily. 180 tablet 3   carvedilol (  COREG) 3.125 MG tablet Take 1 tablet (3.125 mg total) by mouth 2 (two) times daily with a meal. 60 tablet 0   Cyanocobalamin (VITAMIN B12) 500 MCG TABS Take 500 mcg by mouth every morning.      doxazosin (CARDURA) 4 MG tablet Take 0.5 tablets (2 mg total) by mouth daily. 30 tablet 0   doxycycline (VIBRA-TABS) 100 MG tablet TAKE 1 TABLET(100 MG) BY MOUTH TWICE DAILY 60 tablet 2   Lactobacillus (PROBIOTIC ACIDOPHILUS PO) Take 1 capsule by mouth daily with supper.      latanoprost (XALATAN) 0.005 % ophthalmic solution Place 1 drop into both eyes at bedtime.   11   losartan (COZAAR) 100 MG tablet Take 50 mg by mouth 2 (two) times daily.      Multiple Vitamin (MULTIVITAMIN WITH MINERALS) TABS Take 1 tablet by mouth daily with supper.      Multiple Vitamins-Minerals (ICAPS AREDS FORMULA PO) Take 1 capsule by mouth 2 (two) times daily with breakfast and lunch.      pantoprazole (PROTONIX) 40 MG tablet Take 40 mg by mouth every morning.      Polyethyl Glycol-Propyl Glycol (SYSTANE) 0.4-0.3 % SOLN Place 1 drop into both eyes at bedtime.      cefTRIAXone (ROCEPHIN) 2 g injection      No current facility-administered medications on file prior to visit.      Allergies:  No Known Allergies   Physical Exam  Vitals:   02/04/19 1339  BP: (!) 146/67  Pulse: 71  Temp: 97.9 F (36.6 C)  Weight: 145 lb (65.8 kg)  Height: 5\' 7"  (1.702 m)   Body mass index is 22.71 kg/m. No exam data present   General: well developed, well nourished, pleasant elderly Caucasian male, seated, in no evident distress Head: head normocephalic and atraumatic.   Neck: supple with no carotid or supraclavicular bruits Cardiovascular: irregular rate and rhythm, no murmurs Musculoskeletal: no deformity Skin:  no rash/petichiae Vascular:  Normal pulses all extremities   Neurologic Exam Mental Status: Awake and fully alert. Oriented to place and time. Recent and remote memory intact. Attention span, concentration and fund of knowledge appropriate. Mood and affect appropriate.  Cranial Nerves: Pupils equal, briskly reactive to light. Extraocular movements full without nystagmus. Visual fields full to confrontation. Hearing intact. Facial sensation intact. Face, tongue, palate moves normally and symmetrically.  Motor: Normal bulk and tone. Normal strength in bilateral upper extremities.  Noted weakness in bilateral lower extremities greater in hip flexors Sensory.: intact to touch , pinprick , position and vibratory sensation.  Coordination: Rapid alternating movements normal in all extremities. Finger-to-nose performed accurately bilaterally and heel-to-shin difficulty performing due to weakness Gait and Station: Deferred due to nonambulatory at this time Reflexes: 1+ and symmetric. Toes downgoing.      Diagnostic Data (Labs, Imaging, Testing)   Code Stroke CT head No acute abnormality. Small vessel disease. Atrophy. ASPECTS 10.     CTA head & neck no LVO.  Scattered plaque in ICAs.  RVA 50%.  Diminutive VB system with B fetal PCAs  MRI no acute stroke.  Atrophy.  Small vessel disease.  2D Echo EF 60 to 65%.  LA moderately dilated.  RA mildly dilated.  No  source of embolus.  LDL 70  HgbA1c 5.0   ASSESSMENT: Cody Mendoza is a 85 y.o. year old male here with right brain TIA on 08/27/2018 secondary to small vessel disease. Vascular risk factors include HTN, atrial fibrillation on Pradaxa, advanced age and  EtOH use.  Has been doing well from a stroke standpoint.  Greatest concern today is worsening bilateral lower extremity weakness with recent MRI lumbar and thoracic spine as described above (see HPI)    PLAN:  1. TIA: Continue Eliquis (apixaban) daily for secondary stroke prevention. Maintain strict control of hypertension with blood pressure goal below 130/90, diabetes with hemoglobin A1c goal below 6.5% and cholesterol with LDL cholesterol (bad cholesterol) goal below 70 mg/dL.  I also advised the patient to eat a healthy diet with plenty of whole grains, cereals, fruits and vegetables, exercise regularly with at least 30 minutes of continuous activity daily and maintain ideal body weight. 2. HTN: Advised to continue current treatment regimen.  Today's BP 146/67.  Advised to continue to monitor at home along with continued follow-up with PCP for management with avoidance of hypotension 3. Atrial fibrillation: Ongoing use of Eliquis along with follow-up with cardiology for ongoing management and monitoring 4. Bilateral lower extremity weakness: Briefly reviewed recent MRI with patient and son but advised to follow-up with infectious disease for ongoing thoracic discitis and potential lumbar discitis.  Also recommend evaluation by neurosurgery for further review and possible treatment options.  Son wishes to follow up with PCP for possible neurosurgery recommendations/referral or will contact office if referral needed   Follow up in 4 months or call earlier if needed   Greater than 50% of time during this 25 minute visit was spent on counseling, reviewing recent MRI and further discussion regarding worsening lower extremity weakness,  reviewing risk factor management of HTN and atrial fibrillation, planning of further management along with potential future management, and discussion with patient and family answering all questions.    Frann Rider, AGNP-BC  [facility] [address] [place],  [address]  Phone [phone] Fax [phone] Note: This document was prepared with digital dictation and possible smart phrase technology. Any transcriptional errors that result from this process are unintentional.

## 2019-02-04 NOTE — Patient Instructions (Signed)
Continue to follow with infectious disease regarding disc infection with a new thoracic spine and possible infection in your lumbar spine based on recent imaging  Please follow-up with Dr. Joylene Draft regarding neurosurgery referral but I would be more than happy as well to place this referral.  Please let me know if this is needed  Continue Eliquis (apixaban) daily  for secondary stroke prevention  Continue to follow up with PCP regarding cholesterol and blood pressure management    Continue to monitor blood pressure at home  Maintain strict control of hypertension with blood pressure goal below 130/90, diabetes with hemoglobin A1c goal below 6.5% and cholesterol with LDL cholesterol (bad cholesterol) goal below 70 mg/dL. I also advised the patient to eat a healthy diet with plenty of whole grains, cereals, fruits and vegetables, exercise regularly and maintain ideal body weight.  Followup in the future with me in 4 months or call earlier if needed       Thank you for coming to see [place] at [facility]. I hope we have been able to provide you high quality care today.  You may receive a patient satisfaction survey over the next few weeks. We would appreciate your feedback and comments so that we may continue to improve ourselves and the health of our patients.

## 2019-02-06 ENCOUNTER — Encounter: Payer: Self-pay | Admitting: Adult Health

## 2019-02-07 NOTE — Progress Notes (Signed)
I agree with the above plan 

## 2019-02-11 DIAGNOSIS — M47816 Spondylosis without myelopathy or radiculopathy, lumbar region: Secondary | ICD-10-CM | POA: Insufficient documentation

## 2019-02-11 DIAGNOSIS — M549 Dorsalgia, unspecified: Secondary | ICD-10-CM | POA: Insufficient documentation

## 2019-02-21 ENCOUNTER — Emergency Department (HOSPITAL_COMMUNITY)
Admission: EM | Admit: 2019-02-21 | Discharge: 2019-02-21 | Disposition: A | Discharge status: Home / Self Care | Payer: Medicare Other | Attending: Emergency Medicine | Admitting: Emergency Medicine

## 2019-02-21 ENCOUNTER — Emergency Department (HOSPITAL_COMMUNITY): Payer: Medicare Other

## 2019-02-21 DIAGNOSIS — R42 Dizziness and giddiness: Secondary | ICD-10-CM | POA: Insufficient documentation

## 2019-02-21 DIAGNOSIS — Z79899 Other long term (current) drug therapy: Secondary | ICD-10-CM | POA: Insufficient documentation

## 2019-02-21 DIAGNOSIS — N183 Chronic kidney disease, stage 3 unspecified: Secondary | ICD-10-CM | POA: Insufficient documentation

## 2019-02-21 DIAGNOSIS — I159 Secondary hypertension, unspecified: Secondary | ICD-10-CM | POA: Diagnosis not present

## 2019-02-21 DIAGNOSIS — Z8673 Personal history of transient ischemic attack (TIA), and cerebral infarction without residual deficits: Secondary | ICD-10-CM | POA: Diagnosis not present

## 2019-02-21 DIAGNOSIS — I129 Hypertensive chronic kidney disease with stage 1 through stage 4 chronic kidney disease, or unspecified chronic kidney disease: Secondary | ICD-10-CM | POA: Diagnosis present

## 2019-02-21 DIAGNOSIS — Z7901 Long term (current) use of anticoagulants: Secondary | ICD-10-CM | POA: Insufficient documentation

## 2019-02-21 LAB — CBC WITH DIFFERENTIAL/PLATELET
Abs Immature Granulocytes: 0.03 10*3/uL (ref 0.00–0.07)
Basophils Absolute: 0 10*3/uL (ref 0.0–0.1)
Basophils Relative: 0 %
Eosinophils Absolute: 0 10*3/uL (ref 0.0–0.5)
Eosinophils Relative: 0 %
HCT: 31.5 % — ABNORMAL LOW (ref 39.0–52.0)
Hemoglobin: 10.2 g/dL — ABNORMAL LOW (ref 13.0–17.0)
Immature Granulocytes: 0 %
Lymphocytes Relative: 6 %
Lymphs Abs: 0.5 10*3/uL — ABNORMAL LOW (ref 0.7–4.0)
MCH: 31.7 pg (ref 26.0–34.0)
MCHC: 32.4 g/dL (ref 30.0–36.0)
MCV: 97.8 fL (ref 80.0–100.0)
Monocytes Absolute: 0.6 10*3/uL (ref 0.1–1.0)
Monocytes Relative: 7 %
Neutro Abs: 7.6 10*3/uL (ref 1.7–7.7)
Neutrophils Relative %: 87 %
Platelets: 286 10*3/uL (ref 150–400)
RBC: 3.22 MIL/uL — ABNORMAL LOW (ref 4.22–5.81)
RDW: 12.2 % (ref 11.5–15.5)
WBC: 8.8 10*3/uL (ref 4.0–10.5)
nRBC: 0 % (ref 0.0–0.2)

## 2019-02-21 LAB — BASIC METABOLIC PANEL
Anion gap: 9 (ref 5–15)
BUN: 28 mg/dL — ABNORMAL HIGH (ref 8–23)
CO2: 25 mmol/L (ref 22–32)
Calcium: 9 mg/dL (ref 8.9–10.3)
Chloride: 102 mmol/L (ref 98–111)
Creatinine, Ser: 1.03 mg/dL (ref 0.61–1.24)
GFR calc Af Amer: >60 mL/min (ref >60)
GFR calc non Af Amer: >60 mL/min (ref >60)
Glucose, Bld: 118 mg/dL — ABNORMAL HIGH (ref 70–99)
Potassium: 3.7 mmol/L (ref 3.5–5.1)
Sodium: 136 mmol/L (ref 135–145)

## 2019-02-21 LAB — TROPONIN I (HIGH SENSITIVITY): Troponin I (High Sensitivity): 15 ng/L (ref <18)

## 2019-02-21 MED ORDER — LOSARTAN POTASSIUM 50 MG PO TABS
50.0000 mg | ORAL_TABLET | Freq: Once | ORAL | Status: AC
  Administered 2019-02-21: 16:00:00 50 mg via ORAL
  Filled 2019-02-21: qty 1

## 2019-02-21 MED ORDER — AMLODIPINE BESYLATE 5 MG PO TABS: 5.0000 mg | ORAL_TABLET | Freq: Every day | ORAL | 0 refills | Status: AC

## 2019-02-21 MED ORDER — AMLODIPINE BESYLATE 5 MG PO TABS
5.0000 mg | ORAL_TABLET | Freq: Once | ORAL | Status: AC
  Administered 2019-02-21: 16:00:00 5 mg via ORAL
  Filled 2019-02-21: qty 1

## 2019-02-21 MED ORDER — LABETALOL HCL 5 MG/ML IV SOLN
10.0000 mg | Freq: Once | INTRAVENOUS | Status: AC
  Administered 2019-02-21: 14:00:00 10 mg via INTRAVENOUS
  Filled 2019-02-21: qty 4

## 2019-02-21 MED ORDER — CARVEDILOL 3.125 MG PO TABS
3.1250 mg | ORAL_TABLET | Freq: Once | ORAL | Status: AC
  Administered 2019-02-21: 16:00:00 3.125 mg via ORAL
  Filled 2019-02-21: qty 1

## 2019-02-21 NOTE — ED Provider Notes (Signed)
[facility] Provider Note   CSN: [id number] Arrival date & time: 02/21/19  1154     History Chief Complaint  Patient presents with  . Hypertension    Cody Mendoza is a 85 y.o. male.  Patient here for evaluation of hypertension.  Lives at a skilled nursing facility.  Blood pressure elevated today and patient did have some dizziness when he changed directions from lying to sitting.  Patient is wheelchair-bound.  Otherwise he does not have any neurological symptoms.  Asymptomatic upon my evaluation.  No chest pain, no shortness of breath.  States that he has taken his medications today.  Does have some chronic kidney disease as well.  The history is provided by the patient.  Hypertension This is a chronic problem. The current episode started more than 1 week ago. The problem occurs daily. The problem has not changed since onset.Pertinent negatives include no chest pain, no abdominal pain, no headaches and no shortness of breath. Nothing aggravates the symptoms. Nothing relieves the symptoms. He has tried nothing for the symptoms. The treatment provided no relief.       Past Medical History:  Diagnosis Date  . AF (atrial fibrillation) ([place])   . Asthma   . Detached retina   . Discitis of thoracic region 08/2018  . Diverticulosis   . GERD (gastroesophageal reflux disease)   . [name] syndrome   . Hypertension   . Lactose intolerance   . Mitral insufficiency   . Renal calculi   . TIA (transient ischemic attack)     Patient Active Problem List   Diagnosis Date Noted  . Lower extremity edema 09/13/2018  . Near syncope 08/28/2018  . Hyponatremia 08/28/2018  . TIA (transient ischemic attack) 08/27/2018  . Discitis of thoracic region 08/23/2018  . CKD (chronic kidney disease) stage 2, GFR 60-89 ml/min 08/23/2018  . Chronic anemia 08/23/2018  . Discitis 08/23/2018  . Puncture wound of right lower leg without foreign body 10/08/2013    . Mitral insufficiency 12/28/2010  . Atrial fibrillation ([place]) 11/16/2010  . Hypertension   . GERD 04/22/2010  . PERSONAL HX COLONIC POLYPS 04/22/2010    Past Surgical History:  Procedure Laterality Date  . cataract surgery    . ESOPHAGEAL DILATION     In the past  . FINGER TENDON REPAIR    . INGUINAL HERNIA REPAIR    . KIDNEY STONE SURGERY     Status post syrgical removal of a   . TONSILLECTOMY         Family History  Problem Relation Age of Onset  . Hypertension Mother     Social History   Tobacco Use  . Smoking status: Never Smoker  . Smokeless tobacco: Never Used  Substance Use Topics  . Alcohol use: Yes    Alcohol/week: 0.0 standard drinks    Comment: rarely  . Drug use: Never    Home Medications Prior to Admission medications   Medication Sig Start Date End Date Taking? Authorizing Provider  amLODipine (NORVASC) 5 MG tablet Take 1 tablet (5 mg total) by mouth daily. 02/21/19 03/23/19  Shandie Bertz, DO  apixaban (ELIQUIS) 5 MG TABS tablet Take 1 tablet (5 mg total) by mouth 2 (two) times daily. 09/17/18   Martinique, Peter M, MD  carvedilol (COREG) 3.125 MG tablet Take 1 tablet (3.125 mg total) by mouth 2 (two) times daily with a meal. 08/27/18   Hongalgi, Lenis Dickinson, MD  cefTRIAXone (ROCEPHIN) 2 g injection  09/13/18   [provider]  Cyanocobalamin (VITAMIN B12) 500 MCG TABS Take 500 mcg by mouth every morning.     [provider]  doxazosin (CARDURA) 4 MG tablet Take 0.5 tablets (2 mg total) by mouth daily. 08/28/18   Hongalgi, Lenis Dickinson, MD  doxycycline (VIBRA-TABS) 100 MG tablet TAKE 1 TABLET(100 MG) BY MOUTH TWICE DAILY 12/24/18   Carlyle Basques, MD  Lactobacillus (PROBIOTIC ACIDOPHILUS PO) Take 1 capsule by mouth daily with supper.     [provider]  latanoprost (XALATAN) 0.005 % ophthalmic solution Place 1 drop into both eyes at bedtime.  09/05/17   [provider]  losartan (COZAAR) 100 MG tablet Take 50 mg by mouth 2 (two)  times daily.     [provider]  Multiple Vitamin (MULTIVITAMIN WITH MINERALS) TABS Take 1 tablet by mouth daily with supper.     [provider]  Multiple Vitamins-Minerals (ICAPS AREDS FORMULA PO) Take 1 capsule by mouth 2 (two) times daily with breakfast and lunch.     [provider]  pantoprazole (PROTONIX) 40 MG tablet Take 40 mg by mouth every morning.     [provider]  Polyethyl Glycol-Propyl Glycol (SYSTANE) 0.4-0.3 % SOLN Place 1 drop into both eyes at bedtime.     [provider]    Allergies    Patient has no known allergies.  Review of Systems   Review of Systems  Constitutional: Negative for chills and fever.  HENT: Negative for ear pain and sore throat.   Eyes: Negative for pain and visual disturbance.  Respiratory: Negative for cough and shortness of breath.   Cardiovascular: Negative for chest pain and palpitations.  Gastrointestinal: Negative for abdominal pain and vomiting.  Genitourinary: Negative for dysuria and hematuria.  Musculoskeletal: Negative for arthralgias and back pain.  Skin: Negative for color change and rash.  Neurological: Positive for dizziness (resolved). Negative for seizures, syncope and headaches.  All other systems reviewed and are negative.   Physical Exam Updated Vital Signs  ED Triage Vitals  Enc Vitals Group     BP 02/21/19 1158 (!) 224/107     Pulse Rate 02/21/19 1158 78     Resp 02/21/19 1158 (!) 22     Temp 02/21/19 1158 98.5 F (36.9 C)     Temp Source 02/21/19 1158 Oral     SpO2 02/21/19 1158 97 %     Weight 02/21/19 1200 145 lb 1 oz (65.8 kg)     Height 02/21/19 1200 5\' 7"  (1.702 m)     Head Circumference --      Peak Flow --      Pain Score 02/21/19 1159 0     Pain Loc --      Pain Edu? --      Excl. in [place]? --     Physical Exam Vitals and nursing note reviewed.  Constitutional:      General: He is not in acute distress.    Appearance: He is well-developed. He is not  ill-appearing.  HENT:     Head: Normocephalic and atraumatic.  Eyes:     Extraocular Movements: Extraocular movements intact.     Conjunctiva/sclera: Conjunctivae normal.     Pupils: Pupils are equal, round, and reactive to light.  Cardiovascular:     Rate and Rhythm: Normal rate and regular rhythm.     Heart sounds: No murmur.  Pulmonary:     Effort: Pulmonary effort is normal. No respiratory distress.  Breath sounds: Normal breath sounds.  Abdominal:     Palpations: Abdomen is soft.     Tenderness: There is no abdominal tenderness.  Musculoskeletal:     Cervical back: Neck supple.  Skin:    General: Skin is warm and dry.  Neurological:     General: No focal deficit present.     Mental Status: He is alert and oriented to person, place, and time.     Cranial Nerves: No cranial nerve deficit.     Sensory: No sensory deficit.     Comments: Grossly normal strength and sensation throughout, normal speech, no visual field deficit     ED Results / Procedures / Treatments   Labs (all labs ordered are listed, but only abnormal results are displayed) Labs Reviewed  CBC WITH DIFFERENTIAL/PLATELET - Abnormal; Notable for the following components:      Result Value   RBC 3.22 (*)    Hemoglobin 10.2 (*)    HCT 31.5 (*)    Lymphs Abs 0.5 (*)    All other components within normal limits  BASIC METABOLIC PANEL - Abnormal; Notable for the following components:   Glucose, Bld 118 (*)    BUN 28 (*)    All other components within normal limits  TROPONIN I (HIGH SENSITIVITY)    EKG EKG Interpretation  Date/Time:  Thursday February 21 2019 12:07:14 EST Ventricular Rate:  84 PR Interval:    QRS Duration: 85 QT Interval:  401 QTC Calculation: 474 R Axis:   -39 Text Interpretation: Atrial fibrillation Ventricular premature complex Inferior infarct, old Anterior infarct, old Confirmed by Lennice Sites [phone]) on 02/21/2019 1:18:05 PM   Radiology CT Head Wo Contrast  Result  Date: 02/21/2019 CLINICAL DATA:  Vertigo, peripheral. EXAM: CT HEAD WITHOUT CONTRAST TECHNIQUE: Contiguous axial images were obtained from the base of the skull through the vertex without intravenous contrast. COMPARISON:  Brain MRI 08/27/2018, noncontrast head CT 08/27/2018 FINDINGS: Brain: No evidence of acute intracranial hemorrhage. No demarcated cortical infarction. No evidence of intracranial mass. No midline shift or extra-axial fluid collection. Advanced scattered and confluent hypodensity within the cerebral white matter is nonspecific, but consistent with chronic small vessel ischemic disease. Moderate generalized parenchymal atrophy. Vascular: No hyperdense vessel. Atherosclerotic calcifications. Skull: No calvarial fracture or suspicious osseous lesion. Sinuses/Orbits: Bilateral lens replacements. Mild paranasal sinus mucosal thickening, greatest within the bilateral maxillary sinuses. No significant mastoid effusion. IMPRESSION: No evidence of acute intracranial abnormality. Generalized parenchymal atrophy with advanced chronic small vessel ischemic disease. Mild bilateral maxillary sinus mucosal thickening. Electronically Signed   By: Kellie Simmering DO   On: 02/21/2019 15:12   DG Chest Portable 1 View  Result Date: 02/21/2019 CLINICAL DATA:  Hypertension, shortness of breath EXAM: PORTABLE CHEST 1 VIEW COMPARISON:  July 2020 FINDINGS: Chronic interstitial prominence with superimposed patchy primarily bibasilar density. No pleural effusion or pneumothorax. Mild cardiomegaly. IMPRESSION: Patchy density superimposed on chronic interstitial prominence which may reflect mild edema and/or atelectasis. Electronically Signed   By: Macy Mis M.D.   On: 02/21/2019 13:24    Procedures Procedures (including critical care time)  Medications Ordered in ED Medications  amLODipine (NORVASC) tablet 5 mg (has no administration in time range)  carvedilol (COREG) tablet 3.125 mg (has no administration in  time range)  losartan (COZAAR) tablet 50 mg (has no administration in time range)  labetalol (NORMODYNE) injection 10 mg (10 mg Intravenous Given 02/21/19 1343)    ED Course  I have reviewed the triage vital signs  and the nursing notes.  Pertinent labs & imaging results that were available during my care of the patient were reviewed by me and considered in my medical decision making (see chart for details).    MDM Rules/Calculators/A&P                       BASSEL RUDISILL is a 85 year old male with history of atrial fibrillation, hypertension who presents to the ED with hypertension.  Blood pressure 220/107 upon arrival but otherwise normal vitals.  Patient did have some dizziness this morning when he went from lying to sitting but that quickly resolved.  He does use a wheelchair at all times.  Denies hitting his head.  Denies any other neurological symptoms.  He has fairly normal neurological exam at baseline.  EKG shows rate controlled atrial fibrillation.  No ischemic changes.  Patient denies any chest pain or shortness of breath.  Will get screening labs to evaluate for any signs of endorgan damage.  Will get head CT and chest x-ray also for further evaluation.  Will make sure no head bleed.  Does not have any stroke symptoms at this time.  Denies any infectious symptoms.  Will give IV labetalol and anticipate adding antihypertensives to his chronic regimen likely amlodipine.  Blood pressure improved to 180/90.  Patient overall asymptomatic throughout my care.  Troponin was normal.  Kidney function normal.  No signs of endorgan damage.  Head CT unremarkable.  Overall work-up is reassuring.  Will start amlodipine and have him follow-up with his primary care doctor for further blood pressure management.  Patient was given evening home blood pressure medications as well prior to discharge.  This chart was dictated using voice recognition software.  Despite best efforts to proofread,  errors  can occur which can change the documentation meaning.     Final Clinical Impression(s) / ED Diagnoses Final diagnoses:  Secondary hypertension    Rx / DC Orders ED Discharge Orders         Ordered    amLODipine (NORVASC) 5 MG tablet  Daily     02/21/19 1532           Lennice Sites, DO 02/21/19 1534

## 2019-02-21 NOTE — Discharge Instructions (Signed)
Blood work today and head CT today were normal.  Overall you have chronic hypertension that appears to be slightly worse today.  You have been started on a new blood pressure medication amlodipine.  Please take this once a day.  Your evening blood pressure medications have been given including your Coreg and losartan.  Please do not take these tonight.  Follow-up with your primary care doctor for further blood pressure management.

## 2019-02-21 NOTE — ED Triage Notes (Signed)
BIB EMS from home with hypertension. Called PCP and was advised to come to ER. EMS reports bp 202/103. Pt c/o dizziness, feeling flushed, nausea and shortness of breath on exertion.

## 2019-02-21 NOTE — ED Notes (Signed)
Patient Alert and oriented to baseline. Stable and ambulatory to baseline. Patient verbalized understanding of the discharge instructions. Spoke with patients son [name] about discharge instructions.  Patient belongings were taken by the patient.

## 2019-03-15 ENCOUNTER — Ambulatory Visit: Payer: Medicare Other | Admitting: Podiatry

## 2019-03-18 ENCOUNTER — Ambulatory Visit: Payer: Medicare Other | Admitting: Podiatry

## 2019-03-26 ENCOUNTER — Ambulatory Visit: Payer: Medicare Other | Admitting: Cardiology

## 2019-04-17 NOTE — Progress Notes (Signed)
Virtual Visit via Telephone Note   This visit type was conducted due to national recommendations for restrictions regarding the COVID-19 Pandemic (e.g. social distancing) in an effort to limit this patient's exposure and mitigate transmission in our community.  Due to his co-morbid illnesses, this patient is at least at moderate risk for complications without adequate follow up.  This format is felt to be most appropriate for this patient at this time.  The patient did not have access to video technology/had technical difficulties with video requiring transitioning to audio format only (telephone).  All issues noted in this document were discussed and addressed.  No physical exam could be performed with this format.  Please refer to the patient's chart for his  consent to telehealth for [facility].   Date:  04/18/2019   ID:  Cody Mendoza, DOB 16-Sep-1921, MRN [id number]  Patient Location: Home Provider Location: Home  PCP:  Crist Infante, MD  Cardiologist:  Keymani Mclean [place] MD Electrophysiologist:  Crissie Sickles MD  Evaluation Performed:  Follow-Up Visit  Chief Complaint:  Afib   History of Present Illness:    Cody Mendoza is a 86 y.o. male with chronic atrial fibrillation. He is managed with rate control and  anticoagulation with Pradaxa.   He was admitted 6/18-6/22/20 with a TIA. He presented to [facility] on 08/23/18 with transient speech difficulty and inability to operate the telephone. His wife called EMS and CODE stroke was activated but imaging negative for acute stroke. CXR showed incidental possible thoracic aortic aneurysm, followed CTA of chest and abdomen, which showed no aortic aneurysm or dissection, but did show T8-T9 osteomyelitis and discitis. This was confirmed by MRI of thoracic spine. Neurosurgery was consulted but pt declined surgery. He was treated with antibiotics.During that admission he was seen in Consultation by Crissie Sickles for evaluation of NSVT 39 beats  versus aberrancy. This was asymptomatic. Echo showed normal EF. Observation recommended. Noted to have some slow as well as elevated HR with Afib. On low dose Coreg. Not felt to be a candidate for pacemaker due to the fact he was asymptomatic and had an active infection. Monitoring of symptoms recommended.   On follow up today he reports his feet have gone out and he can't move. Now in a wheelchair. Thinks this related to spinal stenosis. Still on chronic antibiotics. He is at [facility]. No chest pain, dyspnea, syncope. He did have some vertigo better on meclizine. Not felt to be a surgical candidate.   He has no symptoms concerning for COVID-19 infection (fever, chills, cough, or new shortness of breath).    Past Medical History:  Diagnosis Date  . AF (atrial fibrillation) ([place])   . Asthma   . Detached retina   . Discitis of thoracic region 08/2018  . Diverticulosis   . GERD (gastroesophageal reflux disease)   . [name] syndrome   . Hypertension   . Lactose intolerance   . Mitral insufficiency   . Renal calculi   . TIA (transient ischemic attack)    Past Surgical History:  Procedure Laterality Date  . cataract surgery    . ESOPHAGEAL DILATION     In the past  . FINGER TENDON REPAIR    . INGUINAL HERNIA REPAIR    . KIDNEY STONE SURGERY     Status post syrgical removal of a   . TONSILLECTOMY       Current Meds  Medication Sig  . amLODipine (NORVASC) 5 MG tablet Take 5 mg by mouth  daily.  . apixaban (ELIQUIS) 5 MG TABS tablet Take 1 tablet (5 mg total) by mouth 2 (two) times daily.  . carvedilol (COREG) 3.125 MG tablet Take 1 tablet (3.125 mg total) by mouth 2 (two) times daily with a meal.  . Cyanocobalamin (VITAMIN B12) 500 MCG TABS Take 500 mcg by mouth every morning.   Marland Kitchen doxazosin (CARDURA) 4 MG tablet Take 0.5 tablets (2 mg total) by mouth daily.  Marland Kitchen doxycycline (VIBRA-TABS) 100 MG tablet TAKE 1 TABLET(100 MG) BY MOUTH TWICE DAILY  . Lactobacillus (PROBIOTIC ACIDOPHILUS  PO) Take 1 capsule by mouth daily with supper.   . latanoprost (XALATAN) 0.005 % ophthalmic solution Place 1 drop into both eyes at bedtime.   Marland Kitchen losartan (COZAAR) 100 MG tablet Take 50 mg by mouth 2 (two) times daily.   . Multiple Vitamin (MULTIVITAMIN WITH MINERALS) TABS Take 1 tablet by mouth daily with supper.   . Multiple Vitamins-Minerals (ICAPS AREDS FORMULA PO) Take 1 capsule by mouth 2 (two) times daily with breakfast and lunch.   . pantoprazole (PROTONIX) 40 MG tablet Take 40 mg by mouth every morning.   [name] Glycol-Propyl Glycol (SYSTANE) 0.4-0.3 % SOLN Place 1 drop into both eyes at bedtime.      Allergies:   Patient has no known allergies.   Social History   Tobacco Use  . Smoking status: Never Smoker  . Smokeless tobacco: Never Used  Substance Use Topics  . Alcohol use: Yes    Alcohol/week: 0.0 standard drinks    Comment: rarely  . Drug use: Never     Family Hx: The patient's family history includes Hypertension in his mother.  ROS:   Please see the history of present illness.    All other systems reviewed and are negative.   Prior CV studies:   The following studies were reviewed today:  Echo 08/26/18: IMPRESSIONS   1. The left ventricle has normal systolic function with an ejection fraction of 60-65%. The cavity size was normal. There is mildly increased left ventricular wall thickness. Left ventricular diastolic Doppler parameters are consistent with impaired  relaxation. 2. The right ventricle has normal systolic function. The cavity was normal. There is no increase in right ventricular wall thickness. 3. Left atrial size was moderately dilated. 4. Right atrial size was mildly dilated. 5. The aortic valve is tricuspid. Aortic valve regurgitation is trivial by color flow Doppler. No stenosis of the aortic valve.   Labs/Other Tests and Data Reviewed:    EKG:  No ECG reviewed.  Recent Labs: 08/30/2018: ALT 23 02/21/2019: BUN 28;  Creatinine, Ser 1.03; Hemoglobin 10.2; Platelets 286; Potassium 3.7; Sodium 136   Recent Lipid Panel Lab Results  Component Value Date/Time   CHOL 137 08/28/2018 10:35 AM   TRIG 34 08/28/2018 10:35 AM   HDL 60 08/28/2018 10:35 AM   CHOLHDL 2.3 08/28/2018 10:35 AM   LDLCALC 70 08/28/2018 10:35 AM   Dated 12/17/18: cholesterol 129, triglycerides 28, HDL 53, LDL 70. TSH normal.  Wt Readings from Last 3 Encounters:  02/21/19 145 lb 1 oz (65.8 kg)  02/04/19 145 lb (65.8 kg)  10/22/18 144 lb (65.3 kg)     Objective:    Vital Signs:  BP (!) 141/63   Pulse 70   Ht 5\' 7"  (1.702 m)   BMI 22.72 kg/m    VITAL SIGNS:  reviewed  ASSESSMENT & PLAN:    1. Atrial fibrillation. Chronic, persistent. Previously rate  controlled on no AV nodal blockers.  Now on low dose Coreg. HR is well controlled and he is asymptomatic.  Continue Eliquis  2. HTN. well controlled now.  3. NSVT versus aberrancy. Asymptomatic on low dose Coreg.  4. T8-9 discitis/osteomyelitis. Chronic spinal stenosis.    COVID-19 Education: The signs and symptoms of COVID-19 were discussed with the patient and how to seek care for testing (follow up with PCP or arrange E-visit).  The importance of social distancing was discussed today.  Time:   Today, I have spent 10 minutes with the patient with telehealth technology discussing the above problems.     Medication Adjustments/Labs and Tests Ordered: Current medicines are reviewed at length with the patient today.  Concerns regarding medicines are outlined above.   Tests Ordered: No orders of the defined types were placed in this encounter.   Medication Changes: No orders of the defined types were placed in this encounter.   Follow Up:  Either In Person or Virtual in 6 month(s)  Signed, Braxson Hollingsworth [place], MD  04/18/2019 9:34 AM    [place]

## 2019-04-18 ENCOUNTER — Encounter: Payer: Self-pay | Admitting: Cardiology

## 2019-04-18 ENCOUNTER — Telehealth (INDEPENDENT_AMBULATORY_CARE_PROVIDER_SITE_OTHER): Payer: Medicare Other | Admitting: Cardiology

## 2019-04-18 VITALS — BP 141/63 | HR 70 | Ht 67.0 in

## 2019-04-18 DIAGNOSIS — I471 Supraventricular tachycardia: Secondary | ICD-10-CM | POA: Diagnosis not present

## 2019-04-18 DIAGNOSIS — I10 Essential (primary) hypertension: Secondary | ICD-10-CM

## 2019-04-18 DIAGNOSIS — I482 Chronic atrial fibrillation, unspecified: Secondary | ICD-10-CM

## 2019-04-18 DIAGNOSIS — I495 Sick sinus syndrome: Secondary | ICD-10-CM

## 2019-04-18 DIAGNOSIS — M4624 Osteomyelitis of vertebra, thoracic region: Secondary | ICD-10-CM

## 2019-04-18 DIAGNOSIS — M4644 Discitis, unspecified, thoracic region: Secondary | ICD-10-CM | POA: Diagnosis not present

## 2019-04-18 NOTE — Patient Instructions (Signed)
Medication Instructions:  Continue same medications *If you need a refill on your cardiac medications before your next appointment, please call your pharmacy*  Lab Work: None ordered   Testing/Procedures: None ordered  Follow-Up: At [facility], you and your health needs are our priority.  As part of our continuing mission to provide you with exceptional heart care, we have created designated Provider Care Teams.  These Care Teams include your primary Cardiologist (physician) and Advanced Practice Providers (APPs -  Physician Assistants and Nurse Practitioners) who all work together to provide you with the care you need, when you need it.  Your next appointment:  6 months  Call in June to schedule August appointment   The format for your next appointment: Office   Provider:  Dr.Jordan   

## 2019-04-29 ENCOUNTER — Other Ambulatory Visit: Payer: Self-pay | Admitting: Neurosurgery

## 2019-04-29 DIAGNOSIS — M4644 Discitis, unspecified, thoracic region: Secondary | ICD-10-CM

## 2019-05-02 DIAGNOSIS — D6859 Other primary thrombophilia: Secondary | ICD-10-CM | POA: Insufficient documentation

## 2019-05-07 DIAGNOSIS — R310 Gross hematuria: Secondary | ICD-10-CM | POA: Insufficient documentation

## 2019-05-28 ENCOUNTER — Other Ambulatory Visit: Payer: Medicare Other

## 2019-06-03 ENCOUNTER — Other Ambulatory Visit: Payer: Self-pay

## 2019-06-03 ENCOUNTER — Ambulatory Visit
Admission: RE | Admit: 2019-06-03 | Discharge: 2019-06-03 | Disposition: A | Discharge status: Home / Self Care | Payer: Medicare Other | Source: Ambulatory Visit | Attending: Neurosurgery | Admitting: Neurosurgery

## 2019-06-03 DIAGNOSIS — M4644 Discitis, unspecified, thoracic region: Secondary | ICD-10-CM

## 2019-06-03 MED ORDER — GADOBENATE DIMEGLUMINE 529 MG/ML IV SOLN
13.0000 mL | Freq: Once | INTRAVENOUS | Status: AC | PRN
  Administered 2019-06-03: 10:00:00 13 mL via INTRAVENOUS

## 2019-06-05 ENCOUNTER — Ambulatory Visit: Payer: Medicare Other | Admitting: Adult Health

## 2019-06-05 ENCOUNTER — Encounter: Payer: Self-pay | Admitting: Podiatry

## 2019-06-05 ENCOUNTER — Ambulatory Visit (INDEPENDENT_AMBULATORY_CARE_PROVIDER_SITE_OTHER): Payer: Medicare Other | Admitting: Podiatry

## 2019-06-05 ENCOUNTER — Other Ambulatory Visit: Payer: Self-pay

## 2019-06-05 VITALS — Temp 97.0°F

## 2019-06-05 DIAGNOSIS — D689 Coagulation defect, unspecified: Secondary | ICD-10-CM

## 2019-06-05 DIAGNOSIS — B351 Tinea unguium: Secondary | ICD-10-CM | POA: Diagnosis not present

## 2019-06-05 NOTE — Progress Notes (Signed)
Subjective:   Patient ID: Cody Mendoza, male   DOB: 86 y.o.   MRN: [id number]   HPI Patient presents with significant nail disease 1-5 both feet with nails being incurvated thickened and moderately painful when pressed   ROS      Objective:  Physical Exam  Mycotic nail infection with yellow brittle debris 1-5 both feet that are painful when pressed and impossible for patient to cut is in wheelchair and presents with caregiver     Assessment:  Chronic mycotic painful nail disease 1-5 both feet with pain     Plan:  Reviewed condition debrided nailbeds 1-5 both feet no iatrogenic bleeding and return for routine care

## 2019-06-10 DIAGNOSIS — H903 Sensorineural hearing loss, bilateral: Secondary | ICD-10-CM | POA: Insufficient documentation

## 2019-06-10 DIAGNOSIS — H6123 Impacted cerumen, bilateral: Secondary | ICD-10-CM | POA: Insufficient documentation

## 2019-06-26 ENCOUNTER — Emergency Department (HOSPITAL_COMMUNITY): Payer: Medicare Other

## 2019-06-26 ENCOUNTER — Encounter (HOSPITAL_COMMUNITY): Payer: Self-pay

## 2019-06-26 ENCOUNTER — Inpatient Hospital Stay (HOSPITAL_COMMUNITY)
Admission: EM | Admit: 2019-06-26 | Discharge: 2019-07-07 | DRG: 291 | Disposition: A | Discharge status: Skilled Nursing Facility | Payer: Medicare Other | Attending: Family Medicine | Admitting: Family Medicine

## 2019-06-26 DIAGNOSIS — D631 Anemia in chronic kidney disease: Secondary | ICD-10-CM | POA: Diagnosis present

## 2019-06-26 DIAGNOSIS — T502X5A Adverse effect of carbonic-anhydrase inhibitors, benzothiadiazides and other diuretics, initial encounter: Secondary | ICD-10-CM | POA: Diagnosis not present

## 2019-06-26 DIAGNOSIS — K219 Gastro-esophageal reflux disease without esophagitis: Secondary | ICD-10-CM | POA: Diagnosis present

## 2019-06-26 DIAGNOSIS — Z86010 Personal history of colonic polyps: Secondary | ICD-10-CM

## 2019-06-26 DIAGNOSIS — Y92239 Unspecified place in hospital as the place of occurrence of the external cause: Secondary | ICD-10-CM | POA: Diagnosis not present

## 2019-06-26 DIAGNOSIS — N179 Acute kidney failure, unspecified: Secondary | ICD-10-CM | POA: Diagnosis not present

## 2019-06-26 DIAGNOSIS — E0580 Other thyrotoxicosis without thyrotoxic crisis or storm: Secondary | ICD-10-CM | POA: Diagnosis present

## 2019-06-26 DIAGNOSIS — R17 Unspecified jaundice: Secondary | ICD-10-CM | POA: Diagnosis present

## 2019-06-26 DIAGNOSIS — M4644 Discitis, unspecified, thoracic region: Secondary | ICD-10-CM | POA: Diagnosis present

## 2019-06-26 DIAGNOSIS — I130 Hypertensive heart and chronic kidney disease with heart failure and stage 1 through stage 4 chronic kidney disease, or unspecified chronic kidney disease: Secondary | ICD-10-CM | POA: Diagnosis present

## 2019-06-26 DIAGNOSIS — G459 Transient cerebral ischemic attack, unspecified: Secondary | ICD-10-CM | POA: Diagnosis not present

## 2019-06-26 DIAGNOSIS — R062 Wheezing: Secondary | ICD-10-CM | POA: Diagnosis not present

## 2019-06-26 DIAGNOSIS — M4800 Spinal stenosis, site unspecified: Secondary | ICD-10-CM | POA: Diagnosis present

## 2019-06-26 DIAGNOSIS — I10 Essential (primary) hypertension: Secondary | ICD-10-CM | POA: Diagnosis present

## 2019-06-26 DIAGNOSIS — I5033 Acute on chronic diastolic (congestive) heart failure: Secondary | ICD-10-CM | POA: Diagnosis present

## 2019-06-26 DIAGNOSIS — Z7401 Bed confinement status: Secondary | ICD-10-CM

## 2019-06-26 DIAGNOSIS — R0602 Shortness of breath: Secondary | ICD-10-CM

## 2019-06-26 DIAGNOSIS — I358 Other nonrheumatic aortic valve disorders: Secondary | ICD-10-CM | POA: Diagnosis present

## 2019-06-26 DIAGNOSIS — I4811 Longstanding persistent atrial fibrillation: Secondary | ICD-10-CM | POA: Diagnosis not present

## 2019-06-26 DIAGNOSIS — I509 Heart failure, unspecified: Secondary | ICD-10-CM | POA: Diagnosis not present

## 2019-06-26 DIAGNOSIS — E739 Lactose intolerance, unspecified: Secondary | ICD-10-CM | POA: Diagnosis present

## 2019-06-26 DIAGNOSIS — I5082 Biventricular heart failure: Secondary | ICD-10-CM | POA: Diagnosis present

## 2019-06-26 DIAGNOSIS — Z20822 Contact with and (suspected) exposure to covid-19: Secondary | ICD-10-CM | POA: Diagnosis present

## 2019-06-26 DIAGNOSIS — R197 Diarrhea, unspecified: Secondary | ICD-10-CM | POA: Diagnosis not present

## 2019-06-26 DIAGNOSIS — I4819 Other persistent atrial fibrillation: Secondary | ICD-10-CM | POA: Diagnosis present

## 2019-06-26 DIAGNOSIS — N189 Chronic kidney disease, unspecified: Secondary | ICD-10-CM | POA: Diagnosis not present

## 2019-06-26 DIAGNOSIS — R410 Disorientation, unspecified: Secondary | ICD-10-CM | POA: Diagnosis not present

## 2019-06-26 DIAGNOSIS — M25511 Pain in right shoulder: Secondary | ICD-10-CM | POA: Diagnosis present

## 2019-06-26 DIAGNOSIS — Z8673 Personal history of transient ischemic attack (TIA), and cerebral infarction without residual deficits: Secondary | ICD-10-CM

## 2019-06-26 DIAGNOSIS — E876 Hypokalemia: Secondary | ICD-10-CM | POA: Diagnosis present

## 2019-06-26 DIAGNOSIS — E871 Hypo-osmolality and hyponatremia: Secondary | ICD-10-CM | POA: Diagnosis not present

## 2019-06-26 DIAGNOSIS — Z993 Dependence on wheelchair: Secondary | ICD-10-CM | POA: Diagnosis not present

## 2019-06-26 DIAGNOSIS — I4891 Unspecified atrial fibrillation: Secondary | ICD-10-CM | POA: Diagnosis present

## 2019-06-26 DIAGNOSIS — Z66 Do not resuscitate: Secondary | ICD-10-CM | POA: Diagnosis present

## 2019-06-26 DIAGNOSIS — N1831 Chronic kidney disease, stage 3a: Secondary | ICD-10-CM | POA: Diagnosis present

## 2019-06-26 DIAGNOSIS — I700 Atherosclerosis of aorta: Secondary | ICD-10-CM | POA: Diagnosis present

## 2019-06-26 DIAGNOSIS — N182 Chronic kidney disease, stage 2 (mild): Secondary | ICD-10-CM | POA: Diagnosis not present

## 2019-06-26 DIAGNOSIS — Z79899 Other long term (current) drug therapy: Secondary | ICD-10-CM

## 2019-06-26 DIAGNOSIS — Z7901 Long term (current) use of anticoagulants: Secondary | ICD-10-CM

## 2019-06-26 DIAGNOSIS — G9340 Encephalopathy, unspecified: Secondary | ICD-10-CM | POA: Diagnosis not present

## 2019-06-26 DIAGNOSIS — Z8249 Family history of ischemic heart disease and other diseases of the circulatory system: Secondary | ICD-10-CM

## 2019-06-26 DIAGNOSIS — I5031 Acute diastolic (congestive) heart failure: Secondary | ICD-10-CM | POA: Diagnosis not present

## 2019-06-26 DIAGNOSIS — R609 Edema, unspecified: Secondary | ICD-10-CM | POA: Diagnosis not present

## 2019-06-26 DIAGNOSIS — Z87442 Personal history of urinary calculi: Secondary | ICD-10-CM

## 2019-06-26 DIAGNOSIS — I5020 Unspecified systolic (congestive) heart failure: Principal | ICD-10-CM

## 2019-06-26 DIAGNOSIS — R4781 Slurred speech: Secondary | ICD-10-CM | POA: Diagnosis not present

## 2019-06-26 DIAGNOSIS — I482 Chronic atrial fibrillation, unspecified: Secondary | ICD-10-CM | POA: Diagnosis not present

## 2019-06-26 DIAGNOSIS — R140 Abdominal distension (gaseous): Secondary | ICD-10-CM

## 2019-06-26 LAB — CBC WITH DIFFERENTIAL/PLATELET
Abs Immature Granulocytes: 0.07 10*3/uL (ref 0.00–0.07)
Basophils Absolute: 0.1 10*3/uL (ref 0.0–0.1)
Basophils Relative: 1 %
Eosinophils Absolute: 0.8 10*3/uL — ABNORMAL HIGH (ref 0.0–0.5)
Eosinophils Relative: 7 %
HCT: 28.5 % — ABNORMAL LOW (ref 39.0–52.0)
Hemoglobin: 9 g/dL — ABNORMAL LOW (ref 13.0–17.0)
Immature Granulocytes: 1 %
Lymphocytes Relative: 11 %
Lymphs Abs: 1.3 10*3/uL (ref 0.7–4.0)
MCH: 30.4 pg (ref 26.0–34.0)
MCHC: 31.6 g/dL (ref 30.0–36.0)
MCV: 96.3 fL (ref 80.0–100.0)
Monocytes Absolute: 1.4 10*3/uL — ABNORMAL HIGH (ref 0.1–1.0)
Monocytes Relative: 13 %
Neutro Abs: 7.5 10*3/uL (ref 1.7–7.7)
Neutrophils Relative %: 67 %
Platelets: 259 10*3/uL (ref 150–400)
RBC: 2.96 MIL/uL — ABNORMAL LOW (ref 4.22–5.81)
RDW: 14.4 % (ref 11.5–15.5)
WBC: 11.2 10*3/uL — ABNORMAL HIGH (ref 4.0–10.5)
nRBC: 0 % (ref 0.0–0.2)

## 2019-06-26 LAB — COMPREHENSIVE METABOLIC PANEL
ALT: 23 U/L (ref 0–44)
AST: 26 U/L (ref 15–41)
Albumin: 3.3 g/dL — ABNORMAL LOW (ref 3.5–5.0)
Alkaline Phosphatase: 127 U/L — ABNORMAL HIGH (ref 38–126)
Anion gap: 10 (ref 5–15)
BUN: 36 mg/dL — ABNORMAL HIGH (ref 8–23)
CO2: 22 mmol/L (ref 22–32)
Calcium: 8.8 mg/dL — ABNORMAL LOW (ref 8.9–10.3)
Chloride: 108 mmol/L (ref 98–111)
Creatinine, Ser: 1.19 mg/dL (ref 0.61–1.24)
GFR calc Af Amer: 60 mL/min — ABNORMAL LOW (ref >60)
GFR calc non Af Amer: 52 mL/min — ABNORMAL LOW (ref >60)
Glucose, Bld: 114 mg/dL — ABNORMAL HIGH (ref 70–99)
Potassium: 4 mmol/L (ref 3.5–5.1)
Sodium: 140 mmol/L (ref 135–145)
Total Bilirubin: 0.8 mg/dL (ref 0.3–1.2)
Total Protein: 6.8 g/dL (ref 6.5–8.1)

## 2019-06-26 MED ORDER — FUROSEMIDE 10 MG/ML IJ SOLN
40.0000 mg | Freq: Once | INTRAMUSCULAR | Status: AC
  Administered 2019-06-26: 40 mg via INTRAVENOUS
  Filled 2019-06-26: qty 4

## 2019-06-26 NOTE — ED Triage Notes (Signed)
Pt complains of scrotal pain for one day, he noticed it and told staff, pt doesn't complain of any pain

## 2019-06-27 ENCOUNTER — Inpatient Hospital Stay (HOSPITAL_COMMUNITY): Payer: Medicare Other

## 2019-06-27 ENCOUNTER — Other Ambulatory Visit: Payer: Self-pay

## 2019-06-27 ENCOUNTER — Encounter (HOSPITAL_COMMUNITY): Payer: Self-pay | Admitting: Internal Medicine

## 2019-06-27 DIAGNOSIS — I5031 Acute diastolic (congestive) heart failure: Secondary | ICD-10-CM

## 2019-06-27 DIAGNOSIS — R609 Edema, unspecified: Secondary | ICD-10-CM

## 2019-06-27 LAB — BASIC METABOLIC PANEL
Anion gap: 8 (ref 5–15)
BUN: 35 mg/dL — ABNORMAL HIGH (ref 8–23)
CO2: 25 mmol/L (ref 22–32)
Calcium: 8.7 mg/dL — ABNORMAL LOW (ref 8.9–10.3)
Chloride: 105 mmol/L (ref 98–111)
Creatinine, Ser: 1.14 mg/dL (ref 0.61–1.24)
GFR calc Af Amer: >60 mL/min (ref >60)
GFR calc non Af Amer: 54 mL/min — ABNORMAL LOW (ref >60)
Glucose, Bld: 87 mg/dL (ref 70–99)
Potassium: 4.8 mmol/L (ref 3.5–5.1)
Sodium: 138 mmol/L (ref 135–145)

## 2019-06-27 LAB — CBC
HCT: 28.4 % — ABNORMAL LOW (ref 39.0–52.0)
Hemoglobin: 9 g/dL — ABNORMAL LOW (ref 13.0–17.0)
MCH: 30.3 pg (ref 26.0–34.0)
MCHC: 31.7 g/dL (ref 30.0–36.0)
MCV: 95.6 fL (ref 80.0–100.0)
Platelets: 258 10*3/uL (ref 150–400)
RBC: 2.97 MIL/uL — ABNORMAL LOW (ref 4.22–5.81)
RDW: 14.4 % (ref 11.5–15.5)
WBC: 10.2 10*3/uL (ref 4.0–10.5)
nRBC: 0.4 % — ABNORMAL HIGH (ref 0.0–0.2)

## 2019-06-27 LAB — RESPIRATORY PANEL BY RT PCR (FLU A&B, COVID)
Influenza A by PCR: NEGATIVE
Influenza B by PCR: NEGATIVE
SARS Coronavirus 2 by RT PCR: NEGATIVE

## 2019-06-27 LAB — BRAIN NATRIURETIC PEPTIDE: B Natriuretic Peptide: 165.6 pg/mL — ABNORMAL HIGH (ref 0.0–100.0)

## 2019-06-27 LAB — ECHOCARDIOGRAM COMPLETE

## 2019-06-27 MED ORDER — PANTOPRAZOLE SODIUM 40 MG PO TBEC
40.0000 mg | DELAYED_RELEASE_TABLET | Freq: Every morning | ORAL | Status: DC
  Administered 2019-06-27 – 2019-06-30 (×4): 40 mg via ORAL
  Filled 2019-06-27 (×4): qty 1

## 2019-06-27 MED ORDER — DOXYCYCLINE HYCLATE 100 MG PO TABS
100.0000 mg | ORAL_TABLET | Freq: Two times a day (BID) | ORAL | Status: DC
  Administered 2019-06-27 – 2019-06-30 (×7): 100 mg via ORAL
  Filled 2019-06-27 (×7): qty 1

## 2019-06-27 MED ORDER — CARVEDILOL 3.125 MG PO TABS
3.1250 mg | ORAL_TABLET | Freq: Two times a day (BID) | ORAL | Status: DC
  Administered 2019-06-27 – 2019-06-29 (×5): 3.125 mg via ORAL
  Filled 2019-06-27 (×6): qty 1

## 2019-06-27 MED ORDER — IPRATROPIUM BROMIDE 0.02 % IN SOLN
0.5000 mg | Freq: Two times a day (BID) | RESPIRATORY_TRACT | Status: DC
  Administered 2019-06-27: 0.5 mg via RESPIRATORY_TRACT
  Filled 2019-06-27: qty 2.5

## 2019-06-27 MED ORDER — IPRATROPIUM BROMIDE 0.02 % IN SOLN
0.5000 mg | Freq: Four times a day (QID) | RESPIRATORY_TRACT | Status: DC
  Administered 2019-06-27: 0.5 mg via RESPIRATORY_TRACT
  Filled 2019-06-27: qty 2.5

## 2019-06-27 MED ORDER — LEVALBUTEROL HCL 0.63 MG/3ML IN NEBU
0.6300 mg | INHALATION_SOLUTION | Freq: Four times a day (QID) | RESPIRATORY_TRACT | Status: DC
  Administered 2019-06-27: 0.63 mg via RESPIRATORY_TRACT
  Filled 2019-06-27: qty 3

## 2019-06-27 MED ORDER — VITAMIN B-12 1000 MCG PO TABS
500.0000 ug | ORAL_TABLET | Freq: Every morning | ORAL | Status: DC
  Administered 2019-06-27 – 2019-07-07 (×10): 500 ug via ORAL
  Filled 2019-06-27 (×6): qty 1
  Filled 2019-06-27: qty 0.5
  Filled 2019-06-27 (×4): qty 1

## 2019-06-27 MED ORDER — LOSARTAN POTASSIUM 50 MG PO TABS
50.0000 mg | ORAL_TABLET | Freq: Two times a day (BID) | ORAL | Status: DC
  Administered 2019-06-27 (×2): 50 mg via ORAL
  Filled 2019-06-27 (×3): qty 1

## 2019-06-27 MED ORDER — LEVALBUTEROL HCL 0.63 MG/3ML IN NEBU
0.6300 mg | INHALATION_SOLUTION | Freq: Four times a day (QID) | RESPIRATORY_TRACT | Status: DC | PRN
  Administered 2019-06-27: 0.63 mg via RESPIRATORY_TRACT
  Filled 2019-06-27: qty 3

## 2019-06-27 MED ORDER — ONDANSETRON HCL 4 MG PO TABS: 4.0000 mg | ORAL_TABLET | Freq: Four times a day (QID) | ORAL | Status: DC | PRN

## 2019-06-27 MED ORDER — POTASSIUM CHLORIDE CRYS ER 20 MEQ PO TBCR
20.0000 meq | EXTENDED_RELEASE_TABLET | Freq: Every day | ORAL | Status: DC
  Administered 2019-06-27 – 2019-07-07 (×10): 20 meq via ORAL
  Filled 2019-06-27 (×11): qty 1

## 2019-06-27 MED ORDER — ONDANSETRON HCL 4 MG/2ML IJ SOLN: 4.0000 mg | Freq: Four times a day (QID) | INTRAMUSCULAR | Status: DC | PRN

## 2019-06-27 MED ORDER — DOXAZOSIN MESYLATE 2 MG PO TABS
2.0000 mg | ORAL_TABLET | Freq: Every day | ORAL | Status: DC
  Administered 2019-06-27 – 2019-07-07 (×10): 2 mg via ORAL
  Filled 2019-06-27 (×11): qty 1

## 2019-06-27 MED ORDER — LEVALBUTEROL HCL 0.63 MG/3ML IN NEBU
0.6300 mg | INHALATION_SOLUTION | Freq: Two times a day (BID) | RESPIRATORY_TRACT | Status: DC
  Administered 2019-06-27: 0.63 mg via RESPIRATORY_TRACT
  Filled 2019-06-27: qty 3

## 2019-06-27 MED ORDER — FUROSEMIDE 10 MG/ML IJ SOLN
40.0000 mg | Freq: Two times a day (BID) | INTRAMUSCULAR | Status: DC
  Administered 2019-06-27 – 2019-06-28 (×3): 40 mg via INTRAVENOUS
  Filled 2019-06-27 (×3): qty 4

## 2019-06-27 MED ORDER — LATANOPROST 0.005 % OP SOLN
1.0000 [drp] | Freq: Every day | OPHTHALMIC | Status: DC
  Administered 2019-06-27 – 2019-07-06 (×10): 1 [drp] via OPHTHALMIC
  Filled 2019-06-27: qty 2.5

## 2019-06-27 MED ORDER — APIXABAN 5 MG PO TABS
5.0000 mg | ORAL_TABLET | Freq: Two times a day (BID) | ORAL | Status: DC
  Administered 2019-06-27 – 2019-06-29 (×6): 5 mg via ORAL
  Filled 2019-06-27 (×7): qty 1

## 2019-06-27 MED ORDER — IPRATROPIUM BROMIDE 0.02 % IN SOLN: 0.5000 mg | Freq: Four times a day (QID) | RESPIRATORY_TRACT | Status: DC | PRN

## 2019-06-27 MED ORDER — AMLODIPINE BESYLATE 5 MG PO TABS
5.0000 mg | ORAL_TABLET | Freq: Every evening | ORAL | Status: DC
  Administered 2019-06-27 – 2019-07-07 (×11): 5 mg via ORAL
  Filled 2019-06-27 (×11): qty 1

## 2019-06-27 NOTE — H&P (Signed)
History and Physical    Cody Mendoza [id number] DOB: 06-10-1921 DOA: 06/26/2019  PCP: Cody Infante, MD  Patient coming from: Skilled nursing facility.  History obtained from patient and patient's sons and previous records.  ER physician.  Chief Complaint: Increasing lower extremity edema and shortness of breath.  HPI: Cody Mendoza is a 86 y.o. male with history of A. 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  Denies any chest pain productive cough fever or chills.  ED Course: In the ER on exam patient has significant swelling of both lower [place] extending up to the thighs chest x-ray showing congestion.  EKG shows A. fib.  Labs show BNP of 165 hemoglobin of 9 WBC 11.2 Covid test negative.  Patient also had complaint of scrotal edema but on exam patient has no signs of any infection of the scrotum.  Patient was started on Lasix for acute CHF admitted for further management.  Review of Systems: As per HPI, rest all negative.   Past Medical History:  Diagnosis Date  . AF (atrial fibrillation) ([place])   . Asthma   . Detached retina   . Discitis of thoracic region 08/2018  . Diverticulosis   . GERD (gastroesophageal reflux disease)   . [name] syndrome   . Hypertension   . Lactose intolerance   . Mitral insufficiency   . Renal calculi   . TIA (transient ischemic attack)     Past Surgical History:  Procedure Laterality Date  . cataract surgery    . ESOPHAGEAL DILATION     In the past  . FINGER TENDON REPAIR    . INGUINAL HERNIA REPAIR    . KIDNEY STONE SURGERY     Status post syrgical removal of a   . TONSILLECTOMY       reports that he has never smoked. He has never used smokeless tobacco. He reports current  alcohol use. He reports that he does not use drugs.  No Known Allergies  Family History  Problem Relation Age of Onset  . Hypertension Mother     Prior to Admission medications   Medication Sig Start Date End Date Taking? Authorizing Provider  amLODipine (NORVASC) 5 MG tablet Take 1 tablet (5 mg total) by mouth daily. Patient taking differently: Take 5 mg by mouth every evening.  02/21/19 06/26/19 Yes Curatolo, Adam, DO  apixaban (ELIQUIS) 5 MG TABS tablet Take 1 tablet (5 mg total) by mouth 2 (two) times daily. 09/17/18  Yes Martinique, Peter M, MD  carvedilol (COREG) 3.125 MG tablet Take 1 tablet (3.125 mg total) by mouth 2 (two) times daily with a meal. 08/27/18  Yes Hongalgi, Lenis Dickinson, MD  Cyanocobalamin (VITAMIN B12) 500 MCG TABS Take 500 mcg by mouth every morning.    Yes [provider]  doxazosin (CARDURA) 4 MG tablet Take 0.5 tablets (2 mg total) by mouth daily. 08/28/18  Yes Hongalgi, Lenis Dickinson, MD  doxycycline (VIBRA-TABS) 100 MG tablet TAKE 1 TABLET(100 MG) BY MOUTH TWICE DAILY Patient taking differently: Take 100 mg by mouth 2 (two) times daily.  12/24/18  Yes Carlyle Basques, MD  Lactobacillus (PROBIOTIC ACIDOPHILUS PO) Take 1 capsule by mouth daily with supper.    Yes [provider]  latanoprost (XALATAN) 0.005 % ophthalmic solution Place  1 drop into both eyes at bedtime.  09/05/17  Yes [provider]  losartan (COZAAR) 100 MG tablet Take 50 mg by mouth 2 (two) times daily.    Yes [provider]  Multiple Vitamin (MULTIVITAMIN WITH MINERALS) TABS Take 1 tablet by mouth daily with supper.    Yes [provider]  Multiple Vitamins-Minerals (ICAPS AREDS FORMULA PO) Take 1 capsule by mouth 2 (two) times daily with breakfast and lunch.    Yes [provider]  pantoprazole (PROTONIX) 40 MG tablet Take 40 mg by mouth every morning.    Yes [provider]  Polyethyl Glycol-Propyl Glycol (SYSTANE) 0.4-0.3 % SOLN Place 1 drop into  both eyes at bedtime.    Yes [provider]    Physical Exam: Constitutional: Moderately built and nourished. Vitals:   06/26/19 2200 06/26/19 2330 06/27/19 0000 06/27/19 0007  BP: (!) 166/73   (!) 157/104  Pulse: 81 76 89 94  Resp: 19 19 (!) 21 18  Temp:      TempSrc:      SpO2: 96% 97% 98% 96%   Eyes: Anicteric no pallor. ENMT: No discharge from the ears eyes nose or mouth. Neck: No mass felt.  No neck rigidity.  JVD elevated. Respiratory: Mild wheezing.  No crepitations. Cardiovascular: S1-S2 heard. Abdomen: Soft nontender bowel sounds present. Musculoskeletal: Bilateral lower extremity edema extending up to the thighs. Skin: No rash. Neurologic: Alert awake oriented time place and person.  Moves all extremities. Psychiatric: Appears normal per normal affect.   Labs on Admission: I have personally reviewed following labs and imaging studies  CBC: Recent Labs  Lab 06/26/19 2203  WBC 11.2*  NEUTROABS 7.5  HGB 9.0*  HCT 28.5*  MCV 96.3  PLT [id number]   Basic Metabolic Panel: Recent Labs  Lab 06/26/19 2203  NA 140  K 4.0  CL 108  CO2 22  GLUCOSE 114*  BUN 36*  CREATININE 1.19  CALCIUM 8.8*   GFR: CrCl cannot be calculated (Unknown ideal weight.). Liver Function Tests: Recent Labs  Lab 06/26/19 2203  AST 26  ALT 23  ALKPHOS 127*  BILITOT 0.8  PROT 6.8  ALBUMIN 3.3*   No results for input(s): LIPASE, AMYLASE in the last 168 hours. No results for input(s): AMMONIA in the last 168 hours. Coagulation Profile: No results for input(s): INR, PROTIME in the last 168 hours. Cardiac Enzymes: No results for input(s): CKTOTAL, CKMB, CKMBINDEX, TROPONINI in the last 168 hours. BNP (last 3 results) No results for input(s): PROBNP in the last 8760 hours. HbA1C: No results for input(s): HGBA1C in the last 72 hours. CBG: No results for input(s): GLUCAP in the last 168 hours. Lipid Profile: No results for input(s): CHOL, HDL, LDLCALC, TRIG, CHOLHDL,  LDLDIRECT in the last 72 hours. Thyroid Function Tests: No results for input(s): TSH, T4TOTAL, FREET4, T3FREE, THYROIDAB in the last 72 hours. Anemia Panel: No results for input(s): VITAMINB12, FOLATE, FERRITIN, TIBC, IRON, RETICCTPCT in the last 72 hours. Urine analysis:    Component Value Date/Time   COLORURINE YELLOW 08/30/2018 1845   APPEARANCEUR HAZY (A) 08/30/2018 1845   LABSPEC 1.019 08/30/2018 1845   PHURINE 5.0 08/30/2018 1845   GLUCOSEU NEGATIVE 08/30/2018 1845   HGBUR NEGATIVE 08/30/2018 1845   BILIRUBINUR NEGATIVE 08/30/2018 1845   KETONESUR NEGATIVE 08/30/2018 1845   PROTEINUR 30 (A) 08/30/2018 1845   UROBILINOGEN 1.0 09/21/2014 1610   NITRITE NEGATIVE 08/30/2018 1845   LEUKOCYTESUR NEGATIVE 08/30/2018 1845   Sepsis Labs: @LABRCNTIP (procalcitonin:4,lacticidven:4) )  No results found for this or any previous visit (from the past 240 hour(s)).   Radiological Exams on Admission: DG Chest Port 1 View  Result Date: 06/26/2019 CLINICAL DATA:  Shortness of breath, wheezing EXAM: PORTABLE CHEST 1 VIEW COMPARISON:  02/21/2019 FINDINGS: Cardiomegaly, vascular congestion. Probable layering bilateral effusions with bibasilar atelectasis or infiltrates. No overt edema. No acute bony abnormality. Aortic atherosclerosis. IMPRESSION: Cardiomegaly with vascular congestion. Layering bilateral effusions with bibasilar atelectasis or infiltrates. Electronically Signed   By: Rolm Baptise M.D.   On: 06/26/2019 21:46    EKG: Independently reviewed.  A. fib rate controlled.  Assessment/Plan Principal Problem:   Acute CHF (congestive heart failure) (HCC) Active Problems:   Hypertension   Atrial fibrillation (HCC)   Discitis of thoracic region   CKD (chronic kidney disease) stage 2, GFR 60-89 ml/min   TIA (transient ischemic attack)    1. Acute CHF likely diastolic last EF measured in 2020 was 60 to 65%.  Will place patient on Lasix 40 mg IV every 12 closely follow intake output  metabolic panel so far patient has output of 2 L after receiving the first dose of Lasix.  Check 2D echo.  Check cardiac markers.  Given that patient is bedbound will check Dopplers of the lower extremity to rule out DVT. 2. Chronic A. fib patient is on Coreg and apixaban. 3. Hypertension on Coreg ARB and amlodipine. 4. T8-T9 discitis for which patient on chronic doxycycline therapy being followed by Dr. Graylon Good infectious disease. 5. Spinal stenosis bedbound being followed by neurosurgeon Dr. Kathyrn Sheriff. 6. Chronic anemia follow CBC.  On B12 supplements.  Given that patient has acute CHF will need close monitoring for any further worsening in inpatient status.   DVT prophylaxis: Apixaban. Code Status: DNR confirmed with patient's son. Family Communication: Patient's son. Disposition Plan: Back to SNF when patient is stable. Consults called: None. Admission status: Inpatient.   [name] MD [facility] Pager [phone].  If 7PM-7AM, please contact night-coverage [email or web address] Password [facility]  06/27/2019, 1:34 AM

## 2019-06-27 NOTE — Progress Notes (Signed)
  Echocardiogram 2D Echocardiogram has been performed.  [name] A Gurbani Figge 06/27/2019, 1:53 PM

## 2019-06-27 NOTE — Progress Notes (Signed)
Lower extremity venous has been completed.   Preliminary results in CV Proc.   [name] 06/27/2019 9:11 AM

## 2019-06-27 NOTE — ED Notes (Signed)
Pt. Documented in error see above note in chart. 

## 2019-06-27 NOTE — TOC Progression Note (Signed)
Transition of Care [facility]) - Progression Note    Patient Details  Name: Cody Mendoza MRN: [id number] Date of Birth: 07-24-1921  Transition of [facility]) CM/SW Contact  Yumalay Circle, Juliann Pulse, RN Phone Number: 06/27/2019, 2:23 PM  Clinical Narrative:Per son [name]-patient receives pvt duty [facility]custodial level care @ [facility].PTAR needed @ d/c.       Expected Discharge Plan: Home/Self Care Barriers to Discharge: Continued Medical Work up  Expected Discharge Plan and Services Expected Discharge Plan: Home/Self Care   Discharge Planning Services: CM Consult   Living arrangements for the past 2 months: [place]                                       Social Determinants of Health (SDOH) Interventions    Readmission Risk Interventions No flowsheet data found.

## 2019-06-27 NOTE — ED Notes (Signed)
Report has been called by Catalina Antigua, RN. Transport is delayed, as [place] is at the bedside currently.

## 2019-06-27 NOTE — Progress Notes (Signed)
Care started prior to midnight in the emergency room patient was admitted early this morning by Dr. Gean Birchwood in current agreement with his assessment and plan.  Additional changes to the plan of care have been made accordingly.  The patient is a elderly generally bedbound 86 year old Caucasian male with a past medical history significant for but not limited to atrial fibrillation, hypertension, mitral valve insufficiency, GERD, history of [name] syndrome, history of TIA.  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  He is admitted for acute diastolic CHF and was placed on IV Lasix 40 mg every 12.  Lower extremity duplex was done and showed DVT in the lower extremities bilaterally.  On admission his BNP was elevated to 165.6.  ECHOCARDIOGRAM is to be repeated and still pending to be done.  Patient has been admitted for and currently being treated for the following but not limited to:   Acute CHF likely Diastolic  -last EF measured in 2020 was 60 to 65%.   -BNP on Admission was 165.6 -Will place patient on Lasix 40 mg IV every 12 closely follow intake output metabolic panel -so far patient has output of 2 L after receiving the first dose of Lasix.   -C/w Carvedilol 3.125 mg po BID, Losartan 50 mg po BID -Check 2D echo.   -Check cardiac markers.   -Given that patient is bedbound will check Dopplers of the lower extremity to rule out DVT. -Continue to monitor signs and symptoms of volume overload and repeat chest x-ray in the a.m.  Chronic A. Fib  -Patient is on Carvedilol 3.125 mg po BID and Apixaban 5 mg po BID .  Hypertension  -C/w Carvedilol 3.125 mg po BID, Losartan 50 mg po BID and Amlodipine 5 mg po Daily    T8-T9 Discitis  -for which patient on chronic doxycycline therapy being followed by Dr.  Graylon Good infectious disease.  Spinal Stenosis  -Bedbound being followed by neurosurgeon Dr. Kathyrn Sheriff. -Get PT/OT to further evaluate and treat  Chronic Normocytic Anemia -Paitient's Hgb/Hct went from 9.0/28.5 -> 9.0/28.4 -On B12 supplements. -Check Anemia Panel in the AM -Continue to Monitor for S/Sx of Bleeding; Currently no overt bleeding noted -Repeat CBC in AM   We will continue to monitor the patient's clinical response to intervention and repeat blood work and imaging in a.m.

## 2019-06-27 NOTE — ED Notes (Signed)
Meal tray given 

## 2019-06-27 NOTE — Progress Notes (Signed)
Report received from [place]. No acute changes noted with previous assessment. Maintain current plan of care.

## 2019-06-27 NOTE — TOC Initial Note (Signed)
Transition of Care [facility]) - Initial/Assessment Note    Patient Details  Name: Cody Mendoza MRN: [id number] Date of Birth: 26-Apr-1921  Transition of Care [facility]) CM/SW Contact:    [name], RN Phone Number: 06/27/2019, 2:18 PM  Clinical Narrative: confirmed patient is from [facility]-spoke to [place]-w/c bound d/c plan to return.                  Expected Discharge Plan: Home/Self Care Barriers to Discharge: Continued Medical Work up   Patient Goals and CMS Choice Patient states their goals for this hospitalization and ongoing recovery are:: return back to [facility]      Expected Discharge Plan and Services Expected Discharge Plan: Home/Self Care   Discharge Planning Services: CM Consult   Living arrangements for the past 2 months: [place]                                      Prior Living Arrangements/Services Living arrangements for the past 2 months: [place] Lives with:: Facility Resident Patient language and need for interpreter reviewed:: Yes Do you feel safe going back to the place where you live?: Yes      Need for Family Participation in Patient Care: No (Comment) Care giver support system in place?: Yes (comment) Current home services: DME(w/c) Criminal Activity/Legal Involvement Pertinent to Current Situation/Hospitalization: No - Comment as needed  Activities of Daily Living Home Assistive Devices/Equipment: Cane (specify quad or straight), Eyeglasses, Built-in shower seat, Other (Comment), Grab bars around toilet, Grab bars in shower, Hand-held shower hose(walk in shower, single point cane) ADL Screening (condition at time of admission) Patient's cognitive ability adequate to safely complete daily activities?: Yes Is the patient deaf or have difficulty hearing?: Yes Does the patient have difficulty seeing, even when wearing glasses/contacts?: No Does the patient have difficulty  concentrating, remembering, or making decisions?: No Patient able to express need for assistance with ADLs?: Yes Does the patient have difficulty dressing or bathing?: No Independently performs ADLs?: Yes (appropriate for developmental age) Does the patient have difficulty walking or climbing stairs?: Yes Weakness of Legs: Both Weakness of Arms/Hands: None  Permission Sought/Granted Permission sought to share information with : Case Manager Permission granted to share information with : Yes, Verbal Permission Granted  Share Information with NAME: Case Manager  Permission granted to share info w AGENCY: [facility]        Emotional Assessment Appearance:: Appears stated age Attitude/Demeanor/Rapport: Gracious Affect (typically observed): Accepting Orientation: : Oriented to Self, Oriented to  Time, Oriented to Place, Oriented to Situation Alcohol / Substance Use: Illicit Drugs Psych Involvement: No (comment)  Admission diagnosis:  SOB (shortness of breath) [R06.02] Acute CHF (congestive heart failure) (HCC) 0000000 Systolic congestive heart failure, unspecified HF chronicity ([place]) [I50.20] Patient Active Problem List   Diagnosis Date Noted  . Acute CHF (congestive heart failure) ([place]) 06/26/2019  . Lower extremity edema 09/13/2018  . Near syncope 08/28/2018  . Hyponatremia 08/28/2018  . TIA (transient ischemic attack) 08/27/2018  . Discitis of thoracic region 08/23/2018  . CKD (chronic kidney disease) stage 2, GFR 60-89 ml/min 08/23/2018  . Chronic anemia 08/23/2018  . Discitis 08/23/2018  . Puncture wound of right lower leg without foreign body 10/08/2013  . Mitral insufficiency 12/28/2010  . Atrial fibrillation ([place]) 11/16/2010  . Hypertension   . GERD 04/22/2010  . PERSONAL HX COLONIC POLYPS  04/22/2010   PCP:  Crist Infante, MD Pharmacy:   [facility] [address], [place] - [place] AT [place] [address] [place] [address] Phone: [phone] Fax: [phone]  [facility] [place], [place]en [place] [address] [place] [address] Phone: [phone] Fax: [phone]     Social Determinants of Health (SDOH) Interventions    Readmission Risk Interventions No flowsheet data found.

## 2019-06-27 NOTE — ED Provider Notes (Signed)
[facility] DEPT Provider Note   CSN: [id number] Arrival date & time: 06/26/19  2014     History No chief complaint on file.   Cody Mendoza is a 86 y.o. male.  Patient presents with swelling to his legs and all the way up into his scrotum.  Patient has a history of atrial fib hypertension mitral insufficiency.  Patient states for 4 months he has been having swelling in his legs.  Much worse recently he also complains of some shortness of breath  The history is provided by the patient. No language interpreter was used.  Weakness Severity:  Moderate Onset quality:  Sudden Timing:  Constant Progression:  Worsening Chronicity:  New Context: not alcohol use   Relieved by:  Nothing Worsened by:  Nothing Ineffective treatments:  None tried Associated symptoms: no abdominal pain, no chest pain, no cough, no diarrhea, no frequency, no headaches and no seizures        Past Medical History:  Diagnosis Date  . AF (atrial fibrillation) ([place])   . Asthma   . Detached retina   . Discitis of thoracic region 08/2018  . Diverticulosis   . GERD (gastroesophageal reflux disease)   . [name] syndrome   . Hypertension   . Lactose intolerance   . Mitral insufficiency   . Renal calculi   . TIA (transient ischemic attack)     Patient Active Problem List   Diagnosis Date Noted  . Acute CHF (congestive heart failure) ([place]) 06/26/2019  . Lower extremity edema 09/13/2018  . Near syncope 08/28/2018  . Hyponatremia 08/28/2018  . TIA (transient ischemic attack) 08/27/2018  . Discitis of thoracic region 08/23/2018  . CKD (chronic kidney disease) stage 2, GFR 60-89 ml/min 08/23/2018  . Chronic anemia 08/23/2018  . Discitis 08/23/2018  . Puncture wound of right lower leg without foreign body 10/08/2013  . Mitral insufficiency 12/28/2010  . Atrial fibrillation ([place]) 11/16/2010  . Hypertension   . GERD 04/22/2010  . PERSONAL HX COLONIC POLYPS 04/22/2010      Past Surgical History:  Procedure Laterality Date  . cataract surgery    . ESOPHAGEAL DILATION     In the past  . FINGER TENDON REPAIR    . INGUINAL HERNIA REPAIR    . KIDNEY STONE SURGERY     Status post syrgical removal of a   . TONSILLECTOMY         Family History  Problem Relation Age of Onset  . Hypertension Mother     Social History   Tobacco Use  . Smoking status: Never Smoker  . Smokeless tobacco: Never Used  Substance Use Topics  . Alcohol use: Yes    Alcohol/week: 0.0 standard drinks    Comment: rarely  . Drug use: Never    Home Medications Prior to Admission medications   Medication Sig Start Date End Date Taking? Authorizing Provider  amLODipine (NORVASC) 5 MG tablet Take 1 tablet (5 mg total) by mouth daily. Patient taking differently: Take 5 mg by mouth every evening.  02/21/19 06/26/19 Yes Curatolo, Adam, DO  apixaban (ELIQUIS) 5 MG TABS tablet Take 1 tablet (5 mg total) by mouth 2 (two) times daily. 09/17/18  Yes Martinique, Peter M, MD  carvedilol (COREG) 3.125 MG tablet Take 1 tablet (3.125 mg total) by mouth 2 (two) times daily with a meal. 08/27/18  Yes Hongalgi, Lenis Dickinson, MD  Cyanocobalamin (VITAMIN B12) 500 MCG TABS Take 500 mcg by mouth every morning.  Yes [provider]  doxazosin (CARDURA) 4 MG tablet Take 0.5 tablets (2 mg total) by mouth daily. 08/28/18  Yes Hongalgi, Lenis Dickinson, MD  doxycycline (VIBRA-TABS) 100 MG tablet TAKE 1 TABLET(100 MG) BY MOUTH TWICE DAILY Patient taking differently: Take 100 mg by mouth 2 (two) times daily.  12/24/18  Yes Carlyle Basques, MD  Lactobacillus (PROBIOTIC ACIDOPHILUS PO) Take 1 capsule by mouth daily with supper.    Yes [provider]  latanoprost (XALATAN) 0.005 % ophthalmic solution Place 1 drop into both eyes at bedtime.  09/05/17  Yes [provider]  losartan (COZAAR) 100 MG tablet Take 50 mg by mouth 2 (two) times daily.    Yes [provider]  Multiple Vitamin  (MULTIVITAMIN WITH MINERALS) TABS Take 1 tablet by mouth daily with supper.    Yes [provider]  Multiple Vitamins-Minerals (ICAPS AREDS FORMULA PO) Take 1 capsule by mouth 2 (two) times daily with breakfast and lunch.    Yes [provider]  pantoprazole (PROTONIX) 40 MG tablet Take 40 mg by mouth every morning.    Yes [provider]  Polyethyl Glycol-Propyl Glycol (SYSTANE) 0.4-0.3 % SOLN Place 1 drop into both eyes at bedtime.    Yes [provider]    Allergies    Patient has no known allergies.  Review of Systems   Review of Systems  Constitutional: Negative for appetite change and fatigue.  HENT: Negative for congestion, ear discharge and sinus pressure.   Eyes: Negative for discharge.  Respiratory: Negative for cough.   Cardiovascular: Negative for chest pain.  Gastrointestinal: Negative for abdominal pain and diarrhea.  Genitourinary: Negative for frequency and hematuria.  Musculoskeletal: Negative for back pain.       Swelling both legs  Skin: Negative for rash.  Neurological: Positive for weakness. Negative for seizures and headaches.  Psychiatric/Behavioral: Negative for hallucinations.    Physical Exam Updated Vital Signs BP (!) 166/73   Pulse 81   Temp (!) 97.5 F (36.4 C) (Oral)   Resp 19   SpO2 96%   Physical Exam Vitals and nursing note reviewed.  Constitutional:      Appearance: He is well-developed.  HENT:     Head: Normocephalic.     Nose: Nose normal.  Eyes:     General: No scleral icterus.    Conjunctiva/sclera: Conjunctivae normal.  Neck:     Thyroid: No thyromegaly.  Cardiovascular:     Rate and Rhythm: Normal rate and regular rhythm.     Heart sounds: No murmur. No friction rub. No gallop.   Pulmonary:     Breath sounds: No stridor. Wheezing present. No rales.  Chest:     Chest wall: No tenderness.  Abdominal:     General: There is no distension.     Tenderness: There is no abdominal tenderness.  There is no rebound.  Musculoskeletal:        General: Normal range of motion.     Cervical back: Neck supple.     Comments: 3+ edema all the way up both legs along with edema in his scrotum  Lymphadenopathy:     Cervical: No cervical adenopathy.  Skin:    Findings: No erythema or rash.  Neurological:     Mental Status: He is alert and oriented to person, place, and time.     Motor: No abnormal muscle tone.     Coordination: Coordination normal.  Psychiatric:        Behavior: Behavior normal.  ED Results / Procedures / Treatments   Labs (all labs ordered are listed, but only abnormal results are displayed) Labs Reviewed  CBC WITH DIFFERENTIAL/PLATELET - Abnormal; Notable for the following components:      Result Value   WBC 11.2 (*)    RBC 2.96 (*)    Hemoglobin 9.0 (*)    HCT 28.5 (*)    Monocytes Absolute 1.4 (*)    Eosinophils Absolute 0.8 (*)    All other components within normal limits  COMPREHENSIVE METABOLIC PANEL - Abnormal; Notable for the following components:   Glucose, Bld 114 (*)    BUN 36 (*)    Calcium 8.8 (*)    Albumin 3.3 (*)    Alkaline Phosphatase 127 (*)    GFR calc non Af Amer 52 (*)    GFR calc Af Amer 60 (*)    All other components within normal limits  RESPIRATORY PANEL BY RT PCR (FLU A&B, COVID)  BRAIN NATRIURETIC PEPTIDE    EKG EKG Interpretation  Date/Time:  Wednesday June 26 2019 21:21:59 EDT Ventricular Rate:  79 PR Interval:    QRS Duration: 87 QT Interval:  411 QTC Calculation: 472 R Axis:   38 Text Interpretation: Atrial fibrillation Ventricular premature complex Anterior infarct, old Artifact in lead(s) I II III aVR aVL V1 V2 Confirmed by Milton Ferguson [phone]) on 06/26/2019 11:30:10 PM   Radiology DG Chest Port 1 View  Result Date: 06/26/2019 CLINICAL DATA:  Shortness of breath, wheezing EXAM: PORTABLE CHEST 1 VIEW COMPARISON:  02/21/2019 FINDINGS: Cardiomegaly, vascular congestion. Probable layering bilateral effusions  with bibasilar atelectasis or infiltrates. No overt edema. No acute bony abnormality. Aortic atherosclerosis. IMPRESSION: Cardiomegaly with vascular congestion. Layering bilateral effusions with bibasilar atelectasis or infiltrates. Electronically Signed   By: Rolm Baptise M.D.   On: 06/26/2019 21:46    Procedures Procedures (including critical care time)  Medications Ordered in ED Medications  furosemide (LASIX) injection 40 mg (40 mg Intravenous Given 06/26/19 2245)    ED Course  I have reviewed the triage vital signs and the nursing notes.  Pertinent labs & imaging results that were available during my care of the patient were reviewed by me and considered in my medical decision making (see chart for details).    MDM Rules/Calculators/A&P                      Patient with anasarca and congestive heart failure seen on chest x-ray.  He will be admitted to medicine for diuresis    This patient presents to the ED for concern of swelling in legs, this involves an extensive number of treatment options, and is a complaint that carries with it a high risk of complications and morbidity.  The differential diagnosis includes DVT heart failure   Lab Tests:  I Ordered, reviewed, and interpreted labs, which included CBC chemistries BNP which is showed patient anemic Medicines ordered:   I ordered medication Lasix for edema  Imaging Studies ordered:   I ordered imaging studies which included chest x-ray and  I independently visualized and interpreted imaging which showed congestive heart failure  Additional history obtained:   Additional history obtained from son  Previous records obtained and reviewed   Consultations Obtained:   I consulted hospitalist and discussed lab and imaging findings  Reevaluation:  After the interventions stated above, I reevaluated the patient and found improved after Lasix  Critical Interventions:  .   Final Clinical Impression(s) / ED  Diagnoses Final diagnoses:  None    Rx / DC Orders ED Discharge Orders    None       Milton Ferguson, MD 06/27/19 0004

## 2019-06-28 ENCOUNTER — Inpatient Hospital Stay (HOSPITAL_COMMUNITY): Payer: Medicare Other

## 2019-06-28 DIAGNOSIS — I4819 Other persistent atrial fibrillation: Secondary | ICD-10-CM

## 2019-06-28 DIAGNOSIS — I509 Heart failure, unspecified: Secondary | ICD-10-CM

## 2019-06-28 LAB — IRON AND TIBC
Iron: 22 ug/dL — ABNORMAL LOW (ref 45–182)
Saturation Ratios: 11 % — ABNORMAL LOW (ref 17.9–39.5)
TIBC: 193 ug/dL — ABNORMAL LOW (ref 250–450)
UIBC: 171 ug/dL

## 2019-06-28 LAB — CBC WITH DIFFERENTIAL/PLATELET
Abs Immature Granulocytes: 0.06 10*3/uL (ref 0.00–0.07)
Basophils Absolute: 0.1 10*3/uL (ref 0.0–0.1)
Basophils Relative: 1 %
Eosinophils Absolute: 0.1 10*3/uL (ref 0.0–0.5)
Eosinophils Relative: 1 %
HCT: 26.4 % — ABNORMAL LOW (ref 39.0–52.0)
Hemoglobin: 8.3 g/dL — ABNORMAL LOW (ref 13.0–17.0)
Immature Granulocytes: 1 %
Lymphocytes Relative: 10 %
Lymphs Abs: 1.2 10*3/uL (ref 0.7–4.0)
MCH: 30.1 pg (ref 26.0–34.0)
MCHC: 31.4 g/dL (ref 30.0–36.0)
MCV: 95.7 fL (ref 80.0–100.0)
Monocytes Absolute: 1.7 10*3/uL — ABNORMAL HIGH (ref 0.1–1.0)
Monocytes Relative: 14 %
Neutro Abs: 9 10*3/uL — ABNORMAL HIGH (ref 1.7–7.7)
Neutrophils Relative %: 73 %
Platelets: 235 10*3/uL (ref 150–400)
RBC: 2.76 MIL/uL — ABNORMAL LOW (ref 4.22–5.81)
RDW: 14.5 % (ref 11.5–15.5)
WBC: 12.1 10*3/uL — ABNORMAL HIGH (ref 4.0–10.5)
nRBC: 0 % (ref 0.0–0.2)

## 2019-06-28 LAB — COMPREHENSIVE METABOLIC PANEL
ALT: 17 U/L (ref 0–44)
AST: 23 U/L (ref 15–41)
Albumin: 2.9 g/dL — ABNORMAL LOW (ref 3.5–5.0)
Alkaline Phosphatase: 99 U/L (ref 38–126)
Anion gap: 13 (ref 5–15)
BUN: 38 mg/dL — ABNORMAL HIGH (ref 8–23)
CO2: 28 mmol/L (ref 22–32)
Calcium: 8.2 mg/dL — ABNORMAL LOW (ref 8.9–10.3)
Chloride: 97 mmol/L — ABNORMAL LOW (ref 98–111)
Creatinine, Ser: 1.62 mg/dL — ABNORMAL HIGH (ref 0.61–1.24)
GFR calc Af Amer: 41 mL/min — ABNORMAL LOW (ref >60)
GFR calc non Af Amer: 36 mL/min — ABNORMAL LOW (ref >60)
Glucose, Bld: 104 mg/dL — ABNORMAL HIGH (ref 70–99)
Potassium: 3.4 mmol/L — ABNORMAL LOW (ref 3.5–5.1)
Sodium: 138 mmol/L (ref 135–145)
Total Bilirubin: 1.3 mg/dL — ABNORMAL HIGH (ref 0.3–1.2)
Total Protein: 6.1 g/dL — ABNORMAL LOW (ref 6.5–8.1)

## 2019-06-28 LAB — FOLATE: Folate: 29.4 ng/mL (ref >5.9)

## 2019-06-28 LAB — RETICULOCYTES
Immature Retic Fract: 10.3 % (ref 2.3–15.9)
RBC.: 2.7 MIL/uL — ABNORMAL LOW (ref 4.22–5.81)
Retic Count, Absolute: 44 10*3/uL (ref 19.0–186.0)
Retic Ct Pct: 1.6 % (ref 0.4–3.1)

## 2019-06-28 LAB — PHOSPHORUS: Phosphorus: 3.9 mg/dL (ref 2.5–4.6)

## 2019-06-28 LAB — MAGNESIUM: Magnesium: 1.3 mg/dL — ABNORMAL LOW (ref 1.7–2.4)

## 2019-06-28 LAB — VITAMIN B12: Vitamin B-12: 898 pg/mL (ref 180–914)

## 2019-06-28 LAB — FERRITIN: Ferritin: 281 ng/mL (ref 24–336)

## 2019-06-28 MED ORDER — MAGNESIUM SULFATE IN D5W 1-5 GM/100ML-% IV SOLN
1.0000 g | Freq: Once | INTRAVENOUS | Status: AC
  Administered 2019-06-28: 1 g via INTRAVENOUS
  Filled 2019-06-28: qty 100

## 2019-06-28 MED ORDER — ACETAMINOPHEN 325 MG PO TABS
650.0000 mg | ORAL_TABLET | Freq: Four times a day (QID) | ORAL | Status: DC | PRN
  Administered 2019-06-28 – 2019-07-01 (×4): 650 mg via ORAL
  Filled 2019-06-28 (×5): qty 2

## 2019-06-28 MED ORDER — MAGNESIUM SULFATE 2 GM/50ML IV SOLN
2.0000 g | Freq: Once | INTRAVENOUS | Status: AC
  Administered 2019-06-28: 2 g via INTRAVENOUS
  Filled 2019-06-28: qty 50

## 2019-06-28 MED ORDER — POLYVINYL ALCOHOL 1.4 % OP SOLN
1.0000 [drp] | Freq: Every day | OPHTHALMIC | Status: DC
  Administered 2019-06-28 – 2019-07-06 (×9): 1 [drp] via OPHTHALMIC
  Filled 2019-06-28: qty 15

## 2019-06-28 MED ORDER — POTASSIUM CHLORIDE CRYS ER 20 MEQ PO TBCR
40.0000 meq | EXTENDED_RELEASE_TABLET | Freq: Two times a day (BID) | ORAL | Status: AC
  Administered 2019-06-28 (×2): 40 meq via ORAL
  Filled 2019-06-28 (×2): qty 2

## 2019-06-28 MED ORDER — POTASSIUM CHLORIDE CRYS ER 20 MEQ PO TBCR: 40.0000 meq | EXTENDED_RELEASE_TABLET | Freq: Two times a day (BID) | ORAL | Status: DC

## 2019-06-28 NOTE — Progress Notes (Signed)
PROGRESS NOTE    Cody Mendoza  [id number] DOB: 1921/04/05 DOA: 06/26/2019 PCP: Crist Infante, MD   Brief Narrative:  The patient is a elderly generally bedbound 86 year old Caucasian male with a past medical history significant for but not limited to atrial fibrillation, hypertension, mitral valve insufficiency, GERD, history of [name] syndrome, history of TIA.  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  He is admitted for acute diastolic CHF and was placed on IV Lasix 40 mg every 12.  Lower extremity duplex was done and showed DVT in the lower extremities bilaterally.  On admission his BNP was elevated to 165.6.  ECHOCARDIOGRAM is to be repeated and showed an EF of 55 to 60% with no wall motion abnormalities and mildly reduced right ventricular systolic function.  Left ventricular diastolic parameters were indeterminate.  Subsequently the patient had an AKI after diuresis and his ARB so this was been held.  Cardiology is consulted for further evaluation recommendations and appreciate their assistance in management.  Cardiology recommends holding Lasix for today and reevaluate you the patient still has some pulmonary interstitial edema noted as well as some Lower extremity swelling  Assessment & Plan:   Principal Problem:   Acute CHF (congestive heart failure) (HCC) Active Problems:   Hypertension   Atrial fibrillation (HCC)   Discitis of thoracic region   CKD (chronic kidney disease) stage 2, GFR 60-89 ml/min   TIA (transient ischemic attack)  Acute on chronic diastolic CHF and right-sided heart failure -last EF measured in 2020 was 60 to 65%.  -BNP on Admission was 165.6 and presented with lower extremity swelling as well as scrotal swelling -Initially was placed Lasix 40 mg IV every 12h after  being given 1 dose of 40 mg of Lasix in the ED but have held it given AKI -closely follow intake output metabolic panel -Patient is -3.570 Liters since Admission and weight is down 5 pounds -C/w Carvedilol 3.125 mg po BID; Losartan 50 mg po BID now held due AKI -CXR this AM is showing "Cardiomegaly with suspected pulmonary interstitial edema and small pleural effusions. Stable ventilation since 06/26/2019" -Check 2D echo showed an EF of 55 to 60% with indeterminate left ventricular diastolic parameters and mildly reduced right-sided systolic dysfunction -Given that patient is bedbound will check Dopplers of the lower extremity to rule out DVT and this was Negative  -Cardiology recommends continuing to hold Lasix for today and reevaluate renal function tomorrow for IV Lasix; they suspect the patient has been getting more salt in his diet since moving to this facility.  They also feel that he needs Lasix in outpatient setting -Continue to monitor signs and symptoms of volume overload and repeat chest x-ray in the a.m.  Chronic A. Fib  -Patient is on Carvedilol 3.125 mg po BID and Apixaban 5 mg po BID -Continue to monitor on telemetry carefully.  Hypertension  -C/w Carvedilol 3.125 mg po BID, Losartan 50 mg po BID and Amlodipine 5 mg po Daily    T8-T9 Discitis  -for which patient on chronic doxycycline therapy being followed by Dr. Graylon Good infectious disease.  Spinal Stenosis  -Bedbound being followed by neurosurgeon Dr. Kathyrn Sheriff. -Get PT/OT to further evaluate and treat  Chronic Normocytic Anemia -Paitient's Hgb/Hct went from 9.0/28.5 -> 9.0/28.4 -> 8.3/26.4 -On B12 supplements with cyanocobalamin 500 mcg  every morning orally -Check Anemia Panel; it showed an iron level of 22, U IBC of 171, TIBC 193, saturation ratios of 11%, ferritin level of 281, folate level of 29.4, and vitamin B12 898 -Continue to Monitor for S/Sx of Bleeding; Currently no overt bleeding noted -Repeat CBC in AM     Leukocytosis  -Mild and Likely Reactive -Currently Afebrile with a TMax of 99.3 -Already on antibiotics with chronic doxycycline for his T8/T9 infection and discitis -Continue to Monitor for S/Sx of Infection -Repeat CBC in AM   Hypokalemia -In the Setting of Diuresis  -K+ was 3.4 this AM -Replete with po KCl 40 mEQ BID x2 Doses; currently getting 20 mEq daily with diuresis -Continue to Monitor and Replete as Necessary -Repeat CMP in AM   Hypomagnesemia -Patient magnesium level this morning was 1.3 -Replete with IV mag sulfate 3 g -Continue to monitor replete as necessary -Repeat magnesium level in a.m.  AKI on CKD Stage 2/3 -Patient's BUNs/creatinine from 35/1.14 and worsened to 38/1.62 in the setting of diuresis and his ARB -Holding losartan 50 mg p.o. twice daily as well as his Lasix -Avoid nephrotoxic medications, contrast dyes, hypotension and renally dose medications -Continue to monitor and trend renal function carefully -Repeat CMP in the a.m.  Hyperbilirubinemia -Mild with a total bilirubin of 1.3 -Continue monitor and trend and repeat CMP in a.m.   DVT prophylaxis: Anticoagulated with Apixaban Code Status: DO NOT RESUSCITATE  Family Communication: Discussed with Son at Bedside  Disposition Plan:   Status is: Inpatient  Remains inpatient appropriate because:IV treatments appropriate due to intensity of illness or inability to take PO and Inpatient level of care appropriate due to severity of illness   Dispo: The patient is from: ALF              Anticipated d/c is to: ALF              Anticipated d/c date is: 2 days              Patient currently is not medically stable to d/c.  Consultants:   Cardiology    Procedures:  ECHOCARDIOGRAM IMPRESSIONS    1. Left ventricular ejection fraction, by estimation, is 55 to 60%. The  left ventricle has normal function. The left ventricle has no regional  wall motion abnormalities. Left ventricular diastolic  parameters are  indeterminate.  2. Right ventricular systolic function is mildly reduced. The right  ventricular size is normal. There is mildly elevated pulmonary artery  systolic pressure. The estimated right ventricular systolic pressure is  [id number] mmHg.  3. Left atrial size was moderately dilated.  4. Right atrial size was mildly dilated.  5. The mitral valve is normal in structure. Trivial mitral valve  regurgitation. No evidence of mitral stenosis.  6. The aortic valve is tricuspid. Aortic valve regurgitation is not  visualized. Mild aortic valve sclerosis is present, with no evidence of  aortic valve stenosis.  7. The inferior vena cava is dilated in size with >50% respiratory  variability, suggesting right atrial pressure of 8 mmHg.  8. The patient is in atrial fibrillation.   FINDINGS  Left Ventricle: Left ventricular ejection fraction, by estimation, is 55  to 60%. The left ventricle has normal function. The left ventricle has no  regional wall motion abnormalities. The left ventricular internal cavity  size was normal in size. There is  no left ventricular hypertrophy. Left ventricular diastolic parameters  are indeterminate.   Right Ventricle: The right  ventricular size is normal. No increase in  right ventricular wall thickness. Right ventricular systolic function is  mildly reduced. There is mildly elevated pulmonary artery systolic  pressure. The tricuspid regurgitant velocity  is 2.43 m/s, and with an assumed right atrial pressure of 8 mmHg, the  estimated right ventricular systolic pressure is [id number] mmHg.   Left Atrium: Left atrial size was moderately dilated.   Right Atrium: Right atrial size was mildly dilated.   Pericardium: Trivial pericardial effusion is present.   Mitral Valve: The mitral valve is normal in structure. Trivial mitral  valve regurgitation. No evidence of mitral valve stenosis.   Tricuspid Valve: The tricuspid valve is normal in  structure. Tricuspid  valve regurgitation is trivial.   Aortic Valve: The aortic valve is tricuspid. Aortic valve regurgitation is  not visualized. Mild aortic valve sclerosis is present, with no evidence  of aortic valve stenosis.   Pulmonic Valve: The pulmonic valve was normal in structure. Pulmonic valve  regurgitation is not visualized.   Aorta: The aortic root is normal in size and structure.   Venous: The inferior vena cava is dilated in size with greater than 50%  respiratory variability, suggesting right atrial pressure of 8 mmHg.   IAS/Shunts: No atrial level shunt detected by color flow Doppler.     LEFT VENTRICLE  PLAX 2D  LVIDd:     5.00 cm  LVIDs:     3.63 cm  LV PW:     0.92 cm  LV IVS:    0.96 cm  LVOT diam:   2.00 cm  LV SV:     64  LV SV Index:  36  LVOT Area:   3.14 cm     RIGHT VENTRICLE  RV S prime:   6.85 cm/s   LEFT ATRIUM       Index    RIGHT ATRIUM      Index  LA diam:    4.30 cm 2.44 cm/m RA Area:   21.70 cm  LA Vol (A2C):  98.0 ml 55.55 ml/m RA Volume:  60.50 ml 34.29 ml/m  LA Vol (A4C):  63.0 ml 35.71 ml/m  LA Biplane Vol: 82.2 ml 46.59 ml/m  AORTIC VALVE  LVOT Vmax:  111.93 cm/s  LVOT Vmean: 79.000 cm/s  LVOT VTI:  0.202 m    AORTA  Ao Root diam: 3.10 cm   MITRAL VALVE        TRICUSPID VALVE  MV Area (PHT): 3.48 cm   TR Peak grad:  23.6 mmHg  MV Decel Time: 218 msec   TR Vmax:    243.00 cm/s  MV E velocity: 108.00 cm/s  MV A velocity: 51.40 cm/s  SHUNTS  MV E/A ratio: 2.10     Systemic VTI: 0.20 m               Systemic Diam: 2.00 cm   Antimicrobials:  Anti-infectives (From admission, onward)   Start     Dose/Rate Route Frequency Ordered Stop   06/27/19 1000  doxycycline (VIBRA-TABS) tablet 100 mg     100 mg Oral Every 12 hours 06/27/19 0400       Subjective: Seen and examined at bedside and states that shortness of breath  is little bit better.  Still has some swelling in his legs.  No nausea or vomiting.  States that he slept okay.  Feels much better than he did yesterday.  No other concerns or complaints at this time.  Objective: Vitals:  06/28/19 0150 06/28/19 0506 06/28/19 0506 06/28/19 0858  BP: 137/74  123/61 138/72  Pulse: 88  83 83  Resp: 20  (!) 21 18  Temp: 97.8 F (36.6 C)  98.5 F (36.9 C) 99.3 F (37.4 C)  TempSrc: Oral  Oral Oral  SpO2: 94%  95% 93%  Weight:  74.5 kg    Height:        Intake/Output Summary (Last 24 hours) at 06/28/2019 1208 Last data filed at 06/28/2019 1100 Gross per 24 hour  Intake 780 ml  Output 3500 ml  Net -2720 ml   Filed Weights   06/27/19 1358 06/28/19 0506  Weight: 77.1 kg 74.5 kg   Examination: Physical Exam:  Constitutional: WN/WD elderly overweight Caucasian male in NAD and appears calm  Eyes: Lids and conjunctivae normal, sclerae anicteric  ENMT: External Ears, Nose appear normal.  Mildly hard of hearing Neck: Appears normal, supple, no cervical masses, normal ROM, no appreciable thyromegaly: No JVD Respiratory: Diminished to auscultation bilaterally with coarse breath sounds but no appreciable wheezing, rales, rhonchi.  Has very slight crackles normal respiratory effort and patient is not tachypenic. No accessory muscle use.  Unlabored breathing and not wearing any supplemental oxygen via nasal cannula Cardiovascular: Irregularly irregular, no murmurs / rubs / gallops. S1 and S2 auscultated.  1+ extremity edema.  Abdomen: Soft, non-tender, mildly distended. Bowel sounds positive.  GU: Deferred. Musculoskeletal: No clubbing / cyanosis of digits/nails. No joint deformity upper and lower extremities.  Skin: No rashes, lesions, ulcers on limited skin evaluation. No induration; Warm and dry.  Neurologic: CN 2-12 grossly intact with no focal deficits. Romberg sign and cerebellar reflexes not assessed.  Psychiatric: Normal judgment and insight. Alert and  oriented x 3. Normal mood and appropriate affect.   Data Reviewed: I have personally reviewed following labs and imaging studies  CBC: Recent Labs  Lab 06/26/19 2203 06/27/19 0542 06/28/19 0417  WBC 11.2* 10.2 12.1*  NEUTROABS 7.5  --  9.0*  HGB 9.0* 9.0* 8.3*  HCT 28.5* 28.4* 26.4*  MCV 96.3 95.6 95.7  PLT 259 258 [id number]   Basic Metabolic Panel: Recent Labs  Lab 06/26/19 2203 06/27/19 0542 06/28/19 0417  NA 140 138 138  K 4.0 4.8 3.4*  CL 108 105 97*  CO2 22 25 28   GLUCOSE 114* 87 104*  BUN 36* 35* 38*  CREATININE 1.19 1.14 1.62*  CALCIUM 8.8* 8.7* 8.2*  MG  --   --  1.3*  PHOS  --   --  3.9   GFR: Estimated Creatinine Clearance: 25.5 mL/min (A) (by C-G formula based on SCr of 1.62 mg/dL (H)). Liver Function Tests: Recent Labs  Lab 06/26/19 2203 06/28/19 0417  AST 26 23  ALT 23 17  ALKPHOS 127* 99  BILITOT 0.8 1.3*  PROT 6.8 6.1*  ALBUMIN 3.3* 2.9*   No results for input(s): LIPASE, AMYLASE in the last 168 hours. No results for input(s): AMMONIA in the last 168 hours. Coagulation Profile: No results for input(s): INR, PROTIME in the last 168 hours. Cardiac Enzymes: No results for input(s): CKTOTAL, CKMB, CKMBINDEX, TROPONINI in the last 168 hours. BNP (last 3 results) No results for input(s): PROBNP in the last 8760 hours. HbA1C: No results for input(s): HGBA1C in the last 72 hours. CBG: No results for input(s): GLUCAP in the last 168 hours. Lipid Profile: No results for input(s): CHOL, HDL, LDLCALC, TRIG, CHOLHDL, LDLDIRECT in the last 72 hours. Thyroid Function Tests: No results for input(s): TSH, T4TOTAL,  FREET4, T3FREE, THYROIDAB in the last 72 hours. Anemia Panel: Recent Labs    06/28/19 0417  VITAMINB12 898  FOLATE 29.4  FERRITIN 281  TIBC 193*  IRON 22*  RETICCTPCT 1.6   Sepsis Labs: No results for input(s): PROCALCITON, LATICACIDVEN in the last 168 hours.  Recent Results (from the past 240 hour(s))  Respiratory Panel by RT PCR (Flu  A&B, Covid) - Nasopharyngeal Swab     Status: None   Collection Time: 06/27/19 12:37 AM   Specimen: Nasopharyngeal Swab  Result Value Ref Range Status   SARS Coronavirus 2 by RT PCR NEGATIVE NEGATIVE Final    Comment: (NOTE) SARS-CoV-2 target nucleic acids are NOT DETECTED. The SARS-CoV-2 RNA is generally detectable in upper respiratoy specimens during the acute phase of infection. The lowest concentration of SARS-CoV-2 viral copies this assay can detect is 131 copies/mL. A negative result does not preclude SARS-Cov-2 infection and should not be used as the sole basis for treatment or other patient management decisions. A negative result may occur with  improper specimen collection/handling, submission of specimen other than nasopharyngeal swab, presence of viral mutation(s) within the areas targeted by this assay, and inadequate number of viral copies (<131 copies/mL). A negative result must be combined with clinical observations, patient history, and epidemiological information. The expected result is Negative. Fact Sheet for Patients:  PinkCheek.be Fact Sheet for Healthcare Providers:  GravelBags.it This test is not yet ap proved or cleared by the [place] FDA and  has been authorized for detection and/or diagnosis of SARS-CoV-2 by FDA under an Emergency Use Authorization (EUA). This EUA will remain  in effect (meaning this test can be used) for the duration of the COVID-19 declaration under Section 564(b)(1) of the Act, 21 U.S.C. section 360bbb-3(b)(1), unless the authorization is terminated or revoked sooner.    Influenza A by PCR NEGATIVE NEGATIVE Final   Influenza B by PCR NEGATIVE NEGATIVE Final    Comment: (NOTE) The Xpert Xpress SARS-CoV-2/FLU/RSV assay is intended as an aid in  the diagnosis of influenza from Nasopharyngeal swab specimens and  should not be used as a sole basis for treatment. Nasal washings  and  aspirates are unacceptable for Xpert Xpress SARS-CoV-2/FLU/RSV  testing. Fact Sheet for Patients: PinkCheek.be Fact Sheet for Healthcare Providers: GravelBags.it This test is not yet approved or cleared by the [place] FDA and  has been authorized for detection and/or diagnosis of SARS-CoV-2 by  FDA under an Emergency Use Authorization (EUA). This EUA will remain  in effect (meaning this test can be used) for the duration of the  Covid-19 declaration under Section 564(b)(1) of the Act, 21  U.S.C. section 360bbb-3(b)(1), unless the authorization is  terminated or revoked. Performed at [facility], [place] [address], [place], [place] [address]      RN Pressure Injury Documentation:     Estimated body mass index is 25.73 kg/m as calculated from the following:   Height as of this encounter: 5\' 7"  (1.702 m).   Weight as of this encounter: 74.5 kg.  Malnutrition Type:      Malnutrition Characteristics:      Nutrition Interventions:    Radiology Studies: DG CHEST PORT 1 VIEW  Result Date: 06/28/2019 CLINICAL DATA:  86 year old male with shortness of breath. Lower extremity edema. EXAM: PORTABLE CHEST 1 VIEW COMPARISON:  Portable chest 06/26/2019 and earlier. FINDINGS: Portable AP semi upright view at 0558 hours. Stable cardiomegaly and mediastinal contours. Stable lung volumes. Bilateral pulmonary interstitial opacity and  mild bibasilar veiling opacity is stable. No superimposed pneumothorax. No consolidation identified. Paucity of bowel gas in the upper abdomen. Visualized tracheal air column is within normal limits. No acute osseous abnormality identified. IMPRESSION: Cardiomegaly with suspected pulmonary interstitial edema and small pleural effusions. Stable ventilation since 06/26/2019. Electronically Signed   By: Genevie Ann M.D.   On: 06/28/2019 07:14   DG Chest Port 1 View  Result Date:  06/26/2019 CLINICAL DATA:  Shortness of breath, wheezing EXAM: PORTABLE CHEST 1 VIEW COMPARISON:  02/21/2019 FINDINGS: Cardiomegaly, vascular congestion. Probable layering bilateral effusions with bibasilar atelectasis or infiltrates. No overt edema. No acute bony abnormality. Aortic atherosclerosis. IMPRESSION: Cardiomegaly with vascular congestion. Layering bilateral effusions with bibasilar atelectasis or infiltrates. Electronically Signed   By: Rolm Baptise M.D.   On: 06/26/2019 21:46   ECHOCARDIOGRAM COMPLETE  Result Date: 06/27/2019    ECHOCARDIOGRAM REPORT   Patient Name:   AMEDEE AMOAH Date of Exam: 06/27/2019 Medical Rec #:  [id number]          Height:       67.0 in Accession #:    [id number]         Weight:       145.1 lb Date of Birth:  1922/02/08          BSA:          1.764 m Patient Age:    89 years           BP:           122/97 mmHg Patient Gender: M                  HR:           75 bpm. Exam Location:  Inpatient Procedure: 2D Echo Indications:    Congestive Heart Failure 428.0 / I50.9  History:        Patient has prior history of Echocardiogram examinations, most                 recent 08/26/2018. CHF, TIA; Risk Factors:Hypertension. CKD.  Sonographer:    Vikki Ports Turrentine Referring Phys: [place]  1. Left ventricular ejection fraction, by estimation, is 55 to 60%. The left ventricle has normal function. The left ventricle has no regional wall motion abnormalities. Left ventricular diastolic parameters are indeterminate.  2. Right ventricular systolic function is mildly reduced. The right ventricular size is normal. There is mildly elevated pulmonary artery systolic pressure. The estimated right ventricular systolic pressure is [id number] mmHg.  3. Left atrial size was moderately dilated.  4. Right atrial size was mildly dilated.  5. The mitral valve is normal in structure. Trivial mitral valve regurgitation. No evidence of mitral stenosis.  6. The aortic valve is  tricuspid. Aortic valve regurgitation is not visualized. Mild aortic valve sclerosis is present, with no evidence of aortic valve stenosis.  7. The inferior vena cava is dilated in size with >50% respiratory variability, suggesting right atrial pressure of 8 mmHg.  8. The patient is in atrial fibrillation. FINDINGS  Left Ventricle: Left ventricular ejection fraction, by estimation, is 55 to 60%. The left ventricle has normal function. The left ventricle has no regional wall motion abnormalities. The left ventricular internal cavity size was normal in size. There is  no left ventricular hypertrophy. Left ventricular diastolic parameters are indeterminate. Right Ventricle: The right ventricular size is normal. No increase in right ventricular wall thickness. Right ventricular systolic function is mildly reduced. There  is mildly elevated pulmonary artery systolic pressure. The tricuspid regurgitant velocity  is 2.43 m/s, and with an assumed right atrial pressure of 8 mmHg, the estimated right ventricular systolic pressure is [id number] mmHg. Left Atrium: Left atrial size was moderately dilated. Right Atrium: Right atrial size was mildly dilated. Pericardium: Trivial pericardial effusion is present. Mitral Valve: The mitral valve is normal in structure. Trivial mitral valve regurgitation. No evidence of mitral valve stenosis. Tricuspid Valve: The tricuspid valve is normal in structure. Tricuspid valve regurgitation is trivial. Aortic Valve: The aortic valve is tricuspid. Aortic valve regurgitation is not visualized. Mild aortic valve sclerosis is present, with no evidence of aortic valve stenosis. Pulmonic Valve: The pulmonic valve was normal in structure. Pulmonic valve regurgitation is not visualized. Aorta: The aortic root is normal in size and structure. Venous: The inferior vena cava is dilated in size with greater than 50% respiratory variability, suggesting right atrial pressure of 8 mmHg. IAS/Shunts: No atrial level  shunt detected by color flow Doppler.  LEFT VENTRICLE PLAX 2D LVIDd:         5.00 cm LVIDs:         3.63 cm LV PW:         0.92 cm LV IVS:        0.96 cm LVOT diam:     2.00 cm LV SV:         64 LV SV Index:   36 LVOT Area:     3.14 cm  RIGHT VENTRICLE RV S prime:     6.85 cm/s LEFT ATRIUM             Index       RIGHT ATRIUM           Index LA diam:        4.30 cm 2.44 cm/m  RA Area:     21.70 cm LA Vol (A2C):   98.0 ml 55.55 ml/m RA Volume:   60.50 ml  34.29 ml/m LA Vol (A4C):   63.0 ml 35.71 ml/m LA Biplane Vol: 82.2 ml 46.59 ml/m  AORTIC VALVE LVOT Vmax:   111.93 cm/s LVOT Vmean:  79.000 cm/s LVOT VTI:    0.202 m  AORTA Ao Root diam: 3.10 cm MITRAL VALVE                TRICUSPID VALVE MV Area (PHT): 3.48 cm     TR Peak grad:   23.6 mmHg MV Decel Time: 218 msec     TR Vmax:        243.00 cm/s MV E velocity: 108.00 cm/s MV A velocity: 51.40 cm/s   SHUNTS MV E/A ratio:  2.10         Systemic VTI:  0.20 m                             Systemic Diam: 2.00 cm Loralie Champagne MD Electronically signed by Loralie Champagne MD Signature Date/Time: 06/27/2019/4:31:31 PM    Final    VAS [place] LOWER EXTREMITY VENOUS (DVT)  Result Date: 06/27/2019  Lower Venous DVTStudy Indications: Edema.  Comparison Study: no prior Performing Technologist: [name] RVS  Examination Guidelines: A complete evaluation includes B-mode imaging, spectral Doppler, color Doppler, and power Doppler as needed of all accessible portions of each vessel. Bilateral testing is considered an integral part of a complete examination. Limited examinations for reoccurring indications may be performed as noted. The reflux portion of  the exam is performed with the patient in reverse Trendelenburg.  +---------+---------------+---------+-----------+----------+--------------+ RIGHT    CompressibilityPhasicitySpontaneityPropertiesThrombus Aging +---------+---------------+---------+-----------+----------+--------------+ CFV      Full           Yes       Yes                                 +---------+---------------+---------+-----------+----------+--------------+ SFJ      Full                                                        +---------+---------------+---------+-----------+----------+--------------+ FV Prox  Full                                                        +---------+---------------+---------+-----------+----------+--------------+ FV Mid   Full                                                        +---------+---------------+---------+-----------+----------+--------------+ FV DistalFull                                                        +---------+---------------+---------+-----------+----------+--------------+ PFV      Full                                                        +---------+---------------+---------+-----------+----------+--------------+ POP      Full           Yes      Yes                                 +---------+---------------+---------+-----------+----------+--------------+ PTV      Full                                                        +---------+---------------+---------+-----------+----------+--------------+ PERO                                                  Not visualized +---------+---------------+---------+-----------+----------+--------------+   +---------+---------------+---------+-----------+----------+--------------+ LEFT     CompressibilityPhasicitySpontaneityPropertiesThrombus Aging +---------+---------------+---------+-----------+----------+--------------+ CFV      Full           Yes      Yes                                 +---------+---------------+---------+-----------+----------+--------------+  SFJ      Full                                                        +---------+---------------+---------+-----------+----------+--------------+ FV Prox  Full                                                         +---------+---------------+---------+-----------+----------+--------------+ FV Mid   Full                                                        +---------+---------------+---------+-----------+----------+--------------+ FV DistalFull                                                        +---------+---------------+---------+-----------+----------+--------------+ PFV      Full                                                        +---------+---------------+---------+-----------+----------+--------------+ POP      Full           Yes      Yes                                 +---------+---------------+---------+-----------+----------+--------------+ PTV      Full                                                        +---------+---------------+---------+-----------+----------+--------------+ PERO     Full                                                        +---------+---------------+---------+-----------+----------+--------------+     Summary: BILATERAL: - No evidence of deep vein thrombosis seen in the lower extremities, bilaterally.   *See table(s) above for measurements and observations. Electronically signed by Monica Martinez MD on 06/27/2019 at 8:03:31 PM.    Final    Scheduled Meds: . amLODipine  5 mg Oral QPM  . apixaban  5 mg Oral BID  . carvedilol  3.125 mg Oral BID WC  . doxazosin  2 mg Oral Daily  . doxycycline  100 mg Oral Q12H  . latanoprost  1 drop Both Eyes QHS  . pantoprazole  40 mg Oral q morning - 10a  . polyvinyl alcohol  1 drop Both Eyes QHS  . potassium chloride  20 mEq Oral Daily  . potassium chloride  40 mEq Oral BID  . vitamin B-12  500 mcg Oral q morning - 10a   Continuous Infusions:   LOS: 2 days   Kerney Elbe, DO [facility] PAGER is on [place]  If 7PM-7AM, please contact night-coverage [email or web address]

## 2019-06-28 NOTE — Consult Note (Addendum)
Cardiology Consultation:   Patient ID: Cody Mendoza MRN: [id number]; DOB: October 28, 1921  Admit date: 06/26/2019 Date of Consult: 06/28/2019  Primary Care Provider: Crist Infante, MD Primary Cardiologist: Cristopher Peru, MD  Primary Electrophysiologist:  None    Patient Profile:   Cody Mendoza is a 86 y.o. 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 Alfredia Ferguson.  History of Present Illness:   Cody Mendoza is followed by Dr. Martin[place]. In 08/2018 the patient presented with possible stroke. Imaging was negative for stroke but showed T8-T9 osteomyelitis and discitis. Neurosurgery was consulted but patient declined surgery and was treated with antibiotics. He was seen by cardiology for 39 beats NSVT vs aberrance which was asymptomatic. Echo showed normal EF and observation was recommended. Patient has noted to be ing Afib with fluctuating rates and started on low dose Coreg and Eliquis. Not felt to be a PPM candidate due to him being asymptomatic and his active infection and symptoms were monitored. He was last seen in virtual follow-up 04/18/19 in a wheelchair unable to move. He was still on chronic abx. He was in rate controlled afib. No changes were made.   The patient presented to the ED 06/26/19 for sob, scrotal and lower leg edema. In December the patient moved to a retirement facility (assited living) and since then he has noticed intermittent lower leg edema. He does not add salt to his food but is unsure how much salt the facility puts in the food. Shortness of breath started 2-3 weeks ago and has been gradually worsening. He is bed/wheelchair bound at baseline and cannot ambulate at baseline. He denies chest pain. He is is persistent afib and historically asymptomatic with this.  Prior to living in the facility he was living at his house and moved to Retirement facility due to his leg weakness.    In the ED BP 166/73, pulse 81, afebrile, RR 19. Edema noted on exam. Labs showed WBC 11.2, Hgb 9.0, Glucose 114, albumin 3.3, creatinine 1.19 (baseline around 1.2-1.3), Alk phos 127. BNP 165. HS trop 15. EKG showed afib with heart rate of 79bpm.  CXR showed cardiomegaly with vascular congestion, layering bilateral effusions with bibasilar atelectasis or infiltrates. Patient was given IV lasix 40 mg in the ED. LLE [place] negative for DVT. Patient was admitted for further work-up.     Past Medical History:  Diagnosis Date  . AF (atrial fibrillation) ([place])   . Asthma   . Detached retina   . Discitis of thoracic region 08/2018  . Diverticulosis   . GERD (gastroesophageal reflux disease)   . [name] syndrome   . Hypertension   . Lactose intolerance   . Mitral insufficiency   . Renal calculi   . TIA (transient ischemic attack)     Past Surgical History:  Procedure Laterality Date  . cataract surgery    . ESOPHAGEAL DILATION     In the past  . FINGER TENDON REPAIR    . INGUINAL HERNIA REPAIR    . KIDNEY STONE SURGERY     Status post syrgical removal of a   . TONSILLECTOMY       Home Medications:  Prior to Admission medications   Medication Sig Start Date End Date Taking? Authorizing Provider  amLODipine (NORVASC) 5 MG tablet  Take 1 tablet (5 mg total) by mouth daily. Patient taking differently: Take 5 mg by mouth every evening.  02/21/19 06/26/19 Yes Curatolo, Adam, DO  apixaban (ELIQUIS) 5 MG TABS tablet Take 1 tablet (5 mg total) by mouth 2 (two) times daily. 09/17/18  Yes Martinique, Peter M, MD  carvedilol (COREG) 3.125 MG tablet Take 1 tablet (3.125 mg total) by mouth 2 (two) times daily with a meal. 08/27/18  Yes Hongalgi, Lenis Dickinson, MD  Cyanocobalamin (VITAMIN B12) 500 MCG TABS Take 500 mcg by mouth every morning.    Yes [provider]  doxazosin (CARDURA) 4  MG tablet Take 0.5 tablets (2 mg total) by mouth daily. 08/28/18  Yes Hongalgi, Lenis Dickinson, MD  doxycycline (VIBRA-TABS) 100 MG tablet TAKE 1 TABLET(100 MG) BY MOUTH TWICE DAILY Patient taking differently: Take 100 mg by mouth 2 (two) times daily.  12/24/18  Yes Carlyle Basques, MD  Lactobacillus (PROBIOTIC ACIDOPHILUS PO) Take 1 capsule by mouth daily with supper.    Yes [provider]  latanoprost (XALATAN) 0.005 % ophthalmic solution Place 1 drop into both eyes at bedtime.  09/05/17  Yes [provider]  losartan (COZAAR) 100 MG tablet Take 50 mg by mouth 2 (two) times daily.    Yes [provider]  Multiple Vitamin (MULTIVITAMIN WITH MINERALS) TABS Take 1 tablet by mouth daily with supper.    Yes [provider]  Multiple Vitamins-Minerals (ICAPS AREDS FORMULA PO) Take 1 capsule by mouth 2 (two) times daily with breakfast and lunch.    Yes [provider]  pantoprazole (PROTONIX) 40 MG tablet Take 40 mg by mouth every morning.    Yes [provider]  Polyethyl Glycol-Propyl Glycol (SYSTANE) 0.4-0.3 % SOLN Place 1 drop into both eyes at bedtime.    Yes [provider]    Inpatient Medications: Scheduled Meds: . amLODipine  5 mg Oral QPM  . apixaban  5 mg Oral BID  . carvedilol  3.125 mg Oral BID WC  . doxazosin  2 mg Oral Daily  . doxycycline  100 mg Oral Q12H  . latanoprost  1 drop Both Eyes QHS  . pantoprazole  40 mg Oral q morning - 10a  . potassium chloride  20 mEq Oral Daily  . potassium chloride  40 mEq Oral BID  . vitamin B-12  500 mcg Oral q morning - 10a   Continuous Infusions: . magnesium sulfate bolus IVPB 1 g (06/28/19 0853)   PRN Meds: acetaminophen, ipratropium, levalbuterol, ondansetron **OR** ondansetron (ZOFRAN) IV  Allergies:   No Known Allergies  Social History:   Social History   Socioeconomic History  . Marital status: Married    Spouse name: Not on file  . Number of children: 2  . Years of  education: Not on file  . Highest education level: Not on file  Occupational History  . Occupation: Careers adviser: RETIRED  Tobacco Use  . Smoking status: Never Smoker  . Smokeless tobacco: Never Used  Substance and Sexual Activity  . Alcohol use: Yes    Alcohol/week: 0.0 standard drinks    Comment: rarely  . Drug use: Never  . Sexual activity: Not on file  Other Topics Concern  . Not on file  Social History Narrative  . Not on file   Social Determinants of Health   Financial Resource Strain:   . Difficulty of Paying Living Expenses:   Food Insecurity:   . Worried About [employer] of  Food in the Last Year:   . [place] in the Last Year:   Transportation Needs:   . Film/video editor (Medical):   Marland Kitchen Lack of Transportation (Non-Medical):   Physical Activity:   . Days of Exercise per Week:   . Minutes of Exercise per Session:   Stress:   . Feeling of Stress :   Social Connections:   . Frequency of Communication with Friends and Family:   . Frequency of Social Gatherings with Friends and Family:   . Attends Religious Services:   . Active Member of Clubs or Organizations:   . Attends Archivist Meetings:   Marland Kitchen Marital Status:   Intimate Partner Violence:   . Fear of Current or Ex-Partner:   . Emotionally Abused:   Marland Kitchen Physically Abused:   . Sexually Abused:     Family History:   Family History  Problem Relation Age of Onset  . Hypertension Mother      ROS:  Please see the history of present illness.  All other ROS reviewed and negative.     Physical Exam/Data:   Vitals:   06/28/19 0150 06/28/19 0506 06/28/19 0506 06/28/19 0858  BP: 137/74  123/61 138/72  Pulse: 88  83 83  Resp: 20  (!) 21 18  Temp: 97.8 F (36.6 C)  98.5 F (36.9 C) 99.3 F (37.4 C)  TempSrc: Oral  Oral Oral  SpO2: 94%  95% 93%  Weight:  74.5 kg    Height:        Intake/Output Summary (Last 24 hours) at 06/28/2019 0923 Last data filed at  06/28/2019 0700 Gross per 24 hour  Intake 480 ml  Output 3500 ml  Net -3020 ml   Last 3 Weights 06/28/2019 06/27/2019 02/21/2019  Weight (lbs) 164 lb 4.8 oz 169 lb 15.6 oz 145 lb 1 oz  Weight (kg) 74.526 kg 77.1 kg 65.8 kg     Body mass index is 25.73 kg/m.  General:  Frail WM in no acute distress HEENT: normal Lymph: no adenopathy Neck: mild JVD Endocrine:  No thryomegaly Vascular: No carotid bruits; FA pulses 2+ bilaterally without bruits  Cardiac:  normal S1, S2; Irreg Irreg; no murmur  Lungs:  clear to auscultation bilaterally, no wheezing, rhonchi or rales  Abd: soft, nontender, no hepatomegaly  Ext: 1-2+ pedal edema Musculoskeletal:  No deformities, BUE and BLE strength normal and equal Skin: warm and dry  Neuro:  CNs 2-12 intact, no focal abnormalities noted Psych:  Normal affect   EKG:  The EKG was personally reviewed and demonstrates:  Afib, 79 bpm, PVC, no ST/T wave changes Telemetry:  Telemetry was personally reviewed and demonstrates:  Afib, HR 80-90s, PVCs  Relevant CV Studies:  Echo 06/27/19 1. Left ventricular ejection fraction, by estimation, is 55 to 60%. The  left ventricle has normal function. The left ventricle has no regional  wall motion abnormalities. Left ventricular diastolic parameters are  indeterminate.  2. Right ventricular systolic function is mildly reduced. The right  ventricular size is normal. There is mildly elevated pulmonary artery  systolic pressure. The estimated right ventricular systolic pressure is  80.9 mmHg.  3. Left atrial size was moderately dilated.  4. Right atrial size was mildly dilated.  5. The mitral valve is normal in structure. Trivial mitral valve  regurgitation. No evidence of mitral stenosis.  6. The aortic valve is tricuspid. Aortic valve regurgitation is not  visualized. Mild aortic valve sclerosis is present, with no  evidence of  aortic valve stenosis.  7. The inferior vena cava is dilated in size with >50%  respiratory  variability, suggesting right atrial pressure of 8 mmHg.  8. The patient is in atrial fibrillation.  Laboratory Data:  High Sensitivity Troponin:  No results for input(s): TROPONINIHS in the last 720 hours.   Chemistry Recent Labs  Lab 06/26/19 2203 06/27/19 0542 06/28/19 0417  NA 140 138 138  K 4.0 4.8 3.4*  CL 108 105 97*  CO2 22 25 28   GLUCOSE 114* 87 104*  BUN 36* 35* 38*  CREATININE 1.19 1.14 1.62*  CALCIUM 8.8* 8.7* 8.2*  GFRNONAA 52* 54* 36*  GFRAA 60* >60 41*  ANIONGAP 10 8 13     Recent Labs  Lab 06/26/19 2203 06/28/19 0417  PROT 6.8 6.1*  ALBUMIN 3.3* 2.9*  AST 26 23  ALT 23 17  ALKPHOS 127* 99  BILITOT 0.8 1.3*   Hematology Recent Labs  Lab 06/26/19 2203 06/26/19 2203 06/27/19 0542 06/28/19 0417  WBC 11.2*  --  10.2 12.1*  RBC 2.96*   < > 2.97* 2.76*  2.70*  HGB 9.0*  --  9.0* 8.3*  HCT 28.5*  --  28.4* 26.4*  MCV 96.3  --  95.6 95.7  MCH 30.4  --  30.3 30.1  MCHC 31.6  --  31.7 31.4  RDW 14.4  --  14.4 14.5  PLT 259  --  258 235   < > = values in this interval not displayed.   BNP Recent Labs  Lab 06/26/19 2203  BNP 165.6*    DDimer No results for input(s): DDIMER in the last 168 hours.   Radiology/Studies:  DG CHEST PORT 1 VIEW  Result Date: 06/28/2019 CLINICAL DATA:  86 year old male with shortness of breath. Lower extremity edema. EXAM: PORTABLE CHEST 1 VIEW COMPARISON:  Portable chest 06/26/2019 and earlier. FINDINGS: Portable AP semi upright view at 0558 hours. Stable cardiomegaly and mediastinal contours. Stable lung volumes. Bilateral pulmonary interstitial opacity and mild bibasilar veiling opacity is stable. No superimposed pneumothorax. No consolidation identified. Paucity of bowel gas in the upper abdomen. Visualized tracheal air column is within normal limits. No acute osseous abnormality identified. IMPRESSION: Cardiomegaly with suspected pulmonary interstitial edema and small pleural effusions. Stable  ventilation since 06/26/2019. Electronically Signed   By: Genevie Ann M.D.   On: 06/28/2019 07:14   DG Chest Port 1 View  Result Date: 06/26/2019 CLINICAL DATA:  Shortness of breath, wheezing EXAM: PORTABLE CHEST 1 VIEW COMPARISON:  02/21/2019 FINDINGS: Cardiomegaly, vascular congestion. Probable layering bilateral effusions with bibasilar atelectasis or infiltrates. No overt edema. No acute bony abnormality. Aortic atherosclerosis. IMPRESSION: Cardiomegaly with vascular congestion. Layering bilateral effusions with bibasilar atelectasis or infiltrates. Electronically Signed   By: Rolm Baptise M.D.   On: 06/26/2019 21:46   ECHOCARDIOGRAM COMPLETE  Result Date: 06/27/2019    ECHOCARDIOGRAM REPORT   Patient Name:   KHALEED HOLAN Date of Exam: 06/27/2019 Medical Rec #:  [id number]          Height:       67.0 in Accession #:    [id number]         Weight:       145.1 lb Date of Birth:  Jul 31, 1921          BSA:          1.764 m Patient Age:    89 years           BP:  122/97 mmHg Patient Gender: M                  HR:           75 bpm. Exam Location:  Inpatient Procedure: 2D Echo Indications:    Congestive Heart Failure 428.0 / I50.9  History:        Patient has prior history of Echocardiogram examinations, most                 recent 08/26/2018. CHF, TIA; Risk Factors:Hypertension. CKD.  Sonographer:    Vikki Ports Turrentine Referring Phys: [place]  1. Left ventricular ejection fraction, by estimation, is 55 to 60%. The left ventricle has normal function. The left ventricle has no regional wall motion abnormalities. Left ventricular diastolic parameters are indeterminate.  2. Right ventricular systolic function is mildly reduced. The right ventricular size is normal. There is mildly elevated pulmonary artery systolic pressure. The estimated right ventricular systolic pressure is 12.4 mmHg.  3. Left atrial size was moderately dilated.  4. Right atrial size was mildly dilated.  5.  The mitral valve is normal in structure. Trivial mitral valve regurgitation. No evidence of mitral stenosis.  6. The aortic valve is tricuspid. Aortic valve regurgitation is not visualized. Mild aortic valve sclerosis is present, with no evidence of aortic valve stenosis.  7. The inferior vena cava is dilated in size with >50% respiratory variability, suggesting right atrial pressure of 8 mmHg.  8. The patient is in atrial fibrillation. FINDINGS  Left Ventricle: Left ventricular ejection fraction, by estimation, is 55 to 60%. The left ventricle has normal function. The left ventricle has no regional wall motion abnormalities. The left ventricular internal cavity size was normal in size. There is  no left ventricular hypertrophy. Left ventricular diastolic parameters are indeterminate. Right Ventricle: The right ventricular size is normal. No increase in right ventricular wall thickness. Right ventricular systolic function is mildly reduced. There is mildly elevated pulmonary artery systolic pressure. The tricuspid regurgitant velocity  is 2.43 m/s, and with an assumed right atrial pressure of 8 mmHg, the estimated right ventricular systolic pressure is 58.0 mmHg. Left Atrium: Left atrial size was moderately dilated. Right Atrium: Right atrial size was mildly dilated. Pericardium: Trivial pericardial effusion is present. Mitral Valve: The mitral valve is normal in structure. Trivial mitral valve regurgitation. No evidence of mitral valve stenosis. Tricuspid Valve: The tricuspid valve is normal in structure. Tricuspid valve regurgitation is trivial. Aortic Valve: The aortic valve is tricuspid. Aortic valve regurgitation is not visualized. Mild aortic valve sclerosis is present, with no evidence of aortic valve stenosis. Pulmonic Valve: The pulmonic valve was normal in structure. Pulmonic valve regurgitation is not visualized. Aorta: The aortic root is normal in size and structure. Venous: The inferior vena cava is  dilated in size with greater than 50% respiratory variability, suggesting right atrial pressure of 8 mmHg. IAS/Shunts: No atrial level shunt detected by color flow Doppler.  LEFT VENTRICLE PLAX 2D LVIDd:         5.00 cm LVIDs:         3.63 cm LV PW:         0.92 cm LV IVS:        0.96 cm LVOT diam:     2.00 cm LV SV:         64 LV SV Index:   36 LVOT Area:     3.14 cm  RIGHT VENTRICLE RV S prime:  6.85 cm/s LEFT ATRIUM             Index       RIGHT ATRIUM           Index LA diam:        4.30 cm 2.44 cm/m  RA Area:     21.70 cm LA Vol (A2C):   98.0 ml 55.55 ml/m RA Volume:   60.50 ml  34.29 ml/m LA Vol (A4C):   63.0 ml 35.71 ml/m LA Biplane Vol: 82.2 ml 46.59 ml/m  AORTIC VALVE LVOT Vmax:   111.93 cm/s LVOT Vmean:  79.000 cm/s LVOT VTI:    0.202 m  AORTA Ao Root diam: 3.10 cm MITRAL VALVE                TRICUSPID VALVE MV Area (PHT): 3.48 cm     TR Peak grad:   23.6 mmHg MV Decel Time: 218 msec     TR Vmax:        243.00 cm/s MV E velocity: 108.00 cm/s MV A velocity: 51.40 cm/s   SHUNTS MV E/A ratio:  2.10         Systemic VTI:  0.20 m                             Systemic Diam: 2.00 cm Loralie Champagne MD Electronically signed by Loralie Champagne MD Signature Date/Time: 06/27/2019/4:31:31 PM    Final    VAS [place] LOWER EXTREMITY VENOUS (DVT)  Result Date: 06/27/2019  Lower Venous DVTStudy Indications: Edema.  Comparison Study: no prior Performing Technologist: [name] RVS  Examination Guidelines: A complete evaluation includes B-mode imaging, spectral Doppler, color Doppler, and power Doppler as needed of all accessible portions of each vessel. Bilateral testing is considered an integral part of a complete examination. Limited examinations for reoccurring indications may be performed as noted. The reflux portion of the exam is performed with the patient in reverse Trendelenburg.  +---------+---------------+---------+-----------+----------+--------------+ RIGHT     CompressibilityPhasicitySpontaneityPropertiesThrombus Aging +---------+---------------+---------+-----------+----------+--------------+ CFV      Full           Yes      Yes                                 +---------+---------------+---------+-----------+----------+--------------+ SFJ      Full                                                        +---------+---------------+---------+-----------+----------+--------------+ FV Prox  Full                                                        +---------+---------------+---------+-----------+----------+--------------+ FV Mid   Full                                                        +---------+---------------+---------+-----------+----------+--------------+ FV DistalFull                                                        +---------+---------------+---------+-----------+----------+--------------+  PFV      Full                                                        +---------+---------------+---------+-----------+----------+--------------+ POP      Full           Yes      Yes                                 +---------+---------------+---------+-----------+----------+--------------+ PTV      Full                                                        +---------+---------------+---------+-----------+----------+--------------+ PERO                                                  Not visualized +---------+---------------+---------+-----------+----------+--------------+   +---------+---------------+---------+-----------+----------+--------------+ LEFT     CompressibilityPhasicitySpontaneityPropertiesThrombus Aging +---------+---------------+---------+-----------+----------+--------------+ CFV      Full           Yes      Yes                                 +---------+---------------+---------+-----------+----------+--------------+ SFJ      Full                                                         +---------+---------------+---------+-----------+----------+--------------+ FV Prox  Full                                                        +---------+---------------+---------+-----------+----------+--------------+ FV Mid   Full                                                        +---------+---------------+---------+-----------+----------+--------------+ FV DistalFull                                                        +---------+---------------+---------+-----------+----------+--------------+ PFV      Full                                                        +---------+---------------+---------+-----------+----------+--------------+  POP      Full           Yes      Yes                                 +---------+---------------+---------+-----------+----------+--------------+ PTV      Full                                                        +---------+---------------+---------+-----------+----------+--------------+ PERO     Full                                                        +---------+---------------+---------+-----------+----------+--------------+     Summary: BILATERAL: - No evidence of deep vein thrombosis seen in the lower extremities, bilaterally.   *See table(s) above for measurements and observations. Electronically signed by Monica Martinez MD on 06/27/2019 at 8:03:31 PM.    Final    {    Assessment and Plan:   Acute on chronic diastolic HF/Right-sided HF Presented with LLE and scrotal swelling. EKG showed rate controlled afib. BNP 1645 and alk phos 127. Creatinine 1.19. Hs trop 15. CXR with vascular congestion, layering b/l effusions with possible infiltrate. Given IV lasix 40 mg in the ED. LLE [place] negative for DVT. COVID negative.  - Started on IV lasix 40 mg BID - Patient has put out 3.5L overnight and  - weight down 5 lbs - Albumin also low at 2.9 - creatinine 1.19 on admission which is around  baseline. Today creatinine 1.62 - Echo this admission showed EF 55-60%, no WMA, mildly elevated pulmonary artery systolic pressure, mildly reduced RV function with systolic pressure of 05.3ZJQB, moderately dilated LA, mildly dilated RA, trivial MR, RAP 33mHg - Lasix and Arb held for rising creatinine. CXR today shows suspected pulmonary interstitial edema and small pleural effusions. Still has lower leg swelling on exam. Lungs clear - Suspect that since patient moved to the facility in December he has been getting more salt in his diet hence lower leg edema since that time. Likely needs Lasix as outpatient for volume management. He still has some edema and needs diuresis but can hold today for increasing creatinine. Reevaluate kidney function tomorrow for IV lasix. MD to see.   Persistent Afib - rate controlled on Coreg - continue Eliquis  AKI/CKD stage 3 - creatinine baseline around 1.2-1.3 - On admission 1.19 - Today 1.62 - Hold ARB and lasix today - BMET tomorrow  Leukocytosis - up today at 12.1 - monitor CBC - CXr today with no infectious process  HTN - at baseline is on amlodipine 5 mg daily, Coreg 3.125 mg twice daily, losartan 100 mg daily.  - ARB held for AKI - pressures stable  Hypomagnesemia/hypokalemia - receiving supplementation - follow BMET  Chronic anemia - Hgb 9 on admission, around baseline  T8-T9 discitis - chronic abx followed by Dr. SGraylon Good Spinal stenosis/bedbound - followed by Dr. NKathyrn Sheriff neurosurgeon - PT/OT   For questions or updates, please contact [facility] Please consult [email or web address] for contact info under     Signed, Cadence H  Jorene Minors  06/28/2019 9:23 AM   Patient seen and examined   I agree with findings as noted by Read Drivers above.  Pt is a 86 yo with persistent atrial fibrillation admitted yesterday with SOB and edema   I have reviewed echo images from yesterday  LVEF is normal   I think RVEF is probably normal as well    Potential cause for overload is excess salt in patients diet at facility  On exam, Pt is in NAD JVP is still increased Lungs with decreased BS at bases Cardiac exam:   Irreg irreg   No S3   Abd  Nontender Ext with 1+ edema  Would continue diuresis but pause today with bump in Cr    FOllow renal function in am  Continue rate control and anticoagulation  Follow BP as diurese Instructed pt to discuss with facility salt use in meals  Dorris Carnes MD

## 2019-06-28 NOTE — Care Management Important Message (Signed)
Important Message  Patient Details IM Letter given to [name] RN Case Manager to present to the Patient Name: Cody Mendoza MRN: [id number] Date of Birth: 1921/10/07   Medicare Important Message Given:  Yes     [name] 06/28/2019, 11:57 AM

## 2019-06-29 ENCOUNTER — Inpatient Hospital Stay (HOSPITAL_COMMUNITY): Payer: Medicare Other

## 2019-06-29 DIAGNOSIS — I5031 Acute diastolic (congestive) heart failure: Secondary | ICD-10-CM

## 2019-06-29 LAB — COMPREHENSIVE METABOLIC PANEL
ALT: 16 U/L (ref 0–44)
AST: 21 U/L (ref 15–41)
Albumin: 2.9 g/dL — ABNORMAL LOW (ref 3.5–5.0)
Alkaline Phosphatase: 98 U/L (ref 38–126)
Anion gap: 11 (ref 5–15)
BUN: 51 mg/dL — ABNORMAL HIGH (ref 8–23)
CO2: 29 mmol/L (ref 22–32)
Calcium: 8.4 mg/dL — ABNORMAL LOW (ref 8.9–10.3)
Chloride: 98 mmol/L (ref 98–111)
Creatinine, Ser: 1.78 mg/dL — ABNORMAL HIGH (ref 0.61–1.24)
GFR calc Af Amer: 37 mL/min — ABNORMAL LOW (ref >60)
GFR calc non Af Amer: 32 mL/min — ABNORMAL LOW (ref >60)
Glucose, Bld: 116 mg/dL — ABNORMAL HIGH (ref 70–99)
Potassium: 4 mmol/L (ref 3.5–5.1)
Sodium: 138 mmol/L (ref 135–145)
Total Bilirubin: 1.4 mg/dL — ABNORMAL HIGH (ref 0.3–1.2)
Total Protein: 6.2 g/dL — ABNORMAL LOW (ref 6.5–8.1)

## 2019-06-29 LAB — CBC WITH DIFFERENTIAL/PLATELET
Abs Immature Granulocytes: 0.06 10*3/uL (ref 0.00–0.07)
Basophils Absolute: 0.1 10*3/uL (ref 0.0–0.1)
Basophils Relative: 1 %
Eosinophils Absolute: 0.5 10*3/uL (ref 0.0–0.5)
Eosinophils Relative: 5 %
HCT: 26.7 % — ABNORMAL LOW (ref 39.0–52.0)
Hemoglobin: 8.5 g/dL — ABNORMAL LOW (ref 13.0–17.0)
Immature Granulocytes: 1 %
Lymphocytes Relative: 13 %
Lymphs Abs: 1.4 10*3/uL (ref 0.7–4.0)
MCH: 30.2 pg (ref 26.0–34.0)
MCHC: 31.8 g/dL (ref 30.0–36.0)
MCV: 95 fL (ref 80.0–100.0)
Monocytes Absolute: 1.9 10*3/uL — ABNORMAL HIGH (ref 0.1–1.0)
Monocytes Relative: 17 %
Neutro Abs: 7.1 10*3/uL (ref 1.7–7.7)
Neutrophils Relative %: 63 %
Platelets: 237 10*3/uL (ref 150–400)
RBC: 2.81 MIL/uL — ABNORMAL LOW (ref 4.22–5.81)
RDW: 14.2 % (ref 11.5–15.5)
WBC: 11 10*3/uL — ABNORMAL HIGH (ref 4.0–10.5)
nRBC: 0 % (ref 0.0–0.2)

## 2019-06-29 LAB — PHOSPHORUS: Phosphorus: 3.8 mg/dL (ref 2.5–4.6)

## 2019-06-29 LAB — MAGNESIUM: Magnesium: 1.9 mg/dL (ref 1.7–2.4)

## 2019-06-29 MED ORDER — LEVALBUTEROL HCL 0.63 MG/3ML IN NEBU
0.6300 mg | INHALATION_SOLUTION | Freq: Three times a day (TID) | RESPIRATORY_TRACT | Status: DC
  Administered 2019-06-29 – 2019-07-07 (×24): 0.63 mg via RESPIRATORY_TRACT
  Filled 2019-06-29 (×24): qty 3

## 2019-06-29 MED ORDER — IPRATROPIUM BROMIDE 0.02 % IN SOLN
0.5000 mg | Freq: Four times a day (QID) | RESPIRATORY_TRACT | Status: DC
  Administered 2019-06-29: 0.5 mg via RESPIRATORY_TRACT
  Filled 2019-06-29: qty 2.5

## 2019-06-29 MED ORDER — LEVALBUTEROL HCL 0.63 MG/3ML IN NEBU
0.6300 mg | INHALATION_SOLUTION | Freq: Four times a day (QID) | RESPIRATORY_TRACT | Status: DC
  Administered 2019-06-29: 0.63 mg via RESPIRATORY_TRACT
  Filled 2019-06-29: qty 3

## 2019-06-29 MED ORDER — GUAIFENESIN ER 600 MG PO TB12
1200.0000 mg | ORAL_TABLET | Freq: Two times a day (BID) | ORAL | Status: DC
  Administered 2019-06-29 – 2019-07-07 (×16): 1200 mg via ORAL
  Filled 2019-06-29 (×19): qty 2

## 2019-06-29 MED ORDER — DICLOFENAC SODIUM 1 % EX GEL
2.0000 g | Freq: Four times a day (QID) | CUTANEOUS | Status: DC
  Administered 2019-06-29 – 2019-07-07 (×28): 2 g via TOPICAL
  Filled 2019-06-29: qty 100

## 2019-06-29 MED ORDER — APIXABAN 2.5 MG PO TABS
2.5000 mg | ORAL_TABLET | Freq: Two times a day (BID) | ORAL | Status: DC
  Administered 2019-06-29 – 2019-07-07 (×15): 2.5 mg via ORAL
  Filled 2019-06-29 (×17): qty 1

## 2019-06-29 MED ORDER — IPRATROPIUM BROMIDE 0.02 % IN SOLN
0.5000 mg | Freq: Three times a day (TID) | RESPIRATORY_TRACT | Status: DC
  Administered 2019-06-29 – 2019-07-07 (×24): 0.5 mg via RESPIRATORY_TRACT
  Filled 2019-06-29 (×24): qty 2.5

## 2019-06-29 MED ORDER — FUROSEMIDE 10 MG/ML IJ SOLN
40.0000 mg | Freq: Once | INTRAMUSCULAR | Status: AC
  Administered 2019-06-29: 40 mg via INTRAVENOUS
  Filled 2019-06-29: qty 4

## 2019-06-29 MED ORDER — METOPROLOL SUCCINATE ER 25 MG PO TB24
25.0000 mg | ORAL_TABLET | Freq: Every day | ORAL | Status: DC
  Administered 2019-06-29 – 2019-07-07 (×8): 25 mg via ORAL
  Filled 2019-06-29 (×9): qty 1

## 2019-06-29 NOTE — Plan of Care (Signed)
  Problem: Education: Goal: Knowledge of General Education information will improve Description Including pain rating scale, medication(s)/side effects and non-pharmacologic comfort measures Outcome: Progressing   

## 2019-06-29 NOTE — Progress Notes (Signed)
PROGRESS NOTE    Cody Mendoza  [id number] DOB: [birth year] DOA: 06/26/2019 PCP: Crist Infante, MD   Brief Narrative:  The patient is a elderly generally bedbound 86 year old Caucasian male with a past medical history significant for but not limited to atrial fibrillation, hypertension, mitral valve insufficiency, GERD, history of [name] syndrome, history of TIA.  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  He is admitted for acute diastolic CHF and was placed on IV Lasix 40 mg every 12.  Lower extremity duplex was done and showed DVT in the lower extremities bilaterally.  On admission his BNP was elevated to 165.6.  ECHOCARDIOGRAM is to be repeated and showed an EF of 55 to 60% with no wall motion abnormalities and mildly reduced right ventricular systolic function.  Left ventricular diastolic parameters were indeterminate.  Subsequently the patient had an AKI after diuresis and his ARB so this was been held.  Cardiology is consulted for further evaluation recommendations and appreciate their assistance in management.  Cardiology recommends holding Lasix for today and reevaluate you the patient still has some pulmonary interstitial edema noted as well as some Lower extremity swelling  Assessment & Plan:   Principal Problem:   Acute CHF (congestive heart failure) (HCC) Active Problems:   Hypertension   Atrial fibrillation (HCC)   Discitis of thoracic region   CKD (chronic kidney disease) stage 2, GFR 60-89 ml/min   TIA (transient ischemic attack)  Acute on chronic diastolic CHF and right-sided heart failure -last EF measured in 2020 was 60 to 65%.  -BNP on Admission was 165.6 and presented with lower extremity swelling as well as scrotal swelling -Initially was placed Lasix 40 mg IV every 12h after  being given 1 dose of 40 mg of Lasix in the ED but have held it given AKI; Now Lasix is going to be resumed and he was given 40 mg of IV Lasix again -closely follow intake output metabolic panel -Patient is -4.690 Liters since Admission and weight is down 6 pounds -C/w Carvedilol 3.125 mg po BID; Losartan 50 mg po BID now held due AKI and will consider resuming in the AM  -CXR this AM is showing "Lung volumes are low. Small left pleural effusion. Bibasilar opacities favored to reflect areas of subsegmental atelectasis. No definite consolidative airspace disease. Cephalization of the pulmonary vasculature with some indistinct interstitial markings, indicative of mild interstitial pulmonary edema. Moderate cardiomegaly. Upper mediastinal contours are distorted by patient's rotation to the right. Aortic atherosclerosis." -Check 2D echo showed an EF of 55 to 60% with indeterminate left ventricular diastolic parameters and mildly reduced right-sided systolic dysfunction -Given that patient is bedbound will check Dopplers of the lower extremity to rule out DVT and this was Negative  -Cardiology recommended holding Lasix yesterday and reevaluate and they are going to give IV Lasix today; they suspect the patient has been getting more salt in his diet since moving to this facility.  They also feel that he needs Lasix in outpatient setting -Continue to monitor signs and symptoms of volume overload and repeat chest x-ray in the a.m.  Dyspena/Wheezing -from Above -Never Smoked -continue with Diuresis -Scheduled Xopenex/Atrovent -Repeat CXR in the AM -Add Flutter Valve, Incentive Spirometer, and Guaifenesin  Chronic A. Fib  -Patient is on Carvedilol 3.125 mg po BID and cardiology is  changing this to Toprol for more beta-2 selectivity -C/w Apixaban 5 mg po BID -Continue to monitor on telemetry carefully.  Hypertension  -C/w Carvedilol 3.125 mg po BID, Losartan 50 mg po BID and Amlodipine 5 mg po Daily     T8-T9 Discitis  -for which patient on chronic doxycycline therapy being followed by Dr. Graylon Good infectious disease.  Spinal Stenosis  -Bedbound being followed by neurosurgeon Dr. Kathyrn Sheriff. -Get PT/OT to further evaluate and treat  Chronic Normocytic Anemia -Paitient's Hgb/Hct went from 9.0/28.5 -> 9.0/28.4 -> 8.3/26.4 -> 8.5/26.7 -On B12 supplements with cyanocobalamin 500 mcg every morning orally -Check Anemia Panel; it showed an iron level of 22, U IBC of 171, TIBC 193, saturation ratios of 11%, ferritin level of 281, folate level of 29.4, and vitamin B12 898 -Continue to Monitor for S/Sx of Bleeding; Currently no overt bleeding noted -Repeat CBC in AM   Leukocytosis  -Mild and Likely Reactive -Currently Afebrile with a TMax of 99.3 in the last 24 hours -Already on antibiotics with chronic doxycycline for his T8/T9 infection and discitis -Continue to Monitor for S/Sx of Infection -Repeat CBC in AM   Hypokalemia -In the Setting of Diuresis  -K+ was 3.4 yesterday and improved to 4.0 -Continue to Monitor and Replete as Necessary -Repeat CMP in AM   Hypomagnesemia -Patient magnesium level this morning was 1.3 and improved to 1.9 -Replete with IV mag sulfate 3 g yesterday -Continue to monitor replete as necessary -Repeat magnesium level in a.m.  AKI on CKD Stage 3 -Patient's BUNs/creatinine from 35/1.14 and worsened to 51/1.78 today in the setting of diuresis and his ARB -Holding losartan 50 mg p.o. twice daily but lasix has has been resumed  -Avoid nephrotoxic medications, contrast dyes, hypotension and renally dose medications -Continue to monitor and trend renal function carefully -Repeat CMP in the a.m.  Hyperbilirubinemia -Mild with a total bilirubin of 1.3 yesterday and 1.4 today  -Continue monitor and trend and repeat CMP in a.m.  Diarrhea -Mild; patient is on some Doxycycline -Continue to monitor carefully as he is afebrile and does have a slight  leukocytosis -Not on any stool softeners or any laxatives -If continues to worsen will obtain a GI pathogen panel and possibly C. difficile PCR given that has been on chronic doxycycline  Right shoulder pain -We will start Voltaren gel -Continue to monitor and if necessary will obtain x-ray  DVT prophylaxis: Anticoagulated with Apixaban Code Status: DO NOT RESUSCITATE  Family Communication: Discussed with Son at Bedside  Disposition Plan:   Status is: Inpatient  Remains inpatient appropriate because:IV treatments appropriate due to intensity of illness or inability to take PO and Inpatient level of care appropriate due to severity of illness   Dispo: The patient is from: ALF              Anticipated d/c is to: ALF              Anticipated d/c date is: 2 days              Patient currently is not medically stable to d/c.  Consultants:   Cardiology    Procedures:  ECHOCARDIOGRAM IMPRESSIONS    1. Left ventricular ejection fraction, by estimation, is 55 to 60%. The  left ventricle has normal function. The left ventricle has no regional  wall motion abnormalities. Left ventricular diastolic parameters are  indeterminate.  2. Right ventricular systolic function is mildly reduced. The right  ventricular size is normal. There is mildly  elevated pulmonary artery  systolic pressure. The estimated right ventricular systolic pressure is  [id number] mmHg.  3. Left atrial size was moderately dilated.  4. Right atrial size was mildly dilated.  5. The mitral valve is normal in structure. Trivial mitral valve  regurgitation. No evidence of mitral stenosis.  6. The aortic valve is tricuspid. Aortic valve regurgitation is not  visualized. Mild aortic valve sclerosis is present, with no evidence of  aortic valve stenosis.  7. The inferior vena cava is dilated in size with >50% respiratory  variability, suggesting right atrial pressure of 8 mmHg.  8. The patient is in atrial  fibrillation.   FINDINGS  Left Ventricle: Left ventricular ejection fraction, by estimation, is 55  to 60%. The left ventricle has normal function. The left ventricle has no  regional wall motion abnormalities. The left ventricular internal cavity  size was normal in size. There is  no left ventricular hypertrophy. Left ventricular diastolic parameters  are indeterminate.   Right Ventricle: The right ventricular size is normal. No increase in  right ventricular wall thickness. Right ventricular systolic function is  mildly reduced. There is mildly elevated pulmonary artery systolic  pressure. The tricuspid regurgitant velocity  is 2.43 m/s, and with an assumed right atrial pressure of 8 mmHg, the  estimated right ventricular systolic pressure is [id number] mmHg.   Left Atrium: Left atrial size was moderately dilated.   Right Atrium: Right atrial size was mildly dilated.   Pericardium: Trivial pericardial effusion is present.   Mitral Valve: The mitral valve is normal in structure. Trivial mitral  valve regurgitation. No evidence of mitral valve stenosis.   Tricuspid Valve: The tricuspid valve is normal in structure. Tricuspid  valve regurgitation is trivial.   Aortic Valve: The aortic valve is tricuspid. Aortic valve regurgitation is  not visualized. Mild aortic valve sclerosis is present, with no evidence  of aortic valve stenosis.   Pulmonic Valve: The pulmonic valve was normal in structure. Pulmonic valve  regurgitation is not visualized.   Aorta: The aortic root is normal in size and structure.   Venous: The inferior vena cava is dilated in size with greater than 50%  respiratory variability, suggesting right atrial pressure of 8 mmHg.   IAS/Shunts: No atrial level shunt detected by color flow Doppler.     LEFT VENTRICLE  PLAX 2D  LVIDd:     5.00 cm  LVIDs:     3.63 cm  LV PW:     0.92 cm  LV IVS:    0.96 cm  LVOT diam:   2.00 cm  LV SV:      64  LV SV Index:  36  LVOT Area:   3.14 cm     RIGHT VENTRICLE  RV S prime:   6.85 cm/s   LEFT ATRIUM       Index    RIGHT ATRIUM      Index  LA diam:    4.30 cm 2.44 cm/m RA Area:   21.70 cm  LA Vol (A2C):  98.0 ml 55.55 ml/m RA Volume:  60.50 ml 34.29 ml/m  LA Vol (A4C):  63.0 ml 35.71 ml/m  LA Biplane Vol: 82.2 ml 46.59 ml/m  AORTIC VALVE  LVOT Vmax:  111.93 cm/s  LVOT Vmean: 79.000 cm/s  LVOT VTI:  0.202 m    AORTA  Ao Root diam: 3.10 cm   MITRAL VALVE        TRICUSPID VALVE  MV Area (PHT): 3.48 cm  TR Peak grad:  23.6 mmHg  MV Decel Time: 218 msec   TR Vmax:    243.00 cm/s  MV E velocity: 108.00 cm/s  MV A velocity: 51.40 cm/s  SHUNTS  MV E/A ratio: 2.10     Systemic VTI: 0.20 m               Systemic Diam: 2.00 cm   Antimicrobials:  Anti-infectives (From admission, onward)   Start     Dose/Rate Route Frequency Ordered Stop   06/27/19 1000  doxycycline (VIBRA-TABS) tablet 100 mg     100 mg Oral Every 12 hours 06/27/19 0400       Subjective: Seen and examined at bedside and states that he is having some diarrhea now.  Thinks that his breathing is about the same and is continuing to wheeze.  Also complained of some shoulder pain.  No nausea or vomiting.  Denies any other concerns or complaints at this time but states that he is trying to avoid fruits because of the diarrhea.  Objective: Vitals:   06/29/19 0637 06/29/19 1224 06/29/19 1422 06/29/19 1432  BP: 140/65 140/64    Pulse: 92 80    Resp:  (!) 22    Temp: 98.3 F (36.8 C) 97.7 F (36.5 C) 97.7 F (36.5 C)   TempSrc: Oral Oral Oral   SpO2: 95% 96%  94%  Weight: 74.1 kg     Height:        Intake/Output Summary (Last 24 hours) at 06/29/2019 1532 Last data filed at 06/29/2019 1420 Gross per 24 hour  Intake 480 ml  Output 1600 ml  Net -1120 ml   Filed Weights   06/27/19 1358 06/28/19 0506 06/29/19 0637  Weight: 77.1  kg 74.5 kg 74.1 kg   Examination: Physical Exam:  Constitutional: WN/WD elderly overweight Caucasian male currently in no acute distress but is wheezing and does seem a little uncomfortable Eyes: Lids and conjunctivae normal, sclerae anicteric  ENMT: External Ears, Nose appear normal. Grossly normal hearing. Neck: Appears normal, supple, no cervical masses, normal ROM, no appreciable thyromegaly; no JVD Respiratory: Diminished to auscultation bilaterally with coarse breath sounds and some wheezing and some slight rhonchi.  He does also continue to have some crackles.  Normal respiratory effort and unlabored breathing and not wearing any supplemental oxygen via nasal cannula.  Cardiovascular: Irregularly irregular, no murmurs / rubs / gallops. S1 and S2 auscultated.  1+ lower extremity edema  Abdomen: Soft, non-tender, non-distended. No masses palpated. No appreciable hepatosplenomegaly. Bowel sounds positive.  GU: Deferred. Musculoskeletal: No clubbing / cyanosis of digits/nails. No joint deformity upper and lower extremities.  Skin: No rashes, lesions, ulcers on limited skin evaluation. No induration; Warm and dry.  Neurologic: CN 2-12 grossly intact with no focal deficits. Romberg sign and cerebellar reflexes not assessed.  Psychiatric: Normal judgment and insight. Alert and oriented x 3. Normal mood and appropriate affect.   Data Reviewed: I have personally reviewed following labs and imaging studies  CBC: Recent Labs  Lab 06/26/19 2203 06/27/19 0542 06/28/19 0417 06/29/19 0433  WBC 11.2* 10.2 12.1* 11.0*  NEUTROABS 7.5  --  9.0* 7.1  HGB 9.0* 9.0* 8.3* 8.5*  HCT 28.5* 28.4* 26.4* 26.7*  MCV 96.3 95.6 95.7 95.0  PLT 259 258 235 [id number]   Basic Metabolic Panel: Recent Labs  Lab 06/26/19 2203 06/27/19 0542 06/28/19 0417 06/29/19 0433  NA 140 138 138 138  K 4.0 4.8 3.4* 4.0  CL 108 105 97* 98  CO2 22 25 28 29   GLUCOSE 114* 87 104* 116*  BUN 36* 35* 38* 51*  CREATININE 1.19  1.14 1.62* 1.78*  CALCIUM 8.8* 8.7* 8.2* 8.4*  MG  --   --  1.3* 1.9  PHOS  --   --  3.9 3.8   GFR: Estimated Creatinine Clearance: 23.2 mL/min (A) (by C-G formula based on SCr of 1.78 mg/dL (H)). Liver Function Tests: Recent Labs  Lab 06/26/19 2203 06/28/19 0417 06/29/19 0433  AST 26 23 21   ALT 23 17 16   ALKPHOS 127* 99 98  BILITOT 0.8 1.3* 1.4*  PROT 6.8 6.1* 6.2*  ALBUMIN 3.3* 2.9* 2.9*   No results for input(s): LIPASE, AMYLASE in the last 168 hours. No results for input(s): AMMONIA in the last 168 hours. Coagulation Profile: No results for input(s): INR, PROTIME in the last 168 hours. Cardiac Enzymes: No results for input(s): CKTOTAL, CKMB, CKMBINDEX, TROPONINI in the last 168 hours. BNP (last 3 results) No results for input(s): PROBNP in the last 8760 hours. HbA1C: No results for input(s): HGBA1C in the last 72 hours. CBG: No results for input(s): GLUCAP in the last 168 hours. Lipid Profile: No results for input(s): CHOL, HDL, LDLCALC, TRIG, CHOLHDL, LDLDIRECT in the last 72 hours. Thyroid Function Tests: No results for input(s): TSH, T4TOTAL, FREET4, T3FREE, THYROIDAB in the last 72 hours. Anemia Panel: Recent Labs    06/28/19 0417  VITAMINB12 898  FOLATE 29.4  FERRITIN 281  TIBC 193*  IRON 22*  RETICCTPCT 1.6   Sepsis Labs: No results for input(s): PROCALCITON, LATICACIDVEN in the last 168 hours.  Recent Results (from the past 240 hour(s))  Respiratory Panel by RT PCR (Flu A&B, Covid) - Nasopharyngeal Swab     Status: None   Collection Time: 06/27/19 12:37 AM   Specimen: Nasopharyngeal Swab  Result Value Ref Range Status   SARS Coronavirus 2 by RT PCR NEGATIVE NEGATIVE Final    Comment: (NOTE) SARS-CoV-2 target nucleic acids are NOT DETECTED. The SARS-CoV-2 RNA is generally detectable in upper respiratoy specimens during the acute phase of infection. The lowest concentration of SARS-CoV-2 viral copies this assay can detect is 131 copies/mL. A  negative result does not preclude SARS-Cov-2 infection and should not be used as the sole basis for treatment or other patient management decisions. A negative result may occur with  improper specimen collection/handling, submission of specimen other than nasopharyngeal swab, presence of viral mutation(s) within the areas targeted by this assay, and inadequate number of viral copies (<131 copies/mL). A negative result must be combined with clinical observations, patient history, and epidemiological information. The expected result is Negative. Fact Sheet for Patients:  PinkCheek.be Fact Sheet for Healthcare Providers:  GravelBags.it This test is not yet ap proved or cleared by the [place] FDA and  has been authorized for detection and/or diagnosis of SARS-CoV-2 by FDA under an Emergency Use Authorization (EUA). This EUA will remain  in effect (meaning this test can be used) for the duration of the COVID-19 declaration under Section 564(b)(1) of the Act, 21 U.S.C. section 360bbb-3(b)(1), unless the authorization is terminated or revoked sooner.    Influenza A by PCR NEGATIVE NEGATIVE Final   Influenza B by PCR NEGATIVE NEGATIVE Final    Comment: (NOTE) The Xpert Xpress SARS-CoV-2/FLU/RSV assay is intended as an aid in  the diagnosis of influenza from Nasopharyngeal swab specimens and  should not be used as a sole basis for treatment. Nasal washings and  aspirates are unacceptable for  Xpert Xpress SARS-CoV-2/FLU/RSV  testing. Fact Sheet for Patients: PinkCheek.be Fact Sheet for Healthcare Providers: GravelBags.it This test is not yet approved or cleared by the [place] FDA and  has been authorized for detection and/or diagnosis of SARS-CoV-2 by  FDA under an Emergency Use Authorization (EUA). This EUA will remain  in effect (meaning this test can be used) for  the duration of the  Covid-19 declaration under Section 564(b)(1) of the Act, 21  U.S.C. section 360bbb-3(b)(1), unless the authorization is  terminated or revoked. Performed at [facility], [place] [address] [place], [place] [address]      RN Pressure Injury Documentation:     Estimated body mass index is 25.59 kg/m as calculated from the following:   Height as of this encounter: 5\' 7"  (1.702 m).   Weight as of this encounter: 74.1 kg.  Malnutrition Type:      Malnutrition Characteristics:      Nutrition Interventions:    Radiology Studies: DG CHEST PORT 1 VIEW  Result Date: 06/29/2019 CLINICAL DATA:  86 year old male with history of shortness of breath. EXAM: PORTABLE CHEST 1 VIEW COMPARISON:  Chest x-ray 06/28/2019. FINDINGS: Lung volumes are low. Small left pleural effusion. Bibasilar opacities favored to reflect areas of subsegmental atelectasis. No definite consolidative airspace disease. Cephalization of the pulmonary vasculature with some indistinct interstitial markings, indicative of mild interstitial pulmonary edema. Moderate cardiomegaly. Upper mediastinal contours are distorted by patient's rotation to the right. Aortic atherosclerosis. IMPRESSION: 1. The appearance of the chest suggests mild congestive heart failure, similar to the prior study, as above. 2. Aortic atherosclerosis. Electronically Signed   By: Vinnie Langton M.D.   On: 06/29/2019 07:52   DG CHEST PORT 1 VIEW  Result Date: 06/28/2019 CLINICAL DATA:  86 year old male with shortness of breath. Lower extremity edema. EXAM: PORTABLE CHEST 1 VIEW COMPARISON:  Portable chest 06/26/2019 and earlier. FINDINGS: Portable AP semi upright view at 0558 hours. Stable cardiomegaly and mediastinal contours. Stable lung volumes. Bilateral pulmonary interstitial opacity and mild bibasilar veiling opacity is stable. No superimposed pneumothorax. No consolidation identified. Paucity of bowel gas in the  upper abdomen. Visualized tracheal air column is within normal limits. No acute osseous abnormality identified. IMPRESSION: Cardiomegaly with suspected pulmonary interstitial edema and small pleural effusions. Stable ventilation since 06/26/2019. Electronically Signed   By: Genevie Ann M.D.   On: 06/28/2019 07:14   Scheduled Meds: . amLODipine  5 mg Oral QPM  . apixaban  2.5 mg Oral BID  . diclofenac Sodium  2 g Topical QID  . doxazosin  2 mg Oral Daily  . doxycycline  100 mg Oral Q12H  . guaiFENesin  1,200 mg Oral BID  . ipratropium  0.5 mg Nebulization Q6H  . latanoprost  1 drop Both Eyes QHS  . levalbuterol  0.63 mg Nebulization Q6H  . metoprolol succinate  25 mg Oral Daily  . pantoprazole  40 mg Oral q morning - 10a  . polyvinyl alcohol  1 drop Both Eyes QHS  . potassium chloride  20 mEq Oral Daily  . vitamin B-12  500 mcg Oral q morning - 10a   Continuous Infusions:   LOS: 3 days   Kerney Elbe, DO [facility] PAGER is on [place]  If 7PM-7AM, please contact night-coverage [email or web address]

## 2019-06-29 NOTE — Progress Notes (Signed)
Progress Note  Patient Name: TYSIR WOODY Date of Encounter: 06/29/2019  Primary Cardiologist: Cristopher Peru, MD   Subjective   No chest pain. Still with dyspnea. "I am wheezing."  Inpatient Medications    Scheduled Meds: . amLODipine  5 mg Oral QPM  . apixaban  5 mg Oral BID  . carvedilol  3.125 mg Oral BID WC  . doxazosin  2 mg Oral Daily  . doxycycline  100 mg Oral Q12H  . latanoprost  1 drop Both Eyes QHS  . pantoprazole  40 mg Oral q morning - 10a  . polyvinyl alcohol  1 drop Both Eyes QHS  . potassium chloride  20 mEq Oral Daily  . vitamin B-12  500 mcg Oral q morning - 10a   Continuous Infusions:  PRN Meds: acetaminophen, ipratropium, levalbuterol, ondansetron **OR** ondansetron (ZOFRAN) IV   Vital Signs    Vitals:   06/28/19 1331 06/28/19 1747 06/28/19 2156 06/29/19 0637  BP: (!) 131/53 (!) 130/55 (!) 116/58 140/65  Pulse: 80 87 80 92  Resp: 19 18 18    Temp: 97.7 F (36.5 C)  98.9 F (37.2 C) 98.3 F (36.8 C)  TempSrc: Oral  Oral Oral  SpO2: 96% 95% 93% 95%  Weight:    74.1 kg  Height:        Intake/Output Summary (Last 24 hours) at 06/29/2019 0923 Last data filed at 06/29/2019 [id number] Gross per 24 hour  Intake 660 ml  Output 1100 ml  Net -440 ml   Filed Weights   06/27/19 1358 06/28/19 0506 06/29/19 0637  Weight: 77.1 kg 74.5 kg 74.1 kg    Telemetry    Atrial fibiwith a CVR/RVR - Personally Reviewed  ECG    none - Personally Reviewed  Physical Exam   GEN: No acute distress.   Neck: 7 cm JVD Cardiac: IRIRR, no murmurs, rubs, or gallops.  Respiratory: bilateral expiratory wheezes GI: Soft, nontender, non-distended  MS: trace edema; No deformity. Neuro:  Nonfocal  Psych: Normal affect   Labs    Chemistry Recent Labs  Lab 06/26/19 2203 06/26/19 2203 06/27/19 0542 06/28/19 0417 06/29/19 0433  NA 140   < > 138 138 138  K 4.0   < > 4.8 3.4* 4.0  CL 108   < > 105 97* 98  CO2 22   < > 25 28 29   GLUCOSE 114*   < > 87 104*  116*  BUN 36*   < > 35* 38* 51*  CREATININE 1.19   < > 1.14 1.62* 1.78*  CALCIUM 8.8*   < > 8.7* 8.2* 8.4*  PROT 6.8  --   --  6.1* 6.2*  ALBUMIN 3.3*  --   --  2.9* 2.9*  AST 26  --   --  23 21  ALT 23  --   --  17 16  ALKPHOS 127*  --   --  99 98  BILITOT 0.8  --   --  1.3* 1.4*  GFRNONAA 52*   < > 54* 36* 32*  GFRAA 60*   < > >60 41* 37*  ANIONGAP 10   < > 8 13 11    < > = values in this interval not displayed.     Hematology Recent Labs  Lab 06/27/19 0542 06/28/19 0417 06/29/19 0433  WBC 10.2 12.1* 11.0*  RBC 2.97* 2.76*  2.70* 2.81*  HGB 9.0* 8.3* 8.5*  HCT 28.4* 26.4* 26.7*  MCV 95.6 95.7 95.0  MCH 30.3  30.1 30.2  MCHC 31.7 31.4 31.8  RDW 14.4 14.5 14.2  PLT 258 235 237    Cardiac EnzymesNo results for input(s): TROPONINI in the last 168 hours. No results for input(s): TROPIPOC in the last 168 hours.   BNP Recent Labs  Lab 06/26/19 2203  BNP 165.6*     DDimer No results for input(s): DDIMER in the last 168 hours.   Radiology    DG CHEST PORT 1 VIEW  Result Date: 06/29/2019 CLINICAL DATA:  86 year old male with history of shortness of breath. EXAM: PORTABLE CHEST 1 VIEW COMPARISON:  Chest x-ray 06/28/2019. FINDINGS: Lung volumes are low. Small left pleural effusion. Bibasilar opacities favored to reflect areas of subsegmental atelectasis. No definite consolidative airspace disease. Cephalization of the pulmonary vasculature with some indistinct interstitial markings, indicative of mild interstitial pulmonary edema. Moderate cardiomegaly. Upper mediastinal contours are distorted by patient's rotation to the right. Aortic atherosclerosis. IMPRESSION: 1. The appearance of the chest suggests mild congestive heart failure, similar to the prior study, as above. 2. Aortic atherosclerosis. Electronically Signed   By: Vinnie Langton M.D.   On: 06/29/2019 07:52   DG CHEST PORT 1 VIEW  Result Date: 06/28/2019 CLINICAL DATA:  86 year old male with shortness of breath.  Lower extremity edema. EXAM: PORTABLE CHEST 1 VIEW COMPARISON:  Portable chest 06/26/2019 and earlier. FINDINGS: Portable AP semi upright view at 0558 hours. Stable cardiomegaly and mediastinal contours. Stable lung volumes. Bilateral pulmonary interstitial opacity and mild bibasilar veiling opacity is stable. No superimposed pneumothorax. No consolidation identified. Paucity of bowel gas in the upper abdomen. Visualized tracheal air column is within normal limits. No acute osseous abnormality identified. IMPRESSION: Cardiomegaly with suspected pulmonary interstitial edema and small pleural effusions. Stable ventilation since 06/26/2019. Electronically Signed   By: Genevie Ann M.D.   On: 06/28/2019 07:14   ECHOCARDIOGRAM COMPLETE  Result Date: 06/27/2019    ECHOCARDIOGRAM REPORT   Patient Name:   AVROM GRINDEL Date of Exam: 06/27/2019 Medical Rec #:  [id number]          Height:       67.0 in Accession #:    [id number]         Weight:       145.1 lb Date of Birth:  Apr 13, 1921          BSA:          1.764 m Patient Age:    89 years           BP:           122/97 mmHg Patient Gender: M                  HR:           75 bpm. Exam Location:  Inpatient Procedure: 2D Echo Indications:    Congestive Heart Failure 428.0 / I50.9  History:        Patient has prior history of Echocardiogram examinations, most                 recent 08/26/2018. CHF, TIA; Risk Factors:Hypertension. CKD.  Sonographer:    Vikki Ports Turrentine Referring Phys: [place]  1. Left ventricular ejection fraction, by estimation, is 55 to 60%. The left ventricle has normal function. The left ventricle has no regional wall motion abnormalities. Left ventricular diastolic parameters are indeterminate.  2. Right ventricular systolic function is mildly reduced. The right ventricular size is normal. There is mildly elevated pulmonary artery  systolic pressure. The estimated right ventricular systolic pressure is [id number] mmHg.  3. Left  atrial size was moderately dilated.  4. Right atrial size was mildly dilated.  5. The mitral valve is normal in structure. Trivial mitral valve regurgitation. No evidence of mitral stenosis.  6. The aortic valve is tricuspid. Aortic valve regurgitation is not visualized. Mild aortic valve sclerosis is present, with no evidence of aortic valve stenosis.  7. The inferior vena cava is dilated in size with >50% respiratory variability, suggesting right atrial pressure of 8 mmHg.  8. The patient is in atrial fibrillation. FINDINGS  Left Ventricle: Left ventricular ejection fraction, by estimation, is 55 to 60%. The left ventricle has normal function. The left ventricle has no regional wall motion abnormalities. The left ventricular internal cavity size was normal in size. There is  no left ventricular hypertrophy. Left ventricular diastolic parameters are indeterminate. Right Ventricle: The right ventricular size is normal. No increase in right ventricular wall thickness. Right ventricular systolic function is mildly reduced. There is mildly elevated pulmonary artery systolic pressure. The tricuspid regurgitant velocity  is 2.43 m/s, and with an assumed right atrial pressure of 8 mmHg, the estimated right ventricular systolic pressure is [id number] mmHg. Left Atrium: Left atrial size was moderately dilated. Right Atrium: Right atrial size was mildly dilated. Pericardium: Trivial pericardial effusion is present. Mitral Valve: The mitral valve is normal in structure. Trivial mitral valve regurgitation. No evidence of mitral valve stenosis. Tricuspid Valve: The tricuspid valve is normal in structure. Tricuspid valve regurgitation is trivial. Aortic Valve: The aortic valve is tricuspid. Aortic valve regurgitation is not visualized. Mild aortic valve sclerosis is present, with no evidence of aortic valve stenosis. Pulmonic Valve: The pulmonic valve was normal in structure. Pulmonic valve regurgitation is not visualized. Aorta: The  aortic root is normal in size and structure. Venous: The inferior vena cava is dilated in size with greater than 50% respiratory variability, suggesting right atrial pressure of 8 mmHg. IAS/Shunts: No atrial level shunt detected by color flow Doppler.  LEFT VENTRICLE PLAX 2D LVIDd:         5.00 cm LVIDs:         3.63 cm LV PW:         0.92 cm LV IVS:        0.96 cm LVOT diam:     2.00 cm LV SV:         64 LV SV Index:   36 LVOT Area:     3.14 cm  RIGHT VENTRICLE RV S prime:     6.85 cm/s LEFT ATRIUM             Index       RIGHT ATRIUM           Index LA diam:        4.30 cm 2.44 cm/m  RA Area:     21.70 cm LA Vol (A2C):   98.0 ml 55.55 ml/m RA Volume:   60.50 ml  34.29 ml/m LA Vol (A4C):   63.0 ml 35.71 ml/m LA Biplane Vol: 82.2 ml 46.59 ml/m  AORTIC VALVE LVOT Vmax:   111.93 cm/s LVOT Vmean:  79.000 cm/s LVOT VTI:    0.202 m  AORTA Ao Root diam: 3.10 cm MITRAL VALVE                TRICUSPID VALVE MV Area (PHT): 3.48 cm     TR Peak grad:   23.6 mmHg MV Decel Time:  218 msec     TR Vmax:        243.00 cm/s MV E velocity: 108.00 cm/s MV A velocity: 51.40 cm/s   SHUNTS MV E/A ratio:  2.10         Systemic VTI:  0.20 m                             Systemic Diam: 2.00 cm Loralie Champagne MD Electronically signed by Loralie Champagne MD Signature Date/Time: 06/27/2019/4:31:31 PM    Final     Cardiac Studies   none  Patient Profile     86 y.o. male admitted with sob and atrial fib and RVR  Assessment & Plan    1. A/C diastolic heart failure - his is still sob. His weight is unchanged. Creatinine essentially unchanged. We will give some IV lasix. 2. Atrial fib - his rates are increased but not severely. I will switch to toprol instead of coreg. More beta 2 selectivity. 3. Discitis - continue doxycycline.     For questions or updates, please contact [place] Please consult [email or web address] for contact info under Cardiology/STEMI.      Signed, Cristopher Peru, MD  06/29/2019, 9:23 AM  Patient ID: Gaston Islam, male   DOB: 01-29-1922, 86 y.o.   MRN: [id number]

## 2019-06-30 ENCOUNTER — Inpatient Hospital Stay (HOSPITAL_COMMUNITY): Payer: Medicare Other

## 2019-06-30 DIAGNOSIS — R062 Wheezing: Secondary | ICD-10-CM

## 2019-06-30 DIAGNOSIS — I10 Essential (primary) hypertension: Secondary | ICD-10-CM

## 2019-06-30 DIAGNOSIS — G459 Transient cerebral ischemic attack, unspecified: Secondary | ICD-10-CM

## 2019-06-30 DIAGNOSIS — N179 Acute kidney failure, unspecified: Secondary | ICD-10-CM

## 2019-06-30 DIAGNOSIS — N182 Chronic kidney disease, stage 2 (mild): Secondary | ICD-10-CM

## 2019-06-30 DIAGNOSIS — M4644 Discitis, unspecified, thoracic region: Secondary | ICD-10-CM

## 2019-06-30 DIAGNOSIS — R197 Diarrhea, unspecified: Secondary | ICD-10-CM

## 2019-06-30 DIAGNOSIS — I4811 Longstanding persistent atrial fibrillation: Secondary | ICD-10-CM

## 2019-06-30 LAB — CBC WITH DIFFERENTIAL/PLATELET
Abs Immature Granulocytes: 0.08 10*3/uL — ABNORMAL HIGH (ref 0.00–0.07)
Basophils Absolute: 0.1 10*3/uL (ref 0.0–0.1)
Basophils Relative: 1 %
Eosinophils Absolute: 0.2 10*3/uL (ref 0.0–0.5)
Eosinophils Relative: 2 %
HCT: 28.3 % — ABNORMAL LOW (ref 39.0–52.0)
Hemoglobin: 9 g/dL — ABNORMAL LOW (ref 13.0–17.0)
Immature Granulocytes: 1 %
Lymphocytes Relative: 11 %
Lymphs Abs: 1.3 10*3/uL (ref 0.7–4.0)
MCH: 30.1 pg (ref 26.0–34.0)
MCHC: 31.8 g/dL (ref 30.0–36.0)
MCV: 94.6 fL (ref 80.0–100.0)
Monocytes Absolute: 2 10*3/uL — ABNORMAL HIGH (ref 0.1–1.0)
Monocytes Relative: 16 %
Neutro Abs: 8.9 10*3/uL — ABNORMAL HIGH (ref 1.7–7.7)
Neutrophils Relative %: 69 %
Platelets: 248 10*3/uL (ref 150–400)
RBC: 2.99 MIL/uL — ABNORMAL LOW (ref 4.22–5.81)
RDW: 13.9 % (ref 11.5–15.5)
WBC: 12.6 10*3/uL — ABNORMAL HIGH (ref 4.0–10.5)
nRBC: 0 % (ref 0.0–0.2)

## 2019-06-30 LAB — COMPREHENSIVE METABOLIC PANEL
ALT: 18 U/L (ref 0–44)
AST: 21 U/L (ref 15–41)
Albumin: 3 g/dL — ABNORMAL LOW (ref 3.5–5.0)
Alkaline Phosphatase: 107 U/L (ref 38–126)
Anion gap: 9 (ref 5–15)
BUN: 50 mg/dL — ABNORMAL HIGH (ref 8–23)
CO2: 31 mmol/L (ref 22–32)
Calcium: 8.6 mg/dL — ABNORMAL LOW (ref 8.9–10.3)
Chloride: 96 mmol/L — ABNORMAL LOW (ref 98–111)
Creatinine, Ser: 1.78 mg/dL — ABNORMAL HIGH (ref 0.61–1.24)
GFR calc Af Amer: 37 mL/min — ABNORMAL LOW (ref >60)
GFR calc non Af Amer: 32 mL/min — ABNORMAL LOW (ref >60)
Glucose, Bld: 118 mg/dL — ABNORMAL HIGH (ref 70–99)
Potassium: 3.8 mmol/L (ref 3.5–5.1)
Sodium: 136 mmol/L (ref 135–145)
Total Bilirubin: 1.7 mg/dL — ABNORMAL HIGH (ref 0.3–1.2)
Total Protein: 6.7 g/dL (ref 6.5–8.1)

## 2019-06-30 LAB — PHOSPHORUS: Phosphorus: 4 mg/dL (ref 2.5–4.6)

## 2019-06-30 LAB — MAGNESIUM: Magnesium: 1.7 mg/dL (ref 1.7–2.4)

## 2019-06-30 MED ORDER — FUROSEMIDE 10 MG/ML IJ SOLN
40.0000 mg | Freq: Once | INTRAMUSCULAR | Status: AC
  Administered 2019-06-30: 40 mg via INTRAVENOUS
  Filled 2019-06-30: qty 4

## 2019-06-30 MED ORDER — MAGNESIUM SULFATE IN D5W 1-5 GM/100ML-% IV SOLN
1.0000 g | Freq: Once | INTRAVENOUS | Status: AC
  Administered 2019-06-30: 1 g via INTRAVENOUS
  Filled 2019-06-30: qty 100

## 2019-06-30 NOTE — Progress Notes (Signed)
Progress Note  Patient Name: Cody Mendoza Date of Encounter: 06/30/2019  Primary Cardiologist: Cristopher Peru, MD   Subjective   Dyspnea improved. No chest pain.   Inpatient Medications    Scheduled Meds: . amLODipine  5 mg Oral QPM  . apixaban  2.5 mg Oral BID  . diclofenac Sodium  2 g Topical QID  . doxazosin  2 mg Oral Daily  . doxycycline  100 mg Oral Q12H  . guaiFENesin  1,200 mg Oral BID  . ipratropium  0.5 mg Nebulization TID  . latanoprost  1 drop Both Eyes QHS  . levalbuterol  0.63 mg Nebulization TID  . metoprolol succinate  25 mg Oral Daily  . pantoprazole  40 mg Oral q morning - 10a  . polyvinyl alcohol  1 drop Both Eyes QHS  . potassium chloride  20 mEq Oral Daily  . vitamin B-12  500 mcg Oral q morning - 10a   Continuous Infusions: . magnesium sulfate bolus IVPB 1 g (06/30/19 0911)   PRN Meds: acetaminophen, ipratropium, levalbuterol, ondansetron **OR** ondansetron (ZOFRAN) IV   Vital Signs    Vitals:   06/29/19 2036 06/30/19 0452 06/30/19 0500 06/30/19 0928  BP: (!) 119/52 117/75    Pulse: 79 75    Resp: 20 20    Temp: 97.7 F (36.5 C) (!) 97.5 F (36.4 C)    TempSrc: Oral Oral    SpO2: 93% 93%  93%  Weight:   71.9 kg   Height:        Intake/Output Summary (Last 24 hours) at 06/30/2019 0939 Last data filed at 06/30/2019 0840 Gross per 24 hour  Intake 780 ml  Output 2300 ml  Net -1520 ml   Filed Weights   06/28/19 0506 06/29/19 0637 06/30/19 0500  Weight: 74.5 kg 74.1 kg 71.9 kg    Telemetry    Atrial fib with a controlled VR - Personally Reviewed  ECG    none - Personally Reviewed  Physical Exam   GEN: elderly man, no acute distress.   Neck: No JVD Cardiac: IRIRR, no murmurs, rubs, or gallops.  Respiratory: Clear to auscultation bilaterally. Wheezes have resolved. GI: Soft, nontender, non-distended  MS: No edema; No deformity. Neuro:  Nonfocal  Psych: Normal affect   Labs    Chemistry Recent Labs  Lab 06/28/19  0417 06/29/19 0433 06/30/19 0522  NA 138 138 136  K 3.4* 4.0 3.8  CL 97* 98 96*  CO2 28 29 31   GLUCOSE 104* 116* 118*  BUN 38* 51* 50*  CREATININE 1.62* 1.78* 1.78*  CALCIUM 8.2* 8.4* 8.6*  PROT 6.1* 6.2* 6.7  ALBUMIN 2.9* 2.9* 3.0*  AST 23 21 21   ALT 17 16 18   ALKPHOS 99 98 107  BILITOT 1.3* 1.4* 1.7*  GFRNONAA 36* 32* 32*  GFRAA 41* 37* 37*  ANIONGAP 13 11 9      Hematology Recent Labs  Lab 06/28/19 0417 06/29/19 0433 06/30/19 0522  WBC 12.1* 11.0* 12.6*  RBC 2.76*  2.70* 2.81* 2.99*  HGB 8.3* 8.5* 9.0*  HCT 26.4* 26.7* 28.3*  MCV 95.7 95.0 94.6  MCH 30.1 30.2 30.1  MCHC 31.4 31.8 31.8  RDW 14.5 14.2 13.9  PLT 235 237 248    Cardiac EnzymesNo results for input(s): TROPONINI in the last 168 hours. No results for input(s): TROPIPOC in the last 168 hours.   BNP Recent Labs  Lab 06/26/19 2203  BNP 165.6*     DDimer No results for input(s): DDIMER  in the last 168 hours.   Radiology    DG CHEST PORT 1 VIEW  Result Date: 06/29/2019 CLINICAL DATA:  86 year old male with history of shortness of breath. EXAM: PORTABLE CHEST 1 VIEW COMPARISON:  Chest x-ray 06/28/2019. FINDINGS: Lung volumes are low. Small left pleural effusion. Bibasilar opacities favored to reflect areas of subsegmental atelectasis. No definite consolidative airspace disease. Cephalization of the pulmonary vasculature with some indistinct interstitial markings, indicative of mild interstitial pulmonary edema. Moderate cardiomegaly. Upper mediastinal contours are distorted by patient's rotation to the right. Aortic atherosclerosis. IMPRESSION: 1. The appearance of the chest suggests mild congestive heart failure, similar to the prior study, as above. 2. Aortic atherosclerosis. Electronically Signed   By: Vinnie Langton M.D.   On: 06/29/2019 07:52    Cardiac Studies   none  Patient Profile     86 y.o. male admitted with atrial fib and sob due to diastolic heart failure  Assessment & Plan     1. Peristent atrial fib - his rates are reasonably well controlled. Continue metoprolol. Might uptitrate tomorrow. 2. Acute on chronic diastolic heart failure -his sob is improved as is his wheezing after IV lasix. Kidney function is unchanged. We will give another IV lasix.     For questions or updates, please contact [place] Please consult [email or web address] for contact info under Cardiology/STEMI.      Signed, Cristopher Peru, MD  06/30/2019, 9:39 AM  Patient ID: Gaston Islam, male   DOB: Feb 03, 1922, 86 y.o.   MRN: [id number]

## 2019-06-30 NOTE — Progress Notes (Signed)
PROGRESS NOTE    Cody Mendoza  [id number] DOB: 11-Apr-1921 DOA: 06/26/2019 PCP: Crist Infante, MD   Brief Narrative:  The patient is a elderly generally bedbound 86 year old Caucasian male with a past medical history significant for but not limited to atrial fibrillation, hypertension, mitral valve insufficiency, GERD, history of [name] syndrome, history of TIA.  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  He is admitted for acute diastolic CHF and was placed on IV Lasix 40 mg every 12.  Lower extremity duplex was done and showed DVT in the lower extremities bilaterally.  On admission his BNP was elevated to 165.6.  ECHOCARDIOGRAM is to be repeated and showed an EF of 55 to 60% with no wall motion abnormalities and mildly reduced right ventricular systolic function.  Left ventricular diastolic parameters were indeterminate.  Subsequently the patient had an AKI after diuresis and his ARB so this was been held.  Cardiology is consulted for further evaluation recommendations and appreciate their assistance in management.  Cardiology recommended Lasix after his worsening Renal Fxn but they have resumed it now and he received a dose of IV Lasix yesterday and will be getting another dose of IV Lasix today.  His breathing is improved but he still complained of some diarrhea and I discussed the case with infectious disease and Dr. Graylon Good who recommends holding his antibiotic of doxycycline and she currently does not feel the patient has a high suspicion for C. difficile.  Assessment & Plan:   Principal Problem:   Acute CHF (congestive heart failure) (HCC) Active Problems:   Hypertension   Atrial fibrillation (HCC)   Discitis of thoracic region   CKD (chronic kidney disease) stage 2, GFR 60-89 ml/min   TIA  (transient ischemic attack)  Acute on chronic diastolic CHF and right-sided heart failure -last EF measured in 2020 was 60 to 65%.  -BNP on Admission was 165.6 and presented with lower extremity swelling as well as scrotal swelling -Initially was placed Lasix 40 mg IV every 12h after being given 1 dose of 40 mg of Lasix in the ED but had held it given AKI; Now Lasix is going to be resumed and he was given 40 mg of IV Lasix yesterday and he will get another dose of IV Lasix again today -closely follow intake output metabolic panel -Patient is - 5.290 liters since Admission and weight is down 11 pounds since admission -Carvedilol 3.125 mg po BID changed to metoprolol succinate 25 mg daily by cardiology; Losartan 50 mg po BID now held due AKI and will consider resuming in the AM  -CXR this AM is showing "Cardiomegaly and mild pulmonary edema are stable. Probable small effusion on the left. No other interval changes." -Check 2D echo showed an EF of 55 to 60% with indeterminate left ventricular diastolic parameters and mildly reduced right-sided systolic dysfunction -Given that patient is bedbound will check Dopplers of the lower extremity to rule out DVT and this was Negative  -Cardiology suspect the patient has been getting more salt in his diet since moving to this facility.  They also feel that he needs Lasix in outpatient setting -Continue to monitor signs and symptoms of volume overload and repeat chest x-ray in the a.m.  Dyspena/Wheezing, improved -from Above -Never Smoked -continue with Diuresis -Scheduled Xopenex/Atrovent -Repeat CXR in the  a.m.; chest x-ray from today as above -Add Flutter Valve, Incentive Spirometer, and Guaifenesin -Patient's wheezing is improved  Chronic A. Fib  -Patient is on Carvedilol 3.125 mg po BID and cardiology is changing this to Toprol for more beta-2 selectivity -C/w Apixaban 5 mg po BID -Continue to monitor on telemetry carefully.  Hypertension    -C/w Carvedilol 3.125 mg po BID, Losartan 50 mg po BID and Amlodipine 5 mg po Daily    T8-T9 Discitis  -for which patient on chronic Doxycycline therapy being followed by Dr. Graylon Good infectious disease; I spoke with Dr. Graylon Good who recommends discontinuing doxycycline given the patient diarrhea and she feels that this is more antibiotic mediated and does not clinically suspect C. difficile given the patient being afebrile with a very minimal white count  Spinal Stenosis  -Bedbound being followed by neurosurgeon Dr. Kathyrn Sheriff. -Get PT/OT to further evaluate and treat  Chronic Normocytic Anemia -Paitient's Hgb/Hct went from 9.0/28.5 -> 9.0/28.4 -> 8.3/26.4 -> 8.5/26.7 -> 9.0/28.3 -On B12 supplements with cyanocobalamin 500 mcg every morning orally -Check Anemia Panel; it showed an iron level of 22, U IBC of 171, TIBC 193, saturation ratios of 11%, ferritin level of 281, folate level of 29.4, and vitamin B12 898 -Continue to Monitor for S/Sx of Bleeding; Currently no overt bleeding noted -Repeat CBC in AM   Leukocytosis  -Mild and Likely Reactive -Currently Afebrile with a TMax of 99.5 in the last 24 hours -Already on antibiotics with chronic doxycycline for his T8/T9 infection and discitis but will stop this at the recommendations of infectious diseases -Continue to Monitor for S/Sx of Infection -Repeat CBC in AM   Hypokalemia -In the Setting of Diuresis  -K+ now 3.8 -Continue to Monitor and Replete as Necessary -Repeat CMP in AM   Hypomagnesemia -Patient magnesium level this morning was 1.3 and improved to 1.7 -Replete with IV mag sulfate 1 g today -Continue to monitor replete as necessary -Repeat magnesium level in a.m.  AKI on CKD Stage 3 -Patient's BUNs/creatinine from 35/1.14 and worsened to 51/1.78 today in the setting of diuresis and his ARB; has been stable from yesterday is 50/.1.78 today -Holding losartan 50 mg p.o. twice daily but lasix has has been resumed by  cardiology -Avoid nephrotoxic medications, contrast dyes, hypotension and renally dose medications -Continue to monitor and trend renal function carefully -Repeat CMP in the a.m.  Hyperbilirubinemia -Mild with a total bilirubin of 1.3 and has now slightly worsened to 1.7 -Continue monitor and trend and repeat CMP in a.m.  Diarrhea -Mild; patient is on chronic doxycycline and I spoke with infectious diseases who recommends holding doxycycline to see if this is the cause of the patient's diarrhea -Continue to monitor carefully as he is afebrile and does have a slight leukocytosis and went from 11.0 to 12.6 -Not on any stool softeners or any laxatives -If continues to worsen will obtain a GI pathogen panel and possibly C. difficile PCR given that has been on chronic doxycycline spoke with Dr. Graylon Good who recommends against testing for C. difficile currently and recommends holding the doxycycline to see if the diarrhea improves next-in the interim we have ordered a GI pathogen panel  Right shoulder pain -We will start Voltaren gel -Continue to monitor and if necessary will obtain x-ray  DVT prophylaxis: Anticoagulated with Apixaban Code Status: DO NOT RESUSCITATE  Family Communication: Discussed with Son at Bedside  Disposition Plan: Pending clearance by cardiology given the patient is from an ALF and needs to be  diuresed back to back dry weight  Status is: Inpatient  Remains inpatient appropriate because:IV treatments appropriate due to intensity of illness or inability to take PO and Inpatient level of care appropriate due to severity of illness   Dispo: The patient is from: ALF              Anticipated d/c is to: ALF              Anticipated d/c date is: 2 days              Patient currently is not medically stable to d/c.  Consultants:   Cardiology   Discussed the case with infectious diseases Dr. Graylon Good   Procedures:  ECHOCARDIOGRAM IMPRESSIONS    1. Left ventricular  ejection fraction, by estimation, is 55 to 60%. The  left ventricle has normal function. The left ventricle has no regional  wall motion abnormalities. Left ventricular diastolic parameters are  indeterminate.  2. Right ventricular systolic function is mildly reduced. The right  ventricular size is normal. There is mildly elevated pulmonary artery  systolic pressure. The estimated right ventricular systolic pressure is  [id number] mmHg.  3. Left atrial size was moderately dilated.  4. Right atrial size was mildly dilated.  5. The mitral valve is normal in structure. Trivial mitral valve  regurgitation. No evidence of mitral stenosis.  6. The aortic valve is tricuspid. Aortic valve regurgitation is not  visualized. Mild aortic valve sclerosis is present, with no evidence of  aortic valve stenosis.  7. The inferior vena cava is dilated in size with >50% respiratory  variability, suggesting right atrial pressure of 8 mmHg.  8. The patient is in atrial fibrillation.   FINDINGS  Left Ventricle: Left ventricular ejection fraction, by estimation, is 55  to 60%. The left ventricle has normal function. The left ventricle has no  regional wall motion abnormalities. The left ventricular internal cavity  size was normal in size. There is  no left ventricular hypertrophy. Left ventricular diastolic parameters  are indeterminate.   Right Ventricle: The right ventricular size is normal. No increase in  right ventricular wall thickness. Right ventricular systolic function is  mildly reduced. There is mildly elevated pulmonary artery systolic  pressure. The tricuspid regurgitant velocity  is 2.43 m/s, and with an assumed right atrial pressure of 8 mmHg, the  estimated right ventricular systolic pressure is [id number] mmHg.   Left Atrium: Left atrial size was moderately dilated.   Right Atrium: Right atrial size was mildly dilated.   Pericardium: Trivial pericardial effusion is present.   Mitral  Valve: The mitral valve is normal in structure. Trivial mitral  valve regurgitation. No evidence of mitral valve stenosis.   Tricuspid Valve: The tricuspid valve is normal in structure. Tricuspid  valve regurgitation is trivial.   Aortic Valve: The aortic valve is tricuspid. Aortic valve regurgitation is  not visualized. Mild aortic valve sclerosis is present, with no evidence  of aortic valve stenosis.   Pulmonic Valve: The pulmonic valve was normal in structure. Pulmonic valve  regurgitation is not visualized.   Aorta: The aortic root is normal in size and structure.   Venous: The inferior vena cava is dilated in size with greater than 50%  respiratory variability, suggesting right atrial pressure of 8 mmHg.   IAS/Shunts: No atrial level shunt detected by color flow Doppler.     LEFT VENTRICLE  PLAX 2D  LVIDd:     5.00 cm  LVIDs:  3.63 cm  LV PW:     0.92 cm  LV IVS:    0.96 cm  LVOT diam:   2.00 cm  LV SV:     64  LV SV Index:  36  LVOT Area:   3.14 cm     RIGHT VENTRICLE  RV S prime:   6.85 cm/s   LEFT ATRIUM       Index    RIGHT ATRIUM      Index  LA diam:    4.30 cm 2.44 cm/m RA Area:   21.70 cm  LA Vol (A2C):  98.0 ml 55.55 ml/m RA Volume:  60.50 ml 34.29 ml/m  LA Vol (A4C):  63.0 ml 35.71 ml/m  LA Biplane Vol: 82.2 ml 46.59 ml/m  AORTIC VALVE  LVOT Vmax:  111.93 cm/s  LVOT Vmean: 79.000 cm/s  LVOT VTI:  0.202 m    AORTA  Ao Root diam: 3.10 cm   MITRAL VALVE        TRICUSPID VALVE  MV Area (PHT): 3.48 cm   TR Peak grad:  23.6 mmHg  MV Decel Time: 218 msec   TR Vmax:    243.00 cm/s  MV E velocity: 108.00 cm/s  MV A velocity: 51.40 cm/s  SHUNTS  MV E/A ratio: 2.10     Systemic VTI: 0.20 m               Systemic Diam: 2.00 cm   Antimicrobials:  Anti-infectives (From admission, onward)   Start     Dose/Rate Route Frequency Ordered Stop   06/27/19  1000  doxycycline (VIBRA-TABS) tablet 100 mg  Status:  Discontinued     100 mg Oral Every 12 hours 06/27/19 0400 06/30/19 1144     Subjective: Seen and examined at bedside and states that his breathing and his wheezing is better today.  Still continues to have some diarrhea and states whenever he eats most of them.  States to keep bringing him solids.  No nausea or vomiting.  Denies any lightheadedness or dizziness.  No other concerns or complaints at this time.  Objective: Vitals:   06/30/19 0500 06/30/19 0928 06/30/19 1226 06/30/19 1520  BP:   (!) 109/53   Pulse:   79   Resp:   20   Temp:   99.5 F (37.5 C)   TempSrc:   Oral   SpO2:  93% 96% 96%  Weight: 71.9 kg     Height:        Intake/Output Summary (Last 24 hours) at 06/30/2019 1707 Last data filed at 06/30/2019 1451 Gross per 24 hour  Intake 660 ml  Output 1500 ml  Net -840 ml   Filed Weights   06/28/19 0506 06/29/19 0637 06/30/19 0500  Weight: 74.5 kg 74.1 kg 71.9 kg   Examination: Physical Exam:  Constitutional: WN/WD overweight Caucasian male currently no acute distress and his nausea breathing is better yesterday.  He appears a bit more comfortable., NAD and appears calm and comfortable Eyes: Lids and conjunctivae normal, sclerae anicteric  ENMT: External Ears, Nose appear normal.  Little hard of hearing.  Mucous membranes are moist. Neck: Appears normal, supple, no cervical masses, normal ROM, no appreciable thyromegaly; no JVD Respiratory: Diminished to auscultation bilaterally with coarse breath sounds, no wheezing, rales, rhonchi or crackles. Normal respiratory effort and patient is not tachypenic. No accessory muscle use.  Unlabored breathing Cardiovascular: Irregularly irregular, no murmurs / rubs / gallops. S1 and S2 auscultated.  Has 1+ extremity  edema. Abdomen: Soft, non-tender, mildly distended secondary body habitus. Bowel sounds positive.  GU: Deferred. Musculoskeletal: No clubbing / cyanosis of  digits/nails. No joint deformity upper and lower extremities.   Skin: No rashes, lesions, ulcers on limited skin evaluation. No induration; Warm and dry.  Neurologic: CN 2-12 grossly intact with no focal deficits. Romberg sign and cerebellar reflexes not assessed.  Psychiatric: Normal judgment and insight. Alert and oriented x 3. Normal mood and appropriate affect.   Data Reviewed: I have personally reviewed following labs and imaging studies  CBC: Recent Labs  Lab 06/26/19 2203 06/27/19 0542 06/28/19 0417 06/29/19 0433 06/30/19 0522  WBC 11.2* 10.2 12.1* 11.0* 12.6*  NEUTROABS 7.5  --  9.0* 7.1 8.9*  HGB 9.0* 9.0* 8.3* 8.5* 9.0*  HCT 28.5* 28.4* 26.4* 26.7* 28.3*  MCV 96.3 95.6 95.7 95.0 94.6  PLT 259 258 235 237 [id number]   Basic Metabolic Panel: Recent Labs  Lab 06/26/19 2203 06/27/19 0542 06/28/19 0417 06/29/19 0433 06/30/19 0522  NA 140 138 138 138 136  K 4.0 4.8 3.4* 4.0 3.8  CL 108 105 97* 98 96*  CO2 22 25 28 29 31   GLUCOSE 114* 87 104* 116* 118*  BUN 36* 35* 38* 51* 50*  CREATININE 1.19 1.14 1.62* 1.78* 1.78*  CALCIUM 8.8* 8.7* 8.2* 8.4* 8.6*  MG  --   --  1.3* 1.9 1.7  PHOS  --   --  3.9 3.8 4.0   GFR: Estimated Creatinine Clearance: 23.2 mL/min (A) (by C-G formula based on SCr of 1.78 mg/dL (H)). Liver Function Tests: Recent Labs  Lab 06/26/19 2203 06/28/19 0417 06/29/19 0433 06/30/19 0522  AST 26 23 21 21   ALT 23 17 16 18   ALKPHOS 127* 99 98 107  BILITOT 0.8 1.3* 1.4* 1.7*  PROT 6.8 6.1* 6.2* 6.7  ALBUMIN 3.3* 2.9* 2.9* 3.0*   No results for input(s): LIPASE, AMYLASE in the last 168 hours. No results for input(s): AMMONIA in the last 168 hours. Coagulation Profile: No results for input(s): INR, PROTIME in the last 168 hours. Cardiac Enzymes: No results for input(s): CKTOTAL, CKMB, CKMBINDEX, TROPONINI in the last 168 hours. BNP (last 3 results) No results for input(s): PROBNP in the last 8760 hours. HbA1C: No results for input(s): HGBA1C in the  last 72 hours. CBG: No results for input(s): GLUCAP in the last 168 hours. Lipid Profile: No results for input(s): CHOL, HDL, LDLCALC, TRIG, CHOLHDL, LDLDIRECT in the last 72 hours. Thyroid Function Tests: No results for input(s): TSH, T4TOTAL, FREET4, T3FREE, THYROIDAB in the last 72 hours. Anemia Panel: Recent Labs    06/28/19 0417  VITAMINB12 898  FOLATE 29.4  FERRITIN 281  TIBC 193*  IRON 22*  RETICCTPCT 1.6   Sepsis Labs: No results for input(s): PROCALCITON, LATICACIDVEN in the last 168 hours.  Recent Results (from the past 240 hour(s))  Respiratory Panel by RT PCR (Flu A&B, Covid) - Nasopharyngeal Swab     Status: None   Collection Time: 06/27/19 12:37 AM   Specimen: Nasopharyngeal Swab  Result Value Ref Range Status   SARS Coronavirus 2 by RT PCR NEGATIVE NEGATIVE Final    Comment: (NOTE) SARS-CoV-2 target nucleic acids are NOT DETECTED. The SARS-CoV-2 RNA is generally detectable in upper respiratoy specimens during the acute phase of infection. The lowest concentration of SARS-CoV-2 viral copies this assay can detect is 131 copies/mL. A negative result does not preclude SARS-Cov-2 infection and should not be used as the sole basis for treatment or  other patient management decisions. A negative result may occur with  improper specimen collection/handling, submission of specimen other than nasopharyngeal swab, presence of viral mutation(s) within the areas targeted by this assay, and inadequate number of viral copies (<131 copies/mL). A negative result must be combined with clinical observations, patient history, and epidemiological information. The expected result is Negative. Fact Sheet for Patients:  PinkCheek.be Fact Sheet for Healthcare Providers:  GravelBags.it This test is not yet ap proved or cleared by the [place] FDA and  has been authorized for detection and/or diagnosis of SARS-CoV-2  by FDA under an Emergency Use Authorization (EUA). This EUA will remain  in effect (meaning this test can be used) for the duration of the COVID-19 declaration under Section 564(b)(1) of the Act, 21 U.S.C. section 360bbb-3(b)(1), unless the authorization is terminated or revoked sooner.    Influenza A by PCR NEGATIVE NEGATIVE Final   Influenza B by PCR NEGATIVE NEGATIVE Final    Comment: (NOTE) The Xpert Xpress SARS-CoV-2/FLU/RSV assay is intended as an aid in  the diagnosis of influenza from Nasopharyngeal swab specimens and  should not be used as a sole basis for treatment. Nasal washings and  aspirates are unacceptable for Xpert Xpress SARS-CoV-2/FLU/RSV  testing. Fact Sheet for Patients: PinkCheek.be Fact Sheet for Healthcare Providers: GravelBags.it This test is not yet approved or cleared by the [place] FDA and  has been authorized for detection and/or diagnosis of SARS-CoV-2 by  FDA under an Emergency Use Authorization (EUA). This EUA will remain  in effect (meaning this test can be used) for the duration of the  Covid-19 declaration under Section 564(b)(1) of the Act, 21  U.S.C. section 360bbb-3(b)(1), unless the authorization is  terminated or revoked. Performed at [facility], [place] [address] [place], [place] [address]      RN Pressure Injury Documentation:     Estimated body mass index is 24.83 kg/m as calculated from the following:   Height as of this encounter: 5\' 7"  (1.702 m).   Weight as of this encounter: 71.9 kg.  Malnutrition Type:      Malnutrition Characteristics:      Nutrition Interventions:    Radiology Studies: DG CHEST PORT 1 VIEW  Result Date: 06/30/2019 CLINICAL DATA:  Shortness of breath. EXAM: PORTABLE CHEST 1 VIEW COMPARISON:  June 29, 2019 FINDINGS: Cardiomegaly and mild pulmonary edema are stable. Probable small effusion on the left. No other  interval changes. IMPRESSION: Stable cardiomegaly, small left effusion, and pulmonary edema. Electronically Signed   By: Dorise Bullion III M.D   On: 06/30/2019 11:19   DG CHEST PORT 1 VIEW  Result Date: 06/29/2019 CLINICAL DATA:  86 year old male with history of shortness of breath. EXAM: PORTABLE CHEST 1 VIEW COMPARISON:  Chest x-ray 06/28/2019. FINDINGS: Lung volumes are low. Small left pleural effusion. Bibasilar opacities favored to reflect areas of subsegmental atelectasis. No definite consolidative airspace disease. Cephalization of the pulmonary vasculature with some indistinct interstitial markings, indicative of mild interstitial pulmonary edema. Moderate cardiomegaly. Upper mediastinal contours are distorted by patient's rotation to the right. Aortic atherosclerosis. IMPRESSION: 1. The appearance of the chest suggests mild congestive heart failure, similar to the prior study, as above. 2. Aortic atherosclerosis. Electronically Signed   By: Vinnie Langton M.D.   On: 06/29/2019 07:52   Scheduled Meds: . amLODipine  5 mg Oral QPM  . apixaban  2.5 mg Oral BID  . diclofenac Sodium  2 g Topical QID  . doxazosin  2  mg Oral Daily  . guaiFENesin  1,200 mg Oral BID  . ipratropium  0.5 mg Nebulization TID  . latanoprost  1 drop Both Eyes QHS  . levalbuterol  0.63 mg Nebulization TID  . metoprolol succinate  25 mg Oral Daily  . polyvinyl alcohol  1 drop Both Eyes QHS  . potassium chloride  20 mEq Oral Daily  . vitamin B-12  500 mcg Oral q morning - 10a   Continuous Infusions:   LOS: 4 days   Kerney Elbe, DO [facility] PAGER is on [place]  If 7PM-7AM, please contact night-coverage [email or web address]

## 2019-07-01 ENCOUNTER — Inpatient Hospital Stay (HOSPITAL_COMMUNITY): Payer: Medicare Other

## 2019-07-01 DIAGNOSIS — N189 Chronic kidney disease, unspecified: Secondary | ICD-10-CM

## 2019-07-01 DIAGNOSIS — I5033 Acute on chronic diastolic (congestive) heart failure: Secondary | ICD-10-CM

## 2019-07-01 DIAGNOSIS — I482 Chronic atrial fibrillation, unspecified: Secondary | ICD-10-CM

## 2019-07-01 LAB — COMPREHENSIVE METABOLIC PANEL
ALT: 18 U/L (ref 0–44)
AST: 22 U/L (ref 15–41)
Albumin: 2.9 g/dL — ABNORMAL LOW (ref 3.5–5.0)
Alkaline Phosphatase: 98 U/L (ref 38–126)
Anion gap: 10 (ref 5–15)
BUN: 56 mg/dL — ABNORMAL HIGH (ref 8–23)
CO2: 29 mmol/L (ref 22–32)
Calcium: 8.7 mg/dL — ABNORMAL LOW (ref 8.9–10.3)
Chloride: 94 mmol/L — ABNORMAL LOW (ref 98–111)
Creatinine, Ser: 2.15 mg/dL — ABNORMAL HIGH (ref 0.61–1.24)
GFR calc Af Amer: 29 mL/min — ABNORMAL LOW (ref >60)
GFR calc non Af Amer: 25 mL/min — ABNORMAL LOW (ref >60)
Glucose, Bld: 117 mg/dL — ABNORMAL HIGH (ref 70–99)
Potassium: 3.7 mmol/L (ref 3.5–5.1)
Sodium: 133 mmol/L — ABNORMAL LOW (ref 135–145)
Total Bilirubin: 1.4 mg/dL — ABNORMAL HIGH (ref 0.3–1.2)
Total Protein: 6.6 g/dL (ref 6.5–8.1)

## 2019-07-01 LAB — CBC WITH DIFFERENTIAL/PLATELET
Abs Immature Granulocytes: 0.07 10*3/uL (ref 0.00–0.07)
Basophils Absolute: 0.1 10*3/uL (ref 0.0–0.1)
Basophils Relative: 0 %
Eosinophils Absolute: 0.2 10*3/uL (ref 0.0–0.5)
Eosinophils Relative: 2 %
HCT: 28.2 % — ABNORMAL LOW (ref 39.0–52.0)
Hemoglobin: 8.9 g/dL — ABNORMAL LOW (ref 13.0–17.0)
Immature Granulocytes: 1 %
Lymphocytes Relative: 9 %
Lymphs Abs: 1.2 10*3/uL (ref 0.7–4.0)
MCH: 30 pg (ref 26.0–34.0)
MCHC: 31.6 g/dL (ref 30.0–36.0)
MCV: 94.9 fL (ref 80.0–100.0)
Monocytes Absolute: 2 10*3/uL — ABNORMAL HIGH (ref 0.1–1.0)
Monocytes Relative: 15 %
Neutro Abs: 9.9 10*3/uL — ABNORMAL HIGH (ref 1.7–7.7)
Neutrophils Relative %: 73 %
Platelets: 269 10*3/uL (ref 150–400)
RBC: 2.97 MIL/uL — ABNORMAL LOW (ref 4.22–5.81)
RDW: 13.7 % (ref 11.5–15.5)
WBC: 13.3 10*3/uL — ABNORMAL HIGH (ref 4.0–10.5)
nRBC: 0 % (ref 0.0–0.2)

## 2019-07-01 LAB — MAGNESIUM: Magnesium: 1.9 mg/dL (ref 1.7–2.4)

## 2019-07-01 LAB — PHOSPHORUS: Phosphorus: 4 mg/dL (ref 2.5–4.6)

## 2019-07-01 MED ORDER — MAGNESIUM SULFATE 2 GM/50ML IV SOLN: 2.0000 g | Freq: Once | INTRAVENOUS | Status: DC

## 2019-07-01 MED ORDER — POLYSACCHARIDE IRON COMPLEX 150 MG PO CAPS
150.0000 mg | ORAL_CAPSULE | Freq: Every day | ORAL | Status: DC
  Administered 2019-07-01 – 2019-07-07 (×6): 150 mg via ORAL
  Filled 2019-07-01 (×7): qty 1

## 2019-07-01 NOTE — Plan of Care (Signed)
  Problem: Education: Goal: Knowledge of General Education information will improve Description: Including pain rating scale, medication(s)/side effects and non-pharmacologic comfort measures Outcome: Progressing   Problem: Clinical Measurements: Goal: Respiratory complications will improve Outcome: Progressing Goal: Cardiovascular complication will be avoided Outcome: Progressing   Problem: Activity: Goal: Risk for activity intolerance will decrease Outcome: Progressing   Problem: Nutrition: Goal: Adequate nutrition will be maintained Outcome: Progressing

## 2019-07-01 NOTE — Progress Notes (Signed)
PROGRESS NOTE    Cody Mendoza  [id number] DOB: [birth year] DOA: 06/26/2019 PCP: Crist Infante, MD   Brief Narrative:  The patient is a elderly generally bedbound 86 year old Caucasian male with a past medical history significant for but not limited to atrial fibrillation, hypertension, mitral valve insufficiency, GERD, history of [name] syndrome, history of TIA.  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  He is admitted for acute diastolic CHF and was placed on IV Lasix 40 mg every 12.  Lower extremity duplex was done and showed DVT in the lower extremities bilaterally.  On admission his BNP was elevated to 165.6.  ECHOCARDIOGRAM is to be repeated and showed an EF of 55 to 60% with no wall motion abnormalities and mildly reduced right ventricular systolic function.  Left ventricular diastolic parameters were indeterminate.  Subsequently the patient had an AKI after diuresis and his ARB so this was been held.  Cardiology is consulted for further evaluation recommendations and appreciate their assistance in management.  Cardiology recommended Lasix after his worsening Renal Fxn but they have resumed it now and he received a dose of IV Lasix yesterday and will be getting another dose of IV Lasix today.  His breathing is improved but he still complained of some diarrhea and I discussed the case with infectious disease and Dr. Graylon Good who recommends holding his antibiotic of doxycycline and she currently does not feel the patient has a high suspicion for C. difficile.  07/01/2019 Diarrhea is slowing down and improving.  Doxycycline has been stopped yesterday.  Leukocytosis is slightly worse.  Renal function is also worsened in the setting of his IV diuresis and will be held today.  Cardiology recommended pain  continue to monitor his respiratory status.  Assessment & Plan:   Principal Problem:   Acute CHF (congestive heart failure) (HCC) Active Problems:   Hypertension   Atrial fibrillation (HCC)   Discitis of thoracic region   CKD (chronic kidney disease) stage 2, GFR 60-89 ml/min   TIA (transient ischemic attack)  Acute on chronic diastolic CHF and right-sided heart failure, improving -last EF measured in 2020 was 60 to 65%.  -BNP on Admission was 165.6 and presented with lower extremity swelling as well as scrotal swelling -Initially was placed Lasix 40 mg IV every 12h after being given 1 dose of 40 mg of Lasix in the ED but had held it given AKI; and has been intermittently getting it and had it held after the first day but then resumed and was given it 2 times after and is now being held given his creatinine -closely follow intake output metabolic panel -Patient is -6.240 liters since Admission and weight is down 11 pounds since admission -Carvedilol 3.125 mg po BID changed to metoprolol succinate 25 mg daily by cardiology; Losartan 50 mg po BID now held due AKI and will consider resuming in the AM but will continue to hold for now -CXR this AM is showing "Cardiomegaly without failure." -Check 2D echo showed an EF of 55 to 60% with indeterminate left ventricular diastolic parameters and mildly reduced right-sided systolic dysfunction -Given that patient is bedbound will check Dopplers of the lower extremity to rule out DVT and this was Negative  -Cardiology suspect the patient has been getting more salt in his diet since moving to this facility.  They also feel that he needs Lasix in outpatient setting -Continue to monitor signs and symptoms of volume overload  -Patient is essentially bedbound and does not ambulate very much and is usually sits in his wheelchair.  Not requiring any more supplemental oxygen.  Dyspena/Wheezing, improved -from Above -Never Smoked -continue with  Diuresis -Scheduled Xopenex/Atrovent -Repeat CXR in the a.m.; chest x-ray from today as above -Add Flutter Valve, Incentive Spirometer, and Guaifenesin -Patient's wheezing is improved  Chronic A. Fib  -Patient is on Carvedilol 3.125 mg po BID and cardiology is changing this to Toprol for more beta-2 selectivity -Changed Apixaban 5 mg po BID back to 2.5 mg po BID given Worsened renal Fxn -Continue to monitor on telemetry carefully.  Hypertension  -C/w Carvedilol 3.125 mg po BID, Amlodipine 5 mg po Daily; losartan 50 mg p.o. twice daily has been held   -Continue doxazosin 2 mg p.o. daily  T8-T9 Discitis  -for which patient on chronic Doxycycline therapy being followed by Dr. Graylon Good infectious disease; I spoke with Dr. Graylon Good who recommends discontinuing doxycycline given the patient diarrhea and she feels that this is more antibiotic mediated and does not clinically suspect C. difficile given the patient being afebrile with a very minimal white count -Doxycycline has been held and his diarrhea is improving  Spinal Stenosis  -Bedbound being followed by neurosurgeon Dr. Kathyrn Sheriff. -Get PT/OT to further evaluate and treat  Chronic Normocytic Anemia -Paitient's Hgb/Hct went from 9.0/28.5 -> 9.0/28.4 -> 8.3/26.4 -> 8.5/26.7 -> 9.0/28.3 -> 8.9/28.2 -On B12 supplements with cyanocobalamin 500 mcg every morning orally -Check Anemia Panel; it showed an iron level of 22, U IBC of 171, TIBC 193, saturation ratios of 11%, ferritin level of 281, folate level of 29.4, and vitamin B12 898 -Continue to Monitor for S/Sx of Bleeding; Currently no overt bleeding noted -Repeat CBC in AM   Leukocytosis, slightly worsening -Mild and Likely Reactive and mildly worsening -Currently Afebrile with a TMax of 99.5 in the last 24 hours -Already on antibiotics with chronic doxycycline for his T8/T9 infection and discitis but will stop this at the recommendations of infectious diseases -WBC has gone from 10.2  -> 12.1 -> 11.0 -> 12.6 -> 13.3 -Continue to Monitor for S/Sx of Infection -Repeat CBC in AM   Hypokalemia -In the Setting of Diuresis  -K+ now 3.7 -Continue to Monitor and Replete as Necessary -Repeat CMP in AM   Hypomagnesemia -Patient magnesium level this morning was 1.3 and improved to 1.9 -Continue to monitor replete as necessary -Repeat magnesium level in a.m.  AKI on CKD Stage 3, worsened  -Patient's BUNs/creatinine from 35/1.14 and worsened to 51/1.78 today in the setting of diuresis and his ARB He was given another dose of IV Lasix and it has further worsened to 56/2.15 today -Holding losartan 50 mg p.o. twice daily but lasix has has been resumed by cardiology -Avoid nephrotoxic medications, contrast dyes, hypotension and renally dose medications -Continue to monitor and trend renal function carefully -Repeat CMP in the a.m.  Hyperbilirubinemia -Mild with a total bilirubin of 1.3 and has now slightly worsened to 1.7 and is now down to 1.4 -Continue monitor and trend and repeat CMP in a.m.  Diarrhea, improving  -Mild; patient is on chronic doxycycline and I spoke with infectious diseases who recommends holding doxycycline to see if this is the cause of the patient's diarrhea; He states diarrhea is slowing down -Continue to monitor carefully as he is afebrile and does have a slight leukocytosis and went  from 11.0 to 12.6 -Not on any stool softeners or any laxatives -If continues to worsen will obtain a GI pathogen panel and possibly C. difficile PCR given that has been on chronic doxycycline spoke with Dr. Graylon Good who recommends against testing for C. difficile currently and recommends holding the doxycycline to see if the diarrhea improves which it is  Hyponatremia -Mild low sodium 133 -In the setting of diuresis -Continue to monitor and trend and repeat CMP in a.m.  Confusion -Likely metabolic in the setting of his worsening renal function -Was earlier this morning but  when I examined him he was awake and alert and oriented. -Continue monitor carefully and will place on delirium precautions  Right shoulder pain -We will start Voltaren gel -Continue to monitor and if necessary will obtain x-ray  DVT prophylaxis: Anticoagulated with Apixaban Code Status: DO NOT RESUSCITATE  Family Communication: Discussed with Son at Bedside today Disposition Plan: Pending clearance by cardiology given the patient is from an ALF and needs to be diuresed back to back dry weight  Status is: Inpatient  Remains inpatient appropriate because:Altered mental status, IV treatments appropriate due to intensity of illness or inability to take PO and Inpatient level of care appropriate due to severity of illness   Dispo: The patient is from: ALF              Anticipated d/c is to: ALF              Anticipated d/c date is: 1 day              Patient currently is not medically stable to d/c.  Consultants:   Cardiology   Discussed the case with infectious diseases Dr. Graylon Good   Procedures:  ECHOCARDIOGRAM IMPRESSIONS    1. Left ventricular ejection fraction, by estimation, is 55 to 60%. The  left ventricle has normal function. The left ventricle has no regional  wall motion abnormalities. Left ventricular diastolic parameters are  indeterminate.  2. Right ventricular systolic function is mildly reduced. The right  ventricular size is normal. There is mildly elevated pulmonary artery  systolic pressure. The estimated right ventricular systolic pressure is  [id number] mmHg.  3. Left atrial size was moderately dilated.  4. Right atrial size was mildly dilated.  5. The mitral valve is normal in structure. Trivial mitral valve  regurgitation. No evidence of mitral stenosis.  6. The aortic valve is tricuspid. Aortic valve regurgitation is not  visualized. Mild aortic valve sclerosis is present, with no evidence of  aortic valve stenosis.  7. The inferior vena cava is  dilated in size with >50% respiratory  variability, suggesting right atrial pressure of 8 mmHg.  8. The patient is in atrial fibrillation.   FINDINGS  Left Ventricle: Left ventricular ejection fraction, by estimation, is 55  to 60%. The left ventricle has normal function. The left ventricle has no  regional wall motion abnormalities. The left ventricular internal cavity  size was normal in size. There is  no left ventricular hypertrophy. Left ventricular diastolic parameters  are indeterminate.   Right Ventricle: The right ventricular size is normal. No increase in  right ventricular wall thickness. Right ventricular systolic function is  mildly reduced. There is mildly elevated pulmonary artery systolic  pressure. The tricuspid regurgitant velocity  is 2.43 m/s, and with an assumed right atrial pressure of 8 mmHg, the  estimated right ventricular systolic pressure is [id number] mmHg.   Left Atrium: Left atrial size was moderately dilated.  Right Atrium: Right atrial size was mildly dilated.   Pericardium: Trivial pericardial effusion is present.   Mitral Valve: The mitral valve is normal in structure. Trivial mitral  valve regurgitation. No evidence of mitral valve stenosis.   Tricuspid Valve: The tricuspid valve is normal in structure. Tricuspid  valve regurgitation is trivial.   Aortic Valve: The aortic valve is tricuspid. Aortic valve regurgitation is  not visualized. Mild aortic valve sclerosis is present, with no evidence  of aortic valve stenosis.   Pulmonic Valve: The pulmonic valve was normal in structure. Pulmonic valve  regurgitation is not visualized.   Aorta: The aortic root is normal in size and structure.   Venous: The inferior vena cava is dilated in size with greater than 50%  respiratory variability, suggesting right atrial pressure of 8 mmHg.   IAS/Shunts: No atrial level shunt detected by color flow Doppler.     LEFT VENTRICLE  PLAX 2D  LVIDd:      5.00 cm  LVIDs:     3.63 cm  LV PW:     0.92 cm  LV IVS:    0.96 cm  LVOT diam:   2.00 cm  LV SV:     64  LV SV Index:  36  LVOT Area:   3.14 cm     RIGHT VENTRICLE  RV S prime:   6.85 cm/s   LEFT ATRIUM       Index    RIGHT ATRIUM      Index  LA diam:    4.30 cm 2.44 cm/m RA Area:   21.70 cm  LA Vol (A2C):  98.0 ml 55.55 ml/m RA Volume:  60.50 ml 34.29 ml/m  LA Vol (A4C):  63.0 ml 35.71 ml/m  LA Biplane Vol: 82.2 ml 46.59 ml/m  AORTIC VALVE  LVOT Vmax:  111.93 cm/s  LVOT Vmean: 79.000 cm/s  LVOT VTI:  0.202 m    AORTA  Ao Root diam: 3.10 cm   MITRAL VALVE        TRICUSPID VALVE  MV Area (PHT): 3.48 cm   TR Peak grad:  23.6 mmHg  MV Decel Time: 218 msec   TR Vmax:    243.00 cm/s  MV E velocity: 108.00 cm/s  MV A velocity: 51.40 cm/s  SHUNTS  MV E/A ratio: 2.10     Systemic VTI: 0.20 m               Systemic Diam: 2.00 cm   Antimicrobials:  Anti-infectives (From admission, onward)   Start     Dose/Rate Route Frequency Ordered Stop   06/27/19 1000  doxycycline (VIBRA-TABS) tablet 100 mg  Status:  Discontinued     100 mg Oral Every 12 hours 06/27/19 0400 06/30/19 1144     Subjective: Seen and examined at bedside and is a little confused this morning but when I saw him he is awake and alert.  Thinks his diarrhea is slowing down now.  No nausea or vomiting.  Denies any lightheadedness or dizziness.  Thinks his breathing is much improved.  No longer wheezing.  No other concerns or complaints at this time.  Objective: Vitals:   07/01/19 0504 07/01/19 0906 07/01/19 1416 07/01/19 1443  BP: (!) 131/54  139/71   Pulse: 82  84   Resp:   16   Temp: 98.3 F (36.8 C)  98.4 F (36.9 C)   TempSrc: Oral  Oral   SpO2: 94% 95% 95% 95%  Weight:  Height:        Intake/Output Summary (Last 24 hours) at 07/01/2019 1822 Last data filed at 07/01/2019 1754 Gross per 24 hour   Intake 330 ml  Output 1400 ml  Net -1070 ml   Filed Weights   06/29/19 0637 06/30/19 0500 07/01/19 0500  Weight: 74.1 kg 71.9 kg 72.4 kg   Examination: Physical Exam:  Constitutional: WN/WD overweight Caucasian male currently no acute distress and has no breathing complaints.  He appears comfortable but a little confused this morning but not on my examination. Eyes: Lids and conjunctivae normal, sclerae anicteric  ENMT: External Ears, Nose appear normal. Somewhat hard of hearing.  Neck: Appears normal, supple, no cervical masses, normal ROM, no appreciable thyromegaly; no JVD Respiratory: Slightly diminished to auscultation bilaterally, no wheezing, rales, rhonchi or crackles. Normal respiratory effort and patient is not tachypenic. No accessory muscle use.  Unlabored breathing Cardiovascular: Irregularly irregular, no murmurs / rubs / gallops. S1 and S2 auscultated.  Has scant lower extremity edema Abdomen: Soft, non-tender, slight distention secondary to body habitus.  Bowel sounds positive.  GU: Deferred. Musculoskeletal: No clubbing / cyanosis of digits/nails. No joint deformity upper and lower extremities.  Skin: No rashes, lesions, ulcers on limited skin evaluation. No induration; Warm and dry.  Neurologic: CN 2-12 grossly intact with no focal deficits. Romberg sign and cerebellar reflexes not assessed.  Psychiatric: Normal judgment and insight. Alert and oriented x 3. Pleasant mood and appropriate affect.   Data Reviewed: I have personally reviewed following labs and imaging studies  CBC: Recent Labs  Lab 06/26/19 2203 06/26/19 2203 06/27/19 0542 06/28/19 0417 06/29/19 0433 06/30/19 0522 07/01/19 0521  WBC 11.2*   < > 10.2 12.1* 11.0* 12.6* 13.3*  NEUTROABS 7.5  --   --  9.0* 7.1 8.9* 9.9*  HGB 9.0*   < > 9.0* 8.3* 8.5* 9.0* 8.9*  HCT 28.5*   < > 28.4* 26.4* 26.7* 28.3* 28.2*  MCV 96.3   < > 95.6 95.7 95.0 94.6 94.9  PLT 259   < > 258 235 237 248 269   < > = values  in this interval not displayed.   Basic Metabolic Panel: Recent Labs  Lab 06/27/19 0542 06/28/19 0417 06/29/19 0433 06/30/19 0522 07/01/19 0521  NA 138 138 138 136 133*  K 4.8 3.4* 4.0 3.8 3.7  CL 105 97* 98 96* 94*  CO2 25 28 29 31 29   GLUCOSE 87 104* 116* 118* 117*  BUN 35* 38* 51* 50* 56*  CREATININE 1.14 1.62* 1.78* 1.78* 2.15*  CALCIUM 8.7* 8.2* 8.4* 8.6* 8.7*  MG  --  1.3* 1.9 1.7 1.9  PHOS  --  3.9 3.8 4.0 4.0   GFR: Estimated Creatinine Clearance: 19.2 mL/min (A) (by C-G formula based on SCr of 2.15 mg/dL (H)). Liver Function Tests: Recent Labs  Lab 06/26/19 2203 06/28/19 0417 06/29/19 0433 06/30/19 0522 07/01/19 0521  AST 26 23 21 21 22   ALT 23 17 16 18 18   ALKPHOS 127* 99 98 107 98  BILITOT 0.8 1.3* 1.4* 1.7* 1.4*  PROT 6.8 6.1* 6.2* 6.7 6.6  ALBUMIN 3.3* 2.9* 2.9* 3.0* 2.9*   No results for input(s): LIPASE, AMYLASE in the last 168 hours. No results for input(s): AMMONIA in the last 168 hours. Coagulation Profile: No results for input(s): INR, PROTIME in the last 168 hours. Cardiac Enzymes: No results for input(s): CKTOTAL, CKMB, CKMBINDEX, TROPONINI in the last 168 hours. BNP (last 3 results) No results for input(s): PROBNP  in the last 8760 hours. HbA1C: No results for input(s): HGBA1C in the last 72 hours. CBG: No results for input(s): GLUCAP in the last 168 hours. Lipid Profile: No results for input(s): CHOL, HDL, LDLCALC, TRIG, CHOLHDL, LDLDIRECT in the last 72 hours. Thyroid Function Tests: No results for input(s): TSH, T4TOTAL, FREET4, T3FREE, THYROIDAB in the last 72 hours. Anemia Panel: No results for input(s): VITAMINB12, FOLATE, FERRITIN, TIBC, IRON, RETICCTPCT in the last 72 hours. Sepsis Labs: No results for input(s): PROCALCITON, LATICACIDVEN in the last 168 hours.  Recent Results (from the past 240 hour(s))  Respiratory Panel by RT PCR (Flu A&B, Covid) - Nasopharyngeal Swab     Status: None   Collection Time: 06/27/19 12:37 AM    Specimen: Nasopharyngeal Swab  Result Value Ref Range Status   SARS Coronavirus 2 by RT PCR NEGATIVE NEGATIVE Final    Comment: (NOTE) SARS-CoV-2 target nucleic acids are NOT DETECTED. The SARS-CoV-2 RNA is generally detectable in upper respiratoy specimens during the acute phase of infection. The lowest concentration of SARS-CoV-2 viral copies this assay can detect is 131 copies/mL. A negative result does not preclude SARS-Cov-2 infection and should not be used as the sole basis for treatment or other patient management decisions. A negative result may occur with  improper specimen collection/handling, submission of specimen other than nasopharyngeal swab, presence of viral mutation(s) within the areas targeted by this assay, and inadequate number of viral copies (<131 copies/mL). A negative result must be combined with clinical observations, patient history, and epidemiological information. The expected result is Negative. Fact Sheet for Patients:  PinkCheek.be Fact Sheet for Healthcare Providers:  GravelBags.it This test is not yet ap proved or cleared by the [place] FDA and  has been authorized for detection and/or diagnosis of SARS-CoV-2 by FDA under an Emergency Use Authorization (EUA). This EUA will remain  in effect (meaning this test can be used) for the duration of the COVID-19 declaration under Section 564(b)(1) of the Act, 21 U.S.C. section 360bbb-3(b)(1), unless the authorization is terminated or revoked sooner.    Influenza A by PCR NEGATIVE NEGATIVE Final   Influenza B by PCR NEGATIVE NEGATIVE Final    Comment: (NOTE) The Xpert Xpress SARS-CoV-2/FLU/RSV assay is intended as an aid in  the diagnosis of influenza from Nasopharyngeal swab specimens and  should not be used as a sole basis for treatment. Nasal washings and  aspirates are unacceptable for Xpert Xpress SARS-CoV-2/FLU/RSV  testing. Fact Sheet  for Patients: PinkCheek.be Fact Sheet for Healthcare Providers: GravelBags.it This test is not yet approved or cleared by the [place] FDA and  has been authorized for detection and/or diagnosis of SARS-CoV-2 by  FDA under an Emergency Use Authorization (EUA). This EUA will remain  in effect (meaning this test can be used) for the duration of the  Covid-19 declaration under Section 564(b)(1) of the Act, 21  U.S.C. section 360bbb-3(b)(1), unless the authorization is  terminated or revoked. Performed at [facility], [place] [address] [place], [place] [address]      RN Pressure Injury Documentation:     Estimated body mass index is 25 kg/m as calculated from the following:   Height as of this encounter: 5\' 7"  (1.702 m).   Weight as of this encounter: 72.4 kg.  Malnutrition Type:      Malnutrition Characteristics:      Nutrition Interventions:    Radiology Studies: DG CHEST PORT 1 VIEW  Result Date: 07/01/2019 CLINICAL DATA:  Shortness of  breath EXAM: PORTABLE CHEST 1 VIEW COMPARISON:  Yesterday FINDINGS: Cardiomegaly and vascular pedicle widening. Mild prominence of lung markings at the bases is unchanged over multiple studies, chronic appearing. No edema, effusion, or pneumothorax. IMPRESSION: Cardiomegaly without failure. Electronically Signed   By: Monte Fantasia M.D.   On: 07/01/2019 07:02   DG CHEST PORT 1 VIEW  Result Date: 06/30/2019 CLINICAL DATA:  Shortness of breath. EXAM: PORTABLE CHEST 1 VIEW COMPARISON:  June 29, 2019 FINDINGS: Cardiomegaly and mild pulmonary edema are stable. Probable small effusion on the left. No other interval changes. IMPRESSION: Stable cardiomegaly, small left effusion, and pulmonary edema. Electronically Signed   By: Dorise Bullion III M.D   On: 06/30/2019 11:19   Scheduled Meds: . amLODipine  5 mg Oral QPM  . apixaban  2.5 mg Oral BID  . diclofenac  Sodium  2 g Topical QID  . doxazosin  2 mg Oral Daily  . guaiFENesin  1,200 mg Oral BID  . ipratropium  0.5 mg Nebulization TID  . iron polysaccharides  150 mg Oral Daily  . latanoprost  1 drop Both Eyes QHS  . levalbuterol  0.63 mg Nebulization TID  . metoprolol succinate  25 mg Oral Daily  . polyvinyl alcohol  1 drop Both Eyes QHS  . potassium chloride  20 mEq Oral Daily  . vitamin B-12  500 mcg Oral q morning - 10a   Continuous Infusions:   LOS: 5 days   Kerney Elbe, DO [facility] PAGER is on [place]  If 7PM-7AM, please contact night-coverage [email or web address]

## 2019-07-01 NOTE — Care Management Important Message (Signed)
Important Message  Patient Details IM Letter given to [name] RN Case Manager to present to the Patient Name: Cody Mendoza MRN: [id number] Date of Birth: August 09, 1921   Medicare Important Message Given:  Yes     [name] 07/01/2019, 12:23 PM

## 2019-07-01 NOTE — Progress Notes (Signed)
Progress Note  Patient Name: Cody Mendoza Date of Encounter: 07/01/2019  Primary Cardiologist:   Cristopher Peru, MD   Subjective   Very confused.  No acute distress.  Says he is breathing OK.   Inpatient Medications    Scheduled Meds: . amLODipine  5 mg Oral QPM  . apixaban  2.5 mg Oral BID  . diclofenac Sodium  2 g Topical QID  . doxazosin  2 mg Oral Daily  . guaiFENesin  1,200 mg Oral BID  . ipratropium  0.5 mg Nebulization TID  . iron polysaccharides  150 mg Oral Daily  . latanoprost  1 drop Both Eyes QHS  . levalbuterol  0.63 mg Nebulization TID  . metoprolol succinate  25 mg Oral Daily  . polyvinyl alcohol  1 drop Both Eyes QHS  . potassium chloride  20 mEq Oral Daily  . vitamin B-12  500 mcg Oral q morning - 10a   Continuous Infusions:  PRN Meds: acetaminophen, ipratropium, levalbuterol, ondansetron **OR** ondansetron (ZOFRAN) IV   Vital Signs    Vitals:   06/30/19 2033 07/01/19 0500 07/01/19 0504 07/01/19 0906  BP: 131/63  (!) 131/54   Pulse: 73  82   Resp: 16     Temp: 98.1 F (36.7 C)  98.3 F (36.8 C)   TempSrc: Oral  Oral   SpO2:   94% 95%  Weight:  72.4 kg    Height:        Intake/Output Summary (Last 24 hours) at 07/01/2019 1215 Last data filed at 07/01/2019 0500 Gross per 24 hour  Intake 210 ml  Output 1450 ml  Net -1240 ml   Filed Weights   06/29/19 0637 06/30/19 0500 07/01/19 0500  Weight: 74.1 kg 71.9 kg 72.4 kg    Telemetry    Atrial fib with rate controlled.   - Personally Reviewed  ECG    NA  - Personally Reviewed  Physical Exam   GEN: No acute distress.   Neck: No  JVD Cardiac: Irregular RR, no murmurs, rubs, or gallops.  Respiratory:    Diffuse expiratory wheezing GI: Soft, nontender, non-distended  MS: No  edema; No deformity. Neuro:  Nonfocal  Psych:    Confused.   Labs    Chemistry Recent Labs  Lab 06/29/19 0433 06/30/19 0522 07/01/19 0521  NA 138 136 133*  K 4.0 3.8 3.7  CL 98 96* 94*  CO2 29 31  29   GLUCOSE 116* 118* 117*  BUN 51* 50* 56*  CREATININE 1.78* 1.78* 2.15*  CALCIUM 8.4* 8.6* 8.7*  PROT 6.2* 6.7 6.6  ALBUMIN 2.9* 3.0* 2.9*  AST 21 21 22   ALT 16 18 18   ALKPHOS 98 107 98  BILITOT 1.4* 1.7* 1.4*  GFRNONAA 32* 32* 25*  GFRAA 37* 37* 29*  ANIONGAP 11 9 10      Hematology Recent Labs  Lab 06/29/19 0433 06/30/19 0522 07/01/19 0521  WBC 11.0* 12.6* 13.3*  RBC 2.81* 2.99* 2.97*  HGB 8.5* 9.0* 8.9*  HCT 26.7* 28.3* 28.2*  MCV 95.0 94.6 94.9  MCH 30.2 30.1 30.0  MCHC 31.8 31.8 31.6  RDW 14.2 13.9 13.7  PLT 237 248 269    Cardiac EnzymesNo results for input(s): TROPONINI in the last 168 hours. No results for input(s): TROPIPOC in the last 168 hours.   BNP Recent Labs  Lab 06/26/19 2203  BNP 165.6*     DDimer No results for input(s): DDIMER in the last 168 hours.   Radiology  DG CHEST PORT 1 VIEW  Result Date: 07/01/2019 CLINICAL DATA:  Shortness of breath EXAM: PORTABLE CHEST 1 VIEW COMPARISON:  Yesterday FINDINGS: Cardiomegaly and vascular pedicle widening. Mild prominence of lung markings at the bases is unchanged over multiple studies, chronic appearing. No edema, effusion, or pneumothorax. IMPRESSION: Cardiomegaly without failure. Electronically Signed   By: Monte Fantasia M.D.   On: 07/01/2019 07:02   DG CHEST PORT 1 VIEW  Result Date: 06/30/2019 CLINICAL DATA:  Shortness of breath. EXAM: PORTABLE CHEST 1 VIEW COMPARISON:  June 29, 2019 FINDINGS: Cardiomegaly and mild pulmonary edema are stable. Probable small effusion on the left. No other interval changes. IMPRESSION: Stable cardiomegaly, small left effusion, and pulmonary edema. Electronically Signed   By: Dorise Bullion III M.D   On: 06/30/2019 11:19    Cardiac Studies   ATRIAL FIB  1. Left ventricular ejection fraction, by estimation, is 55 to 60%. The  left ventricle has normal function. The left ventricle has no regional  wall motion abnormalities. Left ventricular diastolic  parameters are  indeterminate.  2. Right ventricular systolic function is mildly reduced. The right  ventricular size is normal. There is mildly elevated pulmonary artery  systolic pressure. The estimated right ventricular systolic pressure is  [id number] mmHg.  3. Left atrial size was moderately dilated.  4. Right atrial size was mildly dilated.  5. The mitral valve is normal in structure. Trivial mitral valve  regurgitation. No evidence of mitral stenosis.  6. The aortic valve is tricuspid. Aortic valve regurgitation is not  visualized. Mild aortic valve sclerosis is present, with no evidence of  aortic valve stenosis.  7. The inferior vena cava is dilated in size with >50% respiratory  variability, suggesting right atrial pressure of 8 mmHg.  8. The patient is in atrial fibrillation.   Patient Profile     86 y.o. male with acute on chronic diastolic HF, CKD and atrial fib.    Assessment & Plan    PERSISTENT ATRIAL FIB:  On Eliquis and Coreg.  Dose adjusted downward.     ACUTE ON CHRONIC DIASTOLIC HF:   Net negative 6 liters this admission.    Weight appears to be down 10 lbs since admission. No change in therapy.  Certainly does not seem to volume overloaded.   CKD IV:  Creat jumped up today.  Have to hold diuretic.   He did get one dose yesterday.      For questions or updates, please contact [place] Please consult [email or web address] for contact info under Cardiology/STEMI.   Signed, Minus Breeding, MD  07/01/2019, 12:15 PM

## 2019-07-02 ENCOUNTER — Encounter (HOSPITAL_COMMUNITY): Payer: Self-pay | Admitting: Internal Medicine

## 2019-07-02 ENCOUNTER — Inpatient Hospital Stay (HOSPITAL_COMMUNITY): Payer: Medicare Other

## 2019-07-02 DIAGNOSIS — G9340 Encephalopathy, unspecified: Secondary | ICD-10-CM

## 2019-07-02 LAB — CBC WITH DIFFERENTIAL/PLATELET
Abs Immature Granulocytes: 0.09 10*3/uL — ABNORMAL HIGH (ref 0.00–0.07)
Basophils Absolute: 0.1 10*3/uL (ref 0.0–0.1)
Basophils Relative: 0 %
Eosinophils Absolute: 0.3 10*3/uL (ref 0.0–0.5)
Eosinophils Relative: 2 %
HCT: 27.3 % — ABNORMAL LOW (ref 39.0–52.0)
Hemoglobin: 8.7 g/dL — ABNORMAL LOW (ref 13.0–17.0)
Immature Granulocytes: 1 %
Lymphocytes Relative: 7 %
Lymphs Abs: 0.9 10*3/uL (ref 0.7–4.0)
MCH: 30.4 pg (ref 26.0–34.0)
MCHC: 31.9 g/dL (ref 30.0–36.0)
MCV: 95.5 fL (ref 80.0–100.0)
Monocytes Absolute: 1.9 10*3/uL — ABNORMAL HIGH (ref 0.1–1.0)
Monocytes Relative: 14 %
Neutro Abs: 10.6 10*3/uL — ABNORMAL HIGH (ref 1.7–7.7)
Neutrophils Relative %: 76 %
Platelets: 237 10*3/uL (ref 150–400)
RBC: 2.86 MIL/uL — ABNORMAL LOW (ref 4.22–5.81)
RDW: 13.5 % (ref 11.5–15.5)
WBC: 13.9 10*3/uL — ABNORMAL HIGH (ref 4.0–10.5)
nRBC: 0 % (ref 0.0–0.2)

## 2019-07-02 LAB — COMPREHENSIVE METABOLIC PANEL
ALT: 20 U/L (ref 0–44)
AST: 25 U/L (ref 15–41)
Albumin: 2.8 g/dL — ABNORMAL LOW (ref 3.5–5.0)
Alkaline Phosphatase: 102 U/L (ref 38–126)
Anion gap: 8 (ref 5–15)
BUN: 56 mg/dL — ABNORMAL HIGH (ref 8–23)
CO2: 28 mmol/L (ref 22–32)
Calcium: 8.5 mg/dL — ABNORMAL LOW (ref 8.9–10.3)
Chloride: 98 mmol/L (ref 98–111)
Creatinine, Ser: 1.73 mg/dL — ABNORMAL HIGH (ref 0.61–1.24)
GFR calc Af Amer: 38 mL/min — ABNORMAL LOW (ref >60)
GFR calc non Af Amer: 33 mL/min — ABNORMAL LOW (ref >60)
Glucose, Bld: 120 mg/dL — ABNORMAL HIGH (ref 70–99)
Potassium: 3.7 mmol/L (ref 3.5–5.1)
Sodium: 134 mmol/L — ABNORMAL LOW (ref 135–145)
Total Bilirubin: 1.6 mg/dL — ABNORMAL HIGH (ref 0.3–1.2)
Total Protein: 6.5 g/dL (ref 6.5–8.1)

## 2019-07-02 LAB — URINALYSIS, ROUTINE W REFLEX MICROSCOPIC
Bacteria, UA: NONE SEEN
Bilirubin Urine: NEGATIVE
Glucose, UA: NEGATIVE mg/dL
Ketones, ur: NEGATIVE mg/dL
Leukocytes,Ua: NEGATIVE
Nitrite: NEGATIVE
Protein, ur: NEGATIVE mg/dL
Specific Gravity, Urine: 1.019 (ref 1.005–1.030)
pH: 5 (ref 5.0–8.0)

## 2019-07-02 LAB — PHOSPHORUS: Phosphorus: 3.8 mg/dL (ref 2.5–4.6)

## 2019-07-02 LAB — MAGNESIUM: Magnesium: 2 mg/dL (ref 1.7–2.4)

## 2019-07-02 LAB — TSH: TSH: 5.281 u[IU]/mL — ABNORMAL HIGH (ref 0.350–4.500)

## 2019-07-02 MED ORDER — SODIUM CHLORIDE 0.9 % IV SOLN: INTRAVENOUS | Status: DC

## 2019-07-02 NOTE — Progress Notes (Signed)
PROGRESS NOTE    Cody Mendoza  [id number] DOB: Apr 18, 1921 DOA: 06/26/2019 PCP: Crist Infante, MD   Brief Narrative:  The patient is a elderly generally bedbound 86 year old Caucasian male with a past medical history significant for but not limited to atrial fibrillation, hypertension, mitral valve insufficiency, GERD, history of [name] syndrome, history of TIA.  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  He is admitted for acute diastolic CHF and was placed on IV Lasix 40 mg every 12.  Lower extremity duplex was done and showed DVT in the lower extremities bilaterally.  On admission his BNP was elevated to 165.6.  ECHOCARDIOGRAM is to be repeated and showed an EF of 55 to 60% with no wall motion abnormalities and mildly reduced right ventricular systolic function.  Left ventricular diastolic parameters were indeterminate.  Subsequently the patient had an AKI after diuresis and his ARB so this was been held.  Cardiology is consulted for further evaluation recommendations and appreciate their assistance in management.  Cardiology recommended Lasix after his worsening Renal Fxn but they have resumed it now and he received a dose of IV Lasix yesterday and will be getting another dose of IV Lasix today.  His breathing is improved but he still complained of some diarrhea and I discussed the case with infectious disease and Dr. Graylon Good who recommends holding his antibiotic of doxycycline and she currently does not feel the patient has a high suspicion for C. difficile.  07/01/2019 Diarrhea is slowing down and improving.  Doxycycline has been stopped yesterday.  Leukocytosis is slightly worse.  Renal function is also worsened in the setting of his IV diuresis and will be held today.  Cardiology recommended pain  continue to monitor his respiratory status.  07/02/2019 Today the patient was extremely confused and started slurring some of his words.  He had intermittent coherent speech and did answer some questions appropriately but then started going off on tangents and started mumbling and became incomprehensible.  He had no focal deficits so I do not suspect a stroke however head CT was done and did not show anything acute.  We will check a urinalysis and urine culture as well as a TSH.  Likely could be metabolic as his renal function is still elevated versus delirium as he has periods of lucidity.   Assessment & Plan:   Principal Problem:   Acute CHF (congestive heart failure) (HCC) Active Problems:   Hypertension   Atrial fibrillation (HCC)   Discitis of thoracic region   CKD (chronic kidney disease) stage 2, GFR 60-89 ml/min   TIA (transient ischemic attack)  Acute on chronic diastolic CHF and right-sided heart failure, improving -last EF measured in 2020 was 60 to 65%.  -BNP on Admission was 165.6 and presented with lower extremity swelling as well as scrotal swelling -Initially was placed Lasix 40 mg IV every 12h after being given 1 dose of 40 mg of Lasix in the ED but had held it given AKI; and has been intermittently getting it and had it held after the first day but then resumed and was given it 2 times after and is now being held given his creatinine and has not been given the last 2 days -closely follow intake output metabolic panel -Patient is -6. 565 liters since Admission and weight is down  11 pounds since admission -Carvedilol 3.125 mg po BID changed to metoprolol succinate 25 mg daily by cardiology; Losartan 50 mg po BID now held due AKI and will consider resuming in the AM but will continue to hold for now -CXR this AM is showing "Progression of vascular congestion and mild interstitial edema on the right." -Check 2D echo showed an EF of 55 to 60% with indeterminate left ventricular  diastolic parameters and mildly reduced right-sided systolic dysfunction -Given that patient is bedbound will check Dopplers of the lower extremity to rule out DVT and this was Negative  -Cardiology suspect the patient has been getting more salt in his diet since moving to this facility.  They also feel that he needs Lasix in outpatient setting -Continue to monitor signs and symptoms of volume overload and he has very mild lower extremity edema and some diminished breath sounds and crackles on the right -Patient is essentially bedbound and does not ambulate very much and is usually sits in his wheelchair.  Not requiring any more supplemental oxygen.  Dyspena/Wheezing, improving  -from Above -Never Smoked -continued with Diuresis but this has been held -Scheduled Xopenex/Atrovent -Repeat CXR in the a.m.; chest x-ray from today as above -Add Flutter Valve, Incentive Spirometer, and Guaifenesin -Patient's wheezing is improved but apparently he is wheezing last night  Chronic A. Fib  -Patient is on Carvedilol 3.125 mg po BID and cardiology is changing this to Toprol for more beta-2 selectivity -Changed Apixaban 5 mg po BID back to 2.5 mg po BID given Worsened renal Fxn -Continue to monitor on telemetry carefully.  Hypertension  -C/w Carvedilol 3.125 mg po BID, Amlodipine 5 mg po Daily; losartan 50 mg p.o. twice daily has been held   -Continue doxazosin 2 mg p.o. daily but may need to be held  T8-T9 Discitis  -for which patient on chronic Doxycycline therapy being followed by Dr. Graylon Good infectious disease; I spoke with Dr. Graylon Good who recommends discontinuing doxycycline given the patient diarrhea and she feels that this is more antibiotic mediated and does not clinically suspect C. difficile given the patient being afebrile with a very minimal white count -Doxycycline has been held and his diarrhea is improving and almost resolved  Spinal Stenosis  -Bedbound being followed by neurosurgeon  Dr. Kathyrn Sheriff. -Get PT/OT to further evaluate and treat  Chronic Normocytic Anemia -Paitient's Hgb/Hct went from 9.0/28.5 -> 9.0/28.4 -> 8.3/26.4 -> 8.5/26.7 -> 9.0/28.3 -> 8.9/28.2 -> 8.7/27.3 -On B12 supplements with cyanocobalamin 500 mcg every morning orally -Check Anemia Panel; it showed an iron level of 22, U IBC of 171, TIBC 193, saturation ratios of 11%, ferritin level of 281, folate level of 29.4, and vitamin B12 898 -Continue to Monitor for S/Sx of Bleeding; Currently no overt bleeding noted -Repeat CBC in AM   Leukocytosis, slightly worsening again -Mild and Likely Reactive and mildly worsening still -Currently Afebrile with a TMax of 99.5 in the last 24 hours -Already on antibiotics with chronic doxycycline for his T8/T9 infection and discitis but will stop this at the recommendations of infectious diseases this was stopped on 06/30/2019 -WBC has gone from 10.2 -> 12.1 -> 11.0 -> 12.6 -> 13.3 -> 13.9 -Chest x-ray as above -Check urinalysis and urine culture as well as blood cultures x2 -Continue to Monitor for S/Sx of Infection -Repeat CBC in AM   Hypokalemia -In the Setting of Diuresis  -K+ now 3.7 and stable -Continue to Monitor and Replete as Necessary -Repeat CMP in AM   Hypomagnesemia -  Patient magnesium level this morning was 1.3 and improved to 2.0 -Continue to monitor replete as necessary -Repeat magnesium level in a.m.  AKI on CKD Stage 3, worsened  -Patient's BUNs/creatinine from 35/1.14 and worsened to 51/1.78 today in the setting of diuresis and his ARB He was given another dose of IV Lasix and it has further worsened to 56/2.15 yesterday and is slightly improved today at 56/1.73 -Holding losartan 50 mg p.o. twice daily but lasix has has been resumed by cardiology -Avoid nephrotoxic medications, contrast dyes, hypotension and renally dose medications -Continue to monitor and trend renal function carefully -Repeat CMP in the a.m.  Hyperbilirubinemia -Mild  with a total bilirubin of is now on up again to 1.6 -Continue monitor and trend and repeat CMP in a.m.  Diarrhea, improving  -Mild; patient is on chronic doxycycline and I spoke with infectious diseases who recommends holding doxycycline to see if this is the cause of the patient's diarrhea; He states diarrhea is slowing down -Continue to monitor carefully as he is afebrile and does have a slight leukocytosis and went from 11.0 to 12.6 -Not on any stool softeners or any laxatives -If continues to worsen will obtain a GI pathogen panel and possibly C. difficile PCR given that has been on chronic doxycycline spoke with Dr. Graylon Good who recommends against testing for C. difficile currently and recommends holding the doxycycline to see if the diarrhea improves which it is  Hyponatremia -Mild low sodium 134 -In the setting of diuresis -Continue to monitor and trend and repeat CMP in a.m.  Confusion and Encephalopathy  -Likely metabolic in the setting of his worsening renal function but acutely has worsened today; I woke him from his sleep and he was a little confused and then became more confused and only is oriented to his name and birthday; knew who his son was but he becomes incomprehensible on some of the questions and slurs some of his speech but then has periods where he talks normally without any slurring -Was lucid earlier yesterday morning but when I examined him he was awake and alert and oriented.  Today he is only oriented to himself and he answers some questions appropriately but then he starts slurring some of his words but other words are very coherent.  He has no focal deficits and no facial droop so do not suspect stroke will still obtain a CT of his head -Check TSH -Check blood cultures x2 as well as urinalysis and urine culture given his elevated leukocytosis -Placed on delirium precautions -If he is still not improving consider MRI of the brain and obtain a neurology  consult -Continue monitor carefully and he has periods of lucidity and he answers some of the questions very coherently and then some questions he is very incomprehensible and tangential and does not make any sense  Right shoulder pain -We will start Voltaren gel -Continue to monitor and if necessary will obtain x-ray  DVT prophylaxis: Anticoagulated with Apixaban Code Status: DO NOT RESUSCITATE  Family Communication: Discussed with Son at Bedside today Disposition Plan: Pending clearance by cardiology given the patient is from an ALF and needs to be diuresed back to back dry weight  Status is: Inpatient  Remains inpatient appropriate because:Altered mental status, IV treatments appropriate due to intensity of illness or inability to take PO and Inpatient level of care appropriate due to severity of illness   Dispo: The patient is from: ALF  Anticipated d/c is to: ALF              Anticipated d/c date is: 2 days               Patient currently is not medically stable to d/c.  Consultants:   Cardiology   Discussed the case with infectious diseases Dr. Baxter Flattery   Procedures:  ECHOCARDIOGRAM IMPRESSIONS    1. Left ventricular ejection fraction, by estimation, is 55 to 60%. The  left ventricle has normal function. The left ventricle has no regional  wall motion abnormalities. Left ventricular diastolic parameters are  indeterminate.  2. Right ventricular systolic function is mildly reduced. The right  ventricular size is normal. There is mildly elevated pulmonary artery  systolic pressure. The estimated right ventricular systolic pressure is  [id number] mmHg.  3. Left atrial size was moderately dilated.  4. Right atrial size was mildly dilated.  5. The mitral valve is normal in structure. Trivial mitral valve  regurgitation. No evidence of mitral stenosis.  6. The aortic valve is tricuspid. Aortic valve regurgitation is not  visualized. Mild aortic valve sclerosis  is present, with no evidence of  aortic valve stenosis.  7. The inferior vena cava is dilated in size with >50% respiratory  variability, suggesting right atrial pressure of 8 mmHg.  8. The patient is in atrial fibrillation.   FINDINGS  Left Ventricle: Left ventricular ejection fraction, by estimation, is 55  to 60%. The left ventricle has normal function. The left ventricle has no  regional wall motion abnormalities. The left ventricular internal cavity  size was normal in size. There is  no left ventricular hypertrophy. Left ventricular diastolic parameters  are indeterminate.   Right Ventricle: The right ventricular size is normal. No increase in  right ventricular wall thickness. Right ventricular systolic function is  mildly reduced. There is mildly elevated pulmonary artery systolic  pressure. The tricuspid regurgitant velocity  is 2.43 m/s, and with an assumed right atrial pressure of 8 mmHg, the  estimated right ventricular systolic pressure is [id number] mmHg.   Left Atrium: Left atrial size was moderately dilated.   Right Atrium: Right atrial size was mildly dilated.   Pericardium: Trivial pericardial effusion is present.   Mitral Valve: The mitral valve is normal in structure. Trivial mitral  valve regurgitation. No evidence of mitral valve stenosis.   Tricuspid Valve: The tricuspid valve is normal in structure. Tricuspid  valve regurgitation is trivial.   Aortic Valve: The aortic valve is tricuspid. Aortic valve regurgitation is  not visualized. Mild aortic valve sclerosis is present, with no evidence  of aortic valve stenosis.   Pulmonic Valve: The pulmonic valve was normal in structure. Pulmonic valve  regurgitation is not visualized.   Aorta: The aortic root is normal in size and structure.   Venous: The inferior vena cava is dilated in size with greater than 50%  respiratory variability, suggesting right atrial pressure of 8 mmHg.   IAS/Shunts: No atrial  level shunt detected by color flow Doppler.     LEFT VENTRICLE  PLAX 2D  LVIDd:     5.00 cm  LVIDs:     3.63 cm  LV PW:     0.92 cm  LV IVS:    0.96 cm  LVOT diam:   2.00 cm  LV SV:     64  LV SV Index:  36  LVOT Area:   3.14 cm     RIGHT VENTRICLE  RV S prime:  6.85 cm/s   LEFT ATRIUM       Index    RIGHT ATRIUM      Index  LA diam:    4.30 cm 2.44 cm/m RA Area:   21.70 cm  LA Vol (A2C):  98.0 ml 55.55 ml/m RA Volume:  60.50 ml 34.29 ml/m  LA Vol (A4C):  63.0 ml 35.71 ml/m  LA Biplane Vol: 82.2 ml 46.59 ml/m  AORTIC VALVE  LVOT Vmax:  111.93 cm/s  LVOT Vmean: 79.000 cm/s  LVOT VTI:  0.202 m    AORTA  Ao Root diam: 3.10 cm   MITRAL VALVE        TRICUSPID VALVE  MV Area (PHT): 3.48 cm   TR Peak grad:  23.6 mmHg  MV Decel Time: 218 msec   TR Vmax:    243.00 cm/s  MV E velocity: 108.00 cm/s  MV A velocity: 51.40 cm/s  SHUNTS  MV E/A ratio: 2.10     Systemic VTI: 0.20 m               Systemic Diam: 2.00 cm   Antimicrobials:  Anti-infectives (From admission, onward)   Start     Dose/Rate Route Frequency Ordered Stop   06/27/19 1000  doxycycline (VIBRA-TABS) tablet 100 mg  Status:  Discontinued     100 mg Oral Every 12 hours 06/27/19 0400 06/30/19 1144     Subjective: Seen and examined at bedside and was more confused and encephalopathic when I woke him up.  He is only oriented to himself and his birthday.  Knows who his son is.  Denies any diarrhea and states that it improved.  Denies any pain currently.  No other concerns or close at this time but son is also concerned because he has acutely worsened since yesterday.  Objective: Vitals:   07/02/19 0500 07/02/19 0845 07/02/19 0850 07/02/19 1309  BP:    124/65  Pulse:    73  Resp:    16  Temp:    98.6 F (37 C)  TempSrc:    Oral  SpO2:  98% 98% 97%  Weight: 72.7 kg     Height:        Intake/Output  Summary (Last 24 hours) at 07/02/2019 1856 Last data filed at 07/02/2019 1700 Gross per 24 hour  Intake 300 ml  Output 625 ml  Net -325 ml   Filed Weights   06/30/19 0500 07/01/19 0500 07/02/19 0500  Weight: 71.9 kg 72.4 kg 72.7 kg   Examination: Physical Exam:  Constitutional: WN/WD, who is in no acute distress but is extremely confused this morning and encephalopathic and only oriented to himself and slurring some of his words with periods of being coherent Eyes: Lids and conjunctivae normal, sclerae anicteric  ENMT: External Ears, Nose appear normal. Grossly normal hearing.  Neck: Appears normal, supple, no cervical masses, normal ROM, no appreciable thyromegaly; no JVD Respiratory: Diminished to auscultation bilaterally with slightly coarse breath sounds more so on the right compared to left, no wheezing, rales, rhonchi or crackles. Normal respiratory effort and patient is not tachypenic. No accessory muscle use.  Unlabored breathing Cardiovascular: RRR, no murmurs / rubs / gallops. S1 and S2 auscultated.  Minimal to 1+ extremity edema.  Abdomen: Soft, non-tender, distended secondary to body habitus. Bowel sounds positive.  GU: Deferred. Musculoskeletal: No clubbing / cyanosis of digits/nails. No joint deformity upper and lower extremities.  Skin: No rashes, lesions, ulcers on limited skin evaluation. No induration; Warm and dry.  Neurologic: Patient has equal strength in the upper extremities.  He is extremely weak in the lower extremity.  At times he is slurring some of his words and becomes incomprehensible tangential Psychiatric: Normal judgment and insight. Alert and oriented x 1.  A little frustrated mood and appropriate affect.   Data Reviewed: I have personally reviewed following labs and imaging studies  CBC: Recent Labs  Lab 06/28/19 0417 06/29/19 0433 06/30/19 0522 07/01/19 0521 07/02/19 0510  WBC 12.1* 11.0* 12.6* 13.3* 13.9*  NEUTROABS 9.0* 7.1 8.9* 9.9* 10.6*   HGB 8.3* 8.5* 9.0* 8.9* 8.7*  HCT 26.4* 26.7* 28.3* 28.2* 27.3*  MCV 95.7 95.0 94.6 94.9 95.5  PLT 235 237 248 269 [id number]   Basic Metabolic Panel: Recent Labs  Lab 06/28/19 0417 06/29/19 0433 06/30/19 0522 07/01/19 0521 07/02/19 0510  NA 138 138 136 133* 134*  K 3.4* 4.0 3.8 3.7 3.7  CL 97* 98 96* 94* 98  CO2 28 29 31 29 28   GLUCOSE 104* 116* 118* 117* 120*  BUN 38* 51* 50* 56* 56*  CREATININE 1.62* 1.78* 1.78* 2.15* 1.73*  CALCIUM 8.2* 8.4* 8.6* 8.7* 8.5*  MG 1.3* 1.9 1.7 1.9 2.0  PHOS 3.9 3.8 4.0 4.0 3.8   GFR: Estimated Creatinine Clearance: 23.9 mL/min (A) (by C-G formula based on SCr of 1.73 mg/dL (H)). Liver Function Tests: Recent Labs  Lab 06/28/19 0417 06/29/19 0433 06/30/19 0522 07/01/19 0521 07/02/19 0510  AST 23 21 21 22 25   ALT 17 16 18 18 20   ALKPHOS 99 98 107 98 102  BILITOT 1.3* 1.4* 1.7* 1.4* 1.6*  PROT 6.1* 6.2* 6.7 6.6 6.5  ALBUMIN 2.9* 2.9* 3.0* 2.9* 2.8*   No results for input(s): LIPASE, AMYLASE in the last 168 hours. No results for input(s): AMMONIA in the last 168 hours. Coagulation Profile: No results for input(s): INR, PROTIME in the last 168 hours. Cardiac Enzymes: No results for input(s): CKTOTAL, CKMB, CKMBINDEX, TROPONINI in the last 168 hours. BNP (last 3 results) No results for input(s): PROBNP in the last 8760 hours. HbA1C: No results for input(s): HGBA1C in the last 72 hours. CBG: No results for input(s): GLUCAP in the last 168 hours. Lipid Profile: No results for input(s): CHOL, HDL, LDLCALC, TRIG, CHOLHDL, LDLDIRECT in the last 72 hours. Thyroid Function Tests: Recent Labs    07/02/19 1041  TSH 5.281*   Anemia Panel: No results for input(s): VITAMINB12, FOLATE, FERRITIN, TIBC, IRON, RETICCTPCT in the last 72 hours. Sepsis Labs: No results for input(s): PROCALCITON, LATICACIDVEN in the last 168 hours.  Recent Results (from the past 240 hour(s))  Respiratory Panel by RT PCR (Flu A&B, Covid) - Nasopharyngeal Swab      Status: None   Collection Time: 06/27/19 12:37 AM   Specimen: Nasopharyngeal Swab  Result Value Ref Range Status   SARS Coronavirus 2 by RT PCR NEGATIVE NEGATIVE Final    Comment: (NOTE) SARS-CoV-2 target nucleic acids are NOT DETECTED. The SARS-CoV-2 RNA is generally detectable in upper respiratoy specimens during the acute phase of infection. The lowest concentration of SARS-CoV-2 viral copies this assay can detect is 131 copies/mL. A negative result does not preclude SARS-Cov-2 infection and should not be used as the sole basis for treatment or other patient management decisions. A negative result may occur with  improper specimen collection/handling, submission of specimen other than nasopharyngeal swab, presence of viral mutation(s) within the areas targeted by this assay, and inadequate number of viral copies (<131 copies/mL). A negative result must  be combined with clinical observations, patient history, and epidemiological information. The expected result is Negative. Fact Sheet for Patients:  PinkCheek.be Fact Sheet for Healthcare Providers:  GravelBags.it This test is not yet ap proved or cleared by the [place] FDA and  has been authorized for detection and/or diagnosis of SARS-CoV-2 by FDA under an Emergency Use Authorization (EUA). This EUA will remain  in effect (meaning this test can be used) for the duration of the COVID-19 declaration under Section 564(b)(1) of the Act, 21 U.S.C. section 360bbb-3(b)(1), unless the authorization is terminated or revoked sooner.    Influenza A by PCR NEGATIVE NEGATIVE Final   Influenza B by PCR NEGATIVE NEGATIVE Final    Comment: (NOTE) The Xpert Xpress SARS-CoV-2/FLU/RSV assay is intended as an aid in  the diagnosis of influenza from Nasopharyngeal swab specimens and  should not be used as a sole basis for treatment. Nasal washings and  aspirates are unacceptable for  Xpert Xpress SARS-CoV-2/FLU/RSV  testing. Fact Sheet for Patients: PinkCheek.be Fact Sheet for Healthcare Providers: GravelBags.it This test is not yet approved or cleared by the [place] FDA and  has been authorized for detection and/or diagnosis of SARS-CoV-2 by  FDA under an Emergency Use Authorization (EUA). This EUA will remain  in effect (meaning this test can be used) for the duration of the  Covid-19 declaration under Section 564(b)(1) of the Act, 21  U.S.C. section 360bbb-3(b)(1), unless the authorization is  terminated or revoked. Performed at [facility], [place] [address] [place], [place] [address]      RN Pressure Injury Documentation:     Estimated body mass index is 25.1 kg/m as calculated from the following:   Height as of this encounter: 5\' 7"  (1.702 m).   Weight as of this encounter: 72.7 kg.  Malnutrition Type:      Malnutrition Characteristics:      Nutrition Interventions:    Radiology Studies: CT HEAD WO CONTRAST  Result Date: 07/02/2019 CLINICAL DATA:  Subacute neuro deficit. EXAM: CT HEAD WITHOUT CONTRAST TECHNIQUE: Contiguous axial images were obtained from the base of the skull through the vertex without intravenous contrast. COMPARISON:  CT head 02/21/2019 FINDINGS: Brain: Image quality degraded by motion moderate motion. Moderate atrophy and moderate chronic microvascular ischemic change in the white matter. Negative for acute infarct, hemorrhage, mass Vascular: Negative for hyperdense vessel Skull: Negative Sinuses/Orbits: Negative Other: None IMPRESSION: This examination is significantly motion degraded No acute abnormality identified Moderate atrophy and moderate chronic microvascular ischemic changes in the white matter. Electronically Signed   By: Franchot Gallo M.D.   On: 07/02/2019 12:50   DG CHEST PORT 1 VIEW  Result Date: 07/02/2019 CLINICAL DATA:  Short  of breath EXAM: PORTABLE CHEST 1 VIEW COMPARISON:  07/01/2019 FINDINGS: Cardiac enlargement. Progression of mild vascular congestion in mild interstitial airspace disease on the right. No significant pleural effusion. Atherosclerotic aortic arch. IMPRESSION: Progression of vascular congestion and mild interstitial edema on the right. Electronically Signed   By: Franchot Gallo M.D.   On: 07/02/2019 09:13   DG CHEST PORT 1 VIEW  Result Date: 07/01/2019 CLINICAL DATA:  Shortness of breath EXAM: PORTABLE CHEST 1 VIEW COMPARISON:  Yesterday FINDINGS: Cardiomegaly and vascular pedicle widening. Mild prominence of lung markings at the bases is unchanged over multiple studies, chronic appearing. No edema, effusion, or pneumothorax. IMPRESSION: Cardiomegaly without failure. Electronically Signed   By: Monte Fantasia M.D.   On: 07/01/2019 07:02   Scheduled Meds: . amLODipine  5 mg Oral QPM  . apixaban  2.5 mg Oral BID  . diclofenac Sodium  2 g Topical QID  . doxazosin  2 mg Oral Daily  . guaiFENesin  1,200 mg Oral BID  . ipratropium  0.5 mg Nebulization TID  . iron polysaccharides  150 mg Oral Daily  . latanoprost  1 drop Both Eyes QHS  . levalbuterol  0.63 mg Nebulization TID  . metoprolol succinate  25 mg Oral Daily  . polyvinyl alcohol  1 drop Both Eyes QHS  . potassium chloride  20 mEq Oral Daily  . vitamin B-12  500 mcg Oral q morning - 10a   Continuous Infusions:   LOS: 6 days   Kerney Elbe, DO [facility] PAGER is on [place]  If 7PM-7AM, please contact night-coverage [email or web address]

## 2019-07-02 NOTE — Progress Notes (Signed)
Pt helped to reposition on his side in bed. Pt lung sounds wheezing and he is coughing intermittently, clearing throat like he has phlegm. O2 sat 94% on RA. Pt reports no SOB. RN offered breathing treatment and robitussin, but pt says he doesn't want them, he feels fine. Will continue to monitor and carry out plan of care.

## 2019-07-02 NOTE — Progress Notes (Signed)
This RN assumed care of pt. Agree with previous RN's assessment. Pt resting comfortably.

## 2019-07-03 ENCOUNTER — Inpatient Hospital Stay (HOSPITAL_COMMUNITY): Payer: Medicare Other

## 2019-07-03 LAB — COMPREHENSIVE METABOLIC PANEL
ALT: 24 U/L (ref 0–44)
AST: 29 U/L (ref 15–41)
Albumin: 2.8 g/dL — ABNORMAL LOW (ref 3.5–5.0)
Alkaline Phosphatase: 102 U/L (ref 38–126)
Anion gap: 9 (ref 5–15)
BUN: 55 mg/dL — ABNORMAL HIGH (ref 8–23)
CO2: 27 mmol/L (ref 22–32)
Calcium: 8.6 mg/dL — ABNORMAL LOW (ref 8.9–10.3)
Chloride: 100 mmol/L (ref 98–111)
Creatinine, Ser: 1.58 mg/dL — ABNORMAL HIGH (ref 0.61–1.24)
GFR calc Af Amer: 42 mL/min — ABNORMAL LOW (ref >60)
GFR calc non Af Amer: 37 mL/min — ABNORMAL LOW (ref >60)
Glucose, Bld: 115 mg/dL — ABNORMAL HIGH (ref 70–99)
Potassium: 3.9 mmol/L (ref 3.5–5.1)
Sodium: 136 mmol/L (ref 135–145)
Total Bilirubin: 1.2 mg/dL (ref 0.3–1.2)
Total Protein: 6.4 g/dL — ABNORMAL LOW (ref 6.5–8.1)

## 2019-07-03 LAB — CBC WITH DIFFERENTIAL/PLATELET
Abs Immature Granulocytes: 0.1 10*3/uL — ABNORMAL HIGH (ref 0.00–0.07)
Basophils Absolute: 0.1 10*3/uL (ref 0.0–0.1)
Basophils Relative: 0 %
Eosinophils Absolute: 0.2 10*3/uL (ref 0.0–0.5)
Eosinophils Relative: 2 %
HCT: 26.5 % — ABNORMAL LOW (ref 39.0–52.0)
Hemoglobin: 8.5 g/dL — ABNORMAL LOW (ref 13.0–17.0)
Immature Granulocytes: 1 %
Lymphocytes Relative: 10 %
Lymphs Abs: 1.2 10*3/uL (ref 0.7–4.0)
MCH: 30.6 pg (ref 26.0–34.0)
MCHC: 32.1 g/dL (ref 30.0–36.0)
MCV: 95.3 fL (ref 80.0–100.0)
Monocytes Absolute: 1.2 10*3/uL — ABNORMAL HIGH (ref 0.1–1.0)
Monocytes Relative: 10 %
Neutro Abs: 9.9 10*3/uL — ABNORMAL HIGH (ref 1.7–7.7)
Neutrophils Relative %: 77 %
Platelets: 266 10*3/uL (ref 150–400)
RBC: 2.78 MIL/uL — ABNORMAL LOW (ref 4.22–5.81)
RDW: 13.7 % (ref 11.5–15.5)
WBC: 12.7 10*3/uL — ABNORMAL HIGH (ref 4.0–10.5)
nRBC: 0 % (ref 0.0–0.2)

## 2019-07-03 LAB — MAGNESIUM: Magnesium: 1.9 mg/dL (ref 1.7–2.4)

## 2019-07-03 LAB — URINE CULTURE: Culture: <10000 — AB

## 2019-07-03 LAB — T4, FREE: Free T4: 1.32 ng/dL — ABNORMAL HIGH (ref 0.61–1.12)

## 2019-07-03 LAB — PHOSPHORUS: Phosphorus: 3.3 mg/dL (ref 2.5–4.6)

## 2019-07-03 NOTE — Progress Notes (Signed)
Progress Note  Patient Name: Cody Mendoza Date of Encounter: 07/03/2019  Primary Cardiologist:   Cristopher Peru, MD   Subjective   Awake and answering questions.  Inpatient Medications    Scheduled Meds: . amLODipine  5 mg Oral QPM  . apixaban  2.5 mg Oral BID  . diclofenac Sodium  2 g Topical QID  . doxazosin  2 mg Oral Daily  . guaiFENesin  1,200 mg Oral BID  . ipratropium  0.5 mg Nebulization TID  . iron polysaccharides  150 mg Oral Daily  . latanoprost  1 drop Both Eyes QHS  . levalbuterol  0.63 mg Nebulization TID  . metoprolol succinate  25 mg Oral Daily  . polyvinyl alcohol  1 drop Both Eyes QHS  . potassium chloride  20 mEq Oral Daily  . vitamin B-12  500 mcg Oral q morning - 10a   Continuous Infusions:  PRN Meds: acetaminophen, ipratropium, levalbuterol, ondansetron **OR** ondansetron (ZOFRAN) IV   Vital Signs    Vitals:   07/02/19 2038 07/03/19 0507 07/03/19 0558 07/03/19 1310  BP: (!) 111/51 130/70  (!) 120/58  Pulse: 72 84  75  Resp: 20 20  19   Temp: 99.9 F (37.7 C) 98.1 F (36.7 C)  99.1 F (37.3 C)  TempSrc: Oral Oral  Oral  SpO2: 95% 94% 96% 100%  Weight:  72.2 kg    Height:        Intake/Output Summary (Last 24 hours) at 07/03/2019 1408 Last data filed at 07/03/2019 0830 Gross per 24 hour  Intake 120 ml  Output 425 ml  Net -305 ml   Filed Weights   07/01/19 0500 07/02/19 0500 07/03/19 0507  Weight: 72.4 kg 72.7 kg 72.2 kg    Telemetry    Atrial fib with controlled rate   - Personally Reviewed  ECG    NA  - Personally Reviewed  Physical Exam   GEN: No  acute distress.   Neck: No  JVD Cardiac: Irregular RR, no murmurs, rubs, or gallops.  Respiratory:   Decreased air movement and diffuse wheezing GI: Soft, nontender, non-distended, normal bowel sounds  MS:  No edema; No deformity. Neuro:   Nonfocal  Psych: Mildly confused.     Labs    Chemistry Recent Labs  Lab 07/01/19 0521 07/02/19 0510 07/03/19 0457  NA  133* 134* 136  K 3.7 3.7 3.9  CL 94* 98 100  CO2 29 28 27   GLUCOSE 117* 120* 115*  BUN 56* 56* 55*  CREATININE 2.15* 1.73* 1.58*  CALCIUM 8.7* 8.5* 8.6*  PROT 6.6 6.5 6.4*  ALBUMIN 2.9* 2.8* 2.8*  AST 22 25 29   ALT 18 20 24   ALKPHOS 98 102 102  BILITOT 1.4* 1.6* 1.2  GFRNONAA 25* 33* 37*  GFRAA 29* 38* 42*  ANIONGAP 10 8 9      Hematology Recent Labs  Lab 07/01/19 0521 07/02/19 0510 07/03/19 0457  WBC 13.3* 13.9* 12.7*  RBC 2.97* 2.86* 2.78*  HGB 8.9* 8.7* 8.5*  HCT 28.2* 27.3* 26.5*  MCV 94.9 95.5 95.3  MCH 30.0 30.4 30.6  MCHC 31.6 31.9 32.1  RDW 13.7 13.5 13.7  PLT 269 237 266    Cardiac EnzymesNo results for input(s): TROPONINI in the last 168 hours. No results for input(s): TROPIPOC in the last 168 hours.   BNP Recent Labs  Lab 06/26/19 2203  BNP 165.6*     DDimer No results for input(s): DDIMER in the last 168 hours.   Radiology  CT HEAD WO CONTRAST  Result Date: 07/02/2019 CLINICAL DATA:  Subacute neuro deficit. EXAM: CT HEAD WITHOUT CONTRAST TECHNIQUE: Contiguous axial images were obtained from the base of the skull through the vertex without intravenous contrast. COMPARISON:  CT head 02/21/2019 FINDINGS: Brain: Image quality degraded by motion moderate motion. Moderate atrophy and moderate chronic microvascular ischemic change in the white matter. Negative for acute infarct, hemorrhage, mass Vascular: Negative for hyperdense vessel Skull: Negative Sinuses/Orbits: Negative Other: None IMPRESSION: This examination is significantly motion degraded No acute abnormality identified Moderate atrophy and moderate chronic microvascular ischemic changes in the white matter. Electronically Signed   By: Franchot Gallo M.D.   On: 07/02/2019 12:50   DG CHEST PORT 1 VIEW  Result Date: 07/03/2019 CLINICAL DATA:  Shortness of breath EXAM: PORTABLE CHEST 1 VIEW COMPARISON:  One day prior FINDINGS: Numerous leads and wires project over the chest. Midline trachea. Mild  cardiomegaly. Atherosclerosis in the transverse aorta. No pleural effusion or pneumothorax. Improved interstitial edema with mild pulmonary venous congestion remaining. Improved left base airspace disease. IMPRESSION: Improved aeration, improved to resolved interstitial edema. Mild pulmonary venous congestion remains. Improved left base Airspace disease, likely atelectasis. Electronically Signed   By: Abigail Miyamoto M.D.   On: 07/03/2019 09:08   DG CHEST PORT 1 VIEW  Result Date: 07/02/2019 CLINICAL DATA:  Short of breath EXAM: PORTABLE CHEST 1 VIEW COMPARISON:  07/01/2019 FINDINGS: Cardiac enlargement. Progression of mild vascular congestion in mild interstitial airspace disease on the right. No significant pleural effusion. Atherosclerotic aortic arch. IMPRESSION: Progression of vascular congestion and mild interstitial edema on the right. Electronically Signed   By: Franchot Gallo M.D.   On: 07/02/2019 09:13    Cardiac Studies   ATRIAL FIB  1. Left ventricular ejection fraction, by estimation, is 55 to 60%. The  left ventricle has normal function. The left ventricle has no regional  wall motion abnormalities. Left ventricular diastolic parameters are  indeterminate.  2. Right ventricular systolic function is mildly reduced. The right  ventricular size is normal. There is mildly elevated pulmonary artery  systolic pressure. The estimated right ventricular systolic pressure is  [id number] mmHg.  3. Left atrial size was moderately dilated.  4. Right atrial size was mildly dilated.  5. The mitral valve is normal in structure. Trivial mitral valve  regurgitation. No evidence of mitral stenosis.  6. The aortic valve is tricuspid. Aortic valve regurgitation is not  visualized. Mild aortic valve sclerosis is present, with no evidence of  aortic valve stenosis.  7. The inferior vena cava is dilated in size with >50% respiratory  variability, suggesting right atrial pressure of 8 mmHg.  8. The  patient is in atrial fibrillation.   Patient Profile     86 y.o. male with acute on chronic diastolic HF, CKD and atrial fib.    Assessment & Plan    PERSISTENT ATRIAL FIB:  On Eliquis and Coreg.  Dose adjusted downward.    ACUTE ON CHRONIC DIASTOLIC HF:   Net negative 6.7 liters this admission.    Creat is stable.    Lasix held for the last several days.   Does not seem to have excess volume.  No change in diuretic at this point.  I think when discharged it would be without standing diuretic dose but he would need to be followed by his PCP closely to dose adjust if needed.    CKD IV:  Creat is slowly coming down.      For  questions or updates, please contact [place] Please consult [email or web address] for contact info under Cardiology/STEMI.   Signed, Minus Breeding, MD  07/03/2019, 2:08 PM

## 2019-07-03 NOTE — Progress Notes (Signed)
PROGRESS NOTE    Cody Mendoza  [id number] DOB: 1921-11-03 DOA: 06/26/2019 PCP: Crist Infante, MD   Brief Narrative: Cody Mendoza is a 86 y.o. male with a history of atrial fibrillation, hypertension, mitral valve insufficiency, GERD, history of Gilbertsyndrome, history of TIA. Patient presented secondary to scrotal pain, leg swelling and dyspnea in the setting of acute heart failure. Patient started on IV lasix diuresis with improvement of symptoms.   Assessment & Plan:   Principal Problem:   Acute CHF (congestive heart failure) (HCC) Active Problems:   Hypertension   Atrial fibrillation (HCC)   Discitis of thoracic region   CKD (chronic kidney disease) stage 2, GFR 60-89 ml/min   TIA (transient ischemic attack)   Acute on chronic diastolic heart failure Right sided heart failure Patient started on IV diuresis. Current management per cardiology. Concern exacerbation is secondary to increased salt intake. 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  Chronic normocytic anemia Stable.  Leukocytosis Elevated and stable. No evidence of infection  Hypokalemia Replete as needed  Hypomagnesemia Replete as needed  AKI on CKD stage IIIa AKI secondary to diuresis. Improving with holding Lasix -BMP in AM  DVT prophylaxis: Eliquis Code Status:   Code Status: DNR Family Communication: None at bedside Disposition Plan: Discharge once creatinine is improved and per cardiology recommendations. Plan for discharge back to ALF pending PT  evaluation.   Consultants:   Cardiology  Procedures:   TRANSTHORACIC ECHOCARDIOGRAM (06/27/19) IMPRESSIONS    1. Left ventricular ejection fraction, by estimation, is 55 to 60%. The  left ventricle has normal function. The left ventricle has no regional  wall motion abnormalities. Left ventricular diastolic parameters are  indeterminate.  2. Right ventricular systolic function is mildly reduced. The right  ventricular size is normal. There is mildly elevated pulmonary artery  systolic pressure. The estimated right ventricular systolic pressure is  [id number] mmHg.  3. Left atrial size was moderately dilated.  4. Right atrial size was mildly dilated.  5. The mitral valve is normal in structure. Trivial mitral valve  regurgitation. No evidence of mitral stenosis.  6. The aortic valve is tricuspid. Aortic valve regurgitation is not  visualized. Mild aortic valve sclerosis is present, with no evidence of  aortic valve stenosis.  7. The inferior vena cava is dilated in size with >50% respiratory  variability, suggesting right atrial pressure of 8 mmHg.  8. The patient is in atrial fibrillation.   Antimicrobials:  None    Subjective: No issues overnight.  Objective: Vitals:   07/02/19 1923 07/02/19 2038 07/03/19 0507 07/03/19 0558  BP:  (!) 111/51 130/70   Pulse:  72 84   Resp:  20 20   Temp:  99.9 F (37.7 C) 98.1 F (36.7 C)   TempSrc:  Oral Oral   SpO2: 96% 95% 94% 96%  Weight:   72.2 kg   Height:        Intake/Output Summary (Last 24 hours) at 07/03/2019 1143 Last data filed at 07/03/2019 0830 Gross per 24 hour  Intake 360 ml  Output 425 ml  Net -65 ml   Filed Weights   07/01/19 0500  07/02/19 0500 07/03/19 0507  Weight: 72.4 kg 72.7 kg 72.2 kg    Examination:  General exam: Appears calm and comfortable Respiratory system: Clear to auscultation. Respiratory effort normal. Cardiovascular system: S1 & S2 heard, RRR. No murmurs, rubs, gallops or  clicks. Gastrointestinal system: Abdomen is nondistended, soft and nontender. No organomegaly or masses felt. Normal bowel sounds heard. Central nervous system: Alert and oriented. No focal neurological deficits. Extremities: No edema. No calf tenderness Skin: No cyanosis. No rashes Psychiatry: Judgement and insight appear normal. Mood & affect appropriate.     Data Reviewed: I have personally reviewed following labs and imaging studies  CBC: Recent Labs  Lab 06/29/19 0433 06/30/19 0522 07/01/19 0521 07/02/19 0510 07/03/19 0457  WBC 11.0* 12.6* 13.3* 13.9* 12.7*  NEUTROABS 7.1 8.9* 9.9* 10.6* 9.9*  HGB 8.5* 9.0* 8.9* 8.7* 8.5*  HCT 26.7* 28.3* 28.2* 27.3* 26.5*  MCV 95.0 94.6 94.9 95.5 95.3  PLT 237 248 269 237 [id number]   Basic Metabolic Panel: Recent Labs  Lab 06/29/19 0433 06/30/19 0522 07/01/19 0521 07/02/19 0510 07/03/19 0457  NA 138 136 133* 134* 136  K 4.0 3.8 3.7 3.7 3.9  CL 98 96* 94* 98 100  CO2 29 31 29 28 27   GLUCOSE 116* 118* 117* 120* 115*  BUN 51* 50* 56* 56* 55*  CREATININE 1.78* 1.78* 2.15* 1.73* 1.58*  CALCIUM 8.4* 8.6* 8.7* 8.5* 8.6*  MG 1.9 1.7 1.9 2.0 1.9  PHOS 3.8 4.0 4.0 3.8 3.3   GFR: Estimated Creatinine Clearance: 26.1 mL/min (A) (by C-G formula based on SCr of 1.58 mg/dL (H)). Liver Function Tests: Recent Labs  Lab 06/29/19 0433 06/30/19 0522 07/01/19 0521 07/02/19 0510 07/03/19 0457  AST 21 21 22 25 29   ALT 16 18 18 20 24   ALKPHOS 98 107 98 102 102  BILITOT 1.4* 1.7* 1.4* 1.6* 1.2  PROT 6.2* 6.7 6.6 6.5 6.4*  ALBUMIN 2.9* 3.0* 2.9* 2.8* 2.8*   No results for input(s): LIPASE, AMYLASE in the last 168 hours. No results for input(s): AMMONIA in the last 168 hours. Coagulation Profile: No results for input(s): INR, PROTIME in the last 168 hours. Cardiac Enzymes: No results for input(s): CKTOTAL, CKMB, CKMBINDEX, TROPONINI in the last 168 hours. BNP (last 3 results) No results for input(s): PROBNP in the last 8760  hours. HbA1C: No results for input(s): HGBA1C in the last 72 hours. CBG: No results for input(s): GLUCAP in the last 168 hours. Lipid Profile: No results for input(s): CHOL, HDL, LDLCALC, TRIG, CHOLHDL, LDLDIRECT in the last 72 hours. Thyroid Function Tests: Recent Labs    07/02/19 1041  TSH 5.281*   Anemia Panel: No results for input(s): VITAMINB12, FOLATE, FERRITIN, TIBC, IRON, RETICCTPCT in the last 72 hours. Sepsis Labs: No results for input(s): PROCALCITON, LATICACIDVEN in the last 168 hours.  Recent Results (from the past 240 hour(s))  Respiratory Panel by RT PCR (Flu A&B, Covid) - Nasopharyngeal Swab     Status: None   Collection Time: 06/27/19 12:37 AM   Specimen: Nasopharyngeal Swab  Result Value Ref Range Status   SARS Coronavirus 2 by RT PCR NEGATIVE NEGATIVE Final    Comment: (NOTE) SARS-CoV-2 target nucleic acids are NOT DETECTED. The SARS-CoV-2 RNA is generally detectable in upper respiratoy specimens during the acute phase of infection. The lowest concentration of SARS-CoV-2 viral copies this assay can detect is 131 copies/mL. A negative result does not preclude SARS-Cov-2 infection and should not be used as the sole basis for treatment or  other patient management decisions. A negative result may occur with  improper specimen collection/handling, submission of specimen other than nasopharyngeal swab, presence of viral mutation(s) within the areas targeted by this assay, and inadequate number of viral copies (<131 copies/mL). A negative result must be combined with clinical observations, patient history, and epidemiological information. The expected result is Negative. Fact Sheet for Patients:  PinkCheek.be Fact Sheet for Healthcare Providers:  GravelBags.it This test is not yet ap proved or cleared by the [place] FDA and  has been authorized for detection and/or diagnosis of SARS-CoV-2 by FDA  under an Emergency Use Authorization (EUA). This EUA will remain  in effect (meaning this test can be used) for the duration of the COVID-19 declaration under Section 564(b)(1) of the Act, 21 U.S.C. section 360bbb-3(b)(1), unless the authorization is terminated or revoked sooner.    Influenza A by PCR NEGATIVE NEGATIVE Final   Influenza B by PCR NEGATIVE NEGATIVE Final    Comment: (NOTE) The Xpert Xpress SARS-CoV-2/FLU/RSV assay is intended as an aid in  the diagnosis of influenza from Nasopharyngeal swab specimens and  should not be used as a sole basis for treatment. Nasal washings and  aspirates are unacceptable for Xpert Xpress SARS-CoV-2/FLU/RSV  testing. Fact Sheet for Patients: PinkCheek.be Fact Sheet for Healthcare Providers: GravelBags.it This test is not yet approved or cleared by the [place] FDA and  has been authorized for detection and/or diagnosis of SARS-CoV-2 by  FDA under an Emergency Use Authorization (EUA). This EUA will remain  in effect (meaning this test can be used) for the duration of the  Covid-19 declaration under Section 564(b)(1) of the Act, 21  U.S.C. section 360bbb-3(b)(1), unless the authorization is  terminated or revoked. Performed at [facility], [place] [address] [place], [place] [address]   Culture, blood (routine x 2)     Status: None (Preliminary result)   Collection Time: 07/02/19 10:39 AM   Specimen: BLOOD  Result Value Ref Range Status   Specimen Description   Final    BLOOD LEFT ARM Performed at [facility] [address] [place], [place] [address]    Special Requests   Final    BOTTLES DRAWN AEROBIC AND ANAEROBIC Blood Culture adequate volume Performed at [facility] [address], [place], [place] [address]    Culture   Final    NO GROWTH < 24 HOURS Performed at [facility] [address],  [place], [place] [address]    Report Status PENDING  Incomplete  Culture, blood (routine x 2)     Status: None (Preliminary result)   Collection Time: 07/02/19 10:41 AM   Specimen: BLOOD  Result Value Ref Range Status   Specimen Description   Final    BLOOD RIGHT ARM Performed at [facility] [address], [place], [place] [address]    Special Requests   Final    BOTTLES DRAWN AEROBIC AND ANAEROBIC Blood Culture adequate volume Performed at [facility] [address], [place], [place] [address]    Culture   Final    NO GROWTH < 24 HOURS Performed at [facility] [address], [place], [place] [address]    Report Status PENDING  Incomplete         Radiology Studies: CT HEAD WO CONTRAST  Result Date: 07/02/2019 CLINICAL DATA:  Subacute neuro deficit. EXAM: CT HEAD WITHOUT CONTRAST TECHNIQUE: Contiguous axial images were obtained from  the base of the skull through the vertex without intravenous contrast. COMPARISON:  CT head 02/21/2019 FINDINGS: Brain: Image quality degraded by motion moderate motion. Moderate atrophy and moderate chronic microvascular ischemic change in the white matter. Negative for acute infarct, hemorrhage, mass Vascular: Negative for hyperdense vessel Skull: Negative Sinuses/Orbits: Negative Other: None IMPRESSION: This examination is significantly motion degraded No acute abnormality identified Moderate atrophy and moderate chronic microvascular ischemic changes in the white matter. Electronically Signed   By: Franchot Gallo M.D.   On: 07/02/2019 12:50   DG CHEST PORT 1 VIEW  Result Date: 07/03/2019 CLINICAL DATA:  Shortness of breath EXAM: PORTABLE CHEST 1 VIEW COMPARISON:  One day prior FINDINGS: Numerous leads and wires project over the chest. Midline trachea. Mild cardiomegaly. Atherosclerosis in the transverse aorta. No pleural effusion or pneumothorax. Improved interstitial edema with mild pulmonary venous  congestion remaining. Improved left base airspace disease. IMPRESSION: Improved aeration, improved to resolved interstitial edema. Mild pulmonary venous congestion remains. Improved left base Airspace disease, likely atelectasis. Electronically Signed   By: Abigail Miyamoto M.D.   On: 07/03/2019 09:08   DG CHEST PORT 1 VIEW  Result Date: 07/02/2019 CLINICAL DATA:  Short of breath EXAM: PORTABLE CHEST 1 VIEW COMPARISON:  07/01/2019 FINDINGS: Cardiac enlargement. Progression of mild vascular congestion in mild interstitial airspace disease on the right. No significant pleural effusion. Atherosclerotic aortic arch. IMPRESSION: Progression of vascular congestion and mild interstitial edema on the right. Electronically Signed   By: Franchot Gallo M.D.   On: 07/02/2019 09:13        Scheduled Meds: . amLODipine  5 mg Oral QPM  . apixaban  2.5 mg Oral BID  . diclofenac Sodium  2 g Topical QID  . doxazosin  2 mg Oral Daily  . guaiFENesin  1,200 mg Oral BID  . ipratropium  0.5 mg Nebulization TID  . iron polysaccharides  150 mg Oral Daily  . latanoprost  1 drop Both Eyes QHS  . levalbuterol  0.63 mg Nebulization TID  . metoprolol succinate  25 mg Oral Daily  . polyvinyl alcohol  1 drop Both Eyes QHS  . potassium chloride  20 mEq Oral Daily  . vitamin B-12  500 mcg Oral q morning - 10a   Continuous Infusions:   LOS: 7 days     Cordelia Poche, MD [facility] 07/03/2019, 11:43 AM  If 7PM-7AM, please contact night-coverage [email or web address]

## 2019-07-03 NOTE — Plan of Care (Signed)
  Problem: Education: Goal: Knowledge of General Education information will improve Description: Including pain rating scale, medication(s)/side effects and non-pharmacologic comfort measures Outcome: Completed/Met   Problem: Health Behavior/Discharge Planning: Goal: Ability to manage health-related needs will improve Outcome: Progressing   Problem: Clinical Measurements: Goal: Ability to maintain clinical measurements within normal limits will improve Outcome: Progressing Goal: Will remain free from infection Outcome: Completed/Met Goal: Diagnostic test results will improve Outcome: Progressing Goal: Respiratory complications will improve Outcome: Progressing Goal: Cardiovascular complication will be avoided Outcome: Progressing   Problem: Activity: Goal: Risk for activity intolerance will decrease Outcome: Progressing   Problem: Nutrition: Goal: Adequate nutrition will be maintained Outcome: Progressing   Problem: Coping: Goal: Level of anxiety will decrease Outcome: Completed/Met   Problem: Elimination: Goal: Will not experience complications related to bowel motility Outcome: Completed/Met Goal: Will not experience complications related to urinary retention Outcome: Completed/Met   Problem: Pain Managment: Goal: General experience of comfort will improve Outcome: Completed/Met   Problem: Safety: Goal: Ability to remain free from injury will improve Outcome: Progressing   Problem: Skin Integrity: Goal: Risk for impaired skin integrity will decrease Outcome: Completed/Met   Problem: Education: Goal: Ability to demonstrate management of disease process will improve Outcome: Progressing Goal: Ability to verbalize understanding of medication therapies will improve Outcome: Progressing Goal: Individualized Educational Video(s) Outcome: Not Applicable   Problem: Activity: Goal: Capacity to carry out activities will improve Outcome: Progressing   Problem:  Cardiac: Goal: Ability to achieve and maintain adequate cardiopulmonary perfusion will improve Outcome: Progressing

## 2019-07-04 LAB — BASIC METABOLIC PANEL
Anion gap: 9 (ref 5–15)
BUN: 54 mg/dL — ABNORMAL HIGH (ref 8–23)
CO2: 25 mmol/L (ref 22–32)
Calcium: 8.5 mg/dL — ABNORMAL LOW (ref 8.9–10.3)
Chloride: 100 mmol/L (ref 98–111)
Creatinine, Ser: 1.62 mg/dL — ABNORMAL HIGH (ref 0.61–1.24)
GFR calc Af Amer: 41 mL/min — ABNORMAL LOW (ref >60)
GFR calc non Af Amer: 36 mL/min — ABNORMAL LOW (ref >60)
Glucose, Bld: 112 mg/dL — ABNORMAL HIGH (ref 70–99)
Potassium: 3.8 mmol/L (ref 3.5–5.1)
Sodium: 134 mmol/L — ABNORMAL LOW (ref 135–145)

## 2019-07-04 LAB — SARS CORONAVIRUS 2 (TAT 6-24 HRS): SARS Coronavirus 2: NEGATIVE

## 2019-07-04 LAB — T3, FREE: T3, Free: 1.6 pg/mL — ABNORMAL LOW (ref 2.0–4.4)

## 2019-07-04 NOTE — Progress Notes (Signed)
PROGRESS NOTE    Cody Mendoza  [id number] DOB: Feb 14, 1922 DOA: 06/26/2019 PCP: Crist Infante, MD   Brief Narrative: Cody Mendoza is a 86 y.o. male with a history of atrial fibrillation, hypertension, mitral valve insufficiency, GERD, history of Gilbertsyndrome, history of TIA. Patient presented secondary to scrotal pain, leg swelling and dyspnea in the setting of acute heart failure. Patient started on IV lasix diuresis with improvement of symptoms.   Assessment & Plan:   Principal Problem:   Acute CHF (congestive heart failure) (HCC) Active Problems:   Hypertension   Atrial fibrillation (HCC)   Discitis of thoracic region   CKD (chronic kidney disease) stage 2, GFR 60-89 ml/min   TIA (transient ischemic attack)   Acute on chronic diastolic heart failure Right sided heart failure Patient started on IV diuresis. Current management per cardiology. Concern exacerbation is secondary to increased salt intake. 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  Chronic normocytic anemia Stable.  Leukocytosis Elevated and stable. No evidence of infection  Hypokalemia Replete as needed  Hypomagnesemia Replete as needed  AKI on CKD stage IIIa AKI secondary to diuresis. Improving with holding Lasix initially with slight worsening today. -BMP in AM  Abnormal thyroid labs Difficult to interpret. Would suggest possible central hyperthyroidism. Will discuss with endocrinology.  DVT prophylaxis: Eliquis Code Status:   Code Status: DNR Family  Communication: Son at bedside Disposition Plan: Discharge to SNF if patient is agreeable as ILF will not take him back secondary to increased therapy needs. Medically stable for discharge   Consultants:   Cardiology  Procedures:   TRANSTHORACIC ECHOCARDIOGRAM (06/27/19) IMPRESSIONS    1. Left ventricular ejection fraction, by estimation, is 55 to 60%. The  left ventricle has normal function. The left ventricle has no regional  wall motion abnormalities. Left ventricular diastolic parameters are  indeterminate.  2. Right ventricular systolic function is mildly reduced. The right  ventricular size is normal. There is mildly elevated pulmonary artery  systolic pressure. The estimated right ventricular systolic pressure is  [id number] mmHg.  3. Left atrial size was moderately dilated.  4. Right atrial size was mildly dilated.  5. The mitral valve is normal in structure. Trivial mitral valve  regurgitation. No evidence of mitral stenosis.  6. The aortic valve is tricuspid. Aortic valve regurgitation is not  visualized. Mild aortic valve sclerosis is present, with no evidence of  aortic valve stenosis.  7. The inferior vena cava is dilated in size with >50% respiratory  variability, suggesting right atrial pressure of 8 mmHg.  8. The patient is in atrial fibrillation.   Antimicrobials:  None    Subjective: No concerns. Breathing well.  Objective: Vitals:   07/04/19 0452 07/04/19 0500 07/04/19 0859 07/04/19 1256  BP:  136/65  (!) 123/56  Pulse:  76  80  Resp:  (!) 22  20  Temp:  98.8 F (37.1 C)  99.3 F (37.4 C)  TempSrc:  Oral  Oral  SpO2:  94% 94% 96%  Weight: 72.4 kg     Height:        Intake/Output Summary (Last 24 hours) at 07/04/2019 1554 Last data  filed at 07/04/2019 1307 Gross per 24 hour  Intake 660 ml  Output 750 ml  Net -90 ml   Filed Weights   07/02/19 0500 07/03/19 0507 07/04/19 0452  Weight: 72.7 kg 72.2 kg 72.4 kg    Examination:  General  exam: Appears calm and comfortable  Respiratory system: Clear to auscultation. Respiratory effort normal. Cardiovascular system: S1 & S2 heard, RRR. Gastrointestinal system: Abdomen is nondistended, soft and nontender. No organomegaly or masses felt. Normal bowel sounds heard. Central nervous system: Alert and oriented. No focal neurological deficits. Extremities:  No calf tenderness Skin: No cyanosis. No rashes Psychiatry: Judgement and insight appear normal. Mood & affect appropriate.     Data Reviewed: I have personally reviewed following labs and imaging studies  CBC: Recent Labs  Lab 06/29/19 0433 06/30/19 0522 07/01/19 0521 07/02/19 0510 07/03/19 0457  WBC 11.0* 12.6* 13.3* 13.9* 12.7*  NEUTROABS 7.1 8.9* 9.9* 10.6* 9.9*  HGB 8.5* 9.0* 8.9* 8.7* 8.5*  HCT 26.7* 28.3* 28.2* 27.3* 26.5*  MCV 95.0 94.6 94.9 95.5 95.3  PLT 237 248 269 237 [id number]   Basic Metabolic Panel: Recent Labs  Lab 06/29/19 0433 06/29/19 0433 06/30/19 0522 07/01/19 0521 07/02/19 0510 07/03/19 0457 07/04/19 0510  NA 138   < > 136 133* 134* 136 134*  K 4.0   < > 3.8 3.7 3.7 3.9 3.8  CL 98   < > 96* 94* 98 100 100  CO2 29   < > 31 29 28 27 25   GLUCOSE 116*   < > 118* 117* 120* 115* 112*  BUN 51*   < > 50* 56* 56* 55* 54*  CREATININE 1.78*   < > 1.78* 2.15* 1.73* 1.58* 1.62*  CALCIUM 8.4*   < > 8.6* 8.7* 8.5* 8.6* 8.5*  MG 1.9  --  1.7 1.9 2.0 1.9  --   PHOS 3.8  --  4.0 4.0 3.8 3.3  --    < > = values in this interval not displayed.   GFR: Estimated Creatinine Clearance: 25.5 mL/min (A) (by C-G formula based on SCr of 1.62 mg/dL (H)). Liver Function Tests: Recent Labs  Lab 06/29/19 0433 06/30/19 0522 07/01/19 0521 07/02/19 0510 07/03/19 0457  AST 21 21 22 25 29   ALT 16 18 18 20 24   ALKPHOS 98 107 98 102 102  BILITOT 1.4* 1.7* 1.4* 1.6* 1.2  PROT 6.2* 6.7 6.6 6.5 6.4*  ALBUMIN 2.9* 3.0* 2.9* 2.8* 2.8*   No results for input(s): LIPASE, AMYLASE in the last 168 hours. No results for  input(s): AMMONIA in the last 168 hours. Coagulation Profile: No results for input(s): INR, PROTIME in the last 168 hours. Cardiac Enzymes: No results for input(s): CKTOTAL, CKMB, CKMBINDEX, TROPONINI in the last 168 hours. BNP (last 3 results) No results for input(s): PROBNP in the last 8760 hours. HbA1C: No results for input(s): HGBA1C in the last 72 hours. CBG: No results for input(s): GLUCAP in the last 168 hours. Lipid Profile: No results for input(s): CHOL, HDL, LDLCALC, TRIG, CHOLHDL, LDLDIRECT in the last 72 hours. Thyroid Function Tests: Recent Labs    07/02/19 1041 07/03/19 0457  TSH 5.281*  --   FREET4  --  1.32*  T3FREE 1.6*  --    Anemia Panel: No results for input(s): VITAMINB12, FOLATE, FERRITIN, TIBC, IRON, RETICCTPCT in the last 72 hours. Sepsis Labs: No results for input(s): PROCALCITON, LATICACIDVEN in the last 168 hours.  Recent Results (from the past 240 hour(s))  Respiratory Panel  by RT PCR (Flu A&B, Covid) - Nasopharyngeal Swab     Status: None   Collection Time: 06/27/19 12:37 AM   Specimen: Nasopharyngeal Swab  Result Value Ref Range Status   SARS Coronavirus 2 by RT PCR NEGATIVE NEGATIVE Final    Comment: (NOTE) SARS-CoV-2 target nucleic acids are NOT DETECTED. The SARS-CoV-2 RNA is generally detectable in upper respiratoy specimens during the acute phase of infection. The lowest concentration of SARS-CoV-2 viral copies this assay can detect is 131 copies/mL. A negative result does not preclude SARS-Cov-2 infection and should not be used as the sole basis for treatment or other patient management decisions. A negative result may occur with  improper specimen collection/handling, submission of specimen other than nasopharyngeal swab, presence of viral mutation(s) within the areas targeted by this assay, and inadequate number of viral copies (<131 copies/mL). A negative result must be combined with clinical observations, patient history, and  epidemiological information. The expected result is Negative. Fact Sheet for Patients:  PinkCheek.be Fact Sheet for Healthcare Providers:  GravelBags.it This test is not yet ap proved or cleared by the [place] FDA and  has been authorized for detection and/or diagnosis of SARS-CoV-2 by FDA under an Emergency Use Authorization (EUA). This EUA will remain  in effect (meaning this test can be used) for the duration of the COVID-19 declaration under Section 564(b)(1) of the Act, 21 U.S.C. section 360bbb-3(b)(1), unless the authorization is terminated or revoked sooner.    Influenza A by PCR NEGATIVE NEGATIVE Final   Influenza B by PCR NEGATIVE NEGATIVE Final    Comment: (NOTE) The Xpert Xpress SARS-CoV-2/FLU/RSV assay is intended as an aid in  the diagnosis of influenza from Nasopharyngeal swab specimens and  should not be used as a sole basis for treatment. Nasal washings and  aspirates are unacceptable for Xpert Xpress SARS-CoV-2/FLU/RSV  testing. Fact Sheet for Patients: PinkCheek.be Fact Sheet for Healthcare Providers: GravelBags.it This test is not yet approved or cleared by the [place] FDA and  has been authorized for detection and/or diagnosis of SARS-CoV-2 by  FDA under an Emergency Use Authorization (EUA). This EUA will remain  in effect (meaning this test can be used) for the duration of the  Covid-19 declaration under Section 564(b)(1) of the Act, 21  U.S.C. section 360bbb-3(b)(1), unless the authorization is  terminated or revoked. Performed at [facility], [place] [address] [place], [place] [address]   Culture, blood (routine x 2)     Status: None (Preliminary result)   Collection Time: 07/02/19 10:39 AM   Specimen: BLOOD  Result Value Ref Range Status   Specimen Description   Final    BLOOD LEFT ARM Performed at [facility] [address], [place], [place] [address]    Special Requests   Final    BOTTLES DRAWN AEROBIC AND ANAEROBIC Blood Culture adequate volume Performed at [facility] [address] [place], [place] [address]    Culture   Final    NO GROWTH 2 DAYS Performed at [facility] [address] [place], [place] [address]    Report Status PENDING  Incomplete  Culture, blood (routine x 2)     Status: None (Preliminary result)   Collection Time: 07/02/19 10:41 AM   Specimen: BLOOD  Result Value Ref Range Status   Specimen Description   Final    BLOOD RIGHT ARM Performed at [facility] [address], [place], [place] [address]  Special Requests   Final    BOTTLES DRAWN AEROBIC AND ANAEROBIC Blood Culture adequate volume Performed at [facility] [address], [place], [place] [address]    Culture   Final    NO GROWTH 2 DAYS Performed at [facility] [address], [place], [place] [address]    Report Status PENDING  Incomplete  Culture, Urine     Status: Abnormal   Collection Time: 07/02/19  3:46 PM   Specimen: Urine, Clean Catch  Result Value Ref Range Status   Specimen Description   Final    URINE, CLEAN CATCH Performed at [facility], [place] [address] [place], [place] [address]    Special Requests   Final    NONE Performed at [facility], [place] [address] [place], [place] [address]    Culture (A)  Final    <10,000 COLONIES/mL INSIGNIFICANT GROWTH Performed at [facility] [address], [place], [place] [address]    Report Status 07/03/2019 FINAL  Final  SARS CORONAVIRUS 2 (TAT 6-24 HRS) Nasopharyngeal Nasopharyngeal Swab     Status: None   Collection Time: 07/03/19  9:49 PM   Specimen: Nasopharyngeal Swab  Result Value Ref Range Status   SARS Coronavirus 2 NEGATIVE NEGATIVE Final    Comment: (NOTE) SARS-CoV-2 target nucleic  acids are NOT DETECTED. The SARS-CoV-2 RNA is generally detectable in upper and lower respiratory specimens during the acute phase of infection. Negative results do not preclude SARS-CoV-2 infection, do not rule out co-infections with other pathogens, and should not be used as the sole basis for treatment or other patient management decisions. Negative results must be combined with clinical observations, patient history, and epidemiological information. The expected result is Negative. Fact Sheet for Patients: SugarRoll.be Fact Sheet for Healthcare Providers: https://www.woods-mathews.com/ This test is not yet approved or cleared by the [place] FDA and  has been authorized for detection and/or diagnosis of SARS-CoV-2 by FDA under an Emergency Use Authorization (EUA). This EUA will remain  in effect (meaning this test can be used) for the duration of the COVID-19 declaration under Section 56 4(b)(1) of the Act, 21 U.S.C. section 360bbb-3(b)(1), unless the authorization is terminated or revoked sooner. Performed at [facility], [place] [address], [place], [place] [address]          Radiology Studies: DG CHEST PORT 1 VIEW  Result Date: 07/03/2019 CLINICAL DATA:  Shortness of breath EXAM: PORTABLE CHEST 1 VIEW COMPARISON:  One day prior FINDINGS: Numerous leads and wires project over the chest. Midline trachea. Mild cardiomegaly. Atherosclerosis in the transverse aorta. No pleural effusion or pneumothorax. Improved interstitial edema with mild pulmonary venous congestion remaining. Improved left base airspace disease. IMPRESSION: Improved aeration, improved to resolved interstitial edema. Mild pulmonary venous congestion remains. Improved left base Airspace disease, likely atelectasis. Electronically Signed   By: Abigail Miyamoto M.D.   On: 07/03/2019 09:08        Scheduled Meds: . amLODipine  5 mg Oral QPM  . apixaban  2.5 mg Oral  BID  . diclofenac Sodium  2 g Topical QID  . doxazosin  2 mg Oral Daily  . guaiFENesin  1,200 mg Oral BID  . ipratropium  0.5 mg Nebulization TID  . iron polysaccharides  150 mg Oral Daily  . latanoprost  1 drop Both Eyes QHS  . levalbuterol  0.63 mg Nebulization TID  . metoprolol succinate  25 mg Oral Daily  .  polyvinyl alcohol  1 drop Both Eyes QHS  . potassium chloride  20 mEq Oral Daily  . vitamin B-12  500 mcg Oral q morning - 10a   Continuous Infusions:   LOS: 8 days     Cordelia Poche, MD [facility] 07/04/2019, 3:54 PM  If 7PM-7AM, please contact night-coverage [email or web address]

## 2019-07-04 NOTE — Progress Notes (Signed)
    [facility] will sign off.   Medication Recommendations:  Meds as on MAR.   Other recommendations (labs, testing, etc):   Follow up as an outpatient:  Follow up with PCP. We will schedule follow up with Dr. Tanna Furry office in one month.

## 2019-07-04 NOTE — TOC Progression Note (Signed)
Transition of Care [facility]) - Progression Note    Patient Details  Name: Cody Mendoza MRN: [id number] Date of Birth: 02-04-1922  [facility]) CM/SW Contact  Mehki Klumpp, Juliann Pulse, RN Phone Number: 07/04/2019, 11:43 AM  Clinical Narrative: From [facility] living-w/c bound,has private duty care. PT orderd await recc.facility has [facility] for PT if needed.DNR in shadow chart for MD signature. PTAR @ d/c.      Expected Discharge Plan: Home/Self Care Barriers to Discharge: Continued Medical Work up  Expected Discharge Plan and Services Expected Discharge Plan: Home/Self Care   Discharge Planning Services: CM Consult   Living arrangements for the past 2 months: [place]                                       Social Determinants of Health (SDOH) Interventions    Readmission Risk Interventions No flowsheet data found.

## 2019-07-04 NOTE — Evaluation (Signed)
Physical Therapy Evaluation Patient Details Name: Cody Mendoza MRN: [id number] DOB: August 25, 1921 Today's Date: 07/04/2019   History of Present Illness  86 y.o. male with a history of atrial fibrillation, hypertension, mitral valve insufficiency, GERD, history of [name] syndrome, history of TIA. Patient presented secondary to scrotal pain, leg swelling and dyspnea in the setting of acute heart failure  Clinical Impression  Pt admitted with above diagnosis.  Pt currently with functional limitations due to the deficits listed below (see PT Problem List). Pt will benefit from skilled PT to increase their independence and safety with mobility to allow discharge to the venue listed below.  Pt from ILF and typically has some assist for bed mobility but at baseline, pt is able to self assist with standing transfer with equipment.  Utilized stedy today (closed to pt and son's description of equipment) and pt was unable to stand even with +2 assist.  Pt presents with generalized weakness, poor balance, and decreased endurance and would benefit from SNF upon d/c prior to return to ILF.    Follow Up Recommendations SNF    Equipment Recommendations  None recommended by PT    Recommendations for Other Services       Precautions / Restrictions Precautions Precautions: Fall      Mobility  Bed Mobility Overal bed mobility: Needs Assistance Bed Mobility: Supine to Sit;Sit to Supine     Supine to sit: Max assist;+2 for physical assistance Sit to supine: Max assist;+2 for physical assistance   General bed mobility comments: assist for upper and lower body  Transfers Overall transfer level: Needs assistance   Transfers: Sit to/from Stand           General transfer comment: attempted with RW and pt prevented from sliding forward onto knees by utilizing bed pad to pull pt back onto bed; pt reports equipment at facility with foot plate and bar in front so he can self assist upright so  utilized stedy however pt unable to stand even with +2 assist and attempted x4  Ambulation/Gait                Stairs            Wheelchair Mobility    Modified Rankin (Stroke Patients Only)       Balance Overall balance assessment: Needs assistance         Standing balance support: Bilateral upper extremity supported Standing balance-Leahy Scale: Zero Standing balance comment: requires external support                             Pertinent Vitals/Pain Pain Assessment: No/denies pain    Home Living Family/patient expects to be discharged to:: Private residence     Type of Home: Independent living facility                Prior Function Level of Independence: Needs assistance   Gait / Transfers Assistance Needed: uses equipment at facility to transfer however pt has to be able to assist with upper body strength to standing, w/c for mobility           Hand Dominance        Extremity/Trunk Assessment   Upper Extremity Assessment Upper Extremity Assessment: Generalized weakness    Lower Extremity Assessment Lower Extremity Assessment: Generalized weakness;RLE deficits/detail RLE Deficits / Details: mostly maintains flexed position, requires assist for LE movement, no active movement observed (pt self assists and repositions by  lifting LE with UEs)       Communication   Communication: HOH  Cognition Arousal/Alertness: Awake/alert Behavior During Therapy: WFL for tasks assessed/performed Overall Cognitive Status: Impaired/Different from baseline                                 General Comments: pt with confusion during session, stating his equipment is here - reorientated a few times to being in the hospital      General Comments      Exercises     Assessment/Plan    PT Assessment Patient needs continued PT services  PT Problem List Decreased strength;Decreased mobility;Decreased activity  tolerance;Decreased balance;Decreased knowledge of use of DME;Decreased coordination;Decreased safety awareness       PT Treatment Interventions DME instruction;Therapeutic activities;Therapeutic exercise;Patient/family education;Functional mobility training;Balance training;Wheelchair mobility training    PT Goals (Current goals can be found in the Care Plan section)  Acute Rehab PT Goals PT Goal Formulation: With patient/family Time For Goal Achievement: 07/18/19 Potential to Achieve Goals: Fair    Frequency Min 2X/week   Barriers to discharge        Co-evaluation               AM-PAC PT "6 Clicks" Mobility  Outcome Measure Help needed turning from your back to your side while in a flat bed without using bedrails?: Total Help needed moving from lying on your back to sitting on the side of a flat bed without using bedrails?: Total Help needed moving to and from a bed to a chair (including a wheelchair)?: Total Help needed standing up from a chair using your arms (e.g., wheelchair or bedside chair)?: Total Help needed to walk in hospital room?: Total Help needed climbing 3-5 steps with a railing? : Total 6 Click Score: 6    End of Session Equipment Utilized During Treatment: Gait belt Activity Tolerance: Patient tolerated treatment well Patient left: in bed;with bed alarm set;with call bell/phone within reach Nurse Communication: Mobility status;Need for lift equipment PT Visit Diagnosis: Muscle weakness (generalized) (M62.81);Other abnormalities of gait and mobility (R26.89)    Time: [id number] PT Time Calculation (min) (ACUTE ONLY): 25 min   Charges:   PT Evaluation $PT Eval Moderate Complexity: 1 Mod     Kati PT, DPT Acute Rehabilitation Services Office: [phone]   Trena Platt 07/04/2019, 3:45 PM

## 2019-07-04 NOTE — Care Management Important Message (Signed)
Important Message  Patient Details IM Letter given to [name] RN Case Manager to present to the Patient Name: Cody Mendoza MRN: [id number] Date of Birth: [birth year]   Medicare Important Message Given:  Yes     [name] 07/04/2019, 12:07 PM

## 2019-07-05 ENCOUNTER — Inpatient Hospital Stay (HOSPITAL_COMMUNITY): Payer: Medicare Other

## 2019-07-05 DIAGNOSIS — R410 Disorientation, unspecified: Secondary | ICD-10-CM

## 2019-07-05 LAB — BASIC METABOLIC PANEL
Anion gap: 10 (ref 5–15)
BUN: 54 mg/dL — ABNORMAL HIGH (ref 8–23)
CO2: 26 mmol/L (ref 22–32)
Calcium: 8.7 mg/dL — ABNORMAL LOW (ref 8.9–10.3)
Chloride: 102 mmol/L (ref 98–111)
Creatinine, Ser: 1.33 mg/dL — ABNORMAL HIGH (ref 0.61–1.24)
GFR calc Af Amer: 52 mL/min — ABNORMAL LOW (ref >60)
GFR calc non Af Amer: 45 mL/min — ABNORMAL LOW (ref >60)
Glucose, Bld: 128 mg/dL — ABNORMAL HIGH (ref 70–99)
Potassium: 4.3 mmol/L (ref 3.5–5.1)
Sodium: 138 mmol/L (ref 135–145)

## 2019-07-05 LAB — URINALYSIS, ROUTINE W REFLEX MICROSCOPIC
Bacteria, UA: NONE SEEN
Bilirubin Urine: NEGATIVE
Glucose, UA: NEGATIVE mg/dL
Ketones, ur: NEGATIVE mg/dL
Leukocytes,Ua: NEGATIVE
Nitrite: NEGATIVE
Protein, ur: NEGATIVE mg/dL
Specific Gravity, Urine: 1.008 (ref 1.005–1.030)
pH: 5 (ref 5.0–8.0)

## 2019-07-05 MED ORDER — QUETIAPINE FUMARATE 25 MG PO TABS
25.0000 mg | ORAL_TABLET | Freq: Once | ORAL | Status: AC
  Administered 2019-07-05: 25 mg via ORAL
  Filled 2019-07-05: qty 1

## 2019-07-05 MED ORDER — FUROSEMIDE 10 MG/ML IJ SOLN
40.0000 mg | Freq: Once | INTRAMUSCULAR | Status: AC
  Administered 2019-07-05: 40 mg via INTRAVENOUS

## 2019-07-05 NOTE — TOC Progression Note (Signed)
Transition of Care [facility]) - Progression Note    Patient Details  Name: Cody Mendoza MRN: [id number] Date of Birth: 02-Mar-1922  Transition of Care [facility]) CM/SW Contact  Sudais Banghart, Juliann Pulse, RN Phone Number: 07/05/2019, 10:11 AM  Clinical Narrative: PT recc SNF-[facility]-informd patient needs higher level rehab-agreed. TC son [name]/patient informed of higher level needed both agree to SNF-faxed [facility] chosen by son-rep [facility] aware, & following for d/c-contact her over weekend for d/c if [phone].Auth received form [facility] rep Lisa-ref#[id number](auth id not loaded yet)Monique Lorel Monaco with [facility] CM @ [facility] will f/u on auth id# once loaded. Faxed clinicals with confirmation.covid  Neg 4/28.Await medical stability.     Expected Discharge Plan: [place] Barriers to Discharge: Continued Medical Work up  Expected Discharge Plan and Services Expected Discharge Plan: [place]   Discharge Planning Services: CM Consult   Living arrangements for the past 2 months: [place]                                       Social Determinants of Health (SDOH) Interventions    Readmission Risk Interventions No flowsheet data found.

## 2019-07-05 NOTE — Evaluation (Signed)
Occupational Therapy Evaluation Patient Details Name: Cody Mendoza MRN: [id number] DOB: [birth year] Today's Date: 07/05/2019    History of Present Illness 86 y.o. male with a history of atrial fibrillation, hypertension, mitral valve insufficiency, GERD, history of [name] syndrome, history of TIA. Patient presented secondary to scrotal pain, leg swelling and dyspnea in the setting of acute heart failure   Clinical Impression   [name] is a 86 year old man admitted to hospital with acute CHF. On evaluation he presents with increased confusion, generalized weakness, impaired balance and poor activity tolerance compared to his baseline. Patient requiring max assist x 2 for transfers and max assist to stabilize at side of bed and set up and verbal cues to perform grooming task. Patient will benefit from skilled OT services to improve deficits and abilities in order to improve functional abilities and reduce caregiver burden. Patient will benefit from short term rehab at discharge.     Follow Up Recommendations  SNF    Equipment Recommendations       Recommendations for Other Services       Precautions / Restrictions Precautions Precautions: Fall Restrictions Weight Bearing Restrictions: No      Mobility Bed Mobility Overal bed mobility: Needs Assistance Bed Mobility: Supine to Sit;Sit to Supine     Supine to sit: +2 for physical assistance Sit to supine: Max assist;+2 for physical assistance      Transfers                 General transfer comment: Unable to perform sit to stand or transfer today as patient too unsafe due to cognitive impairments, difficulty following commands and fear of falling.    Balance Overall balance assessment: Needs assistance   Sitting balance-Leahy Scale: Zero                                     ADL either performed or assessed with clinical judgement   ADL Overall ADL's : Needs  assistance/impaired Eating/Feeding: Set up   Grooming: Set up;Cueing for sequencing Grooming Details (indicate cue type and reason): Patient seated at side of bed to wash face and brush teeth. Patient needed setup and visual cues to perfrom. Poor quality. Poor sitting balance. Upper Body Bathing: Minimal assistance;Cueing for sequencing   Lower Body Bathing: Total assistance;Bed level   Upper Body Dressing : Moderate assistance;Bed level   Lower Body Dressing: Total assistance;Bed level     Toilet Transfer Details (indicate cue type and reason): unable at this time Toileting- Water quality scientist and Hygiene: Total assistance;Bed level     Tub/Shower Transfer Details (indicate cue type and reason): unable Functional mobility during ADLs: +2 for physical assistance General ADL Comments: Patient transferred to side of bed with +2 assistance. Patient leaning back and to the right requiring max assist from rehab tech to maintain his position. Patient unable to stand. Patient unsafe and impulsive. Patient jerks forward to grasp anythng in front of him.     Vision Baseline Vision/History: Wears glasses Vision Assessment?: No apparent visual deficits Additional Comments: Able to read large print     Perception     Praxis      Pertinent Vitals/Pain       Hand Dominance Right   Extremity/Trunk Assessment Upper Extremity Assessment Upper Extremity Assessment: Generalized weakness   Lower Extremity Assessment Lower Extremity Assessment: Defer to PT evaluation  Communication Communication Communication: HOH   Cognition Arousal/Alertness: Awake/alert Behavior During Therapy: Restless;Impulsive Overall Cognitive Status: Impaired/Different from baseline Area of Impairment: Orientation;Attention;Memory;Following commands;Safety/judgement;Awareness;Problem solving                 Orientation Level: Person Current Attention Level: Alternating Memory: Decreased  short-term memory Following Commands: Follows one step commands consistently;Follows one step commands inconsistently Safety/Judgement: Decreased awareness of safety Awareness: Emergent Problem Solving: Requires verbal cues General Comments: RN reports increased confusion from yesterday.   General Comments       Exercises     Shoulder Instructions      Home Living Family/patient expects to be discharged to:: Private residence     Type of Home: Independent living facility                                  Prior Functioning/Environment Level of Independence: Needs assistance  Gait / Transfers Assistance Needed: uses equipment at facility to transfer however pt has to be able to assist with upper body strength to standing, w/c for mobility              OT Problem List: Decreased strength;Decreased activity tolerance;Impaired balance (sitting and/or standing);Decreased safety awareness;Decreased cognition      OT Treatment/Interventions: Self-care/ADL training;Therapeutic activities;Balance training;Patient/family education;Therapeutic exercise    OT Goals(Current goals can be found in the care plan section) Acute Rehab OT Goals Patient Stated Goal: unable to state OT Goal Formulation: Patient unable to participate in goal setting Time For Goal Achievement: 07/18/19 Potential to Achieve Goals: Fair  OT Frequency: Min 2X/week   Barriers to D/C:            Co-evaluation              AM-PAC OT "6 Clicks" Daily Activity     Outcome Measure Help from another person eating meals?: A Little Help from another person taking care of personal grooming?: A Little Help from another person toileting, which includes using toliet, bedpan, or urinal?: Total Help from another person bathing (including washing, rinsing, drying)?: A Lot Help from another person to put on and taking off regular upper body clothing?: A Lot Help from another person to put on and taking  off regular lower body clothing?: Total 6 Click Score: 12   End of Session Nurse Communication: Need for lift equipment  Activity Tolerance: Patient tolerated treatment well Patient left: in bed;with call bell/phone within reach;with bed alarm set  OT Visit Diagnosis: Unsteadiness on feet (R26.81);Muscle weakness (generalized) (M62.81);History of falling (Z91.81)                Time: [id number] OT Time Calculation (min): 16 min Charges:  OT General Charges $OT Visit: 1 Visit OT Evaluation $OT Eval Moderate Complexity: 1 Mod  Jonie Burdell, OTR/L [place]  Office [phone]   Lenward Chancellor 07/05/2019, 12:49 PM

## 2019-07-05 NOTE — Progress Notes (Signed)
PROGRESS NOTE    Cody Mendoza  [id number] DOB: 08-Jan-1922 DOA: 06/26/2019 PCP: Crist Infante, MD   Brief Narrative: Cody Mendoza is a 86 y.o. male with a history of atrial fibrillation, hypertension, mitral valve insufficiency, GERD, history of Gilbertsyndrome, history of TIA. Patient presented secondary to scrotal pain, leg swelling and dyspnea in the setting of acute heart failure. Patient started on IV lasix diuresis with improvement of symptoms.   Assessment & Plan:   Principal Problem:   Acute CHF (congestive heart failure) (HCC) Active Problems:   Hypertension   Atrial fibrillation (HCC)   Discitis of thoracic region   CKD (chronic kidney disease) stage 2, GFR 60-89 ml/min   TIA (transient ischemic attack)   Acute on chronic diastolic heart failure Right sided heart failure Patient started on IV diuresis. Current management per cardiology. Concern exacerbation is secondary to increased salt intake. Some rales on exam today -Cardiology recommendations: signed off -Lasix 40 mg IV x1 today -BMP this afternoon  Dyspnea Improved with treatment of heart failure  Chronic atrial fibrillation Coreg changed to metoprolol -Continue Metoprolol and Eliquis (dose adjusted secondary to renal function)  Essential hypertension -Continue metoprolol and amlodipine  T8-9 discitis Previously on doxycycline which is now discontinued per ID recommendations  Confusion Acute. Per son, this is the second time he has had an a confusion spell. CT scan of the head obtained at that time which was not significant for acute process. No focal neurological deficit currently -Urinalysis, urine culture, MRI  Spinal stenosis Bed bound and uses a wheelchair at baseline.  Chronic normocytic anemia Stable.  Leukocytosis Elevated and stable. No evidence of infection  Hypokalemia Replete as needed  Hypomagnesemia Replete as needed  AKI on CKD stage IIIa AKI secondary to  diuresis. Improving with holding Lasix initially with slight worsening. -BMP as mentioned above  Abnormal thyroid labs Difficult to interpret. Would suggest possible central hyperthyroidism. Will discuss with endocrinology.  DVT prophylaxis: Eliquis Code Status:   Code Status: DNR Family Communication: Son on telephone Disposition Plan: Discharge to SNF once confusion resolved and kidney function is stable.   Consultants:   Cardiology  Procedures:   TRANSTHORACIC ECHOCARDIOGRAM (06/27/19) IMPRESSIONS    1. Left ventricular ejection fraction, by estimation, is 55 to 60%. The  left ventricle has normal function. The left ventricle has no regional  wall motion abnormalities. Left ventricular diastolic parameters are  indeterminate.  2. Right ventricular systolic function is mildly reduced. The right  ventricular size is normal. There is mildly elevated pulmonary artery  systolic pressure. The estimated right ventricular systolic pressure is  [id number] mmHg.  3. Left atrial size was moderately dilated.  4. Right atrial size was mildly dilated.  5. The mitral valve is normal in structure. Trivial mitral valve  regurgitation. No evidence of mitral stenosis.  6. The aortic valve is tricuspid. Aortic valve regurgitation is not  visualized. Mild aortic valve sclerosis is present, with no evidence of  aortic valve stenosis.  7. The inferior vena cava is dilated in size with >50% respiratory  variability, suggesting right atrial pressure of 8 mmHg.  8. The patient is in atrial fibrillation.   Antimicrobials:  None    Subjective: Confused  Objective: Vitals:   07/05/19 0418 07/05/19 0421 07/05/19 0743 07/05/19 0745  BP: 136/79     Pulse: 92     Resp: (!) 24     Temp: 97.8 F (36.6 C)     TempSrc: Oral  SpO2: 96%  95% 95%  Weight:  71.8 kg    Height:        Intake/Output Summary (Last 24 hours) at 07/05/2019 1016 Last data filed at 07/05/2019 0600 Gross per 24  hour  Intake 960 ml  Output 380 ml  Net 580 ml   Filed Weights   07/03/19 0507 07/04/19 0452 07/05/19 0421  Weight: 72.2 kg 72.4 kg 71.8 kg    Examination:  General exam: Appears calm and comfortable  Respiratory system: Diminished with rales. Respiratory effort normal. Cardiovascular system: S1 & S2 heard, RRR. No murmurs, rubs, gallops or clicks. Gastrointestinal system: Abdomen is distended, soft and nontender. No organomegaly or masses felt. Normal bowel sounds heard. Central nervous system: Alert and not oriented. No focal neurological deficits. Patient speaking seemingly randomly Extremities: No edema. No calf tenderness Skin: No cyanosis. No rashes Psychiatry: Judgement and insight appear impaired.    Data Reviewed: I have personally reviewed following labs and imaging studies  CBC: Recent Labs  Lab 06/29/19 0433 06/30/19 0522 07/01/19 0521 07/02/19 0510 07/03/19 0457  WBC 11.0* 12.6* 13.3* 13.9* 12.7*  NEUTROABS 7.1 8.9* 9.9* 10.6* 9.9*  HGB 8.5* 9.0* 8.9* 8.7* 8.5*  HCT 26.7* 28.3* 28.2* 27.3* 26.5*  MCV 95.0 94.6 94.9 95.5 95.3  PLT 237 248 269 237 [id number]   Basic Metabolic Panel: Recent Labs  Lab 06/29/19 0433 06/29/19 0433 06/30/19 0522 07/01/19 0521 07/02/19 0510 07/03/19 0457 07/04/19 0510  NA 138   < > 136 133* 134* 136 134*  K 4.0   < > 3.8 3.7 3.7 3.9 3.8  CL 98   < > 96* 94* 98 100 100  CO2 29   < > 31 29 28 27 25   GLUCOSE 116*   < > 118* 117* 120* 115* 112*  BUN 51*   < > 50* 56* 56* 55* 54*  CREATININE 1.78*   < > 1.78* 2.15* 1.73* 1.58* 1.62*  CALCIUM 8.4*   < > 8.6* 8.7* 8.5* 8.6* 8.5*  MG 1.9  --  1.7 1.9 2.0 1.9  --   PHOS 3.8  --  4.0 4.0 3.8 3.3  --    < > = values in this interval not displayed.   GFR: Estimated Creatinine Clearance: 25.5 mL/min (A) (by C-G formula based on SCr of 1.62 mg/dL (H)). Liver Function Tests: Recent Labs  Lab 06/29/19 0433 06/30/19 0522 07/01/19 0521 07/02/19 0510 07/03/19 0457  AST 21 21 22 25  29   ALT 16 18 18 20 24   ALKPHOS 98 107 98 102 102  BILITOT 1.4* 1.7* 1.4* 1.6* 1.2  PROT 6.2* 6.7 6.6 6.5 6.4*  ALBUMIN 2.9* 3.0* 2.9* 2.8* 2.8*   No results for input(s): LIPASE, AMYLASE in the last 168 hours. No results for input(s): AMMONIA in the last 168 hours. Coagulation Profile: No results for input(s): INR, PROTIME in the last 168 hours. Cardiac Enzymes: No results for input(s): CKTOTAL, CKMB, CKMBINDEX, TROPONINI in the last 168 hours. BNP (last 3 results) No results for input(s): PROBNP in the last 8760 hours. HbA1C: No results for input(s): HGBA1C in the last 72 hours. CBG: No results for input(s): GLUCAP in the last 168 hours. Lipid Profile: No results for input(s): CHOL, HDL, LDLCALC, TRIG, CHOLHDL, LDLDIRECT in the last 72 hours. Thyroid Function Tests: Recent Labs    07/02/19 1041 07/03/19 0457  TSH 5.281*  --   FREET4  --  1.32*  T3FREE 1.6*  --    Anemia Panel: No  results for input(s): VITAMINB12, FOLATE, FERRITIN, TIBC, IRON, RETICCTPCT in the last 72 hours. Sepsis Labs: No results for input(s): PROCALCITON, LATICACIDVEN in the last 168 hours.  Recent Results (from the past 240 hour(s))  Respiratory Panel by RT PCR (Flu A&B, Covid) - Nasopharyngeal Swab     Status: None   Collection Time: 06/27/19 12:37 AM   Specimen: Nasopharyngeal Swab  Result Value Ref Range Status   SARS Coronavirus 2 by RT PCR NEGATIVE NEGATIVE Final    Comment: (NOTE) SARS-CoV-2 target nucleic acids are NOT DETECTED. The SARS-CoV-2 RNA is generally detectable in upper respiratoy specimens during the acute phase of infection. The lowest concentration of SARS-CoV-2 viral copies this assay can detect is 131 copies/mL. A negative result does not preclude SARS-Cov-2 infection and should not be used as the sole basis for treatment or other patient management decisions. A negative result may occur with  improper specimen collection/handling, submission of specimen other than  nasopharyngeal swab, presence of viral mutation(s) within the areas targeted by this assay, and inadequate number of viral copies (<131 copies/mL). A negative result must be combined with clinical observations, patient history, and epidemiological information. The expected result is Negative. Fact Sheet for Patients:  PinkCheek.be Fact Sheet for Healthcare Providers:  GravelBags.it This test is not yet ap proved or cleared by the [place] FDA and  has been authorized for detection and/or diagnosis of SARS-CoV-2 by FDA under an Emergency Use Authorization (EUA). This EUA will remain  in effect (meaning this test can be used) for the duration of the COVID-19 declaration under Section 564(b)(1) of the Act, 21 U.S.C. section 360bbb-3(b)(1), unless the authorization is terminated or revoked sooner.    Influenza A by PCR NEGATIVE NEGATIVE Final   Influenza B by PCR NEGATIVE NEGATIVE Final    Comment: (NOTE) The Xpert Xpress SARS-CoV-2/FLU/RSV assay is intended as an aid in  the diagnosis of influenza from Nasopharyngeal swab specimens and  should not be used as a sole basis for treatment. Nasal washings and  aspirates are unacceptable for Xpert Xpress SARS-CoV-2/FLU/RSV  testing. Fact Sheet for Patients: PinkCheek.be Fact Sheet for Healthcare Providers: GravelBags.it This test is not yet approved or cleared by the [place] FDA and  has been authorized for detection and/or diagnosis of SARS-CoV-2 by  FDA under an Emergency Use Authorization (EUA). This EUA will remain  in effect (meaning this test can be used) for the duration of the  Covid-19 declaration under Section 564(b)(1) of the Act, 21  U.S.C. section 360bbb-3(b)(1), unless the authorization is  terminated or revoked. Performed at [facility], [place] [address], [place], [place] [address]    Culture, blood (routine x 2)     Status: None (Preliminary result)   Collection Time: 07/02/19 10:39 AM   Specimen: BLOOD  Result Value Ref Range Status   Specimen Description   Final    BLOOD LEFT ARM Performed at [facility] [address] [place], [place] [address]    Special Requests   Final    BOTTLES DRAWN AEROBIC AND ANAEROBIC Blood Culture adequate volume Performed at [facility] [address], [place], [place] [address]    Culture   Final    NO GROWTH 2 DAYS Performed at [facility] [address] [place], [place] [address]    Report Status PENDING  Incomplete  Culture, blood (routine x 2)     Status: None (Preliminary result)   Collection Time: 07/02/19  10:41 AM   Specimen: BLOOD  Result Value Ref Range Status   Specimen Description   Final    BLOOD RIGHT ARM Performed at [facility] [address] [place], [place] [address]    Special Requests   Final    BOTTLES DRAWN AEROBIC AND ANAEROBIC Blood Culture adequate volume Performed at [facility] [address], [place], [place] [address]    Culture   Final    NO GROWTH 2 DAYS Performed at [facility] [address], [place], [place] [address]    Report Status PENDING  Incomplete  Culture, Urine     Status: Abnormal   Collection Time: 07/02/19  3:46 PM   Specimen: Urine, Clean Catch  Result Value Ref Range Status   Specimen Description   Final    URINE, CLEAN CATCH Performed at [facility], [place] [address], [place], [place] [address]    Special Requests   Final    NONE Performed at [facility], [place] [address], [place], [place] [address]    Culture (A)  Final    <10,000 COLONIES/mL INSIGNIFICANT GROWTH Performed at [facility] [address] [place], [place] [address]    Report Status 07/03/2019 FINAL  Final  SARS CORONAVIRUS 2 (TAT 6-24 HRS) Nasopharyngeal  Nasopharyngeal Swab     Status: None   Collection Time: 07/03/19  9:49 PM   Specimen: Nasopharyngeal Swab  Result Value Ref Range Status   SARS Coronavirus 2 NEGATIVE NEGATIVE Final    Comment: (NOTE) SARS-CoV-2 target nucleic acids are NOT DETECTED. The SARS-CoV-2 RNA is generally detectable in upper and lower respiratory specimens during the acute phase of infection. Negative results do not preclude SARS-CoV-2 infection, do not rule out co-infections with other pathogens, and should not be used as the sole basis for treatment or other patient management decisions. Negative results must be combined with clinical observations, patient history, and epidemiological information. The expected result is Negative. Fact Sheet for Patients: SugarRoll.be Fact Sheet for Healthcare Providers: https://www.woods-mathews.com/ This test is not yet approved or cleared by the [place] FDA and  has been authorized for detection and/or diagnosis of SARS-CoV-2 by FDA under an Emergency Use Authorization (EUA). This EUA will remain  in effect (meaning this test can be used) for the duration of the COVID-19 declaration under Section 56 4(b)(1) of the Act, 21 U.S.C. section 360bbb-3(b)(1), unless the authorization is terminated or revoked sooner. Performed at [facility], [place] [address], [place], [place] [address]          Radiology Studies: No results found.      Scheduled Meds: . amLODipine  5 mg Oral QPM  . apixaban  2.5 mg Oral BID  . diclofenac Sodium  2 g Topical QID  . doxazosin  2 mg Oral Daily  . furosemide  40 mg Intravenous Once  . guaiFENesin  1,200 mg Oral BID  . ipratropium  0.5 mg Nebulization TID  . iron polysaccharides  150 mg Oral Daily  . latanoprost  1 drop Both Eyes QHS  . levalbuterol  0.63 mg Nebulization TID  . metoprolol succinate  25 mg Oral Daily  . polyvinyl alcohol  1 drop Both Eyes QHS  . potassium  chloride  20 mEq Oral Daily  . vitamin B-12  500 mcg Oral q morning - 10a   Continuous Infusions:   LOS: 9 days     Cordelia Poche, MD  [facility] 07/05/2019, 10:16 AM  If 7PM-7AM, please contact night-coverage [email or web address]

## 2019-07-05 NOTE — NC FL2 (Signed)
[place] LEVEL OF CARE SCREENING TOOL     IDENTIFICATION  Patient Name: Cody Mendoza Birthdate: 05/26/1921 Sex: male Admission Date (Current Location): 06/26/2019  [facility] and [place] Number:  Herbalist and Address:  [facility],  [place] [address] [place]      Provider Number:    Attending Physician Name and Address:  Mariel Aloe, MD  Relative Name and Phone NumberAgostino Loehrer [phone]    Current Level of Care: Hospital Recommended Level of Care: [place] Prior Approval Number:    Date Approved/Denied:   PASRR Number: [id number]  Discharge Plan: SNF    Current Diagnoses: Patient Active Problem List   Diagnosis Date Noted  . Acute CHF (congestive heart failure) ([place]) 06/26/2019  . Lower extremity edema 09/13/2018  . Near syncope 08/28/2018  . Hyponatremia 08/28/2018  . TIA (transient ischemic attack) 08/27/2018  . Discitis of thoracic region 08/23/2018  . CKD (chronic kidney disease) stage 2, GFR 60-89 ml/min 08/23/2018  . Chronic anemia 08/23/2018  . Discitis 08/23/2018  . Puncture wound of right lower leg without foreign body 10/08/2013  . Mitral insufficiency 12/28/2010  . Atrial fibrillation ([place]) 11/16/2010  . Hypertension   . GERD 04/22/2010  . PERSONAL HX COLONIC POLYPS 04/22/2010    Orientation RESPIRATION BLADDER Height & Weight     Self, Time, Situation  Normal Incontinent Weight: 71.8 kg Height:  5\' 7"  (170.2 cm)  BEHAVIORAL SYMPTOMS/MOOD NEUROLOGICAL BOWEL NUTRITION STATUS      Incontinent Diet(Heart Healthy 1200 fluid restriction)  AMBULATORY STATUS COMMUNICATION OF NEEDS Skin   Limited Assist Verbally Normal                       Personal Care Assistance Level of Assistance  Bathing, Feeding, Dressing Bathing Assistance: Limited assistance Feeding assistance: Limited assistance Dressing Assistance: Limited assistance     Functional Limitations  Info  Sight, Hearing, Speech Sight Info: Impaired(eyeglasses) Hearing Info: Impaired(Bilateral hearing aids) Speech Info: Adequate    SPECIAL CARE FACTORS FREQUENCY  PT (By licensed PT), OT (By licensed OT)     PT Frequency: 5x week OT Frequency: 5x week            Contractures Contractures Info: Not present    Additional Factors Info  Code Status, Allergies Code Status Info: DNR Allergies Info: NKA           Current Medications (07/05/2019):  This is the current hospital active medication list Current Facility-Administered Medications  Medication Dose Route Frequency Provider Last Rate Last Admin  . acetaminophen (TYLENOL) tablet 650 mg  650 mg Oral Q6H PRN Lovey Newcomer T, NP   650 mg at 07/01/19 1751  . amLODipine (NORVASC) tablet 5 mg  5 mg Oral QPM [name], MD   5 mg at 07/04/19 1723  . apixaban (ELIQUIS) tablet 2.5 mg  2.5 mg Oral BID Raiford Noble [place], DO   2.5 mg at 07/05/19 0850  . diclofenac Sodium (VOLTAREN) 1 % topical gel 2 g  2 g Topical QID Raiford Noble [place], DO   2 g at 07/05/19 0850  . doxazosin (CARDURA) tablet 2 mg  2 mg Oral Daily [name], MD   2 mg at 07/05/19 0849  . guaiFENesin (MUCINEX) 12 hr tablet 1,200 mg  1,200 mg Oral BID Raiford Noble [place], DO   1,200 mg at 07/05/19 [id number]  . ipratropium (ATROVENT) nebulizer solution  0.5 mg  0.5 mg Nebulization Q6H PRN Raiford Noble Latif, DO      . ipratropium (ATROVENT) nebulizer solution 0.5 mg  0.5 mg Nebulization TID Raiford Noble Latif, DO   0.5 mg at 07/05/19 [id number]  . iron polysaccharides (NIFEREX) capsule 150 mg  150 mg Oral Daily Raiford Noble [place], DO   150 mg at 07/05/19 0849  . latanoprost (XALATAN) 0.005 % ophthalmic solution 1 drop  1 drop Both Eyes QHS [name], MD   1 drop at 07/04/19 2118  . levalbuterol (XOPENEX) nebulizer solution 0.63 mg  0.63 mg Nebulization Q6H PRN Raiford Noble [place], DO   0.63 mg at 06/27/19 2234  . levalbuterol (XOPENEX) nebulizer  solution 0.63 mg  0.63 mg Nebulization TID Raiford Noble Latif, DO   0.63 mg at 07/05/19 0743  . metoprolol succinate (TOPROL-XL) 24 hr tablet 25 mg  25 mg Oral Daily Evans Lance, MD   25 mg at 07/05/19 0849  . ondansetron (ZOFRAN) tablet 4 mg  4 mg Oral Q6H PRN [name], MD       Or  . ondansetron [facility]) injection 4 mg  4 mg Intravenous Q6H PRN [name], MD      . polyvinyl alcohol (LIQUIFILM TEARS) 1.4 % ophthalmic solution 1 drop  1 drop Both Eyes QHS Absher, Julieta Bellini, RPH   1 drop at 07/04/19 2118  . potassium chloride SA (KLOR-CON) CR tablet 20 mEq  20 mEq Oral Daily [name], MD   20 mEq at 07/05/19 0850  . vitamin B-12 (CYANOCOBALAMIN) tablet 500 mcg  500 mcg Oral q morning - 10a [name], MD   500 mcg at 07/05/19 [id number]     Discharge Medications: Please see discharge summary for a list of discharge medications.  Relevant Imaging Results:  Relevant Lab Results:   Additional Information ss[id number] [place]  Inika [place]anger, Juliann Pulse, [place]

## 2019-07-06 ENCOUNTER — Inpatient Hospital Stay (HOSPITAL_COMMUNITY): Payer: Medicare Other

## 2019-07-06 LAB — BASIC METABOLIC PANEL
Anion gap: 11 (ref 5–15)
BUN: 49 mg/dL — ABNORMAL HIGH (ref 8–23)
CO2: 26 mmol/L (ref 22–32)
Calcium: 9 mg/dL (ref 8.9–10.3)
Chloride: 103 mmol/L (ref 98–111)
Creatinine, Ser: 1.54 mg/dL — ABNORMAL HIGH (ref 0.61–1.24)
GFR calc Af Amer: 44 mL/min — ABNORMAL LOW (ref >60)
GFR calc non Af Amer: 38 mL/min — ABNORMAL LOW (ref >60)
Glucose, Bld: 118 mg/dL — ABNORMAL HIGH (ref 70–99)
Potassium: 3.7 mmol/L (ref 3.5–5.1)
Sodium: 140 mmol/L (ref 135–145)

## 2019-07-06 LAB — CBC
HCT: 28.1 % — ABNORMAL LOW (ref 39.0–52.0)
Hemoglobin: 9 g/dL — ABNORMAL LOW (ref 13.0–17.0)
MCH: 30.4 pg (ref 26.0–34.0)
MCHC: 32 g/dL (ref 30.0–36.0)
MCV: 94.9 fL (ref 80.0–100.0)
Platelets: 325 10*3/uL (ref 150–400)
RBC: 2.96 MIL/uL — ABNORMAL LOW (ref 4.22–5.81)
RDW: 13.6 % (ref 11.5–15.5)
WBC: 13.7 10*3/uL — ABNORMAL HIGH (ref 4.0–10.5)
nRBC: 0 % (ref 0.0–0.2)

## 2019-07-06 LAB — URINE CULTURE
Culture: NO GROWTH
Special Requests: NORMAL

## 2019-07-06 MED ORDER — QUETIAPINE FUMARATE 25 MG PO TABS
25.0000 mg | ORAL_TABLET | Freq: Every day | ORAL | Status: DC
  Administered 2019-07-06: 25 mg via ORAL
  Filled 2019-07-06: qty 1

## 2019-07-06 NOTE — Progress Notes (Signed)
PROGRESS NOTE    [name]  [id number] DOB: [birth year] DOA: 06/26/2019 PCP: Crist Infante, MD   Brief Narrative: Cody Mendoza is a 86 y.o. male with a history of atrial fibrillation, hypertension, mitral valve insufficiency, GERD, history of Gilbertsyndrome, history of TIA. Patient presented secondary to scrotal pain, leg swelling and dyspnea in the setting of acute heart failure. Patient started on IV lasix diuresis with improvement of symptoms.   Assessment & Plan:   Principal Problem:   Acute CHF (congestive heart failure) (HCC) Active Problems:   Hypertension   Atrial fibrillation (HCC)   Discitis of thoracic region   CKD (chronic kidney disease) stage 2, GFR 60-89 ml/min   TIA (transient ischemic attack)   Acute on chronic diastolic heart failure Right sided heart failure Patient started on IV diuresis. Current management per cardiology. Concern exacerbation is secondary to increased salt intake. Some rales on exam today -Cardiology recommendations: signed off -BMP this afternoon  Dyspnea Improved with treatment of heart failure  Chronic atrial fibrillation Coreg changed to metoprolol -Continue Metoprolol and Eliquis (dose adjusted secondary to renal function)  Essential hypertension -Continue metoprolol and amlodipine  T8-9 discitis Previously on doxycycline which is now discontinued per ID recommendations  Confusion Acute. Per son, this is the second time he has had an a confusion spell. CT scan of the head obtained at that time which was not significant for acute process. No focal neurological deficit currently. Urinalysis and urine culture unremarkable. MRI unable to be obtained. Likely delirium -CT head -Seroquel 25 mg qHS  Spinal stenosis Bed bound and uses a wheelchair at baseline.  Chronic normocytic anemia Stable.  Leukocytosis Elevated and stable. No evidence of infection  Hypokalemia Replete as  needed  Hypomagnesemia Replete as needed  AKI on CKD stage IIIa AKI secondary to diuresis. Improving with holding Lasix initially with slight worsening. Stable.  Abnormal thyroid labs Difficult to interpret. Would suggest possible central hyperthyroidism. Will discuss with endocrinology.  DVT prophylaxis: Eliquis Code Status:   Code Status: DNR Family Communication: Son on telephone Disposition Plan: Discharge to SNF once confusion resolved and kidney function is stable.   Consultants:   Cardiology  Procedures:   TRANSTHORACIC ECHOCARDIOGRAM (06/27/19) IMPRESSIONS    1. Left ventricular ejection fraction, by estimation, is 55 to 60%. The  left ventricle has normal function. The left ventricle has no regional  wall motion abnormalities. Left ventricular diastolic parameters are  indeterminate.  2. Right ventricular systolic function is mildly reduced. The right  ventricular size is normal. There is mildly elevated pulmonary artery  systolic pressure. The estimated right ventricular systolic pressure is  [id number] mmHg.  3. Left atrial size was moderately dilated.  4. Right atrial size was mildly dilated.  5. The mitral valve is normal in structure. Trivial mitral valve  regurgitation. No evidence of mitral stenosis.  6. The aortic valve is tricuspid. Aortic valve regurgitation is not  visualized. Mild aortic valve sclerosis is present, with no evidence of  aortic valve stenosis.  7. The inferior vena cava is dilated in size with >50% respiratory  variability, suggesting right atrial pressure of 8 mmHg.  8. The patient is in atrial fibrillation.   Antimicrobials:  None    Subjective: No concerns.  Objective: Vitals:   07/05/19 2200 07/06/19 0500 07/06/19 0745 07/06/19 0748  BP: (!) 118/55     Pulse: 73     Resp: 17     Temp: 98.4 F (36.9 C)  TempSrc: Oral     SpO2: 95%  94% 94%  Weight:  70.1 kg    Height:        Intake/Output Summary (Last 24  hours) at 07/06/2019 1133 Last data filed at 07/06/2019 0937 Gross per 24 hour  Intake 300 ml  Output 1126 ml  Net -826 ml   Filed Weights   07/04/19 0452 07/05/19 0421 07/06/19 0500  Weight: 72.4 kg 71.8 kg 70.1 kg    Examination:  General exam: Appears calm and comfortable Respiratory system: Clear to auscultation. Respiratory effort normal. Cardiovascular system: S1 & S2 heard, RRR. No murmurs, rubs, gallops or clicks. Gastrointestinal system: Abdomen is nondistended, soft and nontender. No organomegaly or masses felt. Normal bowel sounds heard. Central nervous system: Alert and oriented to person and time. No focal neurological deficits. Extremities: No edema. No calf tenderness Skin: No cyanosis. No rashes Psychiatry: Judgement and insight appear impaired. Mood & affect appropriate.     Data Reviewed: I have personally reviewed following labs and imaging studies  CBC: Recent Labs  Lab 06/30/19 0522 07/01/19 0521 07/02/19 0510 07/03/19 0457 07/06/19 0522  WBC 12.6* 13.3* 13.9* 12.7* 13.7*  NEUTROABS 8.9* 9.9* 10.6* 9.9*  --   HGB 9.0* 8.9* 8.7* 8.5* 9.0*  HCT 28.3* 28.2* 27.3* 26.5* 28.1*  MCV 94.6 94.9 95.5 95.3 94.9  PLT 248 269 237 266 [id number]   Basic Metabolic Panel: Recent Labs  Lab 06/30/19 0522 06/30/19 0522 07/01/19 0521 07/01/19 0521 07/02/19 0510 07/03/19 0457 07/04/19 0510 07/05/19 1646 07/06/19 0522  NA 136   < > 133*   < > 134* 136 134* 138 140  K 3.8   < > 3.7   < > 3.7 3.9 3.8 4.3 3.7  CL 96*   < > 94*   < > 98 100 100 102 103  CO2 31   < > 29   < > 28 27 25 26 26   GLUCOSE 118*   < > 117*   < > 120* 115* 112* 128* 118*  BUN 50*   < > 56*   < > 56* 55* 54* 54* 49*  CREATININE 1.78*   < > 2.15*   < > 1.73* 1.58* 1.62* 1.33* 1.54*  CALCIUM 8.6*   < > 8.7*   < > 8.5* 8.6* 8.5* 8.7* 9.0  MG 1.7  --  1.9  --  2.0 1.9  --   --   --   PHOS 4.0  --  4.0  --  3.8 3.3  --   --   --    < > = values in this interval not displayed.   GFR: Estimated  Creatinine Clearance: 26.8 mL/min (A) (by C-G formula based on SCr of 1.54 mg/dL (H)). Liver Function Tests: Recent Labs  Lab 06/30/19 0522 07/01/19 0521 07/02/19 0510 07/03/19 0457  AST 21 22 25 29   ALT 18 18 20 24   ALKPHOS 107 98 102 102  BILITOT 1.7* 1.4* 1.6* 1.2  PROT 6.7 6.6 6.5 6.4*  ALBUMIN 3.0* 2.9* 2.8* 2.8*   No results for input(s): LIPASE, AMYLASE in the last 168 hours. No results for input(s): AMMONIA in the last 168 hours. Coagulation Profile: No results for input(s): INR, PROTIME in the last 168 hours. Cardiac Enzymes: No results for input(s): CKTOTAL, CKMB, CKMBINDEX, TROPONINI in the last 168 hours. BNP (last 3 results) No results for input(s): PROBNP in the last 8760 hours. HbA1C: No results for input(s): HGBA1C in the last 72 hours.  CBG: No results for input(s): GLUCAP in the last 168 hours. Lipid Profile: No results for input(s): CHOL, HDL, LDLCALC, TRIG, CHOLHDL, LDLDIRECT in the last 72 hours. Thyroid Function Tests: No results for input(s): TSH, T4TOTAL, FREET4, T3FREE, THYROIDAB in the last 72 hours. Anemia Panel: No results for input(s): VITAMINB12, FOLATE, FERRITIN, TIBC, IRON, RETICCTPCT in the last 72 hours. Sepsis Labs: No results for input(s): PROCALCITON, LATICACIDVEN in the last 168 hours.  Recent Results (from the past 240 hour(s))  Respiratory Panel by RT PCR (Flu A&B, Covid) - Nasopharyngeal Swab     Status: None   Collection Time: 06/27/19 12:37 AM   Specimen: Nasopharyngeal Swab  Result Value Ref Range Status   SARS Coronavirus 2 by RT PCR NEGATIVE NEGATIVE Final    Comment: (NOTE) SARS-CoV-2 target nucleic acids are NOT DETECTED. The SARS-CoV-2 RNA is generally detectable in upper respiratoy specimens during the acute phase of infection. The lowest concentration of SARS-CoV-2 viral copies this assay can detect is 131 copies/mL. A negative result does not preclude SARS-Cov-2 infection and should not be used as the sole basis for  treatment or other patient management decisions. A negative result may occur with  improper specimen collection/handling, submission of specimen other than nasopharyngeal swab, presence of viral mutation(s) within the areas targeted by this assay, and inadequate number of viral copies (<131 copies/mL). A negative result must be combined with clinical observations, patient history, and epidemiological information. The expected result is Negative. Fact Sheet for Patients:  PinkCheek.be Fact Sheet for Healthcare Providers:  GravelBags.it This test is not yet ap proved or cleared by the [place] FDA and  has been authorized for detection and/or diagnosis of SARS-CoV-2 by FDA under an Emergency Use Authorization (EUA). This EUA will remain  in effect (meaning this test can be used) for the duration of the COVID-19 declaration under Section 564(b)(1) of the Act, 21 U.S.C. section 360bbb-3(b)(1), unless the authorization is terminated or revoked sooner.    Influenza A by PCR NEGATIVE NEGATIVE Final   Influenza B by PCR NEGATIVE NEGATIVE Final    Comment: (NOTE) The Xpert Xpress SARS-CoV-2/FLU/RSV assay is intended as an aid in  the diagnosis of influenza from Nasopharyngeal swab specimens and  should not be used as a sole basis for treatment. Nasal washings and  aspirates are unacceptable for Xpert Xpress SARS-CoV-2/FLU/RSV  testing. Fact Sheet for Patients: PinkCheek.be Fact Sheet for Healthcare Providers: GravelBags.it This test is not yet approved or cleared by the [place] FDA and  has been authorized for detection and/or diagnosis of SARS-CoV-2 by  FDA under an Emergency Use Authorization (EUA). This EUA will remain  in effect (meaning this test can be used) for the duration of the  Covid-19 declaration under Section 564(b)(1) of the Act, 21  U.S.C. section  360bbb-3(b)(1), unless the authorization is  terminated or revoked. Performed at [facility], [place] [address], [place], [place] [address]   Culture, blood (routine x 2)     Status: None (Preliminary result)   Collection Time: 07/02/19 10:39 AM   Specimen: BLOOD  Result Value Ref Range Status   Specimen Description   Final    BLOOD LEFT ARM Performed at [place] [address] [place], [place] [address]    Special Requests   Final    BOTTLES DRAWN AEROBIC AND ANAEROBIC Blood Culture adequate volume Performed at [place] [address] [place], [place] [address]    Culture  Final    NO GROWTH 4 DAYS Performed at [facility], [place] [address] [place], [place] [address]    Report Status PENDING  Incomplete  Culture, blood (routine x 2)     Status: None (Preliminary result)   Collection Time: 07/02/19 10:41 AM   Specimen: BLOOD  Result Value Ref Range Status   Specimen Description   Final    BLOOD RIGHT ARM Performed at [facility] [address], [place], [place] [address]    Special Requests   Final    BOTTLES DRAWN AEROBIC AND ANAEROBIC Blood Culture adequate volume Performed at [place] [address], [place], [place] [address]    Culture   Final    NO GROWTH 4 DAYS Performed at [facility], [place] [address] [place], [place] [address]    Report Status PENDING  Incomplete  Culture, Urine     Status: Abnormal   Collection Time: 07/02/19  3:46 PM   Specimen: Urine, Clean Catch  Result Value Ref Range Status   Specimen Description   Final    URINE, CLEAN CATCH Performed at [facility], [place] [address], [place], [place] [address]    Special Requests   Final    NONE Performed at [facility], [place] [address] [place], [place] [address]    Culture (A)  Final    <10,000 COLONIES/mL INSIGNIFICANT  GROWTH Performed at [place] [address], [place], [place] [address]    Report Status 07/03/2019 FINAL  Final  SARS CORONAVIRUS 2 (TAT 6-24 HRS) Nasopharyngeal Nasopharyngeal Swab     Status: None   Collection Time: 07/03/19  9:49 PM   Specimen: Nasopharyngeal Swab  Result Value Ref Range Status   SARS Coronavirus 2 NEGATIVE NEGATIVE Final    Comment: (NOTE) SARS-CoV-2 target nucleic acids are NOT DETECTED. The SARS-CoV-2 RNA is generally detectable in upper and lower respiratory specimens during the acute phase of infection. Negative results do not preclude SARS-CoV-2 infection, do not rule out co-infections with other pathogens, and should not be used as the sole basis for treatment or other patient management decisions. Negative results must be combined with clinical observations, patient history, and epidemiological information. The expected result is Negative. Fact Sheet for Patients: SugarRoll.be Fact Sheet for Healthcare Providers: https://www.woods-mathews.com/ This test is not yet approved or cleared by the [place] FDA and  has been authorized for detection and/or diagnosis of SARS-CoV-2 by FDA under an Emergency Use Authorization (EUA). This EUA will remain  in effect (meaning this test can be used) for the duration of the COVID-19 declaration under Section 56 4(b)(1) of the Act, 21 U.S.C. section 360bbb-3(b)(1), unless the authorization is terminated or revoked sooner. Performed at [facility], [place] [address], [place], [place] [address]   Culture, Urine     Status: None   Collection Time: 07/05/19  1:52 PM   Specimen: Urine, Clean Catch  Result Value Ref Range Status   Specimen Description   Final    URINE, CLEAN CATCH Performed at [facility], [place] [address], [place], [place] [address]    Special Requests   Final    Normal Performed at [facility], [place]  [address] [place],  [address]    Culture   Final    NO GROWTH Performed at [facility], [place] [address], [place],  [address]    Report Status  07/06/2019 FINAL  Final         Radiology Studies: DG CHEST PORT 1 VIEW  Result Date: 07/05/2019 CLINICAL DATA:  Abdominal distension EXAM: PORTABLE CHEST 1 VIEW COMPARISON:  07/03/2019 chest radiograph. FINDINGS: Stable cardiomediastinal silhouette with mild cardiomegaly. No pneumothorax. No right pleural effusion. No significant left pleural effusion, noting exclusion of a small portion of the left costophrenic angle. Cephalization of the pulmonary vasculature without overt pulmonary edema. Stable mild hazy left lung base opacity, favor atelectasis. IMPRESSION: 1. Stable mild cardiomegaly without overt pulmonary edema. 2. Stable mild hazy left lung base opacity, favor atelectasis. Electronically Signed   By: Ilona Sorrel M.D.   On: 07/05/2019 11:45   DG Abd Portable 1V  Result Date: 07/05/2019 CLINICAL DATA:  Abdominal distension EXAM: PORTABLE ABDOMEN - 1 VIEW COMPARISON:  None. FINDINGS: No dilated large or small bowel. Gas-filled loops of nondistended bowel are present. No pathologic calcifications. No organomegaly. IMPRESSION: No bowel obstruction. Electronically Signed   By: Suzy Bouchard M.D.   On: 07/05/2019 11:48        Scheduled Meds: . amLODipine  5 mg Oral QPM  . apixaban  2.5 mg Oral BID  . diclofenac Sodium  2 g Topical QID  . doxazosin  2 mg Oral Daily  . guaiFENesin  1,200 mg Oral BID  . ipratropium  0.5 mg Nebulization TID  . iron polysaccharides  150 mg Oral Daily  . latanoprost  1 drop Both Eyes QHS  . levalbuterol  0.63 mg Nebulization TID  . metoprolol succinate  25 mg Oral Daily  . polyvinyl alcohol  1 drop Both Eyes QHS  . potassium chloride  20 mEq Oral Daily  . vitamin B-12  500 mcg Oral q morning - 10a   Continuous Infusions:   LOS: 10 days     Cordelia Poche, MD [facility] 07/06/2019, 11:33 AM  If 7PM-7AM, please contact night-coverage [email or web address]

## 2019-07-07 LAB — BASIC METABOLIC PANEL
Anion gap: 6 (ref 5–15)
BUN: 43 mg/dL — ABNORMAL HIGH (ref 8–23)
CO2: 27 mmol/L (ref 22–32)
Calcium: 8.5 mg/dL — ABNORMAL LOW (ref 8.9–10.3)
Chloride: 105 mmol/L (ref 98–111)
Creatinine, Ser: 1.21 mg/dL (ref 0.61–1.24)
GFR calc Af Amer: 59 mL/min — ABNORMAL LOW (ref >60)
GFR calc non Af Amer: 51 mL/min — ABNORMAL LOW (ref >60)
Glucose, Bld: 107 mg/dL — ABNORMAL HIGH (ref 70–99)
Potassium: 3.6 mmol/L (ref 3.5–5.1)
Sodium: 138 mmol/L (ref 135–145)

## 2019-07-07 LAB — RESPIRATORY PANEL BY RT PCR (FLU A&B, COVID)
Influenza A by PCR: NEGATIVE
Influenza B by PCR: NEGATIVE
SARS Coronavirus 2 by RT PCR: NEGATIVE

## 2019-07-07 LAB — T4, FREE: Free T4: 1.35 ng/dL — ABNORMAL HIGH (ref 0.61–1.12)

## 2019-07-07 LAB — CULTURE, BLOOD (ROUTINE X 2)
Culture: NO GROWTH
Culture: NO GROWTH
Special Requests: ADEQUATE
Special Requests: ADEQUATE

## 2019-07-07 LAB — AMMONIA: Ammonia: 18 umol/L (ref 9–35)

## 2019-07-07 LAB — TSH: TSH: 3.397 u[IU]/mL (ref 0.350–4.500)

## 2019-07-07 MED ORDER — LEVALBUTEROL HCL 0.63 MG/3ML IN NEBU: 0.6300 mg | INHALATION_SOLUTION | Freq: Four times a day (QID) | RESPIRATORY_TRACT | Status: DC | PRN

## 2019-07-07 MED ORDER — QUETIAPINE FUMARATE 25 MG PO TABS: 25.0000 mg | ORAL_TABLET | Freq: Every day | ORAL | Status: DC

## 2019-07-07 MED ORDER — METOPROLOL SUCCINATE ER 25 MG PO TB24: 25.0000 mg | ORAL_TABLET | Freq: Every day | ORAL | Status: DC

## 2019-07-07 NOTE — Plan of Care (Signed)
  Problem: Health Behavior/Discharge Planning: Goal: Ability to manage health-related needs will improve Outcome: Adequate for Discharge   Problem: Clinical Measurements: Goal: Ability to maintain clinical measurements within normal limits will improve Outcome: Adequate for Discharge Goal: Diagnostic test results will improve Outcome: Adequate for Discharge Goal: Respiratory complications will improve Outcome: Adequate for Discharge Goal: Cardiovascular complication will be avoided Outcome: Adequate for Discharge   Problem: Activity: Goal: Risk for activity intolerance will decrease Outcome: Adequate for Discharge   Problem: Nutrition: Goal: Adequate nutrition will be maintained Outcome: Adequate for Discharge   Problem: Safety: Goal: Ability to remain free from injury will improve Outcome: Adequate for Discharge   Problem: Education: Goal: Ability to demonstrate management of disease process will improve Outcome: Adequate for Discharge Goal: Ability to verbalize understanding of medication therapies will improve Outcome: Adequate for Discharge   Problem: Activity: Goal: Capacity to carry out activities will improve Outcome: Adequate for Discharge   Problem: Cardiac: Goal: Ability to achieve and maintain adequate cardiopulmonary perfusion will improve Outcome: Adequate for Discharge

## 2019-07-07 NOTE — Progress Notes (Signed)
IV and telemetry monitor removed, placed in paper gown for PTAR transport, personal belongings packed in room

## 2019-07-07 NOTE — Discharge Summary (Signed)
Physician Discharge Summary  Cody Mendoza [id number] DOB: [birth year] DOA: 06/26/2019  PCP: Crist Infante, MD  Admit date: 06/26/2019 Discharge date: 07/07/2019  Admitted From: [facility] Disposition: SNF  Recommendations for Outpatient Follow-up:  1. Follow up with PCP in 1 week 2. Patient will need to have fluid status followed carefully in addition to creatinine. Not discharge on Lasix secondary to improved fluid status and worsened creatinine on diuresis. Recommend watching lung exam carefully 3. Please obtain BMP/CBC in one week 4. Please follow up on the following pending results: None   Discharge Condition: Stable CODE STATUS: DNR Diet recommendation: Heart healthy   Brief/Interim Summary:  Admission HPI written by [name], MD   Chief Complaint: Increasing lower extremity edema and shortness of breath.  HPI: Cody Mendoza is a 86 y.o. male with history of A. 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  Denies any chest pain productive cough fever or chills.   Hospital course:  Acute on chronic diastolic heart failure Right sided heart failure Patient started on IV diuresis. Current management per cardiology. Concern exacerbation is secondary to increased salt intake. Diuresed with Lasix. Per cardiology, recommendation to discharge without lasix at this time secondary to creatinine elevation on diuresis.  Dyspnea Resolved with treatment of heart failure  Chronic atrial fibrillation Coreg changed to metoprolol. 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 Per son, this is the second time he has had an a confusion spell. CT scan of the head obtained at that time which was not significant for acute process. No focal neurological deficit currently. Urinalysis and urine culture unremarkable. MRI unable to be obtained. Likely delirium. Repeat CT head stable. Improvement to baseline at time of discharge. Discharge on Seroquel 25 mg qHS but recommend attempting to discontinue this medication if able if patient remains stable.  Spinal stenosis Bed bound and uses a wheelchair at baseline.  Chronic normocytic anemia Stable.  Leukocytosis Elevated and stable. No evidence of infection  Hypokalemia Replete as needed  Hypomagnesemia Replete as needed  AKI on CKD stage IIIa AKI secondary to diuresis. Improving with holding Lasix initially with slight worsening. Stable.  Abnormal thyroid labs Difficult to interpret. Would suggest possible central hyperthyroidism. Will discuss with endocrinology.  TIA noted on chart. Evidence for TIA not available. Symptoms likely related to delirium.  Discharge Diagnoses:  Principal Problem:   Acute CHF (congestive heart failure) (HCC) Active Problems:   Hypertension   Atrial fibrillation (HCC)   Discitis of thoracic region   CKD (chronic kidney disease) stage 2, GFR 60-89 ml/min    Discharge Instructions   Allergies as of 07/07/2019   No Known Allergies     Medication List    STOP taking these medications   carvedilol 3.125 MG tablet Commonly known as: COREG   doxycycline 100 MG tablet Commonly known as: VIBRA-TABS   losartan 100 MG tablet Commonly known as: COZAAR     TAKE these medications   amLODipine 5 MG tablet Commonly known as: NORVASC Take 1 tablet (5 mg total) by mouth daily. What changed: when to take this   apixaban 5 MG Tabs tablet Commonly known  as: Eliquis Take 1 tablet (5 mg total) by mouth 2 (two) times daily.   doxazosin 4  MG tablet Commonly known as: CARDURA Take 0.5 tablets (2 mg total) by mouth daily.   ICAPS AREDS FORMULA PO Take 1 capsule by mouth 2 (two) times daily with breakfast and lunch.   latanoprost 0.005 % ophthalmic solution Commonly known as: XALATAN Place 1 drop into both eyes at bedtime.   levalbuterol 0.63 MG/3ML nebulizer solution Commonly known as: XOPENEX Take 3 mLs (0.63 mg total) by nebulization every 6 (six) hours as needed for wheezing or shortness of breath.   metoprolol succinate 25 MG 24 hr tablet Commonly known as: TOPROL-XL Take 1 tablet (25 mg total) by mouth daily. Start taking on: Jul 08, 2019   multivitamin with minerals Tabs tablet Take 1 tablet by mouth daily with supper.   pantoprazole 40 MG tablet Commonly known as: PROTONIX Take 40 mg by mouth every morning.   PROBIOTIC ACIDOPHILUS PO Take 1 capsule by mouth daily with supper.   QUEtiapine 25 MG tablet Commonly known as: SEROQUEL Take 1 tablet (25 mg total) by mouth at bedtime.   Systane 0.4-0.3 % Soln Generic drug: Polyethyl Glycol-Propyl Glycol Place 1 drop into both eyes at bedtime.   Vitamin B12 500 MCG Tabs Take 500 mcg by mouth every morning.       No Known Allergies  Consultations:  Cardiology   Procedures/Studies: CT HEAD WO CONTRAST  Result Date: 07/06/2019 CLINICAL DATA:  Encephalopathy. EXAM: CT HEAD WITHOUT CONTRAST TECHNIQUE: Contiguous axial images were obtained from the base of the skull through the vertex without intravenous contrast. COMPARISON:  CT head 07/02/2019 FINDINGS: Brain: The patient was not able to hold still. Three attempts were made however all attempts are degraded by motion. Moderate atrophy. Moderate to advanced chronic microvascular ischemia in the white matter. Negative for acute infarct, hemorrhage, mass. Vascular: Negative for hyperdense vessel Skull: Negative Sinuses/Orbits: Paranasal sinuses clear. Bilateral cataract extraction Other: None IMPRESSION:  Motion degraded study Atrophy and extensive chronic microvascular ischemic change in the white matter. No acute abnormality Electronically Signed   By: Franchot Gallo M.D.   On: 07/06/2019 18:50   CT HEAD WO CONTRAST  Result Date: 07/02/2019 CLINICAL DATA:  Subacute neuro deficit. EXAM: CT HEAD WITHOUT CONTRAST TECHNIQUE: Contiguous axial images were obtained from the base of the skull through the vertex without intravenous contrast. COMPARISON:  CT head 02/21/2019 FINDINGS: Brain: Image quality degraded by motion moderate motion. Moderate atrophy and moderate chronic microvascular ischemic change in the white matter. Negative for acute infarct, hemorrhage, mass Vascular: Negative for hyperdense vessel Skull: Negative Sinuses/Orbits: Negative Other: None IMPRESSION: This examination is significantly motion degraded No acute abnormality identified Moderate atrophy and moderate chronic microvascular ischemic changes in the white matter. Electronically Signed   By: Franchot Gallo M.D.   On: 07/02/2019 12:50   DG CHEST PORT 1 VIEW  Result Date: 07/05/2019 CLINICAL DATA:  Abdominal distension EXAM: PORTABLE CHEST 1 VIEW COMPARISON:  07/03/2019 chest radiograph. FINDINGS: Stable cardiomediastinal silhouette with mild cardiomegaly. No pneumothorax. No right pleural effusion. No significant left pleural effusion, noting exclusion of a small portion of the left costophrenic angle. Cephalization of the pulmonary vasculature without overt pulmonary edema. Stable mild hazy left lung base opacity, favor atelectasis. IMPRESSION: 1. Stable mild cardiomegaly without overt pulmonary edema. 2. Stable mild hazy left lung base opacity, favor atelectasis. Electronically Signed   By: Ilona Sorrel M.D.   On: 07/05/2019 11:45  DG CHEST PORT 1 VIEW  Result Date: 07/03/2019 CLINICAL DATA:  Shortness of breath EXAM: PORTABLE CHEST 1 VIEW COMPARISON:  One day prior FINDINGS: Numerous leads and wires project over the chest. Midline  trachea. Mild cardiomegaly. Atherosclerosis in the transverse aorta. No pleural effusion or pneumothorax. Improved interstitial edema with mild pulmonary venous congestion remaining. Improved left base airspace disease. IMPRESSION: Improved aeration, improved to resolved interstitial edema. Mild pulmonary venous congestion remains. Improved left base Airspace disease, likely atelectasis. Electronically Signed   By: Abigail Miyamoto M.D.   On: 07/03/2019 09:08   DG CHEST PORT 1 VIEW  Result Date: 07/02/2019 CLINICAL DATA:  Short of breath EXAM: PORTABLE CHEST 1 VIEW COMPARISON:  07/01/2019 FINDINGS: Cardiac enlargement. Progression of mild vascular congestion in mild interstitial airspace disease on the right. No significant pleural effusion. Atherosclerotic aortic arch. IMPRESSION: Progression of vascular congestion and mild interstitial edema on the right. Electronically Signed   By: Franchot Gallo M.D.   On: 07/02/2019 09:13   DG CHEST PORT 1 VIEW  Result Date: 07/01/2019 CLINICAL DATA:  Shortness of breath EXAM: PORTABLE CHEST 1 VIEW COMPARISON:  Yesterday FINDINGS: Cardiomegaly and vascular pedicle widening. Mild prominence of lung markings at the bases is unchanged over multiple studies, chronic appearing. No edema, effusion, or pneumothorax. IMPRESSION: Cardiomegaly without failure. Electronically Signed   By: Monte Fantasia M.D.   On: 07/01/2019 07:02   DG CHEST PORT 1 VIEW  Result Date: 06/30/2019 CLINICAL DATA:  Shortness of breath. EXAM: PORTABLE CHEST 1 VIEW COMPARISON:  June 29, 2019 FINDINGS: Cardiomegaly and mild pulmonary edema are stable. Probable small effusion on the left. No other interval changes. IMPRESSION: Stable cardiomegaly, small left effusion, and pulmonary edema. Electronically Signed   By: Dorise Bullion III M.D   On: 06/30/2019 11:19   DG CHEST PORT 1 VIEW  Result Date: 06/29/2019 CLINICAL DATA:  86 year old male with history of shortness of breath. EXAM: PORTABLE CHEST 1  VIEW COMPARISON:  Chest x-ray 06/28/2019. FINDINGS: Lung volumes are low. Small left pleural effusion. Bibasilar opacities favored to reflect areas of subsegmental atelectasis. No definite consolidative airspace disease. Cephalization of the pulmonary vasculature with some indistinct interstitial markings, indicative of mild interstitial pulmonary edema. Moderate cardiomegaly. Upper mediastinal contours are distorted by patient's rotation to the right. Aortic atherosclerosis. IMPRESSION: 1. The appearance of the chest suggests mild congestive heart failure, similar to the prior study, as above. 2. Aortic atherosclerosis. Electronically Signed   By: Vinnie Langton M.D.   On: 06/29/2019 07:52   DG CHEST PORT 1 VIEW  Result Date: 06/28/2019 CLINICAL DATA:  86 year old male with shortness of breath. Lower extremity edema. EXAM: PORTABLE CHEST 1 VIEW COMPARISON:  Portable chest 06/26/2019 and earlier. FINDINGS: Portable AP semi upright view at 0558 hours. Stable cardiomegaly and mediastinal contours. Stable lung volumes. Bilateral pulmonary interstitial opacity and mild bibasilar veiling opacity is stable. No superimposed pneumothorax. No consolidation identified. Paucity of bowel gas in the upper abdomen. Visualized tracheal air column is within normal limits. No acute osseous abnormality identified. IMPRESSION: Cardiomegaly with suspected pulmonary interstitial edema and small pleural effusions. Stable ventilation since 06/26/2019. Electronically Signed   By: Genevie Ann M.D.   On: 06/28/2019 07:14   DG Chest Port 1 View  Result Date: 06/26/2019 CLINICAL DATA:  Shortness of breath, wheezing EXAM: PORTABLE CHEST 1 VIEW COMPARISON:  02/21/2019 FINDINGS: Cardiomegaly, vascular congestion. Probable layering bilateral effusions with bibasilar atelectasis or infiltrates. No overt edema. No acute bony abnormality. Aortic atherosclerosis. IMPRESSION:  Cardiomegaly with vascular congestion. Layering bilateral effusions  with bibasilar atelectasis or infiltrates. Electronically Signed   By: Rolm Baptise M.D.   On: 06/26/2019 21:46   DG Abd Portable 1V  Result Date: 07/05/2019 CLINICAL DATA:  Abdominal distension EXAM: PORTABLE ABDOMEN - 1 VIEW COMPARISON:  None. FINDINGS: No dilated large or small bowel. Gas-filled loops of nondistended bowel are present. No pathologic calcifications. No organomegaly. IMPRESSION: No bowel obstruction. Electronically Signed   By: Suzy Bouchard M.D.   On: 07/05/2019 11:48   ECHOCARDIOGRAM COMPLETE  Result Date: 06/27/2019    ECHOCARDIOGRAM REPORT   Patient Name:   Cody Mendoza Date of Exam: 06/27/2019 Medical Rec #:  [id number]          Height:       67.0 in Accession #:    [id number]         Weight:       145.1 lb Date of Birth:  [birth year]          BSA:          1.764 m Patient Age:    86 years           BP:           122/97 mmHg Patient Gender: M                  HR:           75 bpm. Exam Location:  Inpatient Procedure: 2D Echo Indications:    Congestive Heart Failure 428.0 / I50.9  History:        Patient has prior history of Echocardiogram examinations, most                 recent 08/26/2018. CHF, TIA; Risk Factors:Hypertension. CKD.  Sonographer:    Vikki Ports Turrentine Referring Phys: [place]  1. Left ventricular ejection fraction, by estimation, is 55 to 60%. The left ventricle has normal function. The left ventricle has no regional wall motion abnormalities. Left ventricular diastolic parameters are indeterminate.  2. Right ventricular systolic function is mildly reduced. The right ventricular size is normal. There is mildly elevated pulmonary artery systolic pressure. The estimated right ventricular systolic pressure is [id number] mmHg.  3. Left atrial size was moderately dilated.  4. Right atrial size was mildly dilated.  5. The mitral valve is normal in structure. Trivial mitral valve regurgitation. No evidence of mitral stenosis.  6. The aortic valve is  tricuspid. Aortic valve regurgitation is not visualized. Mild aortic valve sclerosis is present, with no evidence of aortic valve stenosis.  7. The inferior vena cava is dilated in size with >50% respiratory variability, suggesting right atrial pressure of 8 mmHg.  8. The patient is in atrial fibrillation. FINDINGS  Left Ventricle: Left ventricular ejection fraction, by estimation, is 55 to 60%. The left ventricle has normal function. The left ventricle has no regional wall motion abnormalities. The left ventricular internal cavity size was normal in size. There is  no left ventricular hypertrophy. Left ventricular diastolic parameters are indeterminate. Right Ventricle: The right ventricular size is normal. No increase in right ventricular wall thickness. Right ventricular systolic function is mildly reduced. There is mildly elevated pulmonary artery systolic pressure. The tricuspid regurgitant velocity  is 2.43 m/s, and with an assumed right atrial pressure of 8 mmHg, the estimated right ventricular systolic pressure is [id number] mmHg. Left Atrium: Left atrial size was moderately dilated. Right Atrium: Right atrial size was mildly dilated.  Pericardium: Trivial pericardial effusion is present. Mitral Valve: The mitral valve is normal in structure. Trivial mitral valve regurgitation. No evidence of mitral valve stenosis. Tricuspid Valve: The tricuspid valve is normal in structure. Tricuspid valve regurgitation is trivial. Aortic Valve: The aortic valve is tricuspid. Aortic valve regurgitation is not visualized. Mild aortic valve sclerosis is present, with no evidence of aortic valve stenosis. Pulmonic Valve: The pulmonic valve was normal in structure. Pulmonic valve regurgitation is not visualized. Aorta: The aortic root is normal in size and structure. Venous: The inferior vena cava is dilated in size with greater than 50% respiratory variability, suggesting right atrial pressure of 8 mmHg. IAS/Shunts: No atrial level  shunt detected by color flow Doppler.  LEFT VENTRICLE PLAX 2D LVIDd:         5.00 cm LVIDs:         3.63 cm LV PW:         0.92 cm LV IVS:        0.96 cm LVOT diam:     2.00 cm LV SV:         64 LV SV Index:   36 LVOT Area:     3.14 cm  RIGHT VENTRICLE RV S prime:     6.85 cm/s LEFT ATRIUM             Index       RIGHT ATRIUM           Index LA diam:        4.30 cm 2.44 cm/m  RA Area:     21.70 cm LA Vol (A2C):   98.0 ml 55.55 ml/m RA Volume:   60.50 ml  34.29 ml/m LA Vol (A4C):   63.0 ml 35.71 ml/m LA Biplane Vol: 82.2 ml 46.59 ml/m  AORTIC VALVE LVOT Vmax:   111.93 cm/s LVOT Vmean:  79.000 cm/s LVOT VTI:    0.202 m  AORTA Ao Root diam: 3.10 cm MITRAL VALVE                TRICUSPID VALVE MV Area (PHT): 3.48 cm     TR Peak grad:   23.6 mmHg MV Decel Time: 218 msec     TR Vmax:        243.00 cm/s MV E velocity: 108.00 cm/s MV A velocity: 51.40 cm/s   SHUNTS MV E/A ratio:  2.10         Systemic VTI:  0.20 m                             Systemic Diam: 2.00 cm Loralie Champagne MD Electronically signed by Loralie Champagne MD Signature Date/Time: 06/27/2019/4:31:31 PM    Final    VAS [place] LOWER EXTREMITY VENOUS (DVT)  Result Date: 06/27/2019  Lower Venous DVTStudy Indications: Edema.  Comparison Study: no prior Performing Technologist: [name] RVS  Examination Guidelines: A complete evaluation includes B-mode imaging, spectral Doppler, color Doppler, and power Doppler as needed of all accessible portions of each vessel. Bilateral testing is considered an integral part of a complete examination. Limited examinations for reoccurring indications may be performed as noted. The reflux portion of the exam is performed with the patient in reverse Trendelenburg.  +---------+---------------+---------+-----------+----------+--------------+ RIGHT    CompressibilityPhasicitySpontaneityPropertiesThrombus Aging +---------+---------------+---------+-----------+----------+--------------+ CFV      Full           Yes       Yes                                 +---------+---------------+---------+-----------+----------+--------------+  SFJ      Full                                                        +---------+---------------+---------+-----------+----------+--------------+ FV Prox  Full                                                        +---------+---------------+---------+-----------+----------+--------------+ FV Mid   Full                                                        +---------+---------------+---------+-----------+----------+--------------+ FV DistalFull                                                        +---------+---------------+---------+-----------+----------+--------------+ PFV      Full                                                        +---------+---------------+---------+-----------+----------+--------------+ POP      Full           Yes      Yes                                 +---------+---------------+---------+-----------+----------+--------------+ PTV      Full                                                        +---------+---------------+---------+-----------+----------+--------------+ PERO                                                  Not visualized +---------+---------------+---------+-----------+----------+--------------+   +---------+---------------+---------+-----------+----------+--------------+ LEFT     CompressibilityPhasicitySpontaneityPropertiesThrombus Aging +---------+---------------+---------+-----------+----------+--------------+ CFV      Full           Yes      Yes                                 +---------+---------------+---------+-----------+----------+--------------+ SFJ      Full                                                        +---------+---------------+---------+-----------+----------+--------------+  FV Prox  Full                                                         +---------+---------------+---------+-----------+----------+--------------+ FV Mid   Full                                                        +---------+---------------+---------+-----------+----------+--------------+ FV DistalFull                                                        +---------+---------------+---------+-----------+----------+--------------+ PFV      Full                                                        +---------+---------------+---------+-----------+----------+--------------+ POP      Full           Yes      Yes                                 +---------+---------------+---------+-----------+----------+--------------+ PTV      Full                                                        +---------+---------------+---------+-----------+----------+--------------+ PERO     Full                                                        +---------+---------------+---------+-----------+----------+--------------+     Summary: BILATERAL: - No evidence of deep vein thrombosis seen in the lower extremities, bilaterally.   *See table(s) above for measurements and observations. Electronically signed by Monica Martinez MD on 06/27/2019 at 8:03:31 PM.    Final       TRANSTHORACIC ECHOCARDIOGRAM (06/27/19) IMPRESSIONS    1. Left ventricular ejection fraction, by estimation, is 55 to 60%. The  left ventricle has normal function. The left ventricle has no regional  wall motion abnormalities. Left ventricular diastolic parameters are  indeterminate.  2. Right ventricular systolic function is mildly reduced. The right  ventricular size is normal. There is mildly elevated pulmonary artery  systolic pressure. The estimated right ventricular systolic pressure is  [id number] mmHg.  3. Left atrial size was moderately dilated.  4. Right atrial size was mildly dilated.  5. The mitral valve is normal in structure. Trivial mitral valve  regurgitation. No  evidence of mitral stenosis.  6. The aortic valve is tricuspid. Aortic valve regurgitation is not  visualized. Mild aortic valve sclerosis is present, with no evidence of  aortic valve stenosis.  7. The inferior vena cava is dilated in size with >50% respiratory  variability, suggesting right atrial pressure of 8 mmHg.  8. The patient is in atrial fibrillation.    Subjective: Patient states that he knew he was not thinking correctly the last few days. Feels well today. No concerns.  Discharge Exam: Vitals:   07/07/19 0746 07/07/19 0748  BP:    Pulse:    Resp:    Temp:    SpO2: 95% 95%   Vitals:   07/06/19 2114 07/07/19 0555 07/07/19 0746 07/07/19 0748  BP: (!) 119/58 136/67    Pulse: 88 91    Resp: (!) 24 18    Temp: 97.7 F (36.5 C) 98.6 F (37 C)    TempSrc:  Oral    SpO2:  98% 95% 95%  Weight:  67.6 kg    Height:        General: Pt is alert, awake, not in acute distress Cardiovascular: RRR, S1/S2 +, no rubs, no gallops Respiratory: CTA bilaterally, no wheezing, no rhonchi Abdominal: Soft, NT, ND, bowel sounds + Extremities: no edema, no cyanosis    The results of significant diagnostics from this hospitalization (including imaging, microbiology, ancillary and laboratory) are listed below for reference.     Microbiology: Recent Results (from the past 240 hour(s))  Culture, blood (routine x 2)     Status: None   Collection Time: 07/02/19 10:39 AM   Specimen: BLOOD  Result Value Ref Range Status   Specimen Description   Final    BLOOD LEFT ARM Performed at [facility] [address], [place], [place] [address]    Special Requests   Final    BOTTLES DRAWN AEROBIC AND ANAEROBIC Blood Culture adequate volume Performed at [facility] [address] [place], [place] [address]    Culture   Final    NO GROWTH 5 DAYS Performed at [facility], [place] [address] [place], [place] [address]    Report Status  07/07/2019 FINAL  Final  Culture, blood (routine x 2)     Status: None   Collection Time: 07/02/19 10:41 AM   Specimen: BLOOD  Result Value Ref Range Status   Specimen Description   Final    BLOOD RIGHT ARM Performed at [facility] [address] [place], [place] [address]    Special Requests   Final    BOTTLES DRAWN AEROBIC AND ANAEROBIC Blood Culture adequate volume Performed at [facility] [address], [place], [place] [address]    Culture   Final    NO GROWTH 5 DAYS Performed at [facility], [place] [address] [place], [place] [address]    Report Status 07/07/2019 FINAL  Final  Culture, Urine     Status: Abnormal   Collection Time: 07/02/19  3:46 PM   Specimen: Urine, Clean Catch  Result Value Ref Range Status   Specimen Description   Final    URINE, CLEAN CATCH Performed at [facility], [place] [address], [place], [place] [address]    Special Requests   Final    NONE Performed at [facility], [place] [address], [place], [place] [address]    Culture (A)  Final    <10,000 COLONIES/mL INSIGNIFICANT GROWTH Performed at [facility] [address] [place], [place] [address]  Report Status 07/03/2019 FINAL  Final  SARS CORONAVIRUS 2 (TAT 6-24 HRS) Nasopharyngeal Nasopharyngeal Swab     Status: None   Collection Time: 07/03/19  9:49 PM   Specimen: Nasopharyngeal Swab  Result Value Ref Range Status   SARS Coronavirus 2 NEGATIVE NEGATIVE Final    Comment: (NOTE) SARS-CoV-2 target nucleic acids are NOT DETECTED. The SARS-CoV-2 RNA is generally detectable in upper and lower respiratory specimens during the acute phase of infection. Negative results do not preclude SARS-CoV-2 infection, do not rule out co-infections with other pathogens, and should not be used as the sole basis for treatment or other patient management decisions. Negative results must be combined with clinical  observations, patient history, and epidemiological information. The expected result is Negative. Fact Sheet for Patients: SugarRoll.be Fact Sheet for Healthcare Providers: https://www.woods-mathews.com/ This test is not yet approved or cleared by the [place] FDA and  has been authorized for detection and/or diagnosis of SARS-CoV-2 by FDA under an Emergency Use Authorization (EUA). This EUA will remain  in effect (meaning this test can be used) for the duration of the COVID-19 declaration under Section 56 4(b)(1) of the Act, 21 U.S.C. section 360bbb-3(b)(1), unless the authorization is terminated or revoked sooner. Performed at [facility], [place] [address] [place], [place] [address]   Culture, Urine     Status: None   Collection Time: 07/05/19  1:52 PM   Specimen: Urine, Clean Catch  Result Value Ref Range Status   Specimen Description   Final    URINE, CLEAN CATCH Performed at [facility], [place] [address] [place], [place] [address]    Special Requests   Final    Normal Performed at [facility], [place] [address] [place], [place] [address]    Culture   Final    NO GROWTH Performed at [facility], [place] [address], [place],  [address]    Report Status 07/06/2019 FINAL  Final     Labs: BNP (last 3 results) Recent Labs    06/26/19 2203  BNP [id number]*   Basic Metabolic Panel: Recent Labs  Lab 07/01/19 0521 07/01/19 0521 07/02/19 0510 07/02/19 0510 07/03/19 0457 07/04/19 0510 07/05/19 1646 07/06/19 0522 07/07/19 0350  NA 133*   < > 134*   < > 136 134* 138 140 138  K 3.7   < > 3.7   < > 3.9 3.8 4.3 3.7 3.6  CL 94*   < > 98   < > 100 100 102 103 105  CO2 29   < > 28   < > 27 25 26 26 27   GLUCOSE 117*   < > 120*   < > 115* 112* 128* 118* 107*  BUN 56*   < > 56*   < > 55* 54* 54* 49* 43*  CREATININE 2.15*   < > 1.73*   < > 1.58* 1.62* 1.33* 1.54* 1.21  CALCIUM 8.7*    < > 8.5*   < > 8.6* 8.5* 8.7* 9.0 8.5*  MG 1.9  --  2.0  --  1.9  --   --   --   --   PHOS 4.0  --  3.8  --  3.3  --   --   --   --    < > = values in this interval not displayed.   Liver Function Tests: Recent Labs  Lab 07/01/19 0521 07/02/19 0510 07/03/19 0457  AST 22 25 29  ALT 18 20 24   ALKPHOS 98 102 102  BILITOT 1.4* 1.6* 1.2  PROT 6.6 6.5 6.4*  ALBUMIN 2.9* 2.8* 2.8*   No results for input(s): LIPASE, AMYLASE in the last 168 hours. Recent Labs  Lab 07/07/19 0739  AMMONIA 18   CBC: Recent Labs  Lab 07/01/19 0521 07/02/19 0510 07/03/19 0457 07/06/19 0522  WBC 13.3* 13.9* 12.7* 13.7*  NEUTROABS 9.9* 10.6* 9.9*  --   HGB 8.9* 8.7* 8.5* 9.0*  HCT 28.2* 27.3* 26.5* 28.1*  MCV 94.9 95.5 95.3 94.9  PLT 269 237 266 325   Cardiac Enzymes: No results for input(s): CKTOTAL, CKMB, CKMBINDEX, TROPONINI in the last 168 hours. BNP: Invalid input(s): POCBNP CBG: No results for input(s): GLUCAP in the last 168 hours. D-Dimer No results for input(s): DDIMER in the last 72 hours. Hgb A1c No results for input(s): HGBA1C in the last 72 hours. Lipid Profile No results for input(s): CHOL, HDL, LDLCALC, TRIG, CHOLHDL, LDLDIRECT in the last 72 hours. Thyroid function studies Recent Labs    07/07/19 0739  TSH 3.397   Anemia work up No results for input(s): VITAMINB12, FOLATE, FERRITIN, TIBC, IRON, RETICCTPCT in the last 72 hours. Urinalysis    Component Value Date/Time   COLORURINE STRAW (A) 07/05/2019 1351   APPEARANCEUR CLEAR 07/05/2019 1351   LABSPEC 1.008 07/05/2019 1351   PHURINE 5.0 07/05/2019 1351   GLUCOSEU NEGATIVE 07/05/2019 1351   HGBUR SMALL (A) 07/05/2019 1351   BILIRUBINUR NEGATIVE 07/05/2019 1351   KETONESUR NEGATIVE 07/05/2019 1351   PROTEINUR NEGATIVE 07/05/2019 1351   UROBILINOGEN 1.0 09/21/2014 1610   NITRITE NEGATIVE 07/05/2019 1351   LEUKOCYTESUR NEGATIVE 07/05/2019 1351   Sepsis Labs Invalid input(s): PROCALCITONIN,  WBC,   LACTICIDVEN Microbiology Recent Results (from the past 240 hour(s))  Culture, blood (routine x 2)     Status: None   Collection Time: 07/02/19 10:39 AM   Specimen: BLOOD  Result Value Ref Range Status   Specimen Description   Final    BLOOD LEFT ARM Performed at [facility], [place] [address], [place], [place] [address]    Special Requests   Final    BOTTLES DRAWN AEROBIC AND ANAEROBIC Blood Culture adequate volume Performed at [place] [address], [place], [place] [address]    Culture   Final    NO GROWTH 5 DAYS Performed at [facility], [place] [address], [place], [place] [address]    Report Status 07/07/2019 FINAL  Final  Culture, blood (routine x 2)     Status: None   Collection Time: 07/02/19 10:41 AM   Specimen: BLOOD  Result Value Ref Range Status   Specimen Description   Final    BLOOD RIGHT ARM Performed at [facility] [address] [place], [place] [address]    Special Requests   Final    BOTTLES DRAWN AEROBIC AND ANAEROBIC Blood Culture adequate volume Performed at [place] [address] [place], [place] [address]    Culture   Final    NO GROWTH 5 DAYS Performed at [facility], [place] [address] [place], [place] [address]    Report Status 07/07/2019 FINAL  Final  Culture, Urine     Status: Abnormal   Collection Time: 07/02/19  3:46 PM   Specimen: Urine, Clean Catch  Result Value Ref Range Status   Specimen Description   Final    URINE, CLEAN CATCH Performed at [facility], [place] [address] [place], [place] [address]    Special Requests   Final    NONE Performed at [facility], [place] [address] [place], [place] [address]    Culture (A)  Final    <10,000 COLONIES/mL INSIGNIFICANT GROWTH Performed at [place] [address], [place], [place] [address]    Report Status 07/03/2019 FINAL  Final  SARS  CORONAVIRUS 2 (TAT 6-24 HRS) Nasopharyngeal Nasopharyngeal Swab     Status: None   Collection Time: 07/03/19  9:49 PM   Specimen: Nasopharyngeal Swab  Result Value Ref Range Status   SARS Coronavirus 2 NEGATIVE NEGATIVE Final    Comment: (NOTE) SARS-CoV-2 target nucleic acids are NOT DETECTED. The SARS-CoV-2 RNA is generally detectable in upper and lower respiratory specimens during the acute phase of infection. Negative results do not preclude SARS-CoV-2 infection, do not rule out co-infections with other pathogens, and should not be used as the sole basis for treatment or other patient management decisions. Negative results must be combined with clinical observations, patient history, and epidemiological information. The expected result is Negative. Fact Sheet for Patients: SugarRoll.be Fact Sheet for Healthcare Providers: https://www.woods-mathews.com/ This test is not yet approved or cleared by the [place] FDA and  has been authorized for detection and/or diagnosis of SARS-CoV-2 by FDA under an Emergency Use Authorization (EUA). This EUA will remain  in effect (meaning this test can be used) for the duration of the COVID-19 declaration under Section 56 4(b)(1) of the Act, 21 U.S.C. section 360bbb-3(b)(1), unless the authorization is terminated or revoked sooner. Performed at [facility], [place] [address] [place], [place] [address]   Culture, Urine     Status: None   Collection Time: 07/05/19  1:52 PM   Specimen: Urine, Clean Catch  Result Value Ref Range Status   Specimen Description   Final    URINE, CLEAN CATCH Performed at [facility], [place] [address], [place], [place] [address]    Special Requests   Final    Normal Performed at [facility], [place] [address], [place], [place] [address]    Culture   Final    NO GROWTH Performed at [facility], [place] [address],  [place], [place] [address]    Report Status 07/06/2019 FINAL  Final     Time coordinating discharge: 35 minutes  SIGNED:   Cordelia Poche, MD [facility] 07/07/2019, 11:49 AM

## 2019-07-07 NOTE — TOC Transition Note (Signed)
Transition of Care [facility]) - CM/SW Discharge Note   Patient Details  Name: Cody Mendoza MRN: [id number] Date of Birth: 03-25-1921  Transition of Care [facility]) CM/SW Contact:  Greg Cutter, LCSW Phone Number: 07/07/2019, 4:56 PM   Clinical Narrative:   LCSW contacted patient's RN and received permission for discharge. LCSW completed call to [facility] and successfully set up transportation arrangements for patient to go to [facility]. Juliann Pulse with admissions was updated.   Final next level of care: Skilled Nursing Facility Barriers to Discharge: Continued Medical Work up   Patient Goals and CMS Choice Patient states their goals for this hospitalization and ongoing recovery are:: go to rehab   Discharge Placement PASRR number recieved: 07/05/19            Patient [facility]   Discharge Plan and Services   Discharge Planning Services: CM Consult             Readmission Risk Interventions No flowsheet data found.

## 2019-07-07 NOTE — TOC Initial Note (Signed)
Transition of Care [facility]) - Initial/Assessment Note    Patient Details  Name: Cody Mendoza MRN: [id number] Date of Birth: 1921/04/09  Transition of Care [facility]) CM/SW Contact:    Greg Cutter, LCSW Phone Number: 07/07/2019, 1:42 PM  Clinical Narrative:       LCSW spoke with Juliann Pulse with [facility] on 07/07/19. She reports having insurance approval now but is still in need of a new COVID test as last one was on 07/03/19. LCSW completed return call back to [facility] to inform her that COVID test has been completed and inquiring if patient can come to facility today once results arrive. Voice message left.  LCSW will complete medical necessity form and needed paperwork so that transportation can be easily arranged once discharge is ready.    Expected Discharge Plan: Skilled Nursing Facility Barriers to Discharge: Continued Medical Work up   Patient Goals and CMS Choice Patient states their goals for this hospitalization and ongoing recovery are:: go to rehab      Expected Discharge Plan and Services Expected Discharge Plan: [place]   Discharge Planning Services: CM Consult   Living arrangements for the past 2 months: [place] Expected Discharge Date: 07/07/19                   Prior Living Arrangements/Services Living arrangements for the past 2 months: [place] Lives with:: Facility Resident Patient language and need for interpreter reviewed:: Yes Do you feel safe going back to the place where you live?: Yes      Need for Family Participation in Patient Care: No (Comment) Care giver support system in place?: Yes (comment) Current home services: DME(w/c) Criminal Activity/Legal Involvement Pertinent to Current Situation/Hospitalization: No - Comment as needed  Activities of Daily Living Home Assistive Devices/Equipment: Cane (specify quad or straight), Eyeglasses, Built-in shower seat, Other (Comment), Grab bars around  toilet, Grab bars in shower, Hand-held shower hose(walk in shower, single point cane) ADL Screening (condition at time of admission) Patient's cognitive ability adequate to safely complete daily activities?: Yes Is the patient deaf or have difficulty hearing?: Yes Does the patient have difficulty seeing, even when wearing glasses/contacts?: No Does the patient have difficulty concentrating, remembering, or making decisions?: No Patient able to express need for assistance with ADLs?: Yes Does the patient have difficulty dressing or bathing?: No Independently performs ADLs?: Yes (appropriate for developmental age) Does the patient have difficulty walking or climbing stairs?: Yes Weakness of Legs: Both Weakness of Arms/Hands: None  Permission Sought/Granted Permission sought to share information with : Case Manager Permission granted to share information with : Yes, Verbal Permission Granted  Share Information with NAME: Case Manager  Permission granted to share info w AGENCY: [facility]        Emotional Assessment Appearance:: Appears stated age Attitude/Demeanor/Rapport: Gracious Affect (typically observed): Accepting Orientation: : Oriented to Self, Oriented to  Time, Oriented to Place, Oriented to Situation Alcohol / Substance Use: Illicit Drugs Psych Involvement: No (comment)  Admission diagnosis:  SOB (shortness of breath) [R06.02] Acute CHF (congestive heart failure) (HCC) 0000000 Systolic congestive heart failure, unspecified HF chronicity ([place]) [I50.20] Patient Active Problem List   Diagnosis Date Noted  . Acute CHF (congestive heart failure) ([place]) 06/26/2019  . Lower extremity edema 09/13/2018  . Near syncope 08/28/2018  . Hyponatremia 08/28/2018  . TIA (transient ischemic attack) 08/27/2018  . Discitis of thoracic region 08/23/2018  . CKD (chronic kidney disease) stage 2, GFR  60-89 ml/min 08/23/2018  . Chronic anemia 08/23/2018  . Discitis 08/23/2018  .  Puncture wound of right lower leg without foreign body 10/08/2013  . Mitral insufficiency 12/28/2010  . Atrial fibrillation ([place]) 11/16/2010  . Hypertension   . GERD 04/22/2010  . PERSONAL HX COLONIC POLYPS 04/22/2010   PCP:  Crist Infante, MD Pharmacy:   [facility] [address] [place] [facility] [place] [address] [place] [address] Phone: [phone] Fax: [phone]  [facility] [place], [place] [address] [place] [address] Phone: [phone] Fax: [phone]   Readmission Risk Interventions No flowsheet data found.

## 2019-08-10 ENCOUNTER — Other Ambulatory Visit: Payer: Self-pay

## 2019-08-10 ENCOUNTER — Inpatient Hospital Stay (HOSPITAL_COMMUNITY)
Admission: EM | Admit: 2019-08-10 | Discharge: 2019-08-17 | DRG: 291 | Disposition: A | Discharge status: Home / Self Care | Payer: Medicare Other | Attending: Internal Medicine | Admitting: Internal Medicine

## 2019-08-10 ENCOUNTER — Emergency Department (HOSPITAL_COMMUNITY): Payer: Medicare Other

## 2019-08-10 ENCOUNTER — Encounter (HOSPITAL_COMMUNITY): Payer: Self-pay | Admitting: Emergency Medicine

## 2019-08-10 DIAGNOSIS — Z7901 Long term (current) use of anticoagulants: Secondary | ICD-10-CM | POA: Diagnosis not present

## 2019-08-10 DIAGNOSIS — D649 Anemia, unspecified: Secondary | ICD-10-CM | POA: Diagnosis not present

## 2019-08-10 DIAGNOSIS — J810 Acute pulmonary edema: Secondary | ICD-10-CM | POA: Diagnosis present

## 2019-08-10 DIAGNOSIS — J45909 Unspecified asthma, uncomplicated: Secondary | ICD-10-CM | POA: Diagnosis present

## 2019-08-10 DIAGNOSIS — I10 Essential (primary) hypertension: Secondary | ICD-10-CM | POA: Diagnosis not present

## 2019-08-10 DIAGNOSIS — J690 Pneumonitis due to inhalation of food and vomit: Secondary | ICD-10-CM | POA: Diagnosis not present

## 2019-08-10 DIAGNOSIS — Z8249 Family history of ischemic heart disease and other diseases of the circulatory system: Secondary | ICD-10-CM

## 2019-08-10 DIAGNOSIS — R509 Fever, unspecified: Secondary | ICD-10-CM | POA: Diagnosis not present

## 2019-08-10 DIAGNOSIS — Z7189 Other specified counseling: Secondary | ICD-10-CM | POA: Diagnosis not present

## 2019-08-10 DIAGNOSIS — E871 Hypo-osmolality and hyponatremia: Secondary | ICD-10-CM | POA: Diagnosis not present

## 2019-08-10 DIAGNOSIS — D631 Anemia in chronic kidney disease: Secondary | ICD-10-CM | POA: Diagnosis present

## 2019-08-10 DIAGNOSIS — I5031 Acute diastolic (congestive) heart failure: Secondary | ICD-10-CM | POA: Diagnosis not present

## 2019-08-10 DIAGNOSIS — R269 Unspecified abnormalities of gait and mobility: Secondary | ICD-10-CM | POA: Diagnosis present

## 2019-08-10 DIAGNOSIS — Z515 Encounter for palliative care: Secondary | ICD-10-CM | POA: Diagnosis not present

## 2019-08-10 DIAGNOSIS — K219 Gastro-esophageal reflux disease without esophagitis: Secondary | ICD-10-CM | POA: Diagnosis not present

## 2019-08-10 DIAGNOSIS — N182 Chronic kidney disease, stage 2 (mild): Secondary | ICD-10-CM | POA: Diagnosis present

## 2019-08-10 DIAGNOSIS — I482 Chronic atrial fibrillation, unspecified: Secondary | ICD-10-CM | POA: Diagnosis present

## 2019-08-10 DIAGNOSIS — I4891 Unspecified atrial fibrillation: Secondary | ICD-10-CM | POA: Diagnosis not present

## 2019-08-10 DIAGNOSIS — T502X5A Adverse effect of carbonic-anhydrase inhibitors, benzothiadiazides and other diuretics, initial encounter: Secondary | ICD-10-CM | POA: Diagnosis present

## 2019-08-10 DIAGNOSIS — Z8673 Personal history of transient ischemic attack (TIA), and cerebral infarction without residual deficits: Secondary | ICD-10-CM

## 2019-08-10 DIAGNOSIS — I5033 Acute on chronic diastolic (congestive) heart failure: Secondary | ICD-10-CM | POA: Diagnosis not present

## 2019-08-10 DIAGNOSIS — Z79899 Other long term (current) drug therapy: Secondary | ICD-10-CM

## 2019-08-10 DIAGNOSIS — K5790 Diverticulosis of intestine, part unspecified, without perforation or abscess without bleeding: Secondary | ICD-10-CM | POA: Diagnosis present

## 2019-08-10 DIAGNOSIS — H9190 Unspecified hearing loss, unspecified ear: Secondary | ICD-10-CM | POA: Diagnosis present

## 2019-08-10 DIAGNOSIS — Z993 Dependence on wheelchair: Secondary | ICD-10-CM

## 2019-08-10 DIAGNOSIS — Z66 Do not resuscitate: Secondary | ICD-10-CM | POA: Diagnosis present

## 2019-08-10 DIAGNOSIS — G9341 Metabolic encephalopathy: Secondary | ICD-10-CM | POA: Diagnosis not present

## 2019-08-10 DIAGNOSIS — I472 Ventricular tachycardia: Secondary | ICD-10-CM | POA: Diagnosis not present

## 2019-08-10 DIAGNOSIS — R531 Weakness: Secondary | ICD-10-CM | POA: Diagnosis not present

## 2019-08-10 DIAGNOSIS — Z20822 Contact with and (suspected) exposure to covid-19: Secondary | ICD-10-CM | POA: Diagnosis present

## 2019-08-10 DIAGNOSIS — I130 Hypertensive heart and chronic kidney disease with heart failure and stage 1 through stage 4 chronic kidney disease, or unspecified chronic kidney disease: Principal | ICD-10-CM | POA: Diagnosis present

## 2019-08-10 DIAGNOSIS — I509 Heart failure, unspecified: Secondary | ICD-10-CM | POA: Diagnosis not present

## 2019-08-10 DIAGNOSIS — Z87442 Personal history of urinary calculi: Secondary | ICD-10-CM

## 2019-08-10 DIAGNOSIS — E876 Hypokalemia: Secondary | ICD-10-CM | POA: Diagnosis not present

## 2019-08-10 LAB — CBC WITH DIFFERENTIAL/PLATELET
Abs Immature Granulocytes: 0.07 10*3/uL (ref 0.00–0.07)
Basophils Absolute: 0.1 10*3/uL (ref 0.0–0.1)
Basophils Relative: 1 %
Eosinophils Absolute: 0.4 10*3/uL (ref 0.0–0.5)
Eosinophils Relative: 3 %
HCT: 25.2 % — ABNORMAL LOW (ref 39.0–52.0)
Hemoglobin: 7.8 g/dL — ABNORMAL LOW (ref 13.0–17.0)
Immature Granulocytes: 1 %
Lymphocytes Relative: 10 %
Lymphs Abs: 1.1 10*3/uL (ref 0.7–4.0)
MCH: 29.3 pg (ref 26.0–34.0)
MCHC: 31 g/dL (ref 30.0–36.0)
MCV: 94.7 fL (ref 80.0–100.0)
Monocytes Absolute: 1.6 10*3/uL — ABNORMAL HIGH (ref 0.1–1.0)
Monocytes Relative: 15 %
Neutro Abs: 7.5 10*3/uL (ref 1.7–7.7)
Neutrophils Relative %: 70 %
Platelets: 263 10*3/uL (ref 150–400)
RBC: 2.66 MIL/uL — ABNORMAL LOW (ref 4.22–5.81)
RDW: 17.7 % — ABNORMAL HIGH (ref 11.5–15.5)
WBC: 10.6 10*3/uL — ABNORMAL HIGH (ref 4.0–10.5)
nRBC: 0 % (ref 0.0–0.2)

## 2019-08-10 LAB — BASIC METABOLIC PANEL
Anion gap: 7 (ref 5–15)
BUN: 24 mg/dL — ABNORMAL HIGH (ref 8–23)
CO2: 25 mmol/L (ref 22–32)
Calcium: 8.1 mg/dL — ABNORMAL LOW (ref 8.9–10.3)
Chloride: 104 mmol/L (ref 98–111)
Creatinine, Ser: 1.1 mg/dL (ref 0.61–1.24)
GFR calc Af Amer: >60 mL/min (ref >60)
GFR calc non Af Amer: 57 mL/min — ABNORMAL LOW (ref >60)
Glucose, Bld: 107 mg/dL — ABNORMAL HIGH (ref 70–99)
Potassium: 3.8 mmol/L (ref 3.5–5.1)
Sodium: 136 mmol/L (ref 135–145)

## 2019-08-10 LAB — ABO/RH: ABO/RH(D): A POS

## 2019-08-10 LAB — PREPARE RBC (CROSSMATCH)

## 2019-08-10 LAB — SARS CORONAVIRUS 2 BY RT PCR (HOSPITAL ORDER, PERFORMED IN ~~LOC~~ HOSPITAL LAB): SARS Coronavirus 2: NEGATIVE

## 2019-08-10 LAB — PROTIME-INR
INR: 1.7 — ABNORMAL HIGH (ref 0.8–1.2)
Prothrombin Time: 19.4 seconds — ABNORMAL HIGH (ref 11.4–15.2)

## 2019-08-10 LAB — TROPONIN I (HIGH SENSITIVITY): Troponin I (High Sensitivity): 8 ng/L (ref <18)

## 2019-08-10 LAB — MRSA PCR SCREENING: MRSA by PCR: POSITIVE — AB

## 2019-08-10 LAB — POC OCCULT BLOOD, ED: Fecal Occult Bld: NEGATIVE

## 2019-08-10 LAB — BRAIN NATRIURETIC PEPTIDE: B Natriuretic Peptide: 196.7 pg/mL — ABNORMAL HIGH (ref 0.0–100.0)

## 2019-08-10 MED ORDER — LEVALBUTEROL HCL 0.63 MG/3ML IN NEBU
0.6300 mg | INHALATION_SOLUTION | Freq: Four times a day (QID) | RESPIRATORY_TRACT | Status: DC | PRN
  Administered 2019-08-11 – 2019-08-14 (×5): 0.63 mg via RESPIRATORY_TRACT
  Filled 2019-08-10 (×6): qty 3

## 2019-08-10 MED ORDER — ONDANSETRON HCL 4 MG PO TABS: 4.0000 mg | ORAL_TABLET | Freq: Four times a day (QID) | ORAL | Status: DC | PRN

## 2019-08-10 MED ORDER — ACETAMINOPHEN 325 MG PO TABS
650.0000 mg | ORAL_TABLET | Freq: Four times a day (QID) | ORAL | Status: DC | PRN
  Administered 2019-08-11 (×2): 650 mg via ORAL
  Filled 2019-08-10 (×3): qty 2

## 2019-08-10 MED ORDER — FUROSEMIDE 10 MG/ML IJ SOLN
20.0000 mg | Freq: Three times a day (TID) | INTRAMUSCULAR | Status: DC
  Administered 2019-08-10 – 2019-08-11 (×4): 20 mg via INTRAVENOUS
  Filled 2019-08-10: qty 2
  Filled 2019-08-10: qty 4
  Filled 2019-08-10: qty 2
  Filled 2019-08-10: qty 4

## 2019-08-10 MED ORDER — METOPROLOL SUCCINATE ER 25 MG PO TB24
25.0000 mg | ORAL_TABLET | Freq: Every day | ORAL | Status: DC
  Administered 2019-08-10 – 2019-08-17 (×8): 25 mg via ORAL
  Filled 2019-08-10 (×8): qty 1

## 2019-08-10 MED ORDER — APIXABAN 5 MG PO TABS
5.0000 mg | ORAL_TABLET | Freq: Two times a day (BID) | ORAL | Status: DC
  Administered 2019-08-10 – 2019-08-17 (×14): 5 mg via ORAL
  Filled 2019-08-10 (×15): qty 1

## 2019-08-10 MED ORDER — SODIUM CHLORIDE 0.9 % IV SOLN: 10.0000 mL/h | Freq: Once | INTRAVENOUS | Status: DC

## 2019-08-10 MED ORDER — DOXAZOSIN MESYLATE 2 MG PO TABS
2.0000 mg | ORAL_TABLET | Freq: Every day | ORAL | Status: DC
  Administered 2019-08-10 – 2019-08-17 (×8): 2 mg via ORAL
  Filled 2019-08-10 (×8): qty 1

## 2019-08-10 MED ORDER — ALBUTEROL SULFATE (2.5 MG/3ML) 0.083% IN NEBU
5.0000 mg | INHALATION_SOLUTION | Freq: Once | RESPIRATORY_TRACT | Status: AC
  Administered 2019-08-10: 5 mg via RESPIRATORY_TRACT
  Filled 2019-08-10: qty 6

## 2019-08-10 MED ORDER — AMLODIPINE BESYLATE 5 MG PO TABS
5.0000 mg | ORAL_TABLET | Freq: Every evening | ORAL | Status: DC
  Administered 2019-08-10 – 2019-08-16 (×7): 5 mg via ORAL
  Filled 2019-08-10 (×7): qty 1

## 2019-08-10 MED ORDER — POLYVINYL ALCOHOL 1.4 % OP SOLN
1.0000 [drp] | Freq: Every day | OPHTHALMIC | Status: DC
  Administered 2019-08-10 – 2019-08-16 (×7): 1 [drp] via OPHTHALMIC
  Filled 2019-08-10: qty 15

## 2019-08-10 MED ORDER — FUROSEMIDE 10 MG/ML IJ SOLN
40.0000 mg | Freq: Once | INTRAMUSCULAR | Status: AC
  Administered 2019-08-10: 40 mg via INTRAVENOUS
  Filled 2019-08-10: qty 4

## 2019-08-10 MED ORDER — ADULT MULTIVITAMIN W/MINERALS CH
1.0000 | ORAL_TABLET | Freq: Every day | ORAL | Status: DC
  Administered 2019-08-10 – 2019-08-16 (×7): 1 via ORAL
  Filled 2019-08-10 (×7): qty 1

## 2019-08-10 MED ORDER — ACETAMINOPHEN 650 MG RE SUPP
650.0000 mg | Freq: Four times a day (QID) | RECTAL | Status: DC | PRN
  Administered 2019-08-15: 650 mg via RECTAL
  Filled 2019-08-10: qty 1

## 2019-08-10 MED ORDER — MIRTAZAPINE 15 MG PO TABS
15.0000 mg | ORAL_TABLET | Freq: Every day | ORAL | Status: DC
  Administered 2019-08-11 – 2019-08-16 (×6): 15 mg via ORAL
  Filled 2019-08-10 (×2): qty 2
  Filled 2019-08-10 (×5): qty 1

## 2019-08-10 MED ORDER — SENNOSIDES-DOCUSATE SODIUM 8.6-50 MG PO TABS
1.0000 | ORAL_TABLET | Freq: Every evening | ORAL | Status: DC | PRN
  Administered 2019-08-16: 1 via ORAL
  Filled 2019-08-10: qty 1

## 2019-08-10 MED ORDER — ONDANSETRON HCL 4 MG/2ML IJ SOLN: 4.0000 mg | Freq: Four times a day (QID) | INTRAMUSCULAR | Status: DC | PRN

## 2019-08-10 MED ORDER — PANTOPRAZOLE SODIUM 40 MG PO TBEC
40.0000 mg | DELAYED_RELEASE_TABLET | Freq: Every morning | ORAL | Status: DC
  Administered 2019-08-10 – 2019-08-17 (×8): 40 mg via ORAL
  Filled 2019-08-10 (×8): qty 1

## 2019-08-10 MED ORDER — LATANOPROST 0.005 % OP SOLN
1.0000 [drp] | Freq: Every day | OPHTHALMIC | Status: DC
  Administered 2019-08-10 – 2019-08-16 (×7): 1 [drp] via OPHTHALMIC
  Filled 2019-08-10: qty 2.5

## 2019-08-10 MED ORDER — CYANOCOBALAMIN 500 MCG PO TABS
500.0000 ug | ORAL_TABLET | Freq: Every morning | ORAL | Status: DC
  Administered 2019-08-11 – 2019-08-17 (×6): 500 ug via ORAL
  Filled 2019-08-10 (×7): qty 1

## 2019-08-10 NOTE — ED Triage Notes (Signed)
Patient states shortness of breath x1.5 hours. Patient lives at [facility] independent [facility]. Patient 92% RA with expiratory wheezing. Patient 99% on 3L [place]. Patient refused breathing treatment en route.

## 2019-08-10 NOTE — H&P (Signed)
History and Physical    Cody Mendoza DVV:[id number] DOB: March 01, 1922 DOA: 08/10/2019  PCP: Crist Infante, MD   Chief Complaint: Shortness of breath  HPI: Cody Mendoza is a 86 y.o. male with medical history significant of atrial fibrillation, diverticulosis, hypertension, heart failure diastolic dysfunction with preserved ejection fraction EF 55 to 60% as of April 2021, asthma, GERD.  Patient presents with somewhat of an acute exacerbation of shortness of breath overnight from [place] independent living facility where he lives with his wife.  Patient indicates shortness of breath is worse when supine, improved with sitting upright as well as worse with exertion.  He states he was feeling quite well last week but over the past few days has began to feel somewhat more tired and fatigued more easily than usual and notes some mild swelling in his lower extremities that appears to be dependent, improving overnight with rest.  More recently the patient indicated severe shortness of breath while laying supine in bed overnight prompting visit to the ED.  ED Course: In ED patient had chest x-ray consistent with bibasilar opacifications concerning for effusion although pneumonia cannot be ruled out, patient denies fevers chills cough or sputum production.  Patient noted also to be moderately anemic with hemoglobin at 7.8 with a baseline around 9 per previous chart review patient has blood ordered in the ED, was given Lasix 40x1 for presumed heart failure exacerbation with symptomatic anemia.  Hospitalist called to admit  Review of Systems: As per HPI patient denies headache, fever, chills, nausea, vomiting, diarrhea, constipation.  Patient does admit to orthopnea, dyspnea with exertion as well as bilateral lower extremity edema..   Assessment/Plan Active Problems:   Acute CHF (congestive heart failure) (HCC)   Acute exacerbation of congestive heart failure (HCC)   Acute diastolic heart failure  exacerbation -Unclear if secondary to poor dietary compliance or medication noncompliance -no diuretics listed on home med rec, will need to be verified with pharmacy -Continue diuresis with Lasix, limit IV fluids as possible; dietary fluid restriction at 1200 cc -Most recent echo April 2021, will not repeat -at that time EF 55 to 60%  Anemia, acute questionable blood loss on chronic anemia of chronic disease  -Unclear if symptomatic anemia given symptoms crossover with #1 as above -1 unit PRBC ordered by ED physician, continue aggressive diuresis to ensure no worsening volume overload  Atrial fibrillation, chronic, rate controlled -Continue home rate control medications with metoprolol -Continue Eliquis 5 twice daily for anticoagulation -Rate currently well controlled  Hypertension -Continue home amlodipine, metoprolol  Asthma -No acute exacerbation, continue home inhaled medications including levalbuterol, nebs as needed  GERD -Well-controlled, continue pantoprazole  Diverticulosis -Without acute flare  Chronic ambulatory dysfunction -Indicates he has been wheelchair and lift bound for approximately 1 year now with worsening weakness more acutely transfers more difficult -PT OT to evaluate, follow for further recommendations  DVT prophylaxis: Eliquis Code Status: DNR Family Communication: None present Status is: Inpatient  Dispo: The patient is from: [facility]              Anticipated d/c is to: Same              Anticipated d/c date is: 48 to 72 hours pending clinical course              Patient currently not medically stable for discharge due to ongoing need for IV diuretics, blood transfusion, close monitoring in the setting of heart failure exacerbation and  possible further evaluation with cardiology or other specialty services while inpatient  Consultants:   None  Procedures:   None planned   Past Medical History:  Diagnosis Date  . AF  (atrial fibrillation) ([place])   . Asthma   . Detached retina   . Discitis of thoracic region 08/2018  . Diverticulosis   . GERD (gastroesophageal reflux disease)   . [name] syndrome   . Hypertension   . Lactose intolerance   . Mitral insufficiency   . Renal calculi   . TIA (transient ischemic attack)     Past Surgical History:  Procedure Laterality Date  . cataract surgery    . ESOPHAGEAL DILATION     In the past  . FINGER TENDON REPAIR    . INGUINAL HERNIA REPAIR    . KIDNEY STONE SURGERY     Status post syrgical removal of a   . TONSILLECTOMY       reports that he has never smoked. He has never used smokeless tobacco. He reports current alcohol use. He reports that he does not use drugs.  No Known Allergies  Family History  Problem Relation Age of Onset  . Hypertension Mother     Prior to Admission medications   Medication Sig Start Date End Date Taking? Authorizing Provider  amLODipine (NORVASC) 5 MG tablet Take 1 tablet (5 mg total) by mouth daily. Patient taking differently: Take 5 mg by mouth every evening.  02/21/19 06/26/19  Curatolo, Adam, DO  apixaban (ELIQUIS) 5 MG TABS tablet Take 1 tablet (5 mg total) by mouth 2 (two) times daily. 09/17/18   Martinique, Peter M, MD  carvedilol (COREG) 3.125 MG tablet Take 3.125 mg by mouth 2 (two) times daily. 07/31/19   [provider]  Cyanocobalamin (VITAMIN B12) 500 MCG TABS Take 500 mcg by mouth every morning.     [provider]  doxazosin (CARDURA) 4 MG tablet Take 0.5 tablets (2 mg total) by mouth daily. 08/28/18   Hongalgi, Lenis Dickinson, MD  doxycycline (VIBRA-TABS) 100 MG tablet Take 100 mg by mouth 2 (two) times daily. 07/31/19   [provider]  Lactobacillus (PROBIOTIC ACIDOPHILUS PO) Take 1 capsule by mouth daily with supper.     [provider]  latanoprost (XALATAN) 0.005 % ophthalmic solution Place 1 drop into both eyes at bedtime.  09/05/17   [provider]  levalbuterol  Penne Lash) 0.63 MG/3ML nebulizer solution Take 3 mLs (0.63 mg total) by nebulization every 6 (six) hours as needed for wheezing or shortness of breath. 07/07/19   Mariel Aloe, MD  losartan (COZAAR) 100 MG tablet Take 100 mg by mouth daily. 07/31/19   [provider]  metoprolol succinate (TOPROL-XL) 50 MG 24 hr tablet Take 50 mg by mouth daily. 08/06/19   [provider]  mirtazapine (REMERON) 15 MG tablet Take 15 mg by mouth at bedtime. 08/09/19   [provider]  Multiple Vitamin (MULTIVITAMIN WITH MINERALS) TABS Take 1 tablet by mouth daily with supper.     [provider]  Multiple Vitamins-Minerals (ICAPS AREDS FORMULA PO) Take 1 capsule by mouth 2 (two) times daily with breakfast and lunch.     [provider]  pantoprazole (PROTONIX) 40 MG tablet Take 40 mg by mouth every morning.     [provider]  Polyethyl Glycol-Propyl Glycol (SYSTANE) 0.4-0.3 % SOLN Place 1 drop into both eyes at bedtime.     [provider]  QUEtiapine (SEROQUEL) 25 MG tablet Take  1 tablet (25 mg total) by mouth at bedtime. 07/07/19   Mariel Aloe, MD    Physical Exam: Vitals:   08/10/19 0425 08/10/19 0445 08/10/19 0510 08/10/19 0721  BP:  (!) 147/63 123/71 (!) 128/56  Pulse:  78 71 80  Resp:  20 18 (!) 26  Temp: 99.1 F (37.3 C)     TempSrc: Rectal     SpO2:  99% 99% 96%    Constitutional: NAD, calm, comfortable Vitals:   08/10/19 0425 08/10/19 0445 08/10/19 0510 08/10/19 0721  BP:  (!) 147/63 123/71 (!) 128/56  Pulse:  78 71 80  Resp:  20 18 (!) 26  Temp: 99.1 F (37.3 C)     TempSrc: Rectal     SpO2:  99% 99% 96%   General:  Pleasantly resting in bed, No acute distress. HEENT: Left pupil irregular, poorly reactive, right pupil round reactive Neck:  Without mass or deformity.  Trachea is midline. Lungs: Bibasilar rales without overt rhonchi or wheeze Heart:  Regular rate and rhythm.  Without murmurs, rubs, or gallops. Abdomen:  Soft,  nontender, nondistended.  Without guarding or rebound. Extremities: Without cyanosis, clubbing, 1-2+ pitting edema Vascular:  Dorsalis pedis and posterior tibial pulses palpable bilaterally. Skin:  Warm and dry, no erythema, no ulcerations.  Labs on Admission: I have personally reviewed following labs and imaging studies  CBC: Recent Labs  Lab 08/10/19 0444  WBC 10.6*  NEUTROABS 7.5  HGB 7.8*  HCT 25.2*  MCV 94.7  PLT 403   Basic Metabolic Panel: Recent Labs  Lab 08/10/19 0444  NA 136  K 3.8  CL 104  CO2 25  GLUCOSE 107*  BUN 24*  CREATININE 1.10  CALCIUM 8.1*   GFR: CrCl cannot be calculated (Unknown ideal weight.). Liver Function Tests: No results for input(s): AST, ALT, ALKPHOS, BILITOT, PROT, ALBUMIN in the last 168 hours. No results for input(s): LIPASE, AMYLASE in the last 168 hours. No results for input(s): AMMONIA in the last 168 hours. Coagulation Profile: Recent Labs  Lab 08/10/19 0525  INR 1.7*   Cardiac Enzymes: No results for input(s): CKTOTAL, CKMB, CKMBINDEX, TROPONINI in the last 168 hours. BNP (last 3 results) No results for input(s): PROBNP in the last 8760 hours. HbA1C: No results for input(s): HGBA1C in the last 72 hours. CBG: No results for input(s): GLUCAP in the last 168 hours. Lipid Profile: No results for input(s): CHOL, HDL, LDLCALC, TRIG, CHOLHDL, LDLDIRECT in the last 72 hours. Thyroid Function Tests: No results for input(s): TSH, T4TOTAL, FREET4, T3FREE, THYROIDAB in the last 72 hours. Anemia Panel: No results for input(s): VITAMINB12, FOLATE, FERRITIN, TIBC, IRON, RETICCTPCT in the last 72 hours. Urine analysis:    Component Value Date/Time   COLORURINE STRAW (A) 07/05/2019 1351   APPEARANCEUR CLEAR 07/05/2019 1351   LABSPEC 1.008 07/05/2019 1351   PHURINE 5.0 07/05/2019 1351   GLUCOSEU NEGATIVE 07/05/2019 1351   HGBUR SMALL (A) 07/05/2019 1351   BILIRUBINUR NEGATIVE 07/05/2019 1351   KETONESUR NEGATIVE 07/05/2019 1351     PROTEINUR NEGATIVE 07/05/2019 1351   UROBILINOGEN 1.0 09/21/2014 1610   NITRITE NEGATIVE 07/05/2019 1351   LEUKOCYTESUR NEGATIVE 07/05/2019 1351    Radiological Exams on Admission: DG Chest Portable 1 View  Result Date: 08/10/2019 CLINICAL DATA:  Patient states shortness of breath x1.5 hours. Patient lives at [facility] independent [facility]. Patient 92% RA with expiratory wheezing EXAM: PORTABLE CHEST 1 VIEW COMPARISON:  07/05/2019 FINDINGS: Cardiac silhouette is mildly enlarged. No mediastinal or  hilar masses. There is vascular congestion, mild interstitial thickening and hazy opacity at both lung bases, the latter finding obscuring hemidiaphragms consistent with pleural effusions and atelectasis. No pneumothorax. Skeletal structures are grossly intact. IMPRESSION: 1. Findings support congestive heart failure associated with small effusions. Lung base pneumonia is not excluded and should be considered in the proper clinical setting. Electronically Signed   By: Lajean Manes M.D.   On: 08/10/2019 05:49    EKG: Independently reviewed.  Low voltage, A. fib borderline slow ventricular response without overt ST elevations or depressions   Little Ishikawa DO [facility]  If 7PM-7AM, please contact night-coverage [email or web address]   08/10/2019, 8:00 AM

## 2019-08-10 NOTE — ED Provider Notes (Signed)
TIME SEEN: 4:29 AM  CHIEF COMPLAINT: Shortness of breath  HPI: Is a 86 year old male with history of A. fib on Eliquis, hypertension, TIA who presents to the emergency department shortness of breath that woke him from sleep at 1 AM.  Patient lives at [facility] independent living with his wife.  He reports he has had a cough but no fever.  Reports he has had both COVID-19 vaccines, last was at the end of April.  He was admitted in April for CHF exacerbation but at that time echocardiogram showed preserved ejection fraction.  No chest pain or chest discomfort.  No calf swelling or tenderness.  No history of PE or DVT.  No hypoxia with EMS but was placed on oxygen for comfort.  Echo 06/27/19:  IMPRESSIONS    1. Left ventricular ejection fraction, by estimation, is 55 to 60%. The  left ventricle has normal function. The left ventricle has no regional  wall motion abnormalities. Left ventricular diastolic parameters are  indeterminate.  2. Right ventricular systolic function is mildly reduced. The right  ventricular size is normal. There is mildly elevated pulmonary artery  systolic pressure. The estimated right ventricular systolic pressure is  30.0 mmHg.  3. Left atrial size was moderately dilated.  4. Right atrial size was mildly dilated.  5. The mitral valve is normal in structure. Trivial mitral valve  regurgitation. No evidence of mitral stenosis.  6. The aortic valve is tricuspid. Aortic valve regurgitation is not  visualized. Mild aortic valve sclerosis is present, with no evidence of  aortic valve stenosis.  7. The inferior vena cava is dilated in size with >50% respiratory  variability, suggesting right atrial pressure of 8 mmHg.  8. The patient is in atrial fibrillation.   ROS: See HPI Constitutional: no fever  Eyes: no drainage  ENT: no runny nose   Cardiovascular:  no chest pain  Resp: SOB  GI: no vomiting GU: no dysuria Integumentary: no rash  Allergy: no  hives  Musculoskeletal: no leg swelling  Neurological: no slurred speech ROS otherwise negative  PAST MEDICAL HISTORY/PAST SURGICAL HISTORY:  Past Medical History:  Diagnosis Date  . AF (atrial fibrillation) ([place])   . Asthma   . Detached retina   . Discitis of thoracic region 08/2018  . Diverticulosis   . GERD (gastroesophageal reflux disease)   . [name] syndrome   . Hypertension   . Lactose intolerance   . Mitral insufficiency   . Renal calculi   . TIA (transient ischemic attack)     MEDICATIONS:  Prior to Admission medications   Medication Sig Start Date End Date Taking? Authorizing Provider  amLODipine (NORVASC) 5 MG tablet Take 1 tablet (5 mg total) by mouth daily. Patient taking differently: Take 5 mg by mouth every evening.  02/21/19 06/26/19  Curatolo, Adam, DO  apixaban (ELIQUIS) 5 MG TABS tablet Take 1 tablet (5 mg total) by mouth 2 (two) times daily. 09/17/18   Martinique, Peter M, MD  Cyanocobalamin (VITAMIN B12) 500 MCG TABS Take 500 mcg by mouth every morning.     [provider]  doxazosin (CARDURA) 4 MG tablet Take 0.5 tablets (2 mg total) by mouth daily. 08/28/18   Hongalgi, Lenis Dickinson, MD  Lactobacillus (PROBIOTIC ACIDOPHILUS PO) Take 1 capsule by mouth daily with supper.     [provider]  latanoprost (XALATAN) 0.005 % ophthalmic solution Place 1 drop into both eyes at bedtime.  09/05/17   [provider]  levalbuterol Penne Lash) 0.63  MG/3ML nebulizer solution Take 3 mLs (0.63 mg total) by nebulization every 6 (six) hours as needed for wheezing or shortness of breath. 07/07/19   Mariel Aloe, MD  metoprolol succinate (TOPROL-XL) 25 MG 24 hr tablet Take 1 tablet (25 mg total) by mouth daily. 07/08/19   Mariel Aloe, MD  Multiple Vitamin (MULTIVITAMIN WITH MINERALS) TABS Take 1 tablet by mouth daily with supper.     [provider]  Multiple Vitamins-Minerals (ICAPS AREDS FORMULA PO) Take 1 capsule by mouth 2 (two) times daily with  breakfast and lunch.     [provider]  pantoprazole (PROTONIX) 40 MG tablet Take 40 mg by mouth every morning.     [provider]  Polyethyl Glycol-Propyl Glycol (SYSTANE) 0.4-0.3 % SOLN Place 1 drop into both eyes at bedtime.     [provider]  QUEtiapine (SEROQUEL) 25 MG tablet Take 1 tablet (25 mg total) by mouth at bedtime. 07/07/19   Mariel Aloe, MD    ALLERGIES:  No Known Allergies  SOCIAL HISTORY:  Social History   Tobacco Use  . Smoking status: Never Smoker  . Smokeless tobacco: Never Used  Substance Use Topics  . Alcohol use: Yes    Alcohol/week: 0.0 standard drinks    Comment: rarely    FAMILY HISTORY: Family History  Problem Relation Age of Onset  . Hypertension Mother     EXAM: BP (!) 158/68   Pulse 76   Temp 99.1 F (37.3 C) (Rectal)   Resp 18   SpO2 100%  CONSTITUTIONAL: Alert and oriented and responds appropriately to questions.  Elderly, chronically ill-appearing, extremely hard of hearing HEAD: Normocephalic EYES: Conjunctivae clear, pupils appear equal, EOM appear intact ENT: normal nose; moist mucous membranes NECK: Supple, normal ROM CARD: Irregularly irregular and rate controlled; S1 and S2 appreciated; no murmurs, no clicks, no rubs, no gallops RESP: Patient is mildly tachypneic when speaking.  No hypoxia on room air at rest.  Slightly rhonchorous breath sounds bilaterally with diminished aeration at his bases.  No wheezing. ABD/GI: Normal bowel sounds; non-distended; soft, non-tender, no rebound, no guarding, no peritoneal signs, no hepatosplenomegaly BACK:  The back appears normal EXT: Normal ROM in all joints; no deformity noted, +1 edema in bilateral distal lower extremities that is symmetric, no calf tenderness SKIN: Normal color for age and race; warm; no rash on exposed skin NEURO: Moves all extremities equally PSYCH: The patient's mood and manner are appropriate.   MEDICAL DECISION MAKING: Patient here  with shortness of breath.  Recently admitted for CHF exacerbation.  Rectal temp here 99.1.  He denies infectious symptoms and has had both of his COVID-19 vaccinations.  Denies chest pain or chest discomfort.  EKG reviewed/interpreted and shows no new ischemic change.  He is chronically in A. fib but is currently rate controlled.  Differential includes CHF, pneumonia, pneumothorax, PE, ACS.  Will obtain labs, chest x-ray.  ED PROGRESS: On my reevaluation, patient appears to be wheezing.  He denies history of asthma or COPD.  Will give nebulizer treatment and reassess.  Labs reviewed/interpreted and show hemoglobin of 7.8.  He denies bloody stool or melena and is guaiac negative here.  Troponin is negative.  BNP elevated.  Chest x-ray reviewed/interpreted and shows pulmonary edema with small effusions.  Will give Lasix and admit as he appears dyspneic at rest but no hypoxia currently.  Will also give 1 unit pRBC as there may be component of symptomatic anemia as well.  6:36 AM Discussed patient's case with hospitalist, Dr. Hal Hope.  I have recommended admission and patient (and family if present) agree with this plan. Admitting physician will place admission orders.   I reviewed all nursing notes, vitals, pertinent previous records and reviewed/interpreted all EKGs, lab and urine results, imaging (as available).    EKG Interpretation  Date/Time:  Saturday August 10 2019 04:44:15 EDT Ventricular Rate:  72 PR Interval:    QRS Duration: 91 QT Interval:  410 QTC Calculation: 449 R Axis:   -2 Text Interpretation: Atrial fibrillation Anterior infarct, old No significant change since last tracing Confirmed by Isayah Ignasiak, Cyril Mourning [phone]) on 08/10/2019 5:01:38 AM        CRITICAL CARE Performed by: Cyril Mourning Roxanne Orner   Total critical care time: 45 minutes  Critical care time was exclusive of separately billable procedures and treating other patients.  Critical care was necessary to treat or prevent  imminent or life-threatening deterioration.  Critical care was time spent personally by me on the following activities: development of treatment plan with patient and/or surrogate as well as nursing, discussions with consultants, evaluation of patient's response to treatment, examination of patient, obtaining history from patient or surrogate, ordering and performing treatments and interventions, ordering and review of laboratory studies, ordering and review of radiographic studies, pulse oximetry and re-evaluation of patient's condition.   DRAY DENTE was evaluated in Emergency Department on 08/10/2019 for the symptoms described in the history of present illness. He was evaluated in the context of the global COVID-19 pandemic, which necessitated consideration that the patient might be at risk for infection with the SARS-CoV-2 virus that causes COVID-19. Institutional protocols and algorithms that pertain to the evaluation of patients at risk for COVID-19 are in a state of rapid change based on information released by regulatory bodies including the CDC and federal and state organizations. These policies and algorithms were followed during the patient's care in the ED.      Martell Mcfadyen, Delice Bison, DO 08/10/19 [phone]

## 2019-08-11 DIAGNOSIS — N182 Chronic kidney disease, stage 2 (mild): Secondary | ICD-10-CM

## 2019-08-11 DIAGNOSIS — I10 Essential (primary) hypertension: Secondary | ICD-10-CM

## 2019-08-11 DIAGNOSIS — K219 Gastro-esophageal reflux disease without esophagitis: Secondary | ICD-10-CM

## 2019-08-11 DIAGNOSIS — E876 Hypokalemia: Secondary | ICD-10-CM

## 2019-08-11 LAB — TYPE AND SCREEN
ABO/RH(D): A POS
Antibody Screen: NEGATIVE
Unit division: 0

## 2019-08-11 LAB — BPAM RBC
Blood Product Expiration Date: 202106142359
ISSUE DATE / TIME: 202106050858
Unit Type and Rh: 6200

## 2019-08-11 LAB — BASIC METABOLIC PANEL
Anion gap: 10 (ref 5–15)
BUN: 27 mg/dL — ABNORMAL HIGH (ref 8–23)
CO2: 29 mmol/L (ref 22–32)
Calcium: 8.4 mg/dL — ABNORMAL LOW (ref 8.9–10.3)
Chloride: 96 mmol/L — ABNORMAL LOW (ref 98–111)
Creatinine, Ser: 1.2 mg/dL (ref 0.61–1.24)
GFR calc Af Amer: 59 mL/min — ABNORMAL LOW (ref >60)
GFR calc non Af Amer: 51 mL/min — ABNORMAL LOW (ref >60)
Glucose, Bld: 106 mg/dL — ABNORMAL HIGH (ref 70–99)
Potassium: 3.4 mmol/L — ABNORMAL LOW (ref 3.5–5.1)
Sodium: 135 mmol/L (ref 135–145)

## 2019-08-11 LAB — CBC
HCT: 29.7 % — ABNORMAL LOW (ref 39.0–52.0)
Hemoglobin: 9.5 g/dL — ABNORMAL LOW (ref 13.0–17.0)
MCH: 29 pg (ref 26.0–34.0)
MCHC: 32 g/dL (ref 30.0–36.0)
MCV: 90.5 fL (ref 80.0–100.0)
Platelets: 258 10*3/uL (ref 150–400)
RBC: 3.28 MIL/uL — ABNORMAL LOW (ref 4.22–5.81)
RDW: 17.2 % — ABNORMAL HIGH (ref 11.5–15.5)
WBC: 11.5 10*3/uL — ABNORMAL HIGH (ref 4.0–10.5)
nRBC: 0 % (ref 0.0–0.2)

## 2019-08-11 MED ORDER — POTASSIUM CHLORIDE 20 MEQ PO PACK
40.0000 meq | PACK | Freq: Two times a day (BID) | ORAL | Status: DC
  Administered 2019-08-11 – 2019-08-15 (×9): 40 meq via ORAL
  Filled 2019-08-11 (×10): qty 2

## 2019-08-11 MED ORDER — FUROSEMIDE 10 MG/ML IJ SOLN
20.0000 mg | Freq: Two times a day (BID) | INTRAMUSCULAR | Status: DC
  Administered 2019-08-11: 20 mg via INTRAVENOUS
  Filled 2019-08-11 (×2): qty 2

## 2019-08-11 MED ORDER — CHLORHEXIDINE GLUCONATE CLOTH 2 % EX PADS
6.0000 | MEDICATED_PAD | Freq: Every day | CUTANEOUS | Status: AC
  Administered 2019-08-11 – 2019-08-14 (×5): 6 via TOPICAL

## 2019-08-11 MED ORDER — MUPIROCIN 2 % EX OINT
1.0000 "application " | TOPICAL_OINTMENT | Freq: Two times a day (BID) | CUTANEOUS | Status: AC
  Administered 2019-08-11 – 2019-08-15 (×10): 1 via NASAL
  Filled 2019-08-11 (×3): qty 22

## 2019-08-11 NOTE — Progress Notes (Addendum)
PROGRESS NOTE    Cody Mendoza  ZHY:[id number] DOB: June 20, 1921 DOA: 08/10/2019 PCP: Crist Infante, MD   Brief Narrative:  Cody Mendoza is a 86 y.o. male with medical history significant of atrial fibrillation, diverticulosis, hypertension, heart failure diastolic dysfunction with preserved ejection fraction EF 55 to 60% as of April 2021, asthma, GERD.  Patient presents with somewhat of an acute exacerbation of shortness of breath overnight from [facility] facility where he lives with his wife.  Patient indicates shortness of breath is worse when supine, improved with sitting upright as well as worse with exertion.  He states he was feeling quite well last week but over the past few days has began to feel somewhat more tired and fatigued more easily than usual and notes some mild swelling in his lower extremities that appears to be dependent, improving overnight with rest.  More recently the patient indicated severe shortness of breath while laying supine in bed overnight prompting visit to the ED.  ED Course: In ED patient had chest x-ray consistent with bibasilar opacifications concerning for effusion although pneumonia cannot be ruled out, patient denies fevers chills cough or sputum production.  Patient noted also to be moderately anemic with hemoglobin at 7.8 with a baseline around 9 per previous chart review patient has blood ordered in the ED, was given Lasix 40x1 for presumed heart failure exacerbation with symptomatic anemia.  Assessment & Plan:   Active Problems:   GERD   Hypertension   Atrial fibrillation (HCC)   CKD (chronic kidney disease) stage 2, GFR 60-89 ml/min   Chronic anemia   Acute CHF (congestive heart failure) (HCC)   Acute exacerbation of congestive heart failure (HCC)   Hypokalemia  #Acute heart failure exacerbation - Diastolic (preserved EF) heart failure with DOE and LE swelling - He received 40 mg of IV lasix in the ED followed by 20  mg TID on the floor yesterday with ~6 L of UOP - He reports his breathing is improved however he continues to have orthopnea and DOE - Will decrease lasix to 20 mg IV BID - Continue fluid restriction - Strict I/Os - Low salt diet - TTE performed in April so no need for repeat at this time - Will likely need diuretic upon discharge as well as dietary education  #Hypertension - Continue home medications  #CKD - Mild. Stable creatinine - Likely age related senescence  #Atrial fibrillation - Continue metoprolol  - Continue eliquis at 5 mg BID as anticoagulation  #Hypokalemia - 3.4 secondary to diuresis - Giving 40 meq BID today and checking mag in AM - Repeat BMP in AM  #Anemia - Hgb stable after transfusion - repeat CBC in AM   DVT prophylaxis: Eliquis Code Status: DNR Family Communication: Spoke to Patient's son, [name], and discussed the patient's condition as well as plan in depth. All questions answered. Disposition Plan: Anticipate discharge back to [facility] independent living in 48-72 hours pending continued clinical improvement   Consultants:  None   Antimicrobials:None   Subjective: Patient asking for breakfast this morning. He has no complaints otherwise however is hard of hearing. He states if he were to move he would get short of breath. Nursing staff reports intermittent wheezing.  Objective: Vitals:   08/11/19 0451 08/11/19 0457 08/11/19 0515 08/11/19 0629  BP:  (!) 141/85  (!) 159/88  Pulse:  91  87  Resp:  (!) 21  18  Temp:  97.8 F (36.6 C)  97.8  F (36.6 C)  TempSrc:  Oral  Oral  SpO2:  94% 98% 96%  Weight: 75.9 kg     Height:        Intake/Output Summary (Last 24 hours) at 08/11/2019 1022 Last data filed at 08/11/2019 0600 Gross per 24 hour  Intake 150 ml  Output 5350 ml  Net -5200 ml   Filed Weights   08/10/19 1606 08/11/19 0451  Weight: 77.1 kg 75.9 kg    Examination:  General exam: Appears calm and comfortable  Respiratory  system: Clear to auscultation. Respiratory effort normal. Cardiovascular system: S1 & S2 heard, RRR. No JVD, murmurs, rubs, gallops or clicks.1+ LE edema. Gastrointestinal system: Abdomen is nondistended, soft and nontender. No organomegaly or masses felt. Normal bowel sounds heard. Central nervous system: Alert and oriented. No focal neurological deficits. Extremities: Symmetric 5 x 5 power. Skin: No rashes, lesions or ulcers Psychiatry: Judgement and insight appear normal. Mood & affect appropriate.     Data Reviewed: I have personally reviewed following labs and imaging studies  CBC: Recent Labs  Lab 08/10/19 0444 08/11/19 0433  WBC 10.6* 11.5*  NEUTROABS 7.5  --   HGB 7.8* 9.5*  HCT 25.2* 29.7*  MCV 94.7 90.5  PLT 263 161   Basic Metabolic Panel: Recent Labs  Lab 08/10/19 0444 08/11/19 0433  NA 136 135  K 3.8 3.4*  CL 104 96*  CO2 25 29  GLUCOSE 107* 106*  BUN 24* 27*  CREATININE 1.10 1.20  CALCIUM 8.1* 8.4*   GFR: Estimated Creatinine Clearance: 34.4 mL/min (by C-G formula based on SCr of 1.2 mg/dL). Liver Function Tests: No results for input(s): AST, ALT, ALKPHOS, BILITOT, PROT, ALBUMIN in the last 168 hours. No results for input(s): LIPASE, AMYLASE in the last 168 hours. No results for input(s): AMMONIA in the last 168 hours. Coagulation Profile: Recent Labs  Lab 08/10/19 0525  INR 1.7*   Cardiac Enzymes: No results for input(s): CKTOTAL, CKMB, CKMBINDEX, TROPONINI in the last 168 hours. BNP (last 3 results) No results for input(s): PROBNP in the last 8760 hours. HbA1C: No results for input(s): HGBA1C in the last 72 hours. CBG: No results for input(s): GLUCAP in the last 168 hours. Lipid Profile: No results for input(s): CHOL, HDL, LDLCALC, TRIG, CHOLHDL, LDLDIRECT in the last 72 hours. Thyroid Function Tests: No results for input(s): TSH, T4TOTAL, FREET4, T3FREE, THYROIDAB in the last 72 hours. Anemia Panel: No results for input(s): VITAMINB12,  FOLATE, FERRITIN, TIBC, IRON, RETICCTPCT in the last 72 hours. Urine analysis:    Component Value Date/Time   COLORURINE STRAW (A) 07/05/2019 1351   APPEARANCEUR CLEAR 07/05/2019 1351   LABSPEC 1.008 07/05/2019 1351   PHURINE 5.0 07/05/2019 1351   GLUCOSEU NEGATIVE 07/05/2019 1351   HGBUR SMALL (A) 07/05/2019 1351   BILIRUBINUR NEGATIVE 07/05/2019 1351   KETONESUR NEGATIVE 07/05/2019 1351   PROTEINUR NEGATIVE 07/05/2019 1351   UROBILINOGEN 1.0 09/21/2014 1610   NITRITE NEGATIVE 07/05/2019 1351   LEUKOCYTESUR NEGATIVE 07/05/2019 1351   Sepsis Labs: @LABRCNTIP (procalcitonin:4,lacticidven:4)  ) Recent Results (from the past 240 hour(s))  SARS Coronavirus 2 by RT PCR (hospital order, performed in [facility] hospital lab) Nasopharyngeal Nasopharyngeal Swab     Status: None   Collection Time: 08/10/19  4:44 AM   Specimen: Nasopharyngeal Swab  Result Value Ref Range Status   SARS Coronavirus 2 NEGATIVE NEGATIVE Final    Comment: (NOTE) SARS-CoV-2 target nucleic acids are NOT DETECTED. The SARS-CoV-2 RNA is generally detectable in upper and lower  respiratory specimens during the acute phase of infection. The lowest concentration of SARS-CoV-2 viral copies this assay can detect is 250 copies / mL. A negative result does not preclude SARS-CoV-2 infection and should not be used as the sole basis for treatment or other patient management decisions.  A negative result may occur with improper specimen collection / handling, submission of specimen other than nasopharyngeal swab, presence of viral mutation(s) within the areas targeted by this assay, and inadequate number of viral copies (<250 copies / mL). A negative result must be combined with clinical observations, patient history, and epidemiological information. Fact Sheet for Patients:   StrictlyIdeas.no Fact Sheet for Healthcare Providers: BankingDealers.co.za This test is not yet  approved or cleared  by the [place] FDA and has been authorized for detection and/or diagnosis of SARS-CoV-2 by FDA under an Emergency Use Authorization (EUA).  This EUA will remain in effect (meaning this test can be used) for the duration of the COVID-19 declaration under Section 564(b)(1) of the Act, 21 U.S.C. section 360bbb-3(b)(1), unless the authorization is terminated or revoked sooner. Performed at [facility], [place] [address], [place], [place] [address]   MRSA PCR Screening     Status: Abnormal   Collection Time: 08/10/19  6:40 PM   Specimen: Nasopharyngeal  Result Value Ref Range Status   MRSA by PCR POSITIVE (A) NEGATIVE Final    Comment:        The GeneXpert MRSA Assay (FDA approved for NASAL specimens only), is one component of a comprehensive MRSA colonization surveillance program. It is not intended to diagnose MRSA infection nor to guide or monitor treatment for MRSA infections. RESULT CALLED TO, READ BACK BY AND VERIFIED WITH: Judithann Graves @ 2200 08/11/2019 PERRY, J. Performed at [facility], [place] [address], [place], [place] [address]          Radiology Studies: DG Chest Portable 1 View  Result Date: 08/10/2019 CLINICAL DATA:  Patient states shortness of breath x1.5 hours. Patient lives at [facility] independent [facility]. Patient 92% RA with expiratory wheezing EXAM: PORTABLE CHEST 1 VIEW COMPARISON:  07/05/2019 FINDINGS: Cardiac silhouette is mildly enlarged. No mediastinal or hilar masses. There is vascular congestion, mild interstitial thickening and hazy opacity at both lung bases, the latter finding obscuring hemidiaphragms consistent with pleural effusions and atelectasis. No pneumothorax. Skeletal structures are grossly intact. IMPRESSION: 1. Findings support congestive heart failure associated with small effusions. Lung base pneumonia is not excluded and should be considered in the proper clinical setting.  Electronically Signed   By: Lajean Manes M.D.   On: 08/10/2019 05:49        Scheduled Meds: . amLODipine  5 mg Oral QPM  . apixaban  5 mg Oral BID  . Chlorhexidine Gluconate Cloth  6 each Topical Q0600  . doxazosin  2 mg Oral Daily  . furosemide  20 mg Intravenous BID  . latanoprost  1 drop Both Eyes QHS  . metoprolol succinate  25 mg Oral Daily  . mirtazapine  15 mg Oral QHS  . multivitamin with minerals  1 tablet Oral Q supper  . mupirocin ointment  1 application Nasal BID  . pantoprazole  40 mg Oral q morning - 10a  . polyvinyl alcohol  1 drop Both Eyes QHS  . potassium chloride  40 mEq Oral BID  . vitamin B-12  500 mcg Oral q morning - 10a   Continuous Infusions: . sodium chloride Stopped (08/10/19 3976)  LOS: 1 day    Arlan Organ, DO [facility]   If 7PM-7AM, please contact night-coverage [email or web address]  08/11/2019, 10:22 AM

## 2019-08-11 NOTE — Evaluation (Signed)
Physical Therapy Evaluation Patient Details Name: Cody Mendoza MRN: [id number] DOB: [birth year] Today's Date: 08/11/2019   History of Present Illness  86 yo male admitted with CHF. Hx of A fib, TIA, HF, inability to ambulate.  Clinical Impression  On eval, pt required Mod assist to roll, with use of bedrails, and Mod-Max assist for supine<>sit transfers. Pt sat EOB for a few minutes before returning to supine. Noted wheezing and dyspnea with activity. Pt has little to no functional use of bil LEs. Per chart review, there is some sort of "lift" equipment that is used to assist him to/from wheelchair. Pt is very HOH and unable to provide PLOF info. No family present at time of eval. At this time, recommendation is for SNF. However, if pt has 24/7 care and they are able to provide current level of assist he may be able to return home. Will continue to follow and progress activity as tolerated.     Follow Up Recommendations SNF(HHPT, 24/7 care if family declines placement)    Equipment Recommendations  None recommended by PT    Recommendations for Other Services       Precautions / Restrictions Precautions Precautions: Fall Restrictions Weight Bearing Restrictions: No      Mobility  Bed Mobility Overal bed mobility: Needs Assistance Bed Mobility: Rolling;Sidelying to Sit;Sit to Supine Rolling: Mod assist Sidelying to sit: Max assist;(+2 for safety)   Sit to supine: Mod assist;(+2 for safety)   General bed mobility comments: Rolled to L side with assistance of pad to fully complete roll. Assist for trunk and bil LEs for supine<>sit transfers. Sat EOB for ~3 minutes with Min guard assist. +2 for safety on today.   Transfers                 General transfer comment: NT-unable to safely attempt  Ambulation/Gait                Stairs            Wheelchair Mobility    Modified Rankin (Stroke Patients Only)       Balance Overall balance assessment:  Needs assistance Sitting-balance support: Bilateral upper extremity supported Sitting balance-Leahy Scale: Fair                                       Pertinent Vitals/Pain Pain Assessment: Faces Faces Pain Scale: No hurt    Home Living Family/patient expects to be discharged to:: Private residence     Type of Home: Independent living facility Home Access: Level entry     Home Layout: One level Home Equipment: Hand held shower head;Shower seat - built in;Wheelchair - manual      Prior Function Level of Independence: Needs assistance   Gait / Transfers Assistance Needed: per chart info from last admission in 06/2019-"uses equipment at facility to transfer... to/from [facility]"     Comments: pt is very HOH! Unable to provide any PLOF info     Hand Dominance        Extremity/Trunk Assessment   Upper Extremity Assessment Upper Extremity Assessment: Defer to OT evaluation    Lower Extremity Assessment Lower Extremity Assessment: RLE deficits/detail;LLE deficits/detail(little to no functional use of bil LEs) RLE Deficits / Details: knee ext 1/5. swelling in foot LLE Deficits / Details: knee ext 2/5       Communication   Communication: HOH  Cognition Arousal/Alertness: Awake/alert  Behavior During Therapy: WFL for tasks assessed/performed Overall Cognitive Status: Difficult to assess                                        General Comments      Exercises     Assessment/Plan    PT Assessment Patient needs continued PT services  PT Problem List Decreased mobility;Decreased strength;Decreased balance;Decreased activity tolerance;Decreased knowledge of use of DME       PT Treatment Interventions DME instruction;Therapeutic activities;Therapeutic exercise;Patient/family education;Balance training;Functional mobility training    PT Goals (Current goals can be found in the Care Plan section)  Acute Rehab PT Goals Patient Stated Goal:  none stated PT Goal Formulation: Patient unable to participate in goal setting Time For Goal Achievement: 08/25/19 Potential to Achieve Goals: Fair    Frequency Min 3X/week   Barriers to discharge        Co-evaluation               AM-PAC PT "6 Clicks" Mobility  Outcome Measure Help needed turning from your back to your side while in a flat bed without using bedrails?: A Lot Help needed moving from lying on your back to sitting on the side of a flat bed without using bedrails?: A Lot Help needed moving to and from a bed to a chair (including a wheelchair)?: Total Help needed standing up from a chair using your arms (e.g., wheelchair or bedside chair)?: Total Help needed to walk in hospital room?: Total Help needed climbing 3-5 steps with a railing? : Total 6 Click Score: 8    End of Session   Activity Tolerance: Patient tolerated treatment well Patient left: in bed;with call bell/phone within reach;with bed alarm set(with NT to return to complete bath/change linens)   PT Visit Diagnosis: Muscle weakness (generalized) (M62.81);Other abnormalities of gait and mobility (R26.89)    Time: 5277-8242 PT Time Calculation (min) (ACUTE ONLY): 16 min   Charges:   PT Evaluation $PT Eval Low Complexity: 1 Low           Lakya Schrupp P, PT Acute Rehabilitation

## 2019-08-11 NOTE — Evaluation (Addendum)
Occupational Therapy Evaluation Patient Details Name: Cody Mendoza MRN: [id number] DOB: 06-30-1921 Today's Date: 08/11/2019    History of Present Illness 86 yo male admitted with CHF. Hx of A fib, TIA, HF, inability to ambulate.   Clinical Impression   Cody Mendoza is a 86 year old man admitted to hospital with CHF. On evaluation patient demonstrates generalized weakness and poor activity tolerance resulting in decreased ability to perform his baseline transfers (supine to sit and use of a standing lift for transfers to wc and toilet). Patient max-total assist for ADLs at baseline. Patient will benefit from skilled OT services to improve deficits and functional mobility, maintain current ability to assist with upper body ADLs and return patient to baseline to reduce caregiver burden.     Follow Up Recommendations  Home health OT;SNF  If facility can manage patient's current status - recommend return [facility] with [facility].    Equipment Recommendations  None recommended by OT    Recommendations for Other Services       Precautions / Restrictions Precautions Precautions: Fall Restrictions Weight Bearing Restrictions: No      Mobility Bed Mobility Overal bed mobility: Needs Assistance Bed Mobility: Sit to Supine;Supine to Sit Rolling: Mod assist Sidelying to sit: Max assist;+2 for safety/equipment Supine to sit: Min assist Sit to supine: Max assist   General bed mobility comments: MOd assist to transfer to side of bed needing assistance for bilateral lower extremities (reports needing more assistance due to knee pain from recent fall). Patient sat at side of bed for grooming tasks. Max assist to return to supine and position in bed.  Transfers                 General transfer comment: NT-unable to safely attempt    Balance Overall balance assessment: Needs assistance Sitting-balance support: No upper extremity supported;Feet supported Sitting balance-Leahy Scale:  Good                                     ADL either performed or assessed with clinical judgement   ADL Overall ADL's : Needs assistance/impaired Eating/Feeding: Set up;Sitting   Grooming: Set up;Oral care;Wash/dry face;Wash/dry hands;Sitting   Upper Body Bathing: Set up;Bed level;Moderate assistance   Lower Body Bathing: Total assistance;Bed level   Upper Body Dressing : Set up;Moderate assistance;Bed level   Lower Body Dressing: Total assistance;Bed level   Toilet Transfer: Total assistance Toilet Transfer Details (indicate cue type and reason): Standing lift for toilet transfers. Toileting- Clothing Manipulation and Hygiene: Total assistance;Sit to/from stand               Vision Baseline Vision/History: (decreased vision.) Patient Visual Report: No change from baseline       Perception     Praxis      Pertinent Vitals/Pain Pain Assessment: Faces Faces Pain Scale: Hurts little more Pain Location: B Knees Pain Descriptors / Indicators: Grimacing Pain Intervention(s): Limited activity within patient's tolerance     Hand Dominance     Extremity/Trunk Assessment Upper Extremity Assessment Upper Extremity Assessment: Overall WFL for tasks assessed   Lower Extremity Assessment Lower Extremity Assessment: Defer to PT evaluation RLE Deficits / Details: knee ext 1/5. swelling in foot LLE Deficits / Details: knee ext 2/5       Communication Communication Communication: HOH   Cognition Arousal/Alertness: Awake/alert Behavior During Therapy: WFL for tasks assessed/performed Overall Cognitive Status: Within Functional  Limits for tasks assessed                                 General Comments: Patient [facility]   General Comments       Exercises     Shoulder Instructions      Home Living Family/patient expects to be discharged to:: Private residence Living Arrangements: Spouse/significant other   Type of Home: Independent  living facility Home Access: Level entry     Home Layout: One level         Bathroom Toilet: Handicapped height     Home Equipment: Hand held shower head;Shower seat - built in;Wheelchair - manual          Prior Functioning/Environment Level of Independence: Needs assistance  Gait / Transfers Assistance Needed: Per patient they use a standing lift to transer him to wc and toilet. Reports aids/caregivers come every 2 hrs to assist him as needed. ADL's / Homemaking Assistance Needed: Total assist for lower body dressing, bathing and toileting. Able to perform upper body ADLs if he needs to but usually doesn't.   Comments: pt is very HOH! Unable to provide any PLOF info        OT Problem List: Decreased activity tolerance;Decreased strength;Pain      OT Treatment/Interventions:      OT Goals(Current goals can be found in the care plan section) Acute Rehab OT Goals Patient Stated Goal: to go back home at dc OT Goal Formulation: With patient Time For Goal Achievement: 08/25/19 ADL Goals Pt Will Perform Grooming: with supervision;with set-up Pt/caregiver will Perform Home Exercise Program: Both right and left upper extremity;Increased strength Additional ADL Goal #1: Patient will tolerate transfer to toilet/BSC with standing lift Additional ADL Goal #2: Patient will perform supine to sit with min assist in preparation for sitting grooming task  OT Frequency:     Barriers to D/C:            Co-evaluation              AM-PAC OT "6 Clicks" Daily Activity     Outcome Measure Help from another person eating meals?: None Help from another person taking care of personal grooming?: A Little Help from another person toileting, which includes using toliet, bedpan, or urinal?: Total Help from another person bathing (including washing, rinsing, drying)?: A Lot Help from another person to put on and taking off regular upper body clothing?: A Lot Help from another person to  put on and taking off regular lower body clothing?: Total 6 Click Score: 13   End of Session Nurse Communication: Mobility status  Activity Tolerance: Patient limited by fatigue Patient left: in bed;with call bell/phone within reach;with bed alarm set  OT Visit Diagnosis: Muscle weakness (generalized) (M62.81);Pain Pain - part of body: Knee                Time: 2706-2376 OT Time Calculation (min): 15 min Charges:  OT General Charges $OT Visit: 1 Visit OT Evaluation $OT Eval Moderate Complexity: 1 Mod  Besnik Febus, OTR/L [place]  Office [phone]   Lenward Chancellor 08/11/2019, 1:27 PM

## 2019-08-12 DIAGNOSIS — I509 Heart failure, unspecified: Secondary | ICD-10-CM

## 2019-08-12 DIAGNOSIS — I4891 Unspecified atrial fibrillation: Secondary | ICD-10-CM

## 2019-08-12 DIAGNOSIS — D649 Anemia, unspecified: Secondary | ICD-10-CM

## 2019-08-12 LAB — CBC
HCT: 30.4 % — ABNORMAL LOW (ref 39.0–52.0)
Hemoglobin: 9.7 g/dL — ABNORMAL LOW (ref 13.0–17.0)
MCH: 29.5 pg (ref 26.0–34.0)
MCHC: 31.9 g/dL (ref 30.0–36.0)
MCV: 92.4 fL (ref 80.0–100.0)
Platelets: 248 10*3/uL (ref 150–400)
RBC: 3.29 MIL/uL — ABNORMAL LOW (ref 4.22–5.81)
RDW: 17 % — ABNORMAL HIGH (ref 11.5–15.5)
WBC: 9.6 10*3/uL (ref 4.0–10.5)
nRBC: 0 % (ref 0.0–0.2)

## 2019-08-12 LAB — BASIC METABOLIC PANEL
Anion gap: 8 (ref 5–15)
BUN: 38 mg/dL — ABNORMAL HIGH (ref 8–23)
CO2: 32 mmol/L (ref 22–32)
Calcium: 8.1 mg/dL — ABNORMAL LOW (ref 8.9–10.3)
Chloride: 94 mmol/L — ABNORMAL LOW (ref 98–111)
Creatinine, Ser: 1.51 mg/dL — ABNORMAL HIGH (ref 0.61–1.24)
GFR calc Af Amer: 45 mL/min — ABNORMAL LOW (ref >60)
GFR calc non Af Amer: 39 mL/min — ABNORMAL LOW (ref >60)
Glucose, Bld: 114 mg/dL — ABNORMAL HIGH (ref 70–99)
Potassium: 4.1 mmol/L (ref 3.5–5.1)
Sodium: 134 mmol/L — ABNORMAL LOW (ref 135–145)

## 2019-08-12 LAB — TROPONIN I (HIGH SENSITIVITY): Troponin I (High Sensitivity): 9 ng/L (ref <18)

## 2019-08-12 LAB — MAGNESIUM: Magnesium: 1.6 mg/dL — ABNORMAL LOW (ref 1.7–2.4)

## 2019-08-12 MED ORDER — MAGNESIUM SULFATE 2 GM/50ML IV SOLN
2.0000 g | Freq: Once | INTRAVENOUS | Status: AC
  Administered 2019-08-12: 2 g via INTRAVENOUS
  Filled 2019-08-12: qty 50

## 2019-08-12 NOTE — Progress Notes (Addendum)
PROGRESS NOTE    Cody Mendoza  HUD:[id number] DOB: 16-Apr-1921 DOA: 08/10/2019 PCP: Crist Infante, MD   Brief Narrative:  Cody Mendoza is a 86 y.o. male with medical history significant of atrial fibrillation, diverticulosis, hypertension, heart failure diastolic dysfunction with preserved ejection fraction EF 55 to 60% as of April 2021, asthma, GERD.  Patient presented with somewhat of an acute exacerbation of shortness of breath overnight from [facility] where he lives with his wife.  Patient indicates shortness of breath is worse when supine, improved with sitting upright as well as worse with exertion.  He states he was feeling quite well last week but over the past few days has began to feel somewhat more tired and fatigued more easily than usual and notes some swelling in his lower extremities that appears to be dependent, improving overnight with rest.  More recently the patient indicated severe shortness of breath while laying supine in bed overnight prompting visit to the ED.  ED Course: In ED patient had chest x-ray consistent with bibasilar opacifications concerning for effusion although pneumonia cannot be ruled out, patient denies fevers chills cough or sputum production. Admission labs showed anemia with hemoglobin at 7.8 with a baseline around 9 per previous chart review. Blood ordered in the ED, was given Lasix 40x1 for presumed heart failure exacerbation with symptomatic anemia.  Assessment & Plan:   Active Problems:   GERD   Hypertension   Atrial fibrillation (HCC)   CKD (chronic kidney disease) stage 2, GFR 60-89 ml/min   Chronic anemia   Acute CHF (congestive heart failure) (HCC)   Acute exacerbation of congestive heart failure (HCC)   Hypokalemia  #Acute heart failure exacerbation - Diastolic (preserved EF) heart failure with DOE and LE swelling - He received 40 mg of IV lasix in the ED followed by 20 mg TID with ~6 L of UOP - He  reports his breathing is improved however continued to have orthopnea and DOE -Currently lasix decreased to 20 mg IV BID -However, today BUN/creatinine increased to 1.51. -We will hold Lasix for now. - Continue fluid restriction - Strict I/Os - Low salt diet - TTE performed in April so no need for repeat at this time -Continue to monitor BUN/creatinine.  #Hypertension - Continue to monitor blood pressure and adjust medications accordingly.  #CKD -Creatinine increased today to 1.51. -Holding Lasix.  Continue to monitor.  #Atrial fibrillation - Continue metoprolol  - Continue eliquis for anticoagulation  #Hypokalemia/hypomagnesemia -Replaced.  Potassium level today 4.1.  However, magnesium low at 1.6.  Ordered magnesium replacement. - Repeat BMP and magnesium in AM  #Anemia - Hgb stable after transfusion -Monitor CBC   DVT prophylaxis: Eliquis Code Status: DNR Family Communication: Discussed with son over the phone Disposition Plan: Anticipate discharge back to [facility] in 48-72 hours pending continued clinical improvement   Consultants:  None   Antimicrobials:None   Subjective: He is hard of hearing. He states if he were to move he would get short of breath.  This morning he says his shortness of breath is improving.  Continues to have pedal edema. Objective: Vitals:   08/11/19 1747 08/11/19 2121 08/12/19 0500 08/12/19 0523  BP: 123/62 (!) 109/59  (!) 164/69  Pulse: 75 64  87  Resp: 16 18  (!) 22  Temp: 98.2 F (36.8 C) 98.2 F (36.8 C)  97.6 F (36.4 C)  TempSrc: Oral Oral  Oral  SpO2: 95% 93%  98%  Weight:   73.7 kg   Height:        Intake/Output Summary (Last 24 hours) at 08/12/2019 0954 Last data filed at 08/11/2019 1841 Gross per 24 hour  Intake --  Output 1300 ml  Net -1300 ml   Filed Weights   08/10/19 1606 08/11/19 0451 08/12/19 0500  Weight: 77.1 kg 75.9 kg 73.7 kg    Examination:  General exam: Appears calm and  comfortable  Respiratory system: Decreased breath sounds lower lobes, no rhonchi or wheezing Cardiovascular system: S1 & S2 heard, RRR. No murmur.1+ LE edema. Gastrointestinal system: Abdomen is nondistended, soft and nontender. No masses felt. Normal bowel sounds heard. Central nervous system: Alert and oriented.  Has debility with generalized weakness otherwise no focal neurological deficits. Extremities: Symmetric, pedal edema Skin: No rashes, lesions or ulcers Psychiatry: Judgement and insight appear normal. Mood & affect appropriate.     Data Reviewed: I have personally reviewed following labs and imaging studies  CBC: Recent Labs  Lab 08/10/19 0444 08/11/19 0433 08/12/19 0256  WBC 10.6* 11.5* 9.6  NEUTROABS 7.5  --   --   HGB 7.8* 9.5* 9.7*  HCT 25.2* 29.7* 30.4*  MCV 94.7 90.5 92.4  PLT 263 258 539   Basic Metabolic Panel: Recent Labs  Lab 08/10/19 0444 08/11/19 0433 08/12/19 0256  NA 136 135 134*  K 3.8 3.4* 4.1  CL 104 96* 94*  CO2 25 29 32  GLUCOSE 107* 106* 114*  BUN 24* 27* 38*  CREATININE 1.10 1.20 1.51*  CALCIUM 8.1* 8.4* 8.1*  MG  --   --  1.6*   GFR: Estimated Creatinine Clearance: 27.4 mL/min (A) (by C-G formula based on SCr of 1.51 mg/dL (H)). Liver Function Tests: No results for input(s): AST, ALT, ALKPHOS, BILITOT, PROT, ALBUMIN in the last 168 hours. No results for input(s): LIPASE, AMYLASE in the last 168 hours. No results for input(s): AMMONIA in the last 168 hours. Coagulation Profile: Recent Labs  Lab 08/10/19 0525  INR 1.7*   Cardiac Enzymes: No results for input(s): CKTOTAL, CKMB, CKMBINDEX, TROPONINI in the last 168 hours. BNP (last 3 results) No results for input(s): PROBNP in the last 8760 hours. HbA1C: No results for input(s): HGBA1C in the last 72 hours. CBG: No results for input(s): GLUCAP in the last 168 hours. Lipid Profile: No results for input(s): CHOL, HDL, LDLCALC, TRIG, CHOLHDL, LDLDIRECT in the last 72  hours. Thyroid Function Tests: No results for input(s): TSH, T4TOTAL, FREET4, T3FREE, THYROIDAB in the last 72 hours. Anemia Panel: No results for input(s): VITAMINB12, FOLATE, FERRITIN, TIBC, IRON, RETICCTPCT in the last 72 hours. Urine analysis:    Component Value Date/Time   COLORURINE STRAW (A) 07/05/2019 1351   APPEARANCEUR CLEAR 07/05/2019 1351   LABSPEC 1.008 07/05/2019 1351   PHURINE 5.0 07/05/2019 1351   GLUCOSEU NEGATIVE 07/05/2019 1351   HGBUR SMALL (A) 07/05/2019 1351   BILIRUBINUR NEGATIVE 07/05/2019 1351   KETONESUR NEGATIVE 07/05/2019 1351   PROTEINUR NEGATIVE 07/05/2019 1351   UROBILINOGEN 1.0 09/21/2014 1610   NITRITE NEGATIVE 07/05/2019 1351   LEUKOCYTESUR NEGATIVE 07/05/2019 1351   Sepsis Labs: @LABRCNTIP (procalcitonin:4,lacticidven:4)  ) Recent Results (from the past 240 hour(s))  SARS Coronavirus 2 by RT PCR (hospital order, performed in [employer] hospital lab) Nasopharyngeal Nasopharyngeal Swab     Status: None   Collection Time: 08/10/19  4:44 AM   Specimen: Nasopharyngeal Swab  Result Value Ref Range Status   SARS Coronavirus 2 NEGATIVE NEGATIVE Final  Comment: (NOTE) SARS-CoV-2 target nucleic acids are NOT DETECTED. The SARS-CoV-2 RNA is generally detectable in upper and lower respiratory specimens during the acute phase of infection. The lowest concentration of SARS-CoV-2 viral copies this assay can detect is 250 copies / mL. A negative result does not preclude SARS-CoV-2 infection and should not be used as the sole basis for treatment or other patient management decisions.  A negative result may occur with improper specimen collection / handling, submission of specimen other than nasopharyngeal swab, presence of viral mutation(s) within the areas targeted by this assay, and inadequate number of viral copies (<250 copies / mL). A negative result must be combined with clinical observations, patient history, and epidemiological information. Fact  Sheet for Patients:   StrictlyIdeas.no Fact Sheet for Healthcare Providers: BankingDealers.co.za This test is not yet approved or cleared  by the [place] FDA and has been authorized for detection and/or diagnosis of SARS-CoV-2 by FDA under an Emergency Use Authorization (EUA).  This EUA will remain in effect (meaning this test can be used) for the duration of the COVID-19 declaration under Section 564(b)(1) of the Act, 21 U.S.C. section 360bbb-3(b)(1), unless the authorization is terminated or revoked sooner. Performed at [facility], [place] [address] [place], [place] [address]   MRSA PCR Screening     Status: Abnormal   Collection Time: 08/10/19  6:40 PM   Specimen: Nasopharyngeal  Result Value Ref Range Status   MRSA by PCR POSITIVE (A) NEGATIVE Final    Comment:        The GeneXpert MRSA Assay (FDA approved for NASAL specimens only), is one component of a comprehensive MRSA colonization surveillance program. It is not intended to diagnose MRSA infection nor to guide or monitor treatment for MRSA infections. RESULT CALLED TO, READ BACK BY AND VERIFIED WITH: Judithann Graves @ 2200 08/11/2019 PERRY, J. Performed at [facility], [place] [address], [place], [place] [address]          Radiology Studies: No results found.      Scheduled Meds: . amLODipine  5 mg Oral QPM  . apixaban  5 mg Oral BID  . Chlorhexidine Gluconate Cloth  6 each Topical Q0600  . doxazosin  2 mg Oral Daily  . furosemide  20 mg Intravenous BID  . latanoprost  1 drop Both Eyes QHS  . metoprolol succinate  25 mg Oral Daily  . mirtazapine  15 mg Oral QHS  . multivitamin with minerals  1 tablet Oral Q supper  . mupirocin ointment  1 application Nasal BID  . pantoprazole  40 mg Oral q morning - 10a  . polyvinyl alcohol  1 drop Both Eyes QHS  . potassium chloride  40 mEq Oral BID  . vitamin B-12  500 mcg Oral q  morning - 10a   Continuous Infusions: . sodium chloride Stopped (08/10/19 0643)     LOS: 2 days    Cody Guthrie, MD [facility] Pager on amion  If 7PM-7AM, please contact night-coverage [email or web address]  08/12/2019, 9:54 AM

## 2019-08-12 NOTE — Progress Notes (Signed)
Occupational Therapy Treatment Patient Details Name: Cody Mendoza MRN: [id number] DOB: 12/17/1921 Today's Date: 08/12/2019    History of present illness 86 yo male admitted with CHF. Hx of A fib, TIA, HF, inability to ambulate.   OT comments  Pt sat EOB for approx 14 min with OT with grooming activity.  Pt will need max- total A with ADL activity at ALF  Follow Up Recommendations  Home health OT;SNF;Supervision/Assistance - 24 hour(24/7 A at ALF)    Equipment Recommendations  None recommended by OT    Recommendations for Other Services      Precautions / Restrictions Precautions Precautions: Fall Restrictions Weight Bearing Restrictions: No       Mobility Bed Mobility Overal bed mobility: Needs Assistance Bed Mobility: Sit to Supine;Supine to Sit     Supine to sit: Mod assist Sit to supine: Mod assist      Transfers                      Balance   Sitting-balance support: No upper extremity supported;Feet supported Sitting balance-Leahy Scale: Good                                     ADL either performed or assessed with clinical judgement   ADL Overall ADL's : Needs assistance/impaired Eating/Feeding: Set up;Sitting   Grooming: Set up;Oral care;Wash/dry face;Wash/dry hands;Sitting                                 General ADL Comments: Upon sitting EOB pt with significant wheezing. RN called and Aed pt to supine. Wheezing improved     Vision Patient Visual Report: No change from baseline            Cognition Arousal/Alertness: Awake/alert Behavior During Therapy: WFL for tasks assessed/performed Overall Cognitive Status: Within Functional Limits for tasks assessed                                 General Comments: Patient HOH                   Pertinent Vitals/ Pain       Pain Assessment: No/denies pain         Frequency           Progress Toward Goals  OT Goals(current  goals can now be found in the care plan section)  Progress towards OT goals: Progressing toward goals     Plan Discharge plan remains appropriate       AM-PAC OT "6 Clicks" Daily Activity     Outcome Measure   Help from another person eating meals?: None Help from another person taking care of personal grooming?: A Little Help from another person toileting, which includes using toliet, bedpan, or urinal?: Total Help from another person bathing (including washing, rinsing, drying)?: A Lot Help from another person to put on and taking off regular upper body clothing?: A Lot Help from another person to put on and taking off regular lower body clothing?: Total 6 Click Score: 13    End of Session    OT Visit Diagnosis: Muscle weakness (generalized) (M62.81);Pain Pain - part of body: Knee   Activity Tolerance Patient limited by fatigue   Patient Left in bed;with call  bell/phone within reach;with bed alarm set   Nurse Communication Mobility status        Time: 7353-2992 OT Time Calculation (min): 18 min  Charges: OT General Charges $OT Visit: 1 Visit OT Treatments $Self Care/Home Management : 8-22 mins  Kari Baars, [place] [phone] Office- [phone], Edwena Felty D 08/12/2019, 3:49 PM

## 2019-08-12 NOTE — TOC Progression Note (Signed)
Transition of Care [facility]) - Progression Note    Patient Details  Name: Cody Mendoza MRN: [id number] Date of Birth: 1922/01/18  [facility]) CM/SW Contact  Floetta Brickey, Juliann Pulse, RN Phone Number: 08/12/2019, 2:53 PM  Clinical Narrative: spoke to son [name] about d/c plans-[facility] was following for nursing-wound care. If no nursing for wound care needed,then [facility] contract @ [facility] can provide PT/OT if ordered. Patient is w/c bound. PTAR needed @ d/c.DNR.           Expected Discharge Plan and Services                                                 Social Determinants of Health (SDOH) Interventions    Readmission Risk Interventions No flowsheet data found.

## 2019-08-12 NOTE — Progress Notes (Signed)
Paged on call about 5 beat vtach run awaiting new orders if any. Will continue to monitor.

## 2019-08-12 NOTE — TOC Progression Note (Signed)
Transition of Care [facility]) - Progression Note    Patient Details  Name: Cody Mendoza MRN: [id number] Date of Birth: October 31, 1921  [facility]) CM/SW Contact  Purcell Mouton, RN Phone Number: 08/12/2019, 11:47 AM  Clinical Narrative:    Spoke with pt's son concerning discharge plan. Pt will discharge back to [facility] with [facility] and [place]. MD will need HHRN/NA/PT orders. Thanks        Expected Discharge Plan and Services                                                 Social Determinants of Health (SDOH) Interventions    Readmission Risk Interventions No flowsheet data found.

## 2019-08-13 DIAGNOSIS — Z515 Encounter for palliative care: Secondary | ICD-10-CM

## 2019-08-13 DIAGNOSIS — Z7189 Other specified counseling: Secondary | ICD-10-CM

## 2019-08-13 DIAGNOSIS — Z66 Do not resuscitate: Secondary | ICD-10-CM

## 2019-08-13 LAB — CBC
HCT: 30.5 % — ABNORMAL LOW (ref 39.0–52.0)
Hemoglobin: 9.5 g/dL — ABNORMAL LOW (ref 13.0–17.0)
MCH: 29.1 pg (ref 26.0–34.0)
MCHC: 31.1 g/dL (ref 30.0–36.0)
MCV: 93.6 fL (ref 80.0–100.0)
Platelets: 237 10*3/uL (ref 150–400)
RBC: 3.26 MIL/uL — ABNORMAL LOW (ref 4.22–5.81)
RDW: 16.7 % — ABNORMAL HIGH (ref 11.5–15.5)
WBC: 8.8 10*3/uL (ref 4.0–10.5)
nRBC: 0 % (ref 0.0–0.2)

## 2019-08-13 LAB — BASIC METABOLIC PANEL
Anion gap: 7 (ref 5–15)
BUN: 40 mg/dL — ABNORMAL HIGH (ref 8–23)
CO2: 28 mmol/L (ref 22–32)
Calcium: 8.3 mg/dL — ABNORMAL LOW (ref 8.9–10.3)
Chloride: 98 mmol/L (ref 98–111)
Creatinine, Ser: 1.45 mg/dL — ABNORMAL HIGH (ref 0.61–1.24)
GFR calc Af Amer: 47 mL/min — ABNORMAL LOW (ref >60)
GFR calc non Af Amer: 41 mL/min — ABNORMAL LOW (ref >60)
Glucose, Bld: 103 mg/dL — ABNORMAL HIGH (ref 70–99)
Potassium: 4.4 mmol/L (ref 3.5–5.1)
Sodium: 133 mmol/L — ABNORMAL LOW (ref 135–145)

## 2019-08-13 LAB — MAGNESIUM: Magnesium: 2 mg/dL (ref 1.7–2.4)

## 2019-08-13 MED ORDER — FUROSEMIDE 20 MG PO TABS
20.0000 mg | ORAL_TABLET | Freq: Every day | ORAL | Status: DC
  Administered 2019-08-13 – 2019-08-17 (×5): 20 mg via ORAL
  Filled 2019-08-13 (×5): qty 1

## 2019-08-13 MED ORDER — LORAZEPAM 2 MG/ML IJ SOLN
0.2500 mg | Freq: Once | INTRAMUSCULAR | Status: AC
  Administered 2019-08-13: 0.25 mg via INTRAVENOUS
  Filled 2019-08-13: qty 1

## 2019-08-13 NOTE — Progress Notes (Signed)
PROGRESS NOTE    Cody Mendoza  QQP:[id number] DOB: 1921/09/01 DOA: 08/10/2019 PCP: Crist Infante, MD   Brief Narrative:  Cody Mendoza is a 86 y.o. male with medical history significant of atrial fibrillation, diverticulosis, hypertension, heart failure diastolic dysfunction with preserved ejection fraction EF 55 to 60% as of April 2021, asthma, GERD.  Patient presented with somewhat of an acute exacerbation of shortness of breath overnight from [facility] independent living facility where he lives with his wife.  Patient indicates shortness of breath is worse when supine, improved with sitting upright as well as worse with exertion.  He states he was feeling quite well last week but over the past few days has began to feel somewhat more tired and fatigued more easily than usual and notes some swelling in his lower extremities that appears to be dependent, improving with rest.  More recently the patient indicated severe shortness of breath while laying supine in bed overnight prompting visit to the ED.  ED Course: In ED patient had chest x-ray consistent with bibasilar opacifications concerning for effusion although pneumonia cannot be ruled out, patient denies fevers chills cough or sputum production. Admission labs showed anemia with hemoglobin at 7.8 with a baseline around 9 per previous chart review. Blood ordered in the ED, was given Lasix 40x1 for presumed heart failure exacerbation with symptomatic anemia.  Assessment & Plan:   Active Problems:   GERD   Hypertension   Atrial fibrillation (HCC)   CKD (chronic kidney disease) stage 2, GFR 60-89 ml/min   Chronic anemia   Acute CHF (congestive heart failure) (HCC)   Acute exacerbation of congestive heart failure (HCC)   Hypokalemia  #Acute heart failure exacerbation - Diastolic (preserved EF) heart failure with DOE and LE swelling - He received 40 mg of IV lasix in the ED followed by 20 mg TID with ~6 L of UOP - He reports  his breathing is improved however continued to have orthopnea and DOE -Currently lasix decreased to 20 mg IV BID -However, on 08/12/2019 BUN/creatinine increased -Therefore held Lasix and continued fluid restriction - Strict I/Os - Low salt diet - TTE performed in April so no need for repeat at this time 6/8: Creatinine improved to 1.45.  He says he still feels short of breath even with minimal movement.  Will start him on 20 p.o. daily of Lasix.  Continue to monitor BUN/creatinine.  #Hypertension - Continue to monitor blood pressure and adjust medications accordingly.  #CKD -Creatinine increased 08/12/2019 to 1.51. -Held Lasix.   6/8: Still has some pedal edema with some shortness of breath on minimal movement.  Will start Lasix 20 mg daily.  Monitor BUN/creatinine.  #Atrial fibrillation - Continue metoprolol  - Continue eliquis for anticoagulation  #Hypokalemia/hypomagnesemia -Replaced.  Potassium level today 4.4.  Magnesium 2.  #Anemia - Hgb stable after transfusion -Monitor CBC   DVT prophylaxis: Eliquis Code Status: DNR Family Communication: Discussed with son at bedside.  After conversation with the son consulted palliative care to discuss goals of care. Disposition Plan: Anticipate discharge back to facility pending continued clinical improvement   Consultants:  None   Antimicrobials:None   Subjective: Continues to have pedal edema.  He says he is okay when he is not moving but reports shortness of breath with minimal movement. Objective: Vitals:   08/12/19 1353 08/12/19 2200 08/13/19 0523 08/13/19 1159  BP: 132/70 136/63 (!) 143/76 131/60  Pulse: 82 80 75 60  Resp: (!) 21 20 18  (!) 22  Temp: 98.2 F (36.8 C) 97.8 F (36.6 C) 98 F (36.7 C) 98.1 F (36.7 C)  TempSrc: Oral Oral Oral Oral  SpO2: 96% 94% 92% 95%  Weight:   73.5 kg   Height:        Intake/Output Summary (Last 24 hours) at 08/13/2019 1402 Last data filed at 08/13/2019 1400 Gross per 24 hour    Intake 1390 ml  Output 875 ml  Net 515 ml   Filed Weights   08/11/19 0451 08/12/19 0500 08/13/19 0523  Weight: 75.9 kg 73.7 kg 73.5 kg    Examination:  General exam: Appears calm and comfortable  Respiratory system: Decreased breath sounds lower lobes, no rhonchi or wheezing, few crackles Cardiovascular system: S1 & S2 heard, RRR. No murmur.1+ LE edema. Gastrointestinal system: Abdomen is nondistended, soft and nontender. No masses felt. Normal bowel sounds heard. Central nervous system: Alert and oriented.  Has debility with generalized weakness otherwise no focal neurological deficits. Extremities: Symmetric, pedal edema Skin: No rashes, lesions or ulcers Psychiatry: Judgement and insight appear normal. Mood & affect appropriate.     Data Reviewed: I have personally reviewed following labs and imaging studies  CBC: Recent Labs  Lab 08/10/19 0444 08/11/19 0433 08/12/19 0256 08/13/19 0620  WBC 10.6* 11.5* 9.6 8.8  NEUTROABS 7.5  --   --   --   HGB 7.8* 9.5* 9.7* 9.5*  HCT 25.2* 29.7* 30.4* 30.5*  MCV 94.7 90.5 92.4 93.6  PLT 263 258 248 578   Basic Metabolic Panel: Recent Labs  Lab 08/10/19 0444 08/11/19 0433 08/12/19 0256 08/13/19 0620  NA 136 135 134* 133*  K 3.8 3.4* 4.1 4.4  CL 104 96* 94* 98  CO2 25 29 32 28  GLUCOSE 107* 106* 114* 103*  BUN 24* 27* 38* 40*  CREATININE 1.10 1.20 1.51* 1.45*  CALCIUM 8.1* 8.4* 8.1* 8.3*  MG  --   --  1.6* 2.0   GFR: Estimated Creatinine Clearance: 28.5 mL/min (A) (by C-G formula based on SCr of 1.45 mg/dL (H)). Liver Function Tests: No results for input(s): AST, ALT, ALKPHOS, BILITOT, PROT, ALBUMIN in the last 168 hours. No results for input(s): LIPASE, AMYLASE in the last 168 hours. No results for input(s): AMMONIA in the last 168 hours. Coagulation Profile: Recent Labs  Lab 08/10/19 0525  INR 1.7*   Cardiac Enzymes: No results for input(s): CKTOTAL, CKMB, CKMBINDEX, TROPONINI in the last 168 hours. BNP  (last 3 results) No results for input(s): PROBNP in the last 8760 hours. HbA1C: No results for input(s): HGBA1C in the last 72 hours. CBG: No results for input(s): GLUCAP in the last 168 hours. Lipid Profile: No results for input(s): CHOL, HDL, LDLCALC, TRIG, CHOLHDL, LDLDIRECT in the last 72 hours. Thyroid Function Tests: No results for input(s): TSH, T4TOTAL, FREET4, T3FREE, THYROIDAB in the last 72 hours. Anemia Panel: No results for input(s): VITAMINB12, FOLATE, FERRITIN, TIBC, IRON, RETICCTPCT in the last 72 hours. Urine analysis:    Component Value Date/Time   COLORURINE STRAW (A) 07/05/2019 1351   APPEARANCEUR CLEAR 07/05/2019 1351   LABSPEC 1.008 07/05/2019 1351   PHURINE 5.0 07/05/2019 1351   GLUCOSEU NEGATIVE 07/05/2019 1351   HGBUR SMALL (A) 07/05/2019 1351   BILIRUBINUR NEGATIVE 07/05/2019 1351   KETONESUR NEGATIVE 07/05/2019 1351   PROTEINUR NEGATIVE 07/05/2019 1351   UROBILINOGEN 1.0 09/21/2014 1610   NITRITE NEGATIVE 07/05/2019 1351   LEUKOCYTESUR NEGATIVE 07/05/2019 1351   Sepsis Labs: @LABRCNTIP (procalcitonin:4,lacticidven:4)  ) Recent Results (from the past 240  hour(s))  SARS Coronavirus 2 by RT PCR (hospital order, performed in [facility] hospital lab) Nasopharyngeal Nasopharyngeal Swab     Status: None   Collection Time: 08/10/19  4:44 AM   Specimen: Nasopharyngeal Swab  Result Value Ref Range Status   SARS Coronavirus 2 NEGATIVE NEGATIVE Final    Comment: (NOTE) SARS-CoV-2 target nucleic acids are NOT DETECTED. The SARS-CoV-2 RNA is generally detectable in upper and lower respiratory specimens during the acute phase of infection. The lowest concentration of SARS-CoV-2 viral copies this assay can detect is 250 copies / mL. A negative result does not preclude SARS-CoV-2 infection and should not be used as the sole basis for treatment or other patient management decisions.  A negative result may occur with improper specimen collection / handling,  submission of specimen other than nasopharyngeal swab, presence of viral mutation(s) within the areas targeted by this assay, and inadequate number of viral copies (<250 copies / mL). A negative result must be combined with clinical observations, patient history, and epidemiological information. Fact Sheet for Patients:   StrictlyIdeas.no Fact Sheet for Healthcare Providers: BankingDealers.co.za This test is not yet approved or cleared  by the [place] FDA and has been authorized for detection and/or diagnosis of SARS-CoV-2 by FDA under an Emergency Use Authorization (EUA).  This EUA will remain in effect (meaning this test can be used) for the duration of the COVID-19 declaration under Section 564(b)(1) of the Act, 21 U.S.C. section 360bbb-3(b)(1), unless the authorization is terminated or revoked sooner. Performed at [facility], [place] [address], [place], [place] [address]   MRSA PCR Screening     Status: Abnormal   Collection Time: 08/10/19  6:40 PM   Specimen: Nasopharyngeal  Result Value Ref Range Status   MRSA by PCR POSITIVE (A) NEGATIVE Final    Comment:        The GeneXpert MRSA Assay (FDA approved for NASAL specimens only), is one component of a comprehensive MRSA colonization surveillance program. It is not intended to diagnose MRSA infection nor to guide or monitor treatment for MRSA infections. RESULT CALLED TO, READ BACK BY AND VERIFIED WITH: Judithann Graves @ 2200 08/11/2019 PERRY, J. Performed at [facility], [place] [address] [place],  [address]          Radiology Studies: No results found.      Scheduled Meds: . amLODipine  5 mg Oral QPM  . apixaban  5 mg Oral BID  . Chlorhexidine Gluconate Cloth  6 each Topical Q0600  . doxazosin  2 mg Oral Daily  . latanoprost  1 drop Both Eyes QHS  . metoprolol succinate  25 mg Oral Daily  . mirtazapine  15 mg Oral QHS   . multivitamin with minerals  1 tablet Oral Q supper  . mupirocin ointment  1 application Nasal BID  . pantoprazole  40 mg Oral q morning - 10a  . polyvinyl alcohol  1 drop Both Eyes QHS  . potassium chloride  40 mEq Oral BID  . vitamin B-12  500 mcg Oral q morning - 10a   Continuous Infusions: . sodium chloride Stopped (08/10/19 0643)     LOS: 3 days    Yaakov Guthrie, MD [facility] Pager on amion  If 7PM-7AM, please contact night-coverage [email or web address]  08/13/2019, 2:02 PM

## 2019-08-13 NOTE — Consult Note (Signed)
Consultation Note Date: 08/13/2019   Patient Name: Cody Mendoza  DOB: Jun 05, 1921  MRN: [id number]  Age / Sex: 86 y.o., male  PCP: Crist Infante, MD Referring Physician: Yaakov Guthrie, MD  Reason for Consultation: Establishing goals of care  HPI/Patient Profile: 86 y.o. male  with past medical history of a fib, diverticulosis, HTN, CHF with EF 55-60%, CKD, and GERD admitted on 08/10/2019 with shortness of breath. CXR revealed bibasilar opacifications concerning for effusion vs pneumonia.  Patient being treated for acute heart failure exacerbation. Creatinine was stopped d/t increased creatinine/BUN. He feels short of breath with minimal movement. PMT consulted for [place].  Clinical Assessment and Goals of Care: I have reviewed medical records including EPIC notes, labs and imaging, received report from RN, assessed the patient and then spoke with patient's son, Cody Mendoza, to discuss diagnosis prognosis, GOC, EOL wishes, disposition and options.  Patient's participation in [place] discussion limited by fatigue and HOH. Patient only has one hearing aid and it is not working well per son.   I introduced Palliative Medicine as specialized medical care for people living with serious illness. It focuses on providing relief from the symptoms and stress of a serious illness. The goal is to improve quality of life for both the patient and the family.  As far as functional and nutritional status, [name] tells me of his dad's decline over the past year. He tells me he has had several ED visits/hospitalizations in the past year and functional status has declined. He became wheelchair dependent in December and his loss of independence has been very difficult for him to accept.  Patient lives in independent living facility with his wife who is 29 years old. [name] tells me she has dementia.   We discussed patient's current illness and what it means in the larger context of  patient's on-going co-morbidities. Natural disease trajectory and expectations at EOL were discussed. We discuss patient's heart failure diagnosis with repeated exacerbations.   I attempted to elicit values and goals of care important to the patient. [name] tells me the patient has expressed to him he is not interested in prolonging life in his current state d/t his loss of independence. He also does not want to continue to come in and out of the hospital.  He would like to return to his ILF with extra support.  [name] asks about the differences between palliative care and hospice care outpatient. These were detailed for him. Discussed philosophy of hospice care. [name] seems to think hospice care is most appropriate but would like time to discuss with other family members - specifically, patients daughter. He is also hopeful to get patient's hearing aid repaired and discuss options with patient. He tells me he may not be able to make a decision prior to patient's discharge. We discussed referring him to palliative care outpatient and they could help assist with hospice transition once family and patient are ready.   Questions and concerns were addressed. The family was encouraged to call with questions or concerns.   Primary Decision Maker PATIENT joined by son, Cody Mendoza   SUMMARY OF RECOMMENDATIONS    - interested in hospice but not sure if will be ready to transition to hospice when patient is ready for discharge - son plans to discuss with other family members and patient in coming days - if not ready for hospice yet at discharge, refer to outpatient palliative to assist with hospice transition outpatient - will follow up - son has my number to  call as well  Code Status/Advance Care Planning:  DNR  Discharge Planning: To Be Determined ILF with hospice vs palliative     Primary Diagnoses: Present on Admission: . Atrial fibrillation ([place]) . Hypertension . CKD (chronic kidney disease) stage  2, GFR 60-89 ml/min . GERD . Chronic anemia   I have reviewed the medical record, interviewed the patient and family, and examined the patient. The following aspects are pertinent.  Past Medical History:  Diagnosis Date  . AF (atrial fibrillation) ([place])   . Asthma   . Detached retina   . Discitis of thoracic region 08/2018  . Diverticulosis   . GERD (gastroesophageal reflux disease)   . [name] syndrome   . Hypertension   . Lactose intolerance   . Mitral insufficiency   . Renal calculi   . TIA (transient ischemic attack)    Social History   Socioeconomic History  . Marital status: Married    Spouse name: Not on file  . Number of children: 2  . Years of education: Not on file  . Highest education Mendoza: Not on file  Occupational History  . Occupation: Careers adviser: RETIRED  Tobacco Use  . Smoking status: Never Smoker  . Smokeless tobacco: Never Used  Substance and Sexual Activity  . Alcohol use: Yes    Alcohol/week: 0.0 standard drinks    Comment: rarely  . Drug use: Never  . Sexual activity: Not on file  Other Topics Concern  . Not on file  Social History Narrative  . Not on file   Social Determinants of Health   Financial Resource Strain:   . Difficulty of Paying Living Expenses:   Food Insecurity:   . Worried About Charity fundraiser in the Last Year:   . Arboriculturist in the Last Year:   Transportation Needs:   . Film/video editor (Medical):   Marland Kitchen Lack of Transportation (Non-Medical):   Physical Activity:   . Days of Exercise per Week:   . Minutes of Exercise per Session:   Stress:   . Feeling of Stress :   Social Connections:   . Frequency of Communication with Friends and Family:   . Frequency of Social Gatherings with Friends and Family:   . Attends Religious Services:   . Active Member of Clubs or Organizations:   . Attends Archivist Meetings:   Marland Kitchen Marital Status:    Family History  Problem Relation  Age of Onset  . Hypertension Mother    Scheduled Meds: . amLODipine  5 mg Oral QPM  . apixaban  5 mg Oral BID  . Chlorhexidine Gluconate Cloth  6 each Topical Q0600  . doxazosin  2 mg Oral Daily  . furosemide  20 mg Oral Daily  . latanoprost  1 drop Both Eyes QHS  . metoprolol succinate  25 mg Oral Daily  . mirtazapine  15 mg Oral QHS  . multivitamin with minerals  1 tablet Oral Q supper  . mupirocin ointment  1 application Nasal BID  . pantoprazole  40 mg Oral q morning - 10a  . polyvinyl alcohol  1 drop Both Eyes QHS  . potassium chloride  40 mEq Oral BID  . vitamin B-12  500 mcg Oral q morning - 10a   Continuous Infusions: . sodium chloride Stopped (08/10/19 0643)   PRN Meds:.acetaminophen **OR** acetaminophen, levalbuterol, ondansetron **OR** ondansetron (ZOFRAN) IV, senna-docusate No Known Allergies Review of Systems  Constitutional: Positive for  activity change and fatigue.  Respiratory: Positive for shortness of breath.   Cardiovascular: Positive for leg swelling.  Neurological: Positive for weakness.    Physical Exam Constitutional:      General: He is not in acute distress. Pulmonary:     Effort: Pulmonary effort is normal.  Musculoskeletal:     Right lower leg: No edema.     Left lower leg: No edema.  Neurological:     Mental Status: He is alert.     Vital Signs: BP 131/60 (BP Location: Right Arm)   Pulse 60   Temp 98.1 F (36.7 C) (Oral)   Resp (!) 22   Ht 5\' 7"  (1.702 m)   Wt 73.5 kg   SpO2 95%   BMI 25.37 kg/m  Pain Scale: 0-10 POSS *See Group Information*: S-Acceptable,Sleep, easy to arouse Pain Score: 0-No pain   SpO2: SpO2: 95 % O2 Device:SpO2: 95 % O2 Flow Rate: .O2 Flow Rate (L/min): 2 L/min  IO: Intake/output summary:   Intake/Output Summary (Last 24 hours) at 08/13/2019 1414 Last data filed at 08/13/2019 1400 Gross per 24 hour  Intake 1390 ml  Output 875 ml  Net 515 ml    LBM: Last BM Date: 08/13/19 Baseline Weight: Weight:  77.1 kg Most recent weight: Weight: 73.5 kg     Palliative Assessment/Data: PPS 40%    Time Total: 50 minutes Greater than 50%  of this time was spent counseling and coordinating care related to the above assessment and plan.  Juel Burrow, DNP, AGNP-C Palliative Medicine Team [phone] Pager: [phone]

## 2019-08-13 NOTE — Care Management Important Message (Signed)
Important Message  Patient Details IM Letter given to [name] RN Case Manager to present to the Patient Name: Cody Mendoza MRN: [id number] Date of Birth: 12/26/1921   Medicare Important Message Given:  Yes     [name] 08/13/2019, 1:26 PM

## 2019-08-13 NOTE — Progress Notes (Signed)
Patient has progressively gotten more disoriented throughout the night, alert and oriented only to self. RN attempted to redirect patient but patient refuses to follow command and constantly trying to get out of bed and hollow from the room disrupting other patient. On-call provider [facility] paged and new order was provided. RN carried out order. Patient cleaned up and placed in comfortable position.,bed alarm on, bed in lowest position and call-light within reach .Will continue to monitor.

## 2019-08-14 ENCOUNTER — Ambulatory Visit: Payer: Medicare Other | Admitting: Internal Medicine

## 2019-08-14 DIAGNOSIS — E876 Hypokalemia: Secondary | ICD-10-CM

## 2019-08-14 LAB — CBC
HCT: 30.9 % — ABNORMAL LOW (ref 39.0–52.0)
Hemoglobin: 9.9 g/dL — ABNORMAL LOW (ref 13.0–17.0)
MCH: 29.6 pg (ref 26.0–34.0)
MCHC: 32 g/dL (ref 30.0–36.0)
MCV: 92.5 fL (ref 80.0–100.0)
Platelets: 273 10*3/uL (ref 150–400)
RBC: 3.34 MIL/uL — ABNORMAL LOW (ref 4.22–5.81)
RDW: 16.5 % — ABNORMAL HIGH (ref 11.5–15.5)
WBC: 9.6 10*3/uL (ref 4.0–10.5)
nRBC: 0 % (ref 0.0–0.2)

## 2019-08-14 LAB — BASIC METABOLIC PANEL
Anion gap: 9 (ref 5–15)
BUN: 41 mg/dL — ABNORMAL HIGH (ref 8–23)
CO2: 26 mmol/L (ref 22–32)
Calcium: 8.7 mg/dL — ABNORMAL LOW (ref 8.9–10.3)
Chloride: 98 mmol/L (ref 98–111)
Creatinine, Ser: 1.32 mg/dL — ABNORMAL HIGH (ref 0.61–1.24)
GFR calc Af Amer: 53 mL/min — ABNORMAL LOW (ref >60)
GFR calc non Af Amer: 46 mL/min — ABNORMAL LOW (ref >60)
Glucose, Bld: 117 mg/dL — ABNORMAL HIGH (ref 70–99)
Potassium: 4.5 mmol/L (ref 3.5–5.1)
Sodium: 133 mmol/L — ABNORMAL LOW (ref 135–145)

## 2019-08-14 MED ORDER — LORAZEPAM 2 MG/ML IJ SOLN
0.2500 mg | Freq: Once | INTRAMUSCULAR | Status: DC
  Filled 2019-08-14: qty 1

## 2019-08-14 NOTE — Progress Notes (Addendum)
PROGRESS NOTE  Cody Mendoza:[id number] DOB: 01-01-1922 DOA: 08/10/2019 PCP: Crist Infante, MD   LOS: 4 days   Brief narrative: As per HPI,  Cody Mendoza a 86 y.o.malewith medical history significant ofatrial fibrillation, diverticulosis, hypertension, heart failure diastolic dysfunction with preserved ejection fraction EF 55 to 60% as of April 2021, asthma, GERD presented with somewhat of an acute exacerbation of shortness of breath  from [facility] where he lives with his wife. Patient indicated that shortness of breath was worse when supine, improved with sitting upright as well as worse with exertion. Patient reported some swelling in his lower extremities that appears to be dependent, improving with rest.   In ED, patient had chest x-ray consistent with bibasilar opacifications concerning for effusion although pneumonia cannot be ruled out. Admission labs showed anemia with hemoglobin at 7.8 with a baseline around 9.0 per previous chart review. Blood ordered in the ED, was given Lasix 40x1 for presumed heart failure exacerbation with symptomatic anemia.  Patient was then admitted hospital for further evaluation and treatment.  Assessment/Plan:  Active Problems:   GERD   Hypertension   Atrial fibrillation (HCC)   CKD (chronic kidney disease) stage 2, GFR 60-89 ml/min   Chronic anemia   Acute CHF (congestive heart failure) (HCC)   Acute exacerbation of congestive heart failure (HCC)   Hypokalemia   Goals of care, counseling/discussion   DNR (do not resuscitate)   Palliative care by specialist  Acute diastolic heart failure exacerbation Initially he received IV Lasix with ~9 L of total output. Little more alert today.  Today, BUN 36 with creatinine of 1.2.  Will need to closely monitor.  Continue fluid restriction, low-salt diet. TTE performed in April so no need for repeat at this time.  Continue p.o. Lasix 20 mg daily.  New fever.  Possibility of aspiration pneumonitis. Was more sleepy yesterday.  We will put aspiration precautions.  Monitor for fever trend.  Check chest x-ray for baseline.  If spiking fever could consider Augmentin.  Confusion, metabolic encephalopathy yesterday.  Patient received Ativan x1 day before yesterday.  Will continue to avoid further sedation.  Continue on mittens.  Could consider one-to-one sitter if needed.  Could consider Haldol instead of Ativan if absolutely needed   Essential hypertension -On amlodipine, Lasix.  Monitor blood pressure.  Chronic kidney disease stage II. Monitor renal function closely on p.o. diuretic.  Creatinine 1.2 today check BMP in a.m.   Atrial fibrillation - Continue metoprolol and Eliquis for anticoagulation  Hypokalemia/hypomagnesemia Improved after replacement.  Check BMP magnesium in a.m.  Anemia - Hgb stable after transfusion.  Hemoglobin of 9.9 today.  Monitor CBC in a.m.  Ethics.  Palliative care was consulted for goals of care.  Palliative care discussing with the family.  VTE Prophylaxis: Eliquis  Code Status: DNR  Family Communication: Palliative care discussing with the son.  I also spoke with the patient's son and updated him about overnight events including fever possibility of aspiration pneumonia and potential poor prognosis in the future.  Status is: Inpatient  Remains inpatient appropriate because:Inpatient level of care appropriate due to severity of illness,  palliative care on board, fever possible aspiration pneumonitis.  Will get chest x-ray.   Dispo: The patient is from: Independent living facility              Anticipated d/c is to: SNF, possibility of palliative care hospice if continues to decline.  Anticipated d/c date is: 1-2 days if mentation better, but fever trend.             Patient currently is not medically stable to d/c.  Consultants:  Palliative  care  Procedures:  None  Antibiotics:  . None  Anti-infectives (From admission, onward)   None     Subjective:  Patient was seen and examined at bedside.  Nursing staff reported that patient had a fever at few beats of V. tach.  Patient is little more alert awake today but still confused on discharge.  He did require mittens due to intermittent agitation. Objective: Vitals:   08/13/19 2238 08/14/19 0513  BP: 114/80 (!) 155/69  Pulse: 80 80  Resp:  (!) 24  Temp: 98 F (36.7 C) 97.6 F (36.4 C)  SpO2: 98% 95%    Intake/Output Summary (Last 24 hours) at 08/14/2019 0721 Last data filed at 08/14/2019 0513 Gross per 24 hour  Intake 920 ml  Output 880 ml  Net 40 ml   Filed Weights   08/12/19 0500 08/13/19 0523 08/14/19 0513  Weight: 73.7 kg 73.5 kg 74.4 kg   Body mass index is 25.69 kg/m.   Physical Exam: GENERAL: Patient is sleepy but more alert than yesterday,, indistinct speech. Not in obvious distress.  Confused,. HENT: No scleral pallor or icterus. Pupils equally reactive to light. Oral mucosa is moist NECK: is supple, no gross swelling noted. CHEST: Decreased breath sounds bilaterally.  Coarse breath sounds noted CVS: S1 and S2 heard, no murmur.  Regular rhythm ABDOMEN: Soft, non-tender, bowel sounds are present. EXTREMITIES: Trace peripheral edema CNS: Mildly sleepy, moving extremities.   SKIN: warm and dry without rashes.  Data Review: I have personally reviewed the following laboratory data and studies,  CBC: Recent Labs  Lab 08/10/19 0444 08/11/19 0433 08/12/19 0256 08/13/19 0620 08/14/19 0542  WBC 10.6* 11.5* 9.6 8.8 9.6  NEUTROABS 7.5  --   --   --   --   HGB 7.8* 9.5* 9.7* 9.5* 9.9*  HCT 25.2* 29.7* 30.4* 30.5* 30.9*  MCV 94.7 90.5 92.4 93.6 92.5  PLT 263 258 248 237 470   Basic Metabolic Panel: Recent Labs  Lab 08/10/19 0444 08/11/19 0433 08/12/19 0256 08/13/19 0620 08/14/19 0542  NA 136 135 134* 133* 133*  K 3.8 3.4* 4.1 4.4 4.5  CL  104 96* 94* 98 98  CO2 25 29 32 28 26  GLUCOSE 107* 106* 114* 103* 117*  BUN 24* 27* 38* 40* 41*  CREATININE 1.10 1.20 1.51* 1.45* 1.32*  CALCIUM 8.1* 8.4* 8.1* 8.3* 8.7*  MG  --   --  1.6* 2.0  --    Liver Function Tests: No results for input(s): AST, ALT, ALKPHOS, BILITOT, PROT, ALBUMIN in the last 168 hours. No results for input(s): LIPASE, AMYLASE in the last 168 hours. No results for input(s): AMMONIA in the last 168 hours. Cardiac Enzymes: No results for input(s): CKTOTAL, CKMB, CKMBINDEX, TROPONINI in the last 168 hours. BNP (last 3 results) Recent Labs    06/26/19 2203 08/10/19 0444  BNP 165.6* 196.7*    ProBNP (last 3 results) No results for input(s): PROBNP in the last 8760 hours.  CBG: No results for input(s): GLUCAP in the last 168 hours. Recent Results (from the past 240 hour(s))  SARS Coronavirus 2 by RT PCR (hospital order, performed in [facility] hospital lab) Nasopharyngeal Nasopharyngeal Swab     Status: None   Collection Time: 08/10/19  4:44 AM  Specimen: Nasopharyngeal Swab  Result Value Ref Range Status   SARS Coronavirus 2 NEGATIVE NEGATIVE Final    Comment: (NOTE) SARS-CoV-2 target nucleic acids are NOT DETECTED. The SARS-CoV-2 RNA is generally detectable in upper and lower respiratory specimens during the acute phase of infection. The lowest concentration of SARS-CoV-2 viral copies this assay can detect is 250 copies / mL. A negative result does not preclude SARS-CoV-2 infection and should not be used as the sole basis for treatment or other patient management decisions.  A negative result may occur with improper specimen collection / handling, submission of specimen other than nasopharyngeal swab, presence of viral mutation(s) within the areas targeted by this assay, and inadequate number of viral copies (<250 copies / mL). A negative result must be combined with clinical observations, patient history, and epidemiological information. Fact  Sheet for Patients:   StrictlyIdeas.no Fact Sheet for Healthcare Providers: BankingDealers.co.za This test is not yet approved or cleared  by the [place] FDA and has been authorized for detection and/or diagnosis of SARS-CoV-2 by FDA under an Emergency Use Authorization (EUA).  This EUA will remain in effect (meaning this test can be used) for the duration of the COVID-19 declaration under Section 564(b)(1) of the Act, 21 U.S.C. section 360bbb-3(b)(1), unless the authorization is terminated or revoked sooner. Performed at [facility], [place] [address] [place], [place] [address]   MRSA PCR Screening     Status: Abnormal   Collection Time: 08/10/19  6:40 PM   Specimen: Nasopharyngeal  Result Value Ref Range Status   MRSA by PCR POSITIVE (A) NEGATIVE Final    Comment:        The GeneXpert MRSA Assay (FDA approved for NASAL specimens only), is one component of a comprehensive MRSA colonization surveillance program. It is not intended to diagnose MRSA infection nor to guide or monitor treatment for MRSA infections. RESULT CALLED TO, READ BACK BY AND VERIFIED WITH: Judithann Graves @ 2200 08/11/2019 PERRY, J. Performed at [facility], [place] [address], [place], [place] [address]      Studies: No results found.   Flora Lipps, MD  [facility] 08/14/2019

## 2019-08-14 NOTE — Progress Notes (Signed)
Care taken over from previous RN at thist time; agrees with previous RN assessment. Pt remains in bed in NAD. Will continue to assess and monitor pt.

## 2019-08-14 NOTE — Progress Notes (Signed)
Chaplain attempted visit with patient. Patient was semi-awake, fidgety with mittens, but eyes not open nor responsive to voices.  Chaplain suggested nurse contact [place] when son comes back, in order to give family support.  Rev.Tamsen Snider Pager [phone]

## 2019-08-14 NOTE — Progress Notes (Signed)
Daily Progress Note   Patient Name: Cody Mendoza       Date: 08/14/2019 DOB: 1921-04-24  Age: 86 y.o. MRN#: [id number] Attending Physician: Cody Lipps, MD Primary Care Physician: Cody Infante, MD Admit Date: 08/10/2019  Reason for Consultation/Follow-up: Establishing goals of care  Subjective: Patient sleeping - does not wake to verbal stimulation. Per notes, agitated overnight and received small dose of ativan. Did not attempt to wake. Discussed with RN.   Length of Stay: 4  Current Medications: Scheduled Meds:  . amLODipine  5 mg Oral QPM  . apixaban  5 mg Oral BID  . Chlorhexidine Gluconate Cloth  6 each Topical Q0600  . doxazosin  2 mg Oral Daily  . furosemide  20 mg Oral Daily  . latanoprost  1 drop Both Eyes QHS  . LORazepam  0.25 mg Intravenous Once  . metoprolol succinate  25 mg Oral Daily  . mirtazapine  15 mg Oral QHS  . multivitamin with minerals  1 tablet Oral Q supper  . mupirocin ointment  1 application Nasal BID  . pantoprazole  40 mg Oral q morning - 10a  . polyvinyl alcohol  1 drop Both Eyes QHS  . potassium chloride  40 mEq Oral BID  . vitamin B-12  500 mcg Oral q morning - 10a    Continuous Infusions: . sodium chloride Stopped (08/10/19 0643)    PRN Meds: acetaminophen **OR** acetaminophen, levalbuterol, ondansetron **OR** ondansetron (ZOFRAN) IV, senna-docusate  Physical Exam Constitutional:      Comments: Sleeping, does not wake to verbal stimulation  Pulmonary:     Breath sounds: Wheezing present.  Skin:    General: Skin is warm and dry.             Vital Signs: BP (!) 155/69 (BP Location: Right Arm)   Pulse 96   Temp 98.9 F (37.2 C) (Oral)   Resp 18   Ht 5\' 7"  (1.702 m)   Wt 74.4 kg   SpO2 (!) 89%   BMI 25.69 kg/m  SpO2: SpO2: (!) 89  % O2 Device: O2 Device: Room Air O2 Flow Rate: O2 Flow Rate (L/min): 2 L/min  Intake/output summary:   Intake/Output Summary (Last 24 hours) at 08/14/2019 1023 Last data filed at 08/14/2019 2119 Gross per 24 hour  Intake 680 ml  Output 880 ml  Net -200 ml   LBM: Last BM Date: 08/13/19 Baseline Weight: Weight: 77.1 kg Most recent weight: Weight: 74.4 kg       Palliative Assessment/Data: PPS 40%    Flowsheet Rows     Most Recent Value  Intake Tab  Referral Department  Hospitalist  Unit at Time of Referral  Cardiac/Telemetry Unit  Palliative Care Primary Diagnosis  Cardiac  Date Notified  08/13/19  Palliative Care Type  New Palliative care  Reason for referral  Clarify Goals of Care  Date of Admission  08/10/19  Date first seen by Palliative Care  08/13/19  # of days Palliative referral response time  0 Day(s)  # of days IP prior to Palliative referral  3  Clinical Assessment  Palliative Performance Scale Score  40%  Psychosocial & Spiritual Assessment  Palliative Care Outcomes  Patient/Family meeting held?  Yes  Who was at the meeting?  son  Palliative Care Outcomes  Clarified goals of care, Counseled regarding hospice, Provided psychosocial or spiritual support, Linked to palliative care logitudinal support      Patient Active Problem List   Diagnosis Date Noted  . Goals of care, counseling/discussion   . DNR (do not resuscitate)   . Palliative care by specialist   . Hypokalemia 08/11/2019  . Acute exacerbation of congestive heart failure ([place]) 08/10/2019  . Acute CHF (congestive heart failure) ([place]) 06/26/2019  . Lower extremity edema 09/13/2018  . Near syncope 08/28/2018  . Hyponatremia 08/28/2018  . TIA (transient ischemic attack) 08/27/2018  . Discitis of thoracic region 08/23/2018  . CKD (chronic kidney disease) stage 2, GFR 60-89 ml/min 08/23/2018  . Chronic anemia 08/23/2018  . Discitis 08/23/2018  . Puncture wound of right lower leg without foreign body  10/08/2013  . Mitral insufficiency 12/28/2010  . Atrial fibrillation ([place]) 11/16/2010  . Hypertension   . GERD 04/22/2010  . PERSONAL HX COLONIC POLYPS 04/22/2010    Palliative Care Assessment & Plan   HPI: 86 y.o. male  with past medical history of a fib, diverticulosis, HTN, CHF with EF 55-60%, CKD, and GERD admitted on 08/10/2019 with shortness of breath. CXR revealed bibasilar opacifications concerning for effusion vs pneumonia.  Patient being treated for acute heart failure exacerbation. Creatinine was stopped d/t increased creatinine/BUN. He feels short of breath with minimal movement. PMT consulted for [place].  Assessment: Patient unable to participate in conversation.  Called son, Cody Mendoza - provided update about overnight events.   Cody Mendoza continues to be interested in hospice care in the near future nut not sure if he wants to initiate services prior to patient's discharge as he would like for patient's PCP to weigh in - appointment with PCP 6/21. He would also like patient's daughter to to be included in conversation but she is currently out of town. Cody Mendoza has my number and will give to patient's daughter if she has further questions for me.  Recommendations/Plan: Remains interested in hospice but unlikely to initiate services prior to discharge - wants patient to see PCP and receive referral from PCP Suggest palliative outpatient referral at discharge if hospice is not initiated Son would like patient to return to ILF with patient's wife - he feels they are able to meet patient's needs  Code Status: DNR  Discharge Planning: To Be Determined - ILF with hospice vs palliative  Care plan was discussed with patient's son  Thank you for allowing the Palliative Medicine Team to assist in the care of this patient.   Total Time 15 minutes Prolonged Time Billed  no       Greater than 50%  of this time was spent counseling and coordinating care related to the above assessment and  plan.  Juel Burrow, DNP, [facility] Palliative Medicine Team Team Phone # [phone]  Pager [phone]

## 2019-08-14 NOTE — Progress Notes (Signed)
OT Cancellation Note  Patient Details Name: Cody Mendoza MRN: [id number] DOB: [birth year]   Cancelled Treatment:    Reason Eval/Treat Not Completed: Other (comment)(Attempted OT treatment. Patient unable to follow commands, restless and agitated and focused on trying to get left mitten off. Unable to participate in functional activity. Will f/u.)  Shamela Haydon L Warden Buffa 08/14/2019, 3:25 PM

## 2019-08-14 NOTE — Progress Notes (Signed)
Pt awake upright in bed, son at bedside feeding him, competed 75%. Calm and cooperative.

## 2019-08-14 NOTE — Progress Notes (Signed)
PT Cancellation Note  Patient Details Name: Cody Mendoza MRN: [id number] DOB: 21-Jun-1921   Cancelled Treatment:    Reason Eval/Treat Not Completed: Fatigue/lethargy limiting ability to participate(per RN pt is lethargic, not alert and not able to participate in PT. Will follow.)  Philomena Doheny PT 08/14/2019  Acute Rehabilitation Services Pager [phone] Office [phone]

## 2019-08-15 ENCOUNTER — Inpatient Hospital Stay (HOSPITAL_COMMUNITY): Payer: Medicare Other

## 2019-08-15 LAB — BASIC METABOLIC PANEL
Anion gap: 10 (ref 5–15)
BUN: 36 mg/dL — ABNORMAL HIGH (ref 8–23)
CO2: 25 mmol/L (ref 22–32)
Calcium: 8.6 mg/dL — ABNORMAL LOW (ref 8.9–10.3)
Chloride: 99 mmol/L (ref 98–111)
Creatinine, Ser: 1.25 mg/dL — ABNORMAL HIGH (ref 0.61–1.24)
GFR calc Af Amer: 56 mL/min — ABNORMAL LOW (ref >60)
GFR calc non Af Amer: 49 mL/min — ABNORMAL LOW (ref >60)
Glucose, Bld: 108 mg/dL — ABNORMAL HIGH (ref 70–99)
Potassium: 4.6 mmol/L (ref 3.5–5.1)
Sodium: 134 mmol/L — ABNORMAL LOW (ref 135–145)

## 2019-08-15 MED ORDER — POTASSIUM CHLORIDE 20 MEQ PO PACK
40.0000 meq | PACK | Freq: Every day | ORAL | Status: DC
  Administered 2019-08-16 – 2019-08-17 (×2): 40 meq via ORAL
  Filled 2019-08-15 (×2): qty 2

## 2019-08-15 NOTE — Plan of Care (Signed)
  Problem: Clinical Measurements: Goal: Diagnostic test results will improve Outcome: Progressing Goal: Cardiovascular complication will be avoided Outcome: Progressing   Problem: Elimination: Goal: Will not experience complications related to bowel motility Outcome: Progressing   Problem: Pain Managment: Goal: General experience of comfort will improve Outcome: Progressing

## 2019-08-15 NOTE — Progress Notes (Signed)
PROGRESS NOTE  Cody Mendoza JJO:[id number] DOB: Jul 08, 1921 DOA: 08/10/2019 PCP: Crist Infante, MD   LOS: 5 days   Brief narrative: As per HPI,  Cody Mendoza a 86 y.o.malewith medical history significant ofatrial fibrillation, diverticulosis, hypertension, heart failure diastolic dysfunction with preserved ejection fraction EF 55 to 60% as of April 2021, asthma, GERD presented with somewhat of an acute exacerbation of shortness of breath  from [facility] where he lives with his wife. Patient indicated that shortness of breath was worse when supine, improved with sitting upright as well as worse with exertion. Patient reported some swelling in his lower extremities that appears to be dependent, improving with rest. In ED, patient had chest x-ray consistent with bibasilar opacifications concerning for effusion although pneumonia cannot be ruled out. Admission labs showed anemia with hemoglobin at 7.8 with a baseline around 9 per previous chart review. Blood ordered in the ED, was given Lasix 40x1 for presumed heart failure exacerbation with symptomatic anemia.  Patient was then admitted hospital for further evaluation and treatment.  Assessment/Plan:  Active Problems:   GERD   Hypertension   Atrial fibrillation (HCC)   CKD (chronic kidney disease) stage 2, GFR 60-89 ml/min   Chronic anemia   Acute CHF (congestive heart failure) (HCC)   Acute exacerbation of congestive heart failure (HCC)   Hypokalemia   Goals of care, counseling/discussion   DNR (do not resuscitate)   Palliative care by specialist   New fever with coarse breathing possibility of aspiration pneumonitis.  Patient was mildly somnolent yesterday.  We will try to avoid sedation as much as possible.  Put incentive spirometry, nebulizers, follow fever trend.  Check x-ray of the chest.  Could consider Augmentin if needed.  Acute diastolic heart failure exacerbation 2D echocardiogram  with preserved LV function.  Initially he received IV Lasix with ~9 L of total output.  Patient is a poor historian.  Today, BUN 36 with creatinine of 1.2.  Will need to closely monitor.  Continue fluid restriction, low-salt diet. TTE performed in April so no need for repeat at this time.  Continue p.o. Lasix 20 mg daily.  Confusion, metabolic encephalopathy .  Patient received Ativan x1 day before yesterday..  Will avoid further sedation for now.  Will likely need one-to-one sitter. Currently onpossible mittens.  Essential hypertension -On amlodipine, Lasix.  Vitals stable.  Chronic kidney disease stage II. Monitor renal function closely on p.o. diuretic.  Check BMP in a.m.   Atrial fibrillation - Continue metoprolol and Eliquis for anticoagulation  Hypokalemia/hypomagnesemia Improved after replacement.  Check BMP magnesium in a.m.  Anemia - Hgb stable after transfusion.  Hemoglobin of 9.9.  Monitor CBC in a.m.  Ethics.  Palliative care was consulted for goals of care.  Palliative care discussing with the family.  VTE Prophylaxis: Eliquis  Code Status: DNR  Family Communication: Palliative care discussing with the son.  I also spoke with the patient's son on the phone and updated him about the clinical condition of the patient.  Also spoke about potential aspiration and potential poor prognosis and the patient.  Status is: Inpatient  Remains inpatient appropriate because:Inpatient level of care appropriate due to severity of illness,  palliative care on board, new fever with possible aspiration.   Dispo: The patient is from: Independent living facility              Anticipated d/c is to: SNF  Anticipated d/c date is: 1-2 days if mentation better, follow fever trend.              Patient currently is not medically stable to d/c.  Consultants:  Palliative care  Procedures:  None  Antibiotics:  . None  Anti-infectives (From admission, onward)   None       Subjective:  Today, patient was seen and examined at bedside.  Nursing staff reported that the patient had a fever at school .  He did have some episodes of ventricular tachycardia.  Objective: Vitals:   08/15/19 0438 08/15/19 0656  BP: 131/65 (!) 125/55  Pulse: 68 80  Resp: 20 20  Temp: 100 F (37.8 C) (!) 100.8 F (38.2 C)  SpO2: 91% 94%    Intake/Output Summary (Last 24 hours) at 08/15/2019 0758 Last data filed at 08/15/2019 0536 Gross per 24 hour  Intake 300 ml  Output 2000 ml  Net -1700 ml   Filed Weights   08/13/19 0523 08/14/19 0513 08/15/19 0648  Weight: 73.5 kg 74.4 kg 70.5 kg   Body mass index is 24.34 kg/m.   Physical Exam: GENERAL: Patient is more alert today with indistinct speech. Not in obvious distress.  Confused and disoriented. HENT: No scleral pallor or icterus. Pupils equally reactive to light. Oral mucosa is moist NECK: is supple, no gross swelling noted. CHEST: Decreased breath sounds bilaterally with coarse breath sounds CVS: S1 and S2 heard, no murmur.  Regular rhythm ABDOMEN: Soft, non-tender, bowel sounds are present. EXTREMITIES: Trace peripheral edema CNS: More alert awake today, moving extremities.   SKIN: warm and dry without rashes.  Data Review: I have personally reviewed the following laboratory data and studies,  CBC: Recent Labs  Lab 08/10/19 0444 08/11/19 0433 08/12/19 0256 08/13/19 0620 08/14/19 0542  WBC 10.6* 11.5* 9.6 8.8 9.6  NEUTROABS 7.5  --   --   --   --   HGB 7.8* 9.5* 9.7* 9.5* 9.9*  HCT 25.2* 29.7* 30.4* 30.5* 30.9*  MCV 94.7 90.5 92.4 93.6 92.5  PLT 263 258 248 237 034   Basic Metabolic Panel: Recent Labs  Lab 08/11/19 0433 08/12/19 0256 08/13/19 0620 08/14/19 0542 08/15/19 0531  NA 135 134* 133* 133* 134*  K 3.4* 4.1 4.4 4.5 4.6  CL 96* 94* 98 98 99  CO2 29 32 28 26 25   GLUCOSE 106* 114* 103* 117* 108*  BUN 27* 38* 40* 41* 36*  CREATININE 1.20 1.51* 1.45* 1.32* 1.25*  CALCIUM 8.4* 8.1* 8.3*  8.7* 8.6*  MG  --  1.6* 2.0  --   --    Liver Function Tests: No results for input(s): AST, ALT, ALKPHOS, BILITOT, PROT, ALBUMIN in the last 168 hours. No results for input(s): LIPASE, AMYLASE in the last 168 hours. No results for input(s): AMMONIA in the last 168 hours. Cardiac Enzymes: No results for input(s): CKTOTAL, CKMB, CKMBINDEX, TROPONINI in the last 168 hours. BNP (last 3 results) Recent Labs    06/26/19 2203 08/10/19 0444  BNP 165.6* 196.7*    ProBNP (last 3 results) No results for input(s): PROBNP in the last 8760 hours.  CBG: No results for input(s): GLUCAP in the last 168 hours. Recent Results (from the past 240 hour(s))  SARS Coronavirus 2 by RT PCR (hospital order, performed in [facility] hospital lab) Nasopharyngeal Nasopharyngeal Swab     Status: None   Collection Time: 08/10/19  4:44 AM   Specimen: Nasopharyngeal Swab  Result Value Ref Range Status  SARS Coronavirus 2 NEGATIVE NEGATIVE Final    Comment: (NOTE) SARS-CoV-2 target nucleic acids are NOT DETECTED. The SARS-CoV-2 RNA is generally detectable in upper and lower respiratory specimens during the acute phase of infection. The lowest concentration of SARS-CoV-2 viral copies this assay can detect is 250 copies / mL. A negative result does not preclude SARS-CoV-2 infection and should not be used as the sole basis for treatment or other patient management decisions.  A negative result may occur with improper specimen collection / handling, submission of specimen other than nasopharyngeal swab, presence of viral mutation(s) within the areas targeted by this assay, and inadequate number of viral copies (<250 copies / mL). A negative result must be combined with clinical observations, patient history, and epidemiological information. Fact Sheet for Patients:   StrictlyIdeas.no Fact Sheet for Healthcare Providers: BankingDealers.co.za This test is not yet  approved or cleared  by the [place] FDA and has been authorized for detection and/or diagnosis of SARS-CoV-2 by FDA under an Emergency Use Authorization (EUA).  This EUA will remain in effect (meaning this test can be used) for the duration of the COVID-19 declaration under Section 564(b)(1) of the Act, 21 U.S.C. section 360bbb-3(b)(1), unless the authorization is terminated or revoked sooner. Performed at [facility], [place] [address] [place], [place] [address]   MRSA PCR Screening     Status: Abnormal   Collection Time: 08/10/19  6:40 PM   Specimen: Nasopharyngeal  Result Value Ref Range Status   MRSA by PCR POSITIVE (A) NEGATIVE Final    Comment:        The GeneXpert MRSA Assay (FDA approved for NASAL specimens only), is one component of a comprehensive MRSA colonization surveillance program. It is not intended to diagnose MRSA infection nor to guide or monitor treatment for MRSA infections. RESULT CALLED TO, READ BACK BY AND VERIFIED WITH: Judithann Graves @ 2200 08/11/2019 PERRY, J. Performed at [facility], [place] [address], [place], [place] [address]      Studies: No results found.   Flora Lipps, MD  [facility] 08/15/2019

## 2019-08-15 NOTE — Progress Notes (Signed)
Physical Therapy Treatment Patient Details Name: Cody Mendoza MRN: [id number] DOB: December 31, 1921 Today's Date: 08/15/2019  Clinical Impression Patient more alert today and following commands for sequencing bed mobility. Pt required mod assist to rise from Rt sidelying to sit EOB. Patient able to complete LE/UE exercises at EOB and counted out loud for repetitions of 10. Pt attempted stand from elevated EOB with [facility] but unable to initiate rise and would require Total Assist for tranfers. HR remained 70's-90's during session. Due to patients fatigue after exercises pt returned to supine and then moved to chair position to eat lunch. Pt required set up assist for drink and to open salad container. Acute PT will continue to follow and progress patient as able.    08/15/19 1300  PT Visit Information  Last PT Received On 08/15/19  Assistance Needed +2  History of Present Illness 86 yo male admitted with CHF. Hx of A fib, TIA, HF, inability to ambulate.  Subjective Data  Patient Stated Goal to go back home at dc  Precautions  Precautions Fall  Restrictions  Weight Bearing Restrictions No  Pain Assessment  Pain Assessment Faces  Faces Pain Scale 4  Pain Location B Knees  Pain Descriptors / Indicators Grimacing  Pain Intervention(s) Limited activity within patient's tolerance;Monitored during session;Repositioned  Cognition  Arousal/Alertness Awake/alert  Behavior During Therapy [facility] for tasks assessed/performed  Overall Cognitive Status Within Functional Limits for tasks assessed  General Comments Patient HOH. more alert this date and able to state month/year without cues. pt answered questions regarding PLOF and reports family using lift to help him to chiar for ~ 18months. unclear on if lift equipment is Stedy or mechanical lift with sling.   Difficult to assess due to Hard of hearing/deaf  Bed Mobility  Overal bed mobility Needs Assistance  Bed Mobility Rolling;Sit to  Sidelying;Sidelying to Sit  Rolling Mod assist  Sidelying to sit Mod assist  Sit to sidelying Mod assist;+2 for safety/equipment  General bed mobility comments pt able to follow commands for bed mobility today. pt required mod assist to sequence LE mobility to EOB, limited by bil knee pain with LE movement. patient required mod assist to reach Lt UE to rail and raise trunk upright. Once sitting EOB pt was able to maintain balance with bil UE support. pt able to follow verbal/tactile cues to sequence return to Rt sidelying using UEs to control lowering and Mod asisst to rasie LE's into bed. Mod assist required to roll in bed for bedpad change.   Transfers  Overall transfer level Needs assistance  General transfer comment attempted [facility] rise for power up at EOB. pt unable to initiate and would require Total Assist +2 at this time.   Balance  Overall balance assessment Needs assistance  Sitting-balance support No upper extremity supported;Feet supported;Bilateral upper extremity supported  Sitting balance-Leahy Scale Good  Sitting balance - Comments pt required bil UE support initially to sit EOB and progressed to maintain balacne without UE support. Exercises for UE and LE performed EOB with min guard.  Exercises  Exercises Other exercises;General Lower Extremity  General Exercises - Lower Extremity  Long Arc Quad AROM;Both;10 reps;Seated  Hip Flexion/Marching AROM;Both;10 reps;Seated  Other Exercises  Other Exercises 10x bil UE shoulder abduction "angel wings" to bring hand overhead.  Other Exercises 10x bil UE scaular retraction (pulling therapist hands for resistance) and serratus punch forward (pushing therapists hand for resistance)  PT - End of Session  Equipment Utilized During Treatment Gait belt  Activity Tolerance Patient tolerated treatment well  Patient left in bed;with call bell/phone within reach;with bed alarm set  Nurse Communication Mobility status   PT - Assessment/Plan  PT  Plan Current plan remains appropriate  PT Visit Diagnosis Muscle weakness (generalized) (M62.81);Other abnormalities of gait and mobility (R26.89)  PT Frequency (ACUTE ONLY) Min 3X/week  Follow Up Recommendations SNF (HHPT, 24/7 care if family declines placement)  PT equipment None recommended by PT  AM-PAC PT "6 Clicks" Mobility Outcome Measure (Version 2)  Help needed turning from your back to your side while in a flat bed without using bedrails? 2  Help needed moving from lying on your back to sitting on the side of a flat bed without using bedrails? 2  Help needed moving to and from a bed to a chair (including a wheelchair)? 1  Help needed standing up from a chair using your arms (e.g., wheelchair or bedside chair)? 1  Help needed to walk in hospital room? 1  Help needed climbing 3-5 steps with a railing?  1  6 Click Score 8  Consider Recommendation of Discharge To: CIR/SNF/LTACH  PT Goal Progression  Progress towards PT goals Progressing toward goals  Acute Rehab PT Goals  PT Goal Formulation Patient unable to participate in goal setting  Time For Goal Achievement 08/25/19  Potential to Achieve Goals Fair  PT Time Calculation  PT Start Time (ACUTE ONLY) 1222  PT Stop Time (ACUTE ONLY) 1251  PT Time Calculation (min) (ACUTE ONLY) 29 min  PT General Charges  $$ ACUTE PT VISIT 1 Visit  PT Treatments  $Therapeutic Exercise 8-22 mins  $Therapeutic Activity 8-22 mins     Verner Mould, DPT [place]  Office [phone] Pager [phone]  08/15/2019 5:37 PM

## 2019-08-15 NOTE — Progress Notes (Signed)
Patient with 7 beat run of vtach. MD notified and oncoming nurse made aware.

## 2019-08-15 NOTE — Progress Notes (Signed)
Patient is A&O, self. Pt is drowsy but able to arouse. Pt is not following commands. PO's attempted, unsuccessful. Provider at bedside. Will hold PO medications for now.

## 2019-08-16 DIAGNOSIS — R531 Weakness: Secondary | ICD-10-CM

## 2019-08-16 LAB — CBC
HCT: 29.1 % — ABNORMAL LOW (ref 39.0–52.0)
Hemoglobin: 9.2 g/dL — ABNORMAL LOW (ref 13.0–17.0)
MCH: 29.3 pg (ref 26.0–34.0)
MCHC: 31.6 g/dL (ref 30.0–36.0)
MCV: 92.7 fL (ref 80.0–100.0)
Platelets: 253 10*3/uL (ref 150–400)
RBC: 3.14 MIL/uL — ABNORMAL LOW (ref 4.22–5.81)
RDW: 16.2 % — ABNORMAL HIGH (ref 11.5–15.5)
WBC: 7 10*3/uL (ref 4.0–10.5)
nRBC: 0 % (ref 0.0–0.2)

## 2019-08-16 LAB — COMPREHENSIVE METABOLIC PANEL
ALT: 13 U/L (ref 0–44)
AST: 17 U/L (ref 15–41)
Albumin: 2.6 g/dL — ABNORMAL LOW (ref 3.5–5.0)
Alkaline Phosphatase: 97 U/L (ref 38–126)
Anion gap: 12 (ref 5–15)
BUN: 38 mg/dL — ABNORMAL HIGH (ref 8–23)
CO2: 24 mmol/L (ref 22–32)
Calcium: 8.2 mg/dL — ABNORMAL LOW (ref 8.9–10.3)
Chloride: 94 mmol/L — ABNORMAL LOW (ref 98–111)
Creatinine, Ser: 1.35 mg/dL — ABNORMAL HIGH (ref 0.61–1.24)
GFR calc Af Amer: 51 mL/min — ABNORMAL LOW (ref >60)
GFR calc non Af Amer: 44 mL/min — ABNORMAL LOW (ref >60)
Glucose, Bld: 109 mg/dL — ABNORMAL HIGH (ref 70–99)
Potassium: 4.2 mmol/L (ref 3.5–5.1)
Sodium: 130 mmol/L — ABNORMAL LOW (ref 135–145)
Total Bilirubin: 1 mg/dL (ref 0.3–1.2)
Total Protein: 6.3 g/dL — ABNORMAL LOW (ref 6.5–8.1)

## 2019-08-16 LAB — MAGNESIUM: Magnesium: 1.9 mg/dL (ref 1.7–2.4)

## 2019-08-16 LAB — PHOSPHORUS: Phosphorus: 3.5 mg/dL (ref 2.5–4.6)

## 2019-08-16 NOTE — Progress Notes (Signed)
Daily Progress Note   Patient Name: Cody Mendoza       Date: 08/16/2019 DOB: [birth year]  Age: 86 y.o. MRN#: [id number] Attending Physician: Antonieta Pert, MD Primary Care Physician: Crist Infante, MD Admit Date: 08/10/2019  Reason for Consultation/Follow-up: Establishing goals of care  Subjective: Cody Mendoza is awake alert sitting up in bed, he is hard of hearing but responds appropriately to questions asked. He denies pain, denies shortness of breath, complains of not being able to bear weight/participate much with PT due to weakness in his LLE. he is in no distress. Call placed and discussed with son Cody Mendoza as well. See below.    Length of Stay: 6  Current Medications: Scheduled Meds:  . amLODipine  5 mg Oral QPM  . apixaban  5 mg Oral BID  . doxazosin  2 mg Oral Daily  . furosemide  20 mg Oral Daily  . latanoprost  1 drop Both Eyes QHS  . metoprolol succinate  25 mg Oral Daily  . mirtazapine  15 mg Oral QHS  . multivitamin with minerals  1 tablet Oral Q supper  . pantoprazole  40 mg Oral q morning - 10a  . polyvinyl alcohol  1 drop Both Eyes QHS  . potassium chloride  40 mEq Oral Daily  . vitamin B-12  500 mcg Oral q morning - 10a    Continuous Infusions:   PRN Meds: acetaminophen **OR** acetaminophen, levalbuterol, ondansetron **OR** ondansetron (ZOFRAN) IV, senna-docusate  Awake, a little hard of hearing Denies pain Has wheezes on auscultation anterior lung fields Abdomen is soft, not tender Wearing intermittent pneumatic compression device both LE S1 S2 Awake reasonably alert, answers most questions appropriately.         Vital Signs: BP 128/65 (BP Location: Right Arm)   Pulse 73   Temp 98.8 F (37.1 C) (Oral)   Resp 20   Ht 5\' 7"  (1.702 m)   Wt 76.7 kg   SpO2 95%   BMI  26.48 kg/m  SpO2: SpO2: 95 % O2 Device: O2 Device: Room Air O2 Flow Rate: O2 Flow Rate (L/min): 2 L/min  Intake/output summary:   Intake/Output Summary (Last 24 hours) at 08/16/2019 1027 Last data filed at 08/16/2019 0900 Gross per 24 hour  Intake 660 ml  Output 550 ml  Net 110 ml   LBM: Last BM Date: 08/13/19 Baseline Weight: Weight: 77.1 kg Most recent weight: Weight: 76.7 kg       Palliative Assessment/Data: PPS 40%    Flowsheet Rows     Most Recent Value  Intake Tab  Referral Department Hospitalist  Unit at Time of Referral Cardiac/Telemetry Unit  Palliative Care Primary Diagnosis Cardiac  Date Notified 08/13/19  Palliative Care Type New Palliative care  Reason for referral Clarify Goals of Care  Date of Admission 08/10/19  Date first seen by Palliative Care 08/13/19  # of days Palliative referral response time 0 Day(s)  # of days IP prior to Palliative referral 3  Clinical Assessment  Palliative Performance Scale Score 40%  Psychosocial & Spiritual Assessment  Palliative Care Outcomes  Patient/Family meeting held? Yes  Who was at the meeting? son  Palliative Care Outcomes Clarified goals of care,  Counseled regarding hospice, Provided psychosocial or spiritual support, Linked to palliative care logitudinal support      Patient Active Problem List   Diagnosis Date Noted  . Goals of care, counseling/discussion   . DNR (do not resuscitate)   . Palliative care by specialist   . Hypokalemia 08/11/2019  . Acute exacerbation of congestive heart failure ([place]) 08/10/2019  . Acute CHF (congestive heart failure) ([place]) 06/26/2019  . Lower extremity edema 09/13/2018  . Near syncope 08/28/2018  . Hyponatremia 08/28/2018  . TIA (transient ischemic attack) 08/27/2018  . Discitis of thoracic region 08/23/2018  . CKD (chronic kidney disease) stage 2, GFR 60-89 ml/min 08/23/2018  . Chronic anemia 08/23/2018  . Discitis 08/23/2018  . Puncture wound of right lower leg  without foreign body 10/08/2013  . Mitral insufficiency 12/28/2010  . Atrial fibrillation ([place]) 11/16/2010  . Hypertension   . GERD 04/22/2010  . PERSONAL HX COLONIC POLYPS 04/22/2010    Palliative Care Assessment & Plan   HPI: 86 y.o. male  with past medical history of a fib, diverticulosis, HTN, CHF with EF 55-60%, CKD, and GERD admitted on 08/10/2019 with shortness of breath. CXR revealed bibasilar opacifications concerning for effusion vs pneumonia.  Patient being treated for acute heart failure exacerbation. Creatinine was stopped d/t increased creatinine/BUN. He feels short of breath with minimal movement. PMT consulted for [place].  Assessment: Fever a few days ago, under work up for possible aspiration pneumonitis, resp therapy measures such as incentive spirometry, nebulizers are in place, CXR checked.     On PO lasix for acute diastolic CHF exacerbation.  Has A fib, has II CKD and HTN.   Generalized weakness, functional decline, high risk for aspiration events, high risk for CHF exacerbation or other cardiac events.     Recommendations/Plan: Discussed with son Cody Mendoza on the phone about the patient's current condition, about difference between hospice and palliative care.  Patient to go back to [facility] on discharge, he has been there for the past several months, wife is also there.  Patient has a PCP appointment upcoming soon, he will discuss with his PCP further regarding recommendations for addition of hospice or palliative services at his current facility.  No additional PMT specific recommendations at this time, continue current mode of care.    Code Status: DNR  Discharge Planning: To Be Determined - ILF   Care plan was discussed with patient's son Cody Mendoza.   Thank you for allowing the Palliative Medicine Team to assist in the care of this patient.   Total Time 35 minutes Prolonged Time Billed  no       Greater than 50%  of this time was spent counseling and  coordinating care related to the above assessment and plan.  Loistine Chance MD Palliative Medicine Team Team Phone # [phone]

## 2019-08-16 NOTE — Care Management Important Message (Signed)
Important Message  Patient Details IM Letter given to [name] RN Case Manager to present to the Patient Name: Cody Mendoza MRN: [id number] Date of Birth: 08-Aug-1921   Medicare Important Message Given:  Yes     [name] 08/16/2019, 12:27 PM

## 2019-08-16 NOTE — Progress Notes (Signed)
Occupational Therapy Treatment Patient Details Name: Cody Mendoza MRN: [id number] DOB: May 05, 1921 Today's Date: 08/16/2019    History of present illness 86 yo male admitted with CHF. Hx of A fib, TIA, HF, inability to ambulate.   OT comments  Treatment focused on functional mobility needed for self care tasks and performing ADL from chair. Patient mod assist to transfer to side of bed. Mod assist and use of stedy to transfer patient to recliner. Patient able to perform grooming task seated in recliner. Patient     Follow Up Recommendations  Home health OT    Equipment Recommendations  None recommended by OT    Recommendations for Other Services      Precautions / Restrictions Precautions Precautions: Fall Restrictions Weight Bearing Restrictions: No       Mobility Bed Mobility Overal bed mobility: Needs Assistance       Supine to sit: Mod assist     General bed mobility comments: Assistance for LLE and hand hold to transfer to side of bed. Needed to pull pad to scoot forward to edge  Transfers Overall transfer level: Needs assistance               General transfer comment: Patient reports stedy is similar to what he uses at facility except their's has a higher bar. Patient mod assist to get into full extension to place stedy seats. Patient assisting with pulling up on bar and initiating stand. Transfered to reclilner. Min assist for stand in order to remove seat. Patient tolerated well.    Balance                                           ADL either performed or assessed with clinical judgement   ADL       Grooming: Set up;Oral care;Wash/dry face;Wash/dry hands;Sitting Grooming Details (indicate cue type and reason): seated in recliner.                                     Vision       Perception     Praxis      Cognition Arousal/Alertness: Awake/alert Behavior During Therapy: WFL for tasks  assessed/performed Overall Cognitive Status: Within Functional Limits for tasks assessed                                 General Comments: Improved cognition today        Exercises     Shoulder Instructions       General Comments      Pertinent Vitals/ Pain       Pain Assessment: Faces Faces Pain Scale: Hurts a little bit Pain Location: B Knees Pain Descriptors / Indicators: Grimacing  Home Living                                          Prior Functioning/Environment              Frequency  Min 2X/week        Progress Toward Goals  OT Goals(current goals can now be found in the care plan section)  Progress towards OT goals:  Progressing toward goals  Acute Rehab OT Goals Patient Stated Goal: to go back home at dc OT Goal Formulation: With patient Time For Goal Achievement: 08/25/19  Plan Discharge plan remains appropriate    Co-evaluation                 AM-PAC OT "6 Clicks" Daily Activity     Outcome Measure   Help from another person eating meals?: None Help from another person taking care of personal grooming?: A Little Help from another person toileting, which includes using toliet, bedpan, or urinal?: Total Help from another person bathing (including washing, rinsing, drying)?: A Lot Help from another person to put on and taking off regular upper body clothing?: A Lot Help from another person to put on and taking off regular lower body clothing?: Total 6 Click Score: 13    End of Session Equipment Utilized During Treatment: Gait belt;Other (comment) (stedy)  OT Visit Diagnosis: Muscle weakness (generalized) (M62.81);Pain Pain - part of body: Knee   Activity Tolerance Patient tolerated treatment well   Patient Left in chair;with call bell/phone within reach;with chair alarm set   Nurse Communication Mobility status        Time: 1047-1105 OT Time Calculation (min): 18 min  Charges: OT General  Charges $OT Visit: 1 Visit OT Treatments $Self Care/Home Management : 8-22 mins  Derl Barrow, OTR/L [place]  Office [phone] Pager: [place] 08/16/2019, 12:56 PM

## 2019-08-16 NOTE — TOC Initial Note (Signed)
Transition of Care [facility]) - Initial/Assessment Note    Patient Details  Name: Cody Mendoza MRN: [id number] Date of Birth: Sep 10, 1921  Transition of Care [facility]) CM/SW Contact:    [name], RN Phone Number: 08/16/2019, 12:15 PM  Clinical Narrative:Spoke to patient's son [name]-d/c plan return back to [place] where patient's spouse lives. Patient is w/c bound. PT/OT will be provided by [facility] @ facility will fax HHPT/OT orders-[facility] will be able to receive patient over weekend if d/c. No new covid needed-last covid 6/5 neg. PTAR for transport @ d/c.                   Expected Discharge Plan: Home/Self Care ([facility]) Barriers to Discharge: Continued Medical Work up   Patient Goals and CMS Choice Patient states their goals for this hospitalization and ongoing recovery are:: return back to [place] living CMS Medicare.gov Compare Post Acute Care list provided to:: Patient Represenative (must comment) ([name]-son) Choice offered to / list presented to : Adult Children  Expected Discharge Plan and Services Expected Discharge Plan: Home/Self Care ([facility])   Discharge Planning Services: CM Consult   Living arrangements for the past 2 months: [place]: PT, OT [facility] Agency: Other - See comment ([facility]-contract w/[facility]) Date HH Agency Contacted: 08/16/19 Time HH Agency Contacted: [phone]    Prior Living Arrangements/Services Living arrangements for the past 2 months: [place] Lives with:: Spouse Patient language and need for interpreter reviewed:: Yes Do you feel safe going back to the place where you live?: Yes      Need for Family Participation in Patient Care: No (Comment) Care giver support system in place?: Yes (comment) Current home services: DME (w/c bound) Criminal Activity/Legal Involvement Pertinent to Current Situation/Hospitalization:  No - Comment as needed  Activities of Daily Living Home Assistive Devices/Equipment: Wheelchair, Hearing aid ADL Screening (condition at time of admission) Patient's cognitive ability adequate to safely complete daily activities?: Yes Is the patient deaf or have difficulty hearing?: Yes Does the patient have difficulty seeing, even when wearing glasses/contacts?: No Does the patient have difficulty concentrating, remembering, or making decisions?: No Patient able to express need for assistance with ADLs?: Yes Does the patient have difficulty dressing or bathing?: Yes Independently performs ADLs?: No Communication: Independent Dressing (OT): Needs assistance Is this a change from baseline?: Pre-admission baseline Grooming: Needs assistance Is this a change from baseline?: Pre-admission baseline Feeding: Independent Bathing: Needs assistance Is this a change from baseline?: Pre-admission baseline Toileting: Needs assistance Is this a change from baseline?: Pre-admission baseline In/Out Bed: Needs assistance Is this a change from baseline?: Pre-admission baseline Walks in Home: Needs assistance (W/C bound per patient/son) Is this a change from baseline?: Pre-admission baseline Does the patient have difficulty walking or climbing stairs?: Yes Weakness of Legs: Both Weakness of Arms/Hands: Both  Permission Sought/Granted Permission sought to share information with : Case Manager Permission granted to share information with : Yes, Verbal Permission Granted  Share Information with NAME: Case Manager  Permission granted to share info w AGENCY: [place] granted to share info w Relationship: [name] son [phone]     Emotional Assessment Appearance:: Appears stated age Attitude/Demeanor/Rapport: Gracious Affect (typically observed): Accepting Orientation: : Oriented to Self Alcohol / Substance Use: Not Applicable Psych Involvement: No  (comment)  Admission diagnosis:  Acute pulmonary edema (HCC) [J81.0] Acute CHF (congestive heart failure) (HCC) [I50.9] Acute exacerbation of congestive heart failure (HCC) [S50.5] Acute diastolic congestive heart failure (HCC) [I50.31] Symptomatic anemia [D64.9] Patient Active Problem List   Diagnosis Date Noted  . Goals of care, counseling/discussion   . DNR (do not resuscitate)   . Palliative care by specialist   . Hypokalemia 08/11/2019  . Acute exacerbation of congestive heart failure ([place]) 08/10/2019  . Acute CHF (congestive heart failure) ([place]) 06/26/2019  . Lower extremity edema 09/13/2018  . Near syncope 08/28/2018  . Hyponatremia 08/28/2018  . TIA (transient ischemic attack) 08/27/2018  . Discitis of thoracic region 08/23/2018  . CKD (chronic kidney disease) stage 2, GFR 60-89 ml/min 08/23/2018  . Chronic anemia 08/23/2018  . Discitis 08/23/2018  . Puncture wound of right lower leg without foreign body 10/08/2013  . Mitral insufficiency 12/28/2010  . Atrial fibrillation ([place]) 11/16/2010  . Hypertension   . GERD 04/22/2010  . PERSONAL HX COLONIC POLYPS 04/22/2010   PCP:  Crist Infante, MD Pharmacy:   [facility] [address] [place] - [place] AT [place] [address] [place] [address] Phone: [phone] Fax: [phone]  [facility] [place], [place] [address] [place] [address] Phone: [phone] Fax: [phone]     Social Determinants of Health (SDOH) Interventions    Readmission Risk Interventions Readmission Risk Prevention Plan 08/12/2019  Transportation Screening Complete  Medication Review ([place]) Complete  PCP or Specialist appointment within 3-5 days of discharge Complete  HRI or [place] Complete  SW Recovery Care/Counseling Consult Complete  [place]  Not Applicable  Some recent data might be hidden

## 2019-08-16 NOTE — Plan of Care (Signed)
  Problem: Education: Goal: Knowledge of General Education information will improve Description: Including pain rating scale, medication(s)/side effects and non-pharmacologic comfort measures Outcome: Progressing   Problem: Health Behavior/Discharge Planning: Goal: Ability to manage health-related needs will improve Outcome: Progressing   Problem: Clinical Measurements: Goal: Ability to maintain clinical measurements within normal limits will improve Outcome: Progressing Goal: Will remain free from infection Outcome: Progressing Goal: Diagnostic test results will improve Outcome: Progressing Goal: Respiratory complications will improve Outcome: Progressing Goal: Cardiovascular complication will be avoided Outcome: Progressing   Problem: Activity: Goal: Risk for activity intolerance will decrease Outcome: Progressing   Problem: Nutrition: Goal: Adequate nutrition will be maintained Outcome: Completed/Met Note: Patient eating close to 100% of meals    Problem: Coping: Goal: Level of anxiety will decrease Outcome: Completed/Met   Problem: Elimination: Goal: Will not experience complications related to urinary retention Outcome: Completed/Met

## 2019-08-16 NOTE — Progress Notes (Signed)
PROGRESS NOTE    Cody Mendoza  STM:[id number] DOB: 02-24-1922 DOA: 08/10/2019 PCP: Crist Infante, MD   Chef Complaints: Shortness of breath   Brief Narrative: As prior attending:86 y.o.malewith medical history significant ofatrial fibrillation, diverticulosis, hypertension, heart failure diastolic dysfunction with preserved ejection fraction EF 55 to 60% as of April 2021, asthma, GERD presented with somewhat of an acute exacerbation of shortness of breath  from [facility] independent living facility where he lives with his wife. Patient indicated that shortness of breath was worse when supine, improved with sitting upright as well as worse with exertion. Patient reported some swelling in his lower extremities that appears to be dependent, improving with rest. In ED, patient had chest x-ray consistent with bibasilar opacifications concerning for effusion although pneumonia cannot be ruled out. Admission labs showed anemia with hemoglobin at 7.8 with a baseline around 9 per previous chart review. Blood ordered in the ED, was given Lasix 40x1 for presumed heart failure exacerbation with symptomatic anemia. Patient was then admitted hospital for further evaluation and treatment.  Subjective: Seen this morning patient is alert awake hard of hearing some difficulty with communication Overnight T-max 99.1, stable in 128. 6/10 Performed PT but unable to initiate rise and would require total assist for transfer. Lab with sodium 130 from 134, creatinine AT 1.3 from 1.2 Hb 9.2 g from 9.1, stable WBC 7.0 from 9.6k  Assessment & Plan:  Episode of fever 100.6 100.8- on 7 am 6/10: Temperature curve is stable since then.  Patient was somnolent yesterday and had coarse breathing concern for aspiration pneumonitis-CXR obtained  6/10 AM that showed improved aeration on the right, left lower lobe atelectasis or infiltrate/pneumonia with a small left pleural effusion-but has been afebrile since then,WBC  downtrending 7.0.  Holding off on antibiotics monitor next 24 hours  Acute diastolic CHF exacerbation: 2D echo shows preserved LV, patient received IV Lasix with significant output, net 9.2 L negative balance.  Monitor intake output.  Daily weight.  Continue p.o. Lasix 20 mg daily yes function overall stable BUN 38 creatinine 1.3.  Acute metabolic encephalopathy with confusion/lethargy-status post IV Ativan 2 days ago and resulting with lethargy.  I will hold off Ativan sedation continue fall precaution, supportive care.  CKD stage II: BUN/creatinine overall stable.  Monitor.  Check BMP in a.m. while on diuretics.  Hypertension: Controlled on amlodipine, Cardura, metoprolol and Lasix.  Atrial fibrillation, chronic/persistent: Rate controlled on metoprolol, Eliquis for anticoagulation  Hyponatremia sodium at 130.  Monitor while on diuretics.  Chronic anemia: Hemoglobin stable at transfusion Recent Labs  Lab 08/11/19 0433 08/12/19 0256 08/13/19 0620 08/14/19 0542 08/16/19 0522  HGB 9.5* 9.7* 9.5* 9.9* 9.2*  HCT 29.7* 30.4* 30.5* 30.9* 29.1*   Goals of care followed by palliative care, DNR.  Appreciate palliative care input plan for return to ILF  DVT prophylaxis: Eliquis Code Status: DNR  Family Communication: plan of care discussed with patient at bedside.  Discussed with the nursing staff.  Status is: Inpatient Remains inpatient appropriate because:Inpatient level of care appropriate due to severity of illness and Ongoing monitoring of mental status and temperature curve, renal function.  Dispo: The patient is from: ILF              Anticipated d/c is to: ILF              Anticipated d/c date is: 1 DAY, plan to return to ALF if remains afebrile, last Covid negative on 6/5 and no need for repeat  Covid negative as per facility today.                Patient currently is not medically stable to d/c.  Diet Order            Diet renal with fluid restriction Fluid restriction: 1200  mL Fluid; Room service appropriate? Yes; Fluid consistency: Thin  Diet effective now                  Body mass index is 26.48 kg/m.  Consultants:see note  Procedures:see note Microbiology:see note  Medications: Scheduled Meds: . amLODipine  5 mg Oral QPM  . apixaban  5 mg Oral BID  . doxazosin  2 mg Oral Daily  . furosemide  20 mg Oral Daily  . latanoprost  1 drop Both Eyes QHS  . metoprolol succinate  25 mg Oral Daily  . mirtazapine  15 mg Oral QHS  . multivitamin with minerals  1 tablet Oral Q supper  . pantoprazole  40 mg Oral q morning - 10a  . polyvinyl alcohol  1 drop Both Eyes QHS  . potassium chloride  40 mEq Oral Daily  . vitamin B-12  500 mcg Oral q morning - 10a   Continuous Infusions:  Antimicrobials: Anti-infectives (From admission, onward)   None     Objective: Vitals: Today's Vitals   08/16/19 0412 08/16/19 0435 08/16/19 0830 08/16/19 1348  BP:  128/65  139/81  Pulse:  73  82  Resp:  20  18  Temp:  98.8 F (37.1 C)  97.9 F (36.6 C)  TempSrc:  Oral  Oral  SpO2:  95%  91%  Weight: 76.7 kg     Height:      PainSc:   0-No pain     Intake/Output Summary (Last 24 hours) at 08/16/2019 1434 Last data filed at 08/16/2019 0900 Gross per 24 hour  Intake 660 ml  Output 550 ml  Net 110 ml   Filed Weights   08/14/19 0513 08/15/19 0648 08/16/19 0412  Weight: 74.4 kg 70.5 kg 76.7 kg   Weight change: 6.2 kg   Intake/Output from previous day: 06/10 0701 - 06/11 0700 In: 300 [P.O.:300] Out: 750 [Urine:750] Intake/Output this shift: Total I/O In: 360 [P.O.:360] Out: -   Examination:  General exam: Alert awake oriented to self, hard of hearing, follows some commands.   HEENT:Oral mucosa moist, Ear/Nose WNL grossly,dentition normal. Respiratory system: bilaterally clear,no wheezing or crackles,no use of accessory muscle, non tender. Cardiovascular system: S1 & S2 +, regular, No JVD. Gastrointestinal system: Abdomen soft, NT,ND, BS+. Nervous  System:Alert, awake, moving extremities and grossly nonfocal Extremities: No edema, distal peripheral pulses palpable.  Skin: No rashes,no icterus. MSK: Normal muscle bulk,tone, power  Data Reviewed: I have personally reviewed following labs and imaging studies CBC: Recent Labs  Lab 08/10/19 0444 08/10/19 0444 08/11/19 0433 08/12/19 0256 08/13/19 0620 08/14/19 0542 08/16/19 0522  WBC 10.6*   < > 11.5* 9.6 8.8 9.6 7.0  NEUTROABS 7.5  --   --   --   --   --   --   HGB 7.8*   < > 9.5* 9.7* 9.5* 9.9* 9.2*  HCT 25.2*   < > 29.7* 30.4* 30.5* 30.9* 29.1*  MCV 94.7   < > 90.5 92.4 93.6 92.5 92.7  PLT 263   < > 258 248 237 273 253   < > = values in this interval not displayed.   Basic Metabolic Panel: Recent Labs  Lab 08/12/19 0256 08/13/19 0620 08/14/19 0542 08/15/19 0531 08/16/19 0522  NA 134* 133* 133* 134* 130*  K 4.1 4.4 4.5 4.6 4.2  CL 94* 98 98 99 94*  CO2 32 28 26 25 24   GLUCOSE 114* 103* 117* 108* 109*  BUN 38* 40* 41* 36* 38*  CREATININE 1.51* 1.45* 1.32* 1.25* 1.35*  CALCIUM 8.1* 8.3* 8.7* 8.6* 8.2*  MG 1.6* 2.0  --   --  1.9  PHOS  --   --   --   --  3.5   GFR: Estimated Creatinine Clearance: 30.6 mL/min (A) (by C-G formula based on SCr of 1.35 mg/dL (H)). Liver Function Tests: Recent Labs  Lab 08/16/19 0522  AST 17  ALT 13  ALKPHOS 97  BILITOT 1.0  PROT 6.3*  ALBUMIN 2.6*   No results for input(s): LIPASE, AMYLASE in the last 168 hours. No results for input(s): AMMONIA in the last 168 hours. Coagulation Profile: Recent Labs  Lab 08/10/19 0525  INR 1.7*   Cardiac Enzymes: No results for input(s): CKTOTAL, CKMB, CKMBINDEX, TROPONINI in the last 168 hours. BNP (last 3 results) No results for input(s): PROBNP in the last 8760 hours. HbA1C: No results for input(s): HGBA1C in the last 72 hours. CBG: No results for input(s): GLUCAP in the last 168 hours. Lipid Profile: No results for input(s): CHOL, HDL, LDLCALC, TRIG, CHOLHDL, LDLDIRECT in the  last 72 hours. Thyroid Function Tests: No results for input(s): TSH, T4TOTAL, FREET4, T3FREE, THYROIDAB in the last 72 hours. Anemia Panel: No results for input(s): VITAMINB12, FOLATE, FERRITIN, TIBC, IRON, RETICCTPCT in the last 72 hours. Sepsis Labs: No results for input(s): PROCALCITON, LATICACIDVEN in the last 168 hours.  Recent Results (from the past 240 hour(s))  SARS Coronavirus 2 by RT PCR (hospital order, performed in [facility] hospital lab) Nasopharyngeal Nasopharyngeal Swab     Status: None   Collection Time: 08/10/19  4:44 AM   Specimen: Nasopharyngeal Swab  Result Value Ref Range Status   SARS Coronavirus 2 NEGATIVE NEGATIVE Final    Comment: (NOTE) SARS-CoV-2 target nucleic acids are NOT DETECTED. The SARS-CoV-2 RNA is generally detectable in upper and lower respiratory specimens during the acute phase of infection. The lowest concentration of SARS-CoV-2 viral copies this assay can detect is 250 copies / mL. A negative result does not preclude SARS-CoV-2 infection and should not be used as the sole basis for treatment or other patient management decisions.  A negative result may occur with improper specimen collection / handling, submission of specimen other than nasopharyngeal swab, presence of viral mutation(s) within the areas targeted by this assay, and inadequate number of viral copies (<250 copies / mL). A negative result must be combined with clinical observations, patient history, and epidemiological information. Fact Sheet for Patients:   StrictlyIdeas.no Fact Sheet for Healthcare Providers: BankingDealers.co.za This test is not yet approved or cleared  by the [place] FDA and has been authorized for detection and/or diagnosis of SARS-CoV-2 by FDA under an Emergency Use Authorization (EUA).  This EUA will remain in effect (meaning this test can be used) for the duration of the COVID-19 declaration under  Section 564(b)(1) of the Act, 21 U.S.C. section 360bbb-3(b)(1), unless the authorization is terminated or revoked sooner. Performed at [facility], [place] [address] [place], [place] [address]   MRSA PCR Screening     Status: Abnormal   Collection Time: 08/10/19  6:40 PM   Specimen: Nasopharyngeal  Result Value Ref Range Status  MRSA by PCR POSITIVE (A) NEGATIVE Final    Comment:        The GeneXpert MRSA Assay (FDA approved for NASAL specimens only), is one component of a comprehensive MRSA colonization surveillance program. It is not intended to diagnose MRSA infection nor to guide or monitor treatment for MRSA infections. RESULT CALLED TO, READ BACK BY AND VERIFIED WITH: Judithann Graves @ 2200 08/11/2019 PERRY, J. Performed at [facility], [place] [address], [place], [place] [address]       Radiology Studies: DG CHEST PORT 1 VIEW  Result Date: 08/15/2019 CLINICAL DATA:  Fever EXAM: PORTABLE CHEST 1 VIEW COMPARISON:  08/10/2019 FINDINGS: Cardiomegaly. Improving aeration in the right base with continued left lower lobe atelectasis or infiltrate. Small left pleural effusion suspected. No overt edema. No acute bony abnormality. IMPRESSION: Improved aeration on the right. Left lower lobe atelectasis or infiltrate/pneumonia with small left effusion. Electronically Signed   By: Rolm Baptise M.D.   On: 08/15/2019 14:38     LOS: 6 days   Antonieta Pert, MD [facility]  08/16/2019, 2:34 PM

## 2019-08-17 DIAGNOSIS — I5033 Acute on chronic diastolic (congestive) heart failure: Secondary | ICD-10-CM

## 2019-08-17 LAB — CBC
HCT: 27.6 % — ABNORMAL LOW (ref 39.0–52.0)
Hemoglobin: 8.8 g/dL — ABNORMAL LOW (ref 13.0–17.0)
MCH: 28.9 pg (ref 26.0–34.0)
MCHC: 31.9 g/dL (ref 30.0–36.0)
MCV: 90.5 fL (ref 80.0–100.0)
Platelets: 251 10*3/uL (ref 150–400)
RBC: 3.05 MIL/uL — ABNORMAL LOW (ref 4.22–5.81)
RDW: 15.9 % — ABNORMAL HIGH (ref 11.5–15.5)
WBC: 7.7 10*3/uL (ref 4.0–10.5)
nRBC: 0 % (ref 0.0–0.2)

## 2019-08-17 LAB — BASIC METABOLIC PANEL
Anion gap: 9 (ref 5–15)
BUN: 39 mg/dL — ABNORMAL HIGH (ref 8–23)
CO2: 25 mmol/L (ref 22–32)
Calcium: 8.4 mg/dL — ABNORMAL LOW (ref 8.9–10.3)
Chloride: 98 mmol/L (ref 98–111)
Creatinine, Ser: 1.32 mg/dL — ABNORMAL HIGH (ref 0.61–1.24)
GFR calc Af Amer: 53 mL/min — ABNORMAL LOW (ref >60)
GFR calc non Af Amer: 46 mL/min — ABNORMAL LOW (ref >60)
Glucose, Bld: 104 mg/dL — ABNORMAL HIGH (ref 70–99)
Potassium: 4 mmol/L (ref 3.5–5.1)
Sodium: 132 mmol/L — ABNORMAL LOW (ref 135–145)

## 2019-08-17 MED ORDER — FUROSEMIDE 20 MG PO TABS: 20.0000 mg | ORAL_TABLET | Freq: Every day | ORAL | 0 refills | Status: DC

## 2019-08-17 NOTE — TOC Progression Note (Signed)
Transition of Care [facility]) - Progression Note    Patient Details  Name: Cody Mendoza MRN: [id number] Date of Birth: Oct 25, 1921  Transition of [facility]) CM/SW Contact  Joaquin Courts, RN Phone Number: 08/17/2019, 1:30 PM  Clinical Narrative:    PTAR transportation arranged.    Expected Discharge Plan: Home/Self Care ([facility]) Barriers to Discharge: Continued Medical Work up  Expected Discharge Plan and Services Expected Discharge Plan: Home/Self Care ([facility])   Discharge Planning Services: CM Consult   Living arrangements for the past 2 months: [place] Expected Discharge Date: 08/17/19                         HH Arranged: PT, OT HH Agency: Other - See comment ([facility]-contract w/[facility]) Date HH Agency Contacted: 08/16/19 Time [place]: 1214     Social Determinants of Health (SDOH) Interventions    Readmission Risk Interventions Readmission Risk Prevention Plan 08/12/2019  Transportation Screening Complete  Medication Review [place]) Complete  PCP or Specialist appointment within 3-5 days of discharge Complete  HRI or [place] Complete  SW Recovery Care/Counseling Consult Complete  [place] Not Applicable  Some recent data might be hidden

## 2019-08-17 NOTE — Discharge Summary (Signed)
Physician Discharge Summary  Cody Mendoza CNO:[id number] DOB: 20-Oct-1921 DOA: 08/10/2019  PCP: Crist Infante, MD  Admit date: 08/10/2019 Discharge date: 08/17/2019  Admitted From:ILF Disposition:ILF  Recommendations for Outpatient Follow-up:  1. Follow up with PCP in 1-2 weeks 2. Please obtain BMP/CBC in one week 3. Please follow up on the following pending results:  Home Health: yes  Equipment/Devices: none  Discharge Condition: Stable Code Status:DNR Diet recommendation:  Diet Order            Diet - low sodium heart healthy           Diet renal with fluid restriction Fluid restriction: 1200 mL Fluid; Room service appropriate? Yes; Fluid consistency: Thin  Diet effective now                 Brief/Interim Summary: 86 y.o.malewith medical history significant ofatrial fibrillation, diverticulosis, hypertension, heart failure diastolic dysfunction with preserved ejection fraction EF 55 to 60% as of April 2021, asthma, GERD presented with somewhat of an acute exacerbation of shortness of breath from [facility] facility where he lives with his wife. Patient indicated that shortness of breath was worse when supine, improved with sitting upright as well as worse with exertion. Patient reported some swelling in his lower extremities that appears to be dependent, improving with rest.In ED,patient had chest x-ray consistent with bibasilar opacifications concerning for effusion although pneumonia cannot be ruled out. Admission labs showed anemia with hemoglobin at 7.8 with a baseline around 9 per previous chart review. Blood ordered in the ED, was given Lasix 40x1 for presumed heart failure  Patient is admitted managed with IV diuresis with negative negative balance of almost 9 L. Overall is improved and he is on oral Lasix. She had episode of fever and so lethargy acute metabolic encephalopathy post Ativan.  Initially concern for aspiration pneumonia however  patient has no recurrent of fever did not require antibiotics and has been doing well alert awake, no leukocytosis. Overall patient is medically stable and plan is for discharge to independent living facility as per patient's family's wishes given his good social support. I discussed discharge plan of care with patient's son over the phone today.  Discharge Diagnoses:  Active Problems:   GERD   Hypertension   Atrial fibrillation (HCC)   CKD (chronic kidney disease) stage 2, GFR 60-89 ml/min   Chronic anemia   Acute CHF (congestive heart failure) (HCC)   Acute exacerbation of congestive heart failure (HCC)   Hypokalemia   Goals of care, counseling/discussion   DNR (do not resuscitate)   Palliative care by specialist  Episode of fever 100.6 100.8- on 7 am 6/10:No recurrence of fever. Patient was somnolent and had coarse breathing concern for aspiration pneumonitis-CXR obtained  6/10 AM showed improved aeration on the right, left lower lobe atelectasis or infiltrate/pneumonia with a small left pleural effusion.  Could be secondary atelectasis, no obvious pneumonia, no recurrence of fever WBC down trended.  Encourage ambulation supportive care at independent living facility.  Acute diastolic CHF exacerbation - patient received IV Lasix with significant output, net 9.2 L negative balance.  Monitor intake output.  Daily weight.  Continue p.o. Lasix 20 mg daily yes function overall stable BUN 38 creatinine 1.3.  Tolerating Lasix, follow-up BMP next week if has poor intake hold off on Lasix.  Acute metabolic encephalopathy with confusion/lethargy-status post IV Ativan 2 days ago and resulting with lethargy.  This has resolved.  Patient is alert awake, hard of hearing.  He is following commands.  CKD stage II: BUN/creatinine overall stable.  Monitor.    BMP in 1 week at the facility or at PCP  Hypertension: Controlled on amlodipine, Cardura, metoprolol and Lasix.  Atrial fibrillation,  chronic/persistent: Rate controlled on metoprolol, Eliquis for anticoagulation  Hyponatremia sodium  Bmp in 1 wk. Stable  Chronic anemia: Hemoglobin stable at transfusion Recent Labs  Lab 08/12/19 0256 08/13/19 0620 08/14/19 0542 08/16/19 0522 08/17/19 0408  HGB 9.7* 9.5* 9.9* 9.2* 8.8*  HCT 30.4* 30.5* 30.9* 29.1* 27.6*  Follow-up CBC in 1 week  Plan of care discussed with patient son over the phone he would like to give a try for the patient to return to a prescription for he has lot of assistance.  I shared my concern given his overlays deconditioning high risk for readmission/deterioration.  If patient fails he will consider skilled nursing facility as next measure. We discussed about need for monitoring of his blood test/ BMP while on Lasix next week and also hold Lasix if his oral intake is not adequate.  Consults:  Palliative care  Subjective: Patient is alert awake oriented to hospital, current president , his date of birth.  Follows commands, nonfocal on exam is hard of hearing. Discharge Exam: Vitals:   08/16/19 2031 08/17/19 0500  BP: 130/73 130/80  Pulse: 73 80  Resp: 18   Temp: 98 F (36.7 C) 98 F (36.7 C)  SpO2: 94% 95%   General: Pt is alert, awake, hard of hearing, baseline orientation not in acute distress Cardiovascular: RRR, S1/S2 +, no rubs, no gallops Respiratory: CTA bilaterally, no wheezing, no rhonchi Abdominal: Soft, NT, ND, bowel sounds + Extremities: no edema, no cyanosis  Discharge Instructions  Discharge Instructions    (HEART FAILURE PATIENTS) Call MD:  Anytime you have any of the following symptoms: 1) 3 pound weight gain in 24 hours or 5 pounds in 1 week 2) shortness of breath, with or without a dry hacking cough 3) swelling in the hands, feet or stomach 4) if you have to sleep on extra pillows at night in order to breathe.   Complete by: As directed    Amb Referral to HF Clinic   Complete by: As directed    Diet - low sodium heart  healthy   Complete by: As directed    Discharge instructions   Complete by: As directed    Please check CBC and BMP in 5 to 7 days from PCP  Please call call MD or return to ER for similar or worsening recurring problem that brought you to hospital or if any fever,nausea/vomiting,abdominal pain, uncontrolled pain, chest pain,  shortness of breath or any other alarming symptoms.  Please follow-up your doctor as instructed in a week time and call the office for appointment.  Please avoid alcohol, smoking, or any other illicit substance and maintain healthy habits including taking your regular medications as prescribed.  You were cared for by a hospitalist during your hospital stay. If you have any questions about your discharge medications or the care you received while you were in the hospital after you are discharged, you can call the unit and ask to speak with the hospitalist on call if the hospitalist that took care of you is not available.  Once you are discharged, your primary care physician will handle any further medical issues. Please note that NO REFILLS for any discharge medications will be authorized once you are discharged, as it is imperative that you return to  your primary care physician (or establish a relationship with a primary care physician if you do not have one) for your aftercare needs so that they can reassess your need for medications and monitor your lab values   Increase activity slowly   Complete by: As directed      Allergies as of 08/17/2019   No Known Allergies     Medication List    TAKE these medications   amLODipine 5 MG tablet Commonly known as: NORVASC Take 1 tablet (5 mg total) by mouth daily. What changed: when to take this   apixaban 5 MG Tabs tablet Commonly known as: Eliquis Take 1 tablet (5 mg total) by mouth 2 (two) times daily.   doxazosin 4 MG tablet Commonly known as: CARDURA Take 0.5 tablets (2 mg total) by mouth daily.   furosemide  20 MG tablet Commonly known as: LASIX Take 1 tablet (20 mg total) by mouth daily.   ICAPS AREDS FORMULA PO Take 1 capsule by mouth 2 (two) times daily with breakfast and lunch.   latanoprost 0.005 % ophthalmic solution Commonly known as: XALATAN Place 1 drop into both eyes at bedtime.   levalbuterol 0.63 MG/3ML nebulizer solution Commonly known as: XOPENEX Take 3 mLs (0.63 mg total) by nebulization every 6 (six) hours as needed for wheezing or shortness of breath.   metoprolol succinate 25 MG 24 hr tablet Commonly known as: TOPROL-XL Take 25 mg by mouth daily.   mirtazapine 15 MG tablet Commonly known as: REMERON Take 15 mg by mouth at bedtime.   multivitamin with minerals Tabs tablet Take 1 tablet by mouth daily with supper.   pantoprazole 40 MG tablet Commonly known as: PROTONIX Take 40 mg by mouth every morning.   PROBIOTIC ACIDOPHILUS PO Take 1 capsule by mouth daily with supper.   Systane 0.4-0.3 % Soln Generic drug: Polyethyl Glycol-Propyl Glycol Place 1 drop into both eyes at bedtime.   Vitamin B12 500 MCG Tabs Take 500 mcg by mouth every morning.       Follow-up Information    Crist Infante, MD Follow up in 1 week(s).   Specialty: Internal Medicine Why: CBC and BMP in 1 week Contact information: [place] [address] [phone]        Evans Lance, MD .   Specialty: Cardiology Contact information: [phone] [address] [address] [phone]              No Known Allergies  The results of significant diagnostics from this hospitalization (including imaging, microbiology, ancillary and laboratory) are listed below for reference.    Microbiology: Recent Results (from the past 240 hour(s))  SARS Coronavirus 2 by RT PCR (hospital order, performed in [facility] hospital lab) Nasopharyngeal Nasopharyngeal Swab     Status: None   Collection Time: 08/10/19  4:44 AM   Specimen: Nasopharyngeal Swab   Result Value Ref Range Status   SARS Coronavirus 2 NEGATIVE NEGATIVE Final    Comment: (NOTE) SARS-CoV-2 target nucleic acids are NOT DETECTED. The SARS-CoV-2 RNA is generally detectable in upper and lower respiratory specimens during the acute phase of infection. The lowest concentration of SARS-CoV-2 viral copies this assay can detect is 250 copies / mL. A negative result does not preclude SARS-CoV-2 infection and should not be used as the sole basis for treatment or other patient management decisions.  A negative result may occur with improper specimen collection / handling, submission of specimen other than nasopharyngeal swab,  presence of viral mutation(s) within the areas targeted by this assay, and inadequate number of viral copies (<250 copies / mL). A negative result must be combined with clinical observations, patient history, and epidemiological information. Fact Sheet for Patients:   StrictlyIdeas.no Fact Sheet for Healthcare Providers: BankingDealers.co.za This test is not yet approved or cleared  by the [place] FDA and has been authorized for detection and/or diagnosis of SARS-CoV-2 by FDA under an Emergency Use Authorization (EUA).  This EUA will remain in effect (meaning this test can be used) for the duration of the COVID-19 declaration under Section 564(b)(1) of the Act, 21 U.S.C. section 360bbb-3(b)(1), unless the authorization is terminated or revoked sooner. Performed at [facility], [place] [address] [place], [place] [address]   MRSA PCR Screening     Status: Abnormal   Collection Time: 08/10/19  6:40 PM   Specimen: Nasopharyngeal  Result Value Ref Range Status   MRSA by PCR POSITIVE (A) NEGATIVE Final    Comment:        The GeneXpert MRSA Assay (FDA approved for NASAL specimens only), is one component of a comprehensive MRSA colonization surveillance program. It is not intended to  diagnose MRSA infection nor to guide or monitor treatment for MRSA infections. RESULT CALLED TO, READ BACK BY AND VERIFIED WITH: Judithann Graves @ 2200 08/11/2019 PERRY, J. Performed at [facility], [place] [address], [place], [place] [address]     Procedures/Studies: DG CHEST PORT 1 VIEW  Result Date: 08/15/2019 CLINICAL DATA:  Fever EXAM: PORTABLE CHEST 1 VIEW COMPARISON:  08/10/2019 FINDINGS: Cardiomegaly. Improving aeration in the right base with continued left lower lobe atelectasis or infiltrate. Small left pleural effusion suspected. No overt edema. No acute bony abnormality. IMPRESSION: Improved aeration on the right. Left lower lobe atelectasis or infiltrate/pneumonia with small left effusion. Electronically Signed   By: Rolm Baptise M.D.   On: 08/15/2019 14:38   DG Chest Portable 1 View  Result Date: 08/10/2019 CLINICAL DATA:  Patient states shortness of breath x1.5 hours. Patient lives at [facility] independent [facility]. Patient 92% RA with expiratory wheezing EXAM: PORTABLE CHEST 1 VIEW COMPARISON:  07/05/2019 FINDINGS: Cardiac silhouette is mildly enlarged. No mediastinal or hilar masses. There is vascular congestion, mild interstitial thickening and hazy opacity at both lung bases, the latter finding obscuring hemidiaphragms consistent with pleural effusions and atelectasis. No pneumothorax. Skeletal structures are grossly intact. IMPRESSION: 1. Findings support congestive heart failure associated with small effusions. Lung base pneumonia is not excluded and should be considered in the proper clinical setting. Electronically Signed   By: Lajean Manes M.D.   On: 08/10/2019 05:49    Labs: BNP (last 3 results) Recent Labs    06/26/19 2203 08/10/19 0444  BNP 165.6* 924.2*   Basic Metabolic Panel: Recent Labs  Lab 08/12/19 0256 08/12/19 0256 08/13/19 0620 08/14/19 0542 08/15/19 0531 08/16/19 0522 08/17/19 0408  NA 134*   < > 133* 133* 134* 130* 132*  K 4.1   <  > 4.4 4.5 4.6 4.2 4.0  CL 94*   < > 98 98 99 94* 98  CO2 32   < > 28 26 25 24 25   GLUCOSE 114*   < > 103* 117* 108* 109* 104*  BUN 38*   < > 40* 41* 36* 38* 39*  CREATININE 1.51*   < > 1.45* 1.32* 1.25* 1.35* 1.32*  CALCIUM 8.1*   < > 8.3* 8.7* 8.6* 8.2* 8.4*  MG 1.6*  --  2.0  --   --  1.9  --   PHOS  --   --   --   --   --  3.5  --    < > = values in this interval not displayed.   Liver Function Tests: Recent Labs  Lab 08/16/19 0522  AST 17  ALT 13  ALKPHOS 97  BILITOT 1.0  PROT 6.3*  ALBUMIN 2.6*   No results for input(s): LIPASE, AMYLASE in the last 168 hours. No results for input(s): AMMONIA in the last 168 hours. CBC: Recent Labs  Lab 08/12/19 0256 08/13/19 0620 08/14/19 0542 08/16/19 0522 08/17/19 0408  WBC 9.6 8.8 9.6 7.0 7.7  HGB 9.7* 9.5* 9.9* 9.2* 8.8*  HCT 30.4* 30.5* 30.9* 29.1* 27.6*  MCV 92.4 93.6 92.5 92.7 90.5  PLT 248 237 273 253 251   Cardiac Enzymes: No results for input(s): CKTOTAL, CKMB, CKMBINDEX, TROPONINI in the last 168 hours. BNP: Invalid input(s): POCBNP CBG: No results for input(s): GLUCAP in the last 168 hours. D-Dimer No results for input(s): DDIMER in the last 72 hours. Hgb A1c No results for input(s): HGBA1C in the last 72 hours. Lipid Profile No results for input(s): CHOL, HDL, LDLCALC, TRIG, CHOLHDL, LDLDIRECT in the last 72 hours. Thyroid function studies No results for input(s): TSH, T4TOTAL, T3FREE, THYROIDAB in the last 72 hours.  Invalid input(s): FREET3 Anemia work up No results for input(s): VITAMINB12, FOLATE, FERRITIN, TIBC, IRON, RETICCTPCT in the last 72 hours. Urinalysis    Component Value Date/Time   COLORURINE STRAW (A) 07/05/2019 1351   APPEARANCEUR CLEAR 07/05/2019 1351   LABSPEC 1.008 07/05/2019 1351   PHURINE 5.0 07/05/2019 1351   GLUCOSEU NEGATIVE 07/05/2019 1351   HGBUR SMALL (A) 07/05/2019 1351   BILIRUBINUR NEGATIVE 07/05/2019 1351   KETONESUR NEGATIVE 07/05/2019 1351   PROTEINUR NEGATIVE  07/05/2019 1351   UROBILINOGEN 1.0 09/21/2014 1610   NITRITE NEGATIVE 07/05/2019 1351   LEUKOCYTESUR NEGATIVE 07/05/2019 1351   Sepsis Labs Invalid input(s): PROCALCITONIN,  WBC,  LACTICIDVEN Microbiology Recent Results (from the past 240 hour(s))  SARS Coronavirus 2 by RT PCR (hospital order, performed in [facility] hospital lab) Nasopharyngeal Nasopharyngeal Swab     Status: None   Collection Time: 08/10/19  4:44 AM   Specimen: Nasopharyngeal Swab  Result Value Ref Range Status   SARS Coronavirus 2 NEGATIVE NEGATIVE Final    Comment: (NOTE) SARS-CoV-2 target nucleic acids are NOT DETECTED. The SARS-CoV-2 RNA is generally detectable in upper and lower respiratory specimens during the acute phase of infection. The lowest concentration of SARS-CoV-2 viral copies this assay can detect is 250 copies / mL. A negative result does not preclude SARS-CoV-2 infection and should not be used as the sole basis for treatment or other patient management decisions.  A negative result may occur with improper specimen collection / handling, submission of specimen other than nasopharyngeal swab, presence of viral mutation(s) within the areas targeted by this assay, and inadequate number of viral copies (<250 copies / mL). A negative result must be combined with clinical observations, patient history, and epidemiological information. Fact Sheet for Patients:   StrictlyIdeas.no Fact Sheet for Healthcare Providers: BankingDealers.co.za This test is not yet approved or cleared  by the [place] FDA and has been authorized for detection and/or diagnosis of SARS-CoV-2 by FDA under an Emergency Use Authorization (EUA).  This EUA will remain in effect (meaning this test can be used) for the duration of the COVID-19 declaration under Section 564(b)(1) of  the Act, 21 U.S.C. section 360bbb-3(b)(1), unless the authorization is terminated or revoked  sooner. Performed at [facility], [place] [address] [place], [place] [address]   MRSA PCR Screening     Status: Abnormal   Collection Time: 08/10/19  6:40 PM   Specimen: Nasopharyngeal  Result Value Ref Range Status   MRSA by PCR POSITIVE (A) NEGATIVE Final    Comment:        The GeneXpert MRSA Assay (FDA approved for NASAL specimens only), is one component of a comprehensive MRSA colonization surveillance program. It is not intended to diagnose MRSA infection nor to guide or monitor treatment for MRSA infections. RESULT CALLED TO, READ BACK BY AND VERIFIED WITH: Judithann Graves @ 2200 08/11/2019 PERRY, J. Performed at [facility] [place] [address] [place], [place] [address]      Time coordinating discharge: 35  minutes  SIGNED: Antonieta Pert, MD  [facility] 08/17/2019, 10:54 AM  If 7PM-7AM, please contact night-coverage [email or web address]

## 2019-08-17 NOTE — Progress Notes (Signed)
PTAR here for patient, son Cody Mendoza notified.

## 2019-08-26 DIAGNOSIS — I5032 Chronic diastolic (congestive) heart failure: Secondary | ICD-10-CM | POA: Insufficient documentation

## 2019-09-23 DIAGNOSIS — R600 Localized edema: Secondary | ICD-10-CM | POA: Insufficient documentation

## 2019-09-23 DIAGNOSIS — I110 Hypertensive heart disease with heart failure: Secondary | ICD-10-CM | POA: Insufficient documentation

## 2019-10-01 ENCOUNTER — Telehealth: Payer: Self-pay | Admitting: Cardiology

## 2019-10-01 NOTE — Telephone Encounter (Signed)
LVM for patient to return call to get scheduled with [facility] from recall list 

## 2019-10-14 ENCOUNTER — Telehealth: Payer: Self-pay | Admitting: Cardiology

## 2019-10-14 NOTE — Telephone Encounter (Signed)
Spoke with Meriel Pica (son). Patient is now under hospice care and they do not want to schedule a f/u appt. Said that if they had any questions or concerns they would give [place] a call

## 2019-11-01 ENCOUNTER — Telehealth: Payer: Self-pay | Admitting: *Deleted

## 2019-11-01 NOTE — Telephone Encounter (Signed)
Livign well at home.  [name], RN at [employer] ([facility]), calling for refill authorization for patient's doxycycline.  Patient given prescription 11/2018, was supposed to follow up in 1 month. He is available for virtual follow up and has ~2 weeks left of doxycycline per nursing. Medication is no longer active on his chart. Please advise. Landis Gandy, RN

## 2019-12-16 ENCOUNTER — Ambulatory Visit (INDEPENDENT_AMBULATORY_CARE_PROVIDER_SITE_OTHER): Payer: Medicare Other | Admitting: Podiatry

## 2019-12-16 ENCOUNTER — Other Ambulatory Visit: Payer: Self-pay

## 2019-12-16 ENCOUNTER — Encounter: Payer: Self-pay | Admitting: Podiatry

## 2019-12-16 DIAGNOSIS — M79675 Pain in left toe(s): Secondary | ICD-10-CM | POA: Diagnosis not present

## 2019-12-16 DIAGNOSIS — R600 Localized edema: Secondary | ICD-10-CM

## 2019-12-16 DIAGNOSIS — B351 Tinea unguium: Secondary | ICD-10-CM | POA: Diagnosis not present

## 2019-12-16 DIAGNOSIS — M79674 Pain in right toe(s): Secondary | ICD-10-CM

## 2019-12-16 NOTE — Progress Notes (Signed)
Subjective:  Patient ID: Cody Mendoza, male    DOB: 24-Mar-1921,  MRN: [id number]  86 y.o. male presents with long term blood thinner, Eliquis, and presents today with painful, discolored, thick toenails which interfere with daily activities.    His son is present during today's visit. Patient resides at [place] with his wife. Son voices no new pedal concerns on today's visit.  Son states Cody Mendoza is now taking 40 mg of furosemide as well as doxycycline 100 mg po twice daily due to low grade spinal infection. He will be on doxycycline the remainder of his life according to his son.  Review of Systems: Negative except as noted in the HPI.  Past Medical History:  Diagnosis Date  . AF (atrial fibrillation) ([place])   . Asthma   . Detached retina   . Discitis of thoracic region 08/2018  . Diverticulosis   . GERD (gastroesophageal reflux disease)   . [name] syndrome   . Hypertension   . Lactose intolerance   . Mitral insufficiency   . Renal calculi   . TIA (transient ischemic attack)    Past Surgical History:  Procedure Laterality Date  . cataract surgery    . ESOPHAGEAL DILATION     In the past  . FINGER TENDON REPAIR    . INGUINAL HERNIA REPAIR    . KIDNEY STONE SURGERY     Status post syrgical removal of a   . TONSILLECTOMY     Patient Active Problem List   Diagnosis Date Noted  . Goals of care, counseling/discussion   . DNR (do not resuscitate)   . Palliative care by specialist   . Hypokalemia 08/11/2019  . Acute exacerbation of congestive heart failure ([place]) 08/10/2019  . Acute CHF (congestive heart failure) ([place]) 06/26/2019  . Excessive cerumen in both ear canals 06/10/2019  . Sensorineural hearing loss (SNHL), bilateral 06/10/2019  . Lower extremity edema 09/13/2018  . Near syncope 08/28/2018  . Hyponatremia 08/28/2018  . TIA (transient ischemic attack) 08/27/2018  . Discitis of thoracic region 08/23/2018  . CKD (chronic kidney disease) stage 2, GFR  60-89 ml/min 08/23/2018  . Chronic anemia 08/23/2018  . Discitis 08/23/2018  . Puncture wound of right lower leg without foreign body 10/08/2013  . Mitral insufficiency 12/28/2010  . Atrial fibrillation ([place]) 11/16/2010  . Hypertension   . GERD 04/22/2010  . PERSONAL HX COLONIC POLYPS 04/22/2010    Current Outpatient Medications:  .  apixaban (ELIQUIS) 5 MG TABS tablet, Take 1 tablet (5 mg total) by mouth 2 (two) times daily., Disp: 180 tablet, Rfl: 3 .  Cyanocobalamin (VITAMIN B12) 500 MCG TABS, Take 500 mcg by mouth every morning. , Disp: , Rfl:  .  doxazosin (CARDURA) 4 MG tablet, Take 0.5 tablets (2 mg total) by mouth daily., Disp: 30 tablet, Rfl: 0 .  doxycycline (VIBRA-TABS) 100 MG tablet, Take 100 mg by mouth 2 (two) times daily., Disp: , Rfl:  .  Lactobacillus (PROBIOTIC ACIDOPHILUS PO), Take 1 capsule by mouth daily with supper. , Disp: , Rfl:  .  latanoprost (XALATAN) 0.005 % ophthalmic solution, Place 1 drop into both eyes at bedtime. , Disp: , Rfl: 11 .  levalbuterol (XOPENEX) 0.63 MG/3ML nebulizer solution, Take 3 mLs (0.63 mg total) by nebulization every 6 (six) hours as needed for wheezing or shortness of breath., Disp: , Rfl:  .  metoprolol succinate (TOPROL-XL) 50 MG 24 hr tablet, Take 50 mg by mouth daily., Disp: , Rfl:  .  mirtazapine (REMERON) 15 MG tablet, Take 15 mg by mouth at bedtime., Disp: , Rfl:  .  Morphine Sulfate (MORPHINE CONCENTRATE) 10 mg / 0.5 ml concentrated solution, Take by mouth., Disp: , Rfl:  .  Multiple Vitamin (MULTIVITAMIN WITH MINERALS) TABS, Take 1 tablet by mouth daily with supper. , Disp: , Rfl:  .  Multiple Vitamins-Minerals (ICAPS AREDS FORMULA PO), Take 1 capsule by mouth 2 (two) times daily with breakfast and lunch. , Disp: , Rfl:  .  pantoprazole (PROTONIX) 40 MG tablet, Take 40 mg by mouth every morning. , Disp: , Rfl:  .  Polyethyl Glycol-Propyl Glycol (SYSTANE) 0.4-0.3 % SOLN, Place 1 drop into both eyes at bedtime. , Disp: , Rfl:  .   amLODipine (NORVASC) 5 MG tablet, Take 1 tablet (5 mg total) by mouth daily. (Patient taking differently: Take 5 mg by mouth every evening. ), Disp: 30 tablet, Rfl: 0 .  furosemide (LASIX) 20 MG tablet, Take 1 tablet (20 mg total) by mouth daily. (Patient taking differently: Take 40 mg by mouth daily. ), Disp: 30 tablet, Rfl: 0 No Known Allergies Social History   Occupational History  . Occupation: Careers adviser: RETIRED  Tobacco Use  . Smoking status: Never Smoker  . Smokeless tobacco: Never Used  Vaping Use  . Vaping Use: Never used  Substance and Sexual Activity  . Alcohol use: Yes    Alcohol/week: 0.0 standard drinks    Comment: rarely  . Drug use: Never  . Sexual activity: Not on file    Objective:   Constitutional Pt is a pleasant 86 y.o. Caucasian male in NAD.Marland Kitchen AAO x 3.   Vascular Capillary fill time to digits <3 seconds b/l lower extremities. Nonpalpable DP pulse(s) b/l lower extremities. Nonpalpable PT pulse(s) b/l lower extremities. Pedal hair absent. Lower extremity skin temperature gradient within normal limits. Dependent rubor noted b/l lower extremities. +2 pitting edema b/l lower extremities. No ischemia or gangrene noted b/l lower extremities. Difficult to palpate pedal pulses due to LE edema. No cyanosis or clubbing noted. Wearing open toed TED hose b/l LE.  Neurologic Normal speech. Oriented to person, place, and time. Protective sensation intact 5/5 intact bilaterally with 10g monofilament b/l.  Dermatologic Pedal skin is thin shiny, atrophic b/l lower extremities. No open wounds bilaterally. No interdigital macerations bilaterally. Toenails 1-5 b/l elongated, discolored, dystrophic, thickened, crumbly with subungual debris and tenderness to dorsal palpation. No hyperkeratotic nor porokeratotic lesions present on today's visit.  Orthopedic: Normal muscle strength 5/5 to all lower extremity muscle groups bilaterally. No pain crepitus or joint  limitation noted with ROM b/l. No gross bony deformities bilaterally. Utilizes wheelchair for mobility assistance.   Radiographs: None Assessment:   1. Pain due to onychomycosis of toenails of both feet   2. Lower extremity edema    Plan:  Patient was evaluated and treated and all questions answered.  Onychomycosis with pain -Nails palliatively debridement as below. -Educated on self-care  Procedure: Nail Debridement Rationale: Pain Type of Debridement: manual, sharp debridement. Instrumentation: Nail nipper, rotary burr. Number of Nails: 10  -Examined patient. It is difficult to palpate pulses due to amount of pedal edema. -Toenails 1-5 b/l were debrided in length and girth with sterile nail nippers and dremel. Light bleeding left 5th digit addressed with Lunicain Hemostatic solution and triple antibiotic ointment. No further treatment required by caretaker.  -Patient to report any pedal injuries to medical professional immediately. -Patient to continue soft, supportive shoe gear daily. -Patient/POA  to call should there be question/concern in the interim.  Return in about 3 months (around 03/17/2020).  Marzetta Board, DPM

## 2020-03-18 ENCOUNTER — Other Ambulatory Visit: Payer: Self-pay

## 2020-03-18 ENCOUNTER — Encounter: Payer: Self-pay | Admitting: Podiatry

## 2020-03-18 ENCOUNTER — Ambulatory Visit (INDEPENDENT_AMBULATORY_CARE_PROVIDER_SITE_OTHER): Payer: Medicare Other | Admitting: Podiatry

## 2020-03-18 DIAGNOSIS — B351 Tinea unguium: Secondary | ICD-10-CM

## 2020-03-18 DIAGNOSIS — M79674 Pain in right toe(s): Secondary | ICD-10-CM

## 2020-03-18 DIAGNOSIS — M79675 Pain in left toe(s): Secondary | ICD-10-CM

## 2020-03-18 DIAGNOSIS — R600 Localized edema: Secondary | ICD-10-CM

## 2020-03-22 NOTE — Progress Notes (Signed)
Subjective:  Patient ID: Cody Mendoza, male    DOB: August 22, 1921,  MRN: [id number]  87 y.o. male presents with long term blood thinner, Eliquis, and presents today with painful, discolored, thick toenails which interfere with daily activities.    His son is present during today's visit. Patient resides at [place] with his wife. Son voices no new pedal concerns on today's visit.  Cody Mendoza takes doxycycline 100 mg po twice daily indefinitely due to low grade spinal infection.  Review of Systems: Negative except as noted in the HPI.  Past Medical History:  Diagnosis Date  . AF (atrial fibrillation) ([place])   . Asthma   . Detached retina   . Discitis of thoracic region 08/2018  . Diverticulosis   . GERD (gastroesophageal reflux disease)   . [name] syndrome   . Hypertension   . Lactose intolerance   . Mitral insufficiency   . Renal calculi   . TIA (transient ischemic attack)    Past Surgical History:  Procedure Laterality Date  . cataract surgery    . ESOPHAGEAL DILATION     In the past  . FINGER TENDON REPAIR    . INGUINAL HERNIA REPAIR    . KIDNEY STONE SURGERY     Status post syrgical removal of a   . TONSILLECTOMY     Patient Active Problem List   Diagnosis Date Noted  . Goals of care, counseling/discussion   . DNR (do not resuscitate)   . Palliative care by specialist   . Hypokalemia 08/11/2019  . Acute exacerbation of congestive heart failure ([place]) 08/10/2019  . Acute CHF (congestive heart failure) ([place]) 06/26/2019  . Excessive cerumen in both ear canals 06/10/2019  . Sensorineural hearing loss (SNHL), bilateral 06/10/2019  . Lower extremity edema 09/13/2018  . Near syncope 08/28/2018  . Hyponatremia 08/28/2018  . TIA (transient ischemic attack) 08/27/2018  . Discitis of thoracic region 08/23/2018  . CKD (chronic kidney disease) stage 2, GFR 60-89 ml/min 08/23/2018  . Chronic anemia 08/23/2018  . Discitis 08/23/2018  . Puncture wound of right  lower leg without foreign body 10/08/2013  . Mitral insufficiency 12/28/2010  . Atrial fibrillation ([place]) 11/16/2010  . Hypertension   . GERD 04/22/2010  . PERSONAL HX COLONIC POLYPS 04/22/2010    Current Outpatient Medications:  .  apixaban (ELIQUIS) 5 MG TABS tablet, Take 1 tablet (5 mg total) by mouth 2 (two) times daily., Disp: 180 tablet, Rfl: 3 .  Cyanocobalamin (VITAMIN B12) 500 MCG TABS, Take 500 mcg by mouth every morning. , Disp: , Rfl:  .  doxazosin (CARDURA) 4 MG tablet, Take 0.5 tablets (2 mg total) by mouth daily., Disp: 30 tablet, Rfl: 0 .  doxycycline (VIBRA-TABS) 100 MG tablet, Take 100 mg by mouth 2 (two) times daily., Disp: , Rfl:  .  Lactobacillus (PROBIOTIC ACIDOPHILUS PO), Take 1 capsule by mouth daily with supper. , Disp: , Rfl:  .  latanoprost (XALATAN) 0.005 % ophthalmic solution, Place 1 drop into both eyes at bedtime. , Disp: , Rfl: 11 .  levalbuterol (XOPENEX) 0.63 MG/3ML nebulizer solution, Take 3 mLs (0.63 mg total) by nebulization every 6 (six) hours as needed for wheezing or shortness of breath., Disp: , Rfl:  .  metoprolol succinate (TOPROL-XL) 50 MG 24 hr tablet, Take 50 mg by mouth daily., Disp: , Rfl:  .  mirtazapine (REMERON) 15 MG tablet, Take 15 mg by mouth at bedtime., Disp: , Rfl:  .  Morphine Sulfate (MORPHINE CONCENTRATE) 10 mg /  0.5 ml concentrated solution, Take by mouth., Disp: , Rfl:  .  Multiple Vitamin (MULTIVITAMIN WITH MINERALS) TABS, Take 1 tablet by mouth daily with supper. , Disp: , Rfl:  .  Multiple Vitamins-Minerals (ICAPS AREDS FORMULA PO), Take 1 capsule by mouth 2 (two) times daily with breakfast and lunch. , Disp: , Rfl:  .  pantoprazole (PROTONIX) 40 MG tablet, Take 40 mg by mouth every morning., Disp: , Rfl:  .  Polyethyl Glycol-Propyl Glycol 0.4-0.3 % SOLN, Place 1 drop into both eyes at bedtime. , Disp: , Rfl:  .  amLODipine (NORVASC) 5 MG tablet, Take 1 tablet (5 mg total) by mouth daily. (Patient taking differently: Take 5 mg by  mouth every evening. ), Disp: 30 tablet, Rfl: 0 .  furosemide (LASIX) 20 MG tablet, Take 1 tablet (20 mg total) by mouth daily. (Patient taking differently: Take 40 mg by mouth daily. ), Disp: 30 tablet, Rfl: 0 No Known Allergies Social History   Occupational History  . Occupation: Careers adviser: RETIRED  Tobacco Use  . Smoking status: Never Smoker  . Smokeless tobacco: Never Used  Vaping Use  . Vaping Use: Never used  Substance and Sexual Activity  . Alcohol use: Yes    Alcohol/week: 0.0 standard drinks    Comment: rarely  . Drug use: Never  . Sexual activity: Not on file    Objective:   Constitutional Pt is a pleasant 87 y.o. Caucasian male in NAD.Marland Kitchen AAO x 3.   Vascular Capillary fill time to digits <3 seconds b/l lower extremities. Nonpalpable DP pulse(s) b/l lower extremities. Nonpalpable PT pulse(s) b/l lower extremities. Pedal hair absent. Lower extremity skin temperature gradient within normal limits. Dependent rubor noted b/l lower extremities. +2 pitting edema b/l lower extremities. No ischemia or gangrene noted b/l lower extremities. Difficult to palpate pedal pulses due to LE edema. No cyanosis or clubbing noted. Wearing open toed TED hose b/l LE.  Neurologic Normal speech. Oriented to person, place, and time. Protective sensation intact 5/5 intact bilaterally with 10g monofilament b/l.  Dermatologic Pedal skin is thin shiny, atrophic b/l lower extremities. No open wounds bilaterally. No interdigital macerations bilaterally. Toenails 1-5 b/l elongated, discolored, dystrophic, thickened, crumbly with subungual debris and tenderness to dorsal palpation. No hyperkeratotic nor porokeratotic lesions present on today's visit.  Orthopedic: Normal muscle strength 5/5 to all lower extremity muscle groups bilaterally. No pain crepitus or joint limitation noted with ROM b/l. No gross bony deformities bilaterally. Utilizes wheelchair for mobility assistance.    Radiographs: None Assessment:   1. Pain due to onychomycosis of toenails of both feet   2. Lower extremity edema    Plan:  Patient was evaluated and treated and all questions answered.  Onychomycosis with pain -Nails palliatively debridement as below. -Educated on self-care  Procedure: Nail Debridement Rationale: Pain Type of Debridement: manual, sharp debridement. Instrumentation: Nail nipper, rotary burr. Number of Nails: 10  -Examined patient. It is difficult to palpate pulses due to amount of pedal edema. -No new changes. No new orders. -Toenails 1-5 b/l were debrided in length and girth with sterile nail nippers and dremel without iatrogenic laceration. -Patient to report any pedal injuries to medical professional immediately. -Patient to continue soft, supportive shoe gear daily. -Patient/POA to call should there be question/concern in the interim.  Return in about 3 months (around 06/16/2020).  Marzetta Board, DPM

## 2020-06-12 ENCOUNTER — Ambulatory Visit: Payer: Medicare Other | Admitting: Podiatry

## 2020-06-25 IMAGING — CT CT HEAD W/O CM
4 series · 16 of 47 positions shown, 18 images · non-contrast
Comparison: Brain MRI 08/27/2018, noncontrast head CT 08/27/2018

CLINICAL DATA: Vertigo, peripheral.

EXAM:
CT HEAD WITHOUT CONTRAST
TECHNIQUE: Contiguous axial images were obtained from the base of the skull
through the vertex without intravenous contrast.

[Series 2: head without · axial · non-contrast · 0.43mm/px · z∈[+556,+676]mm · 7 of 32 slices shown, 9 images]
[im 4/32  brain]
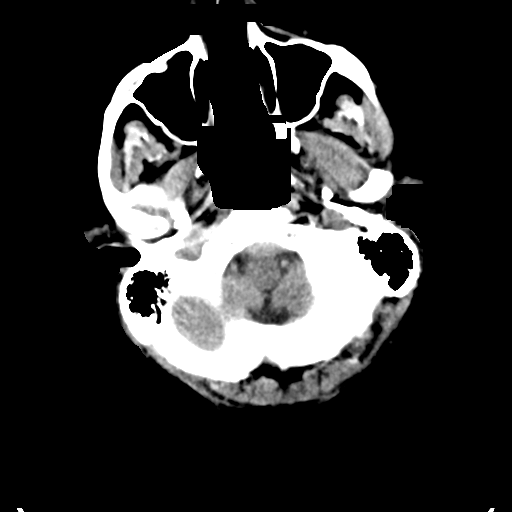
[im 4/32  bone]
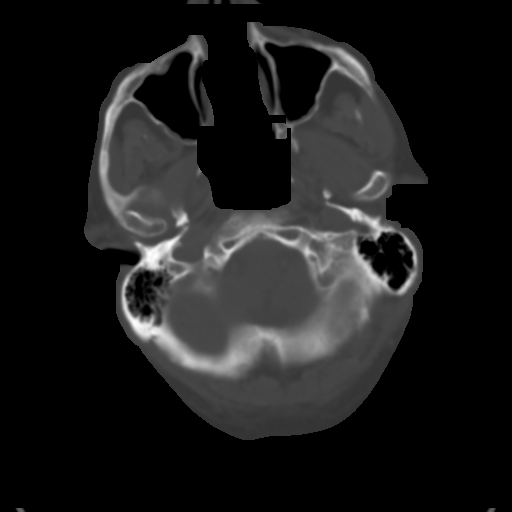
[im 8/32  brain]
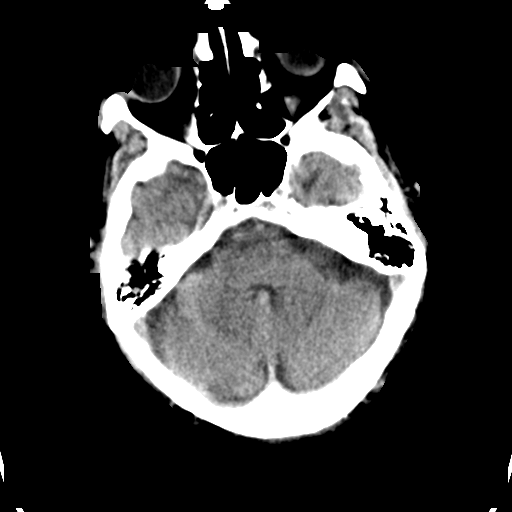
[im 12/32  brain]
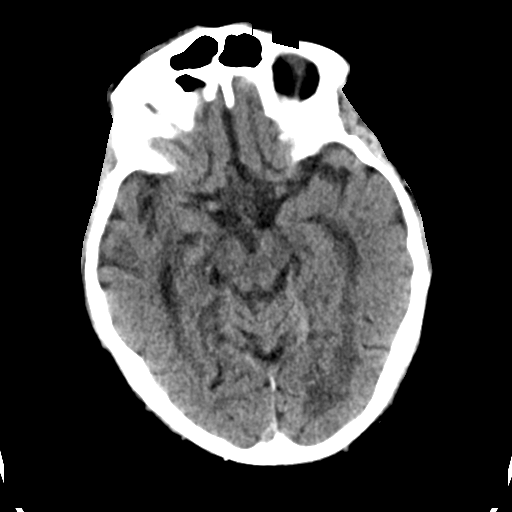
[im 16/32  brain]
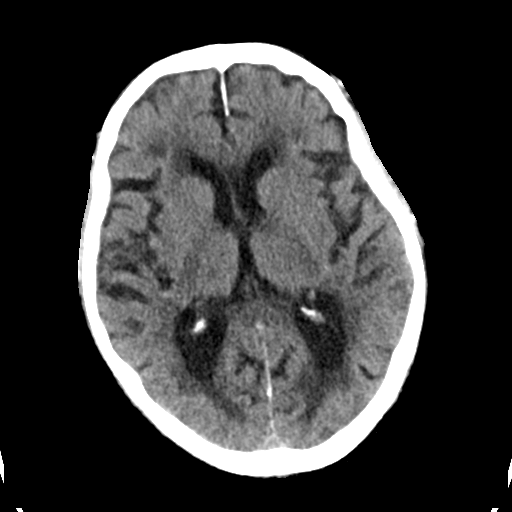
[im 20/32  brain]
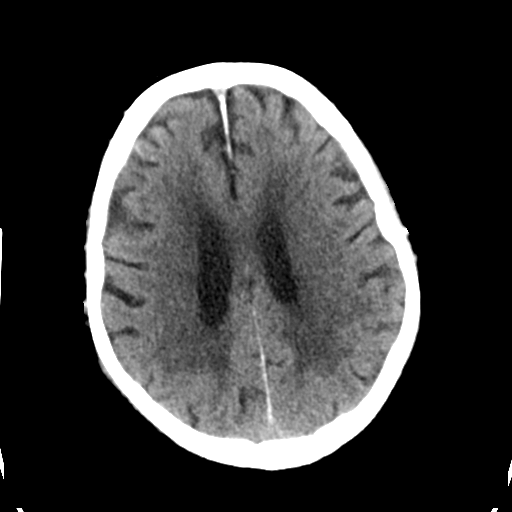
[im 20/32  bone]
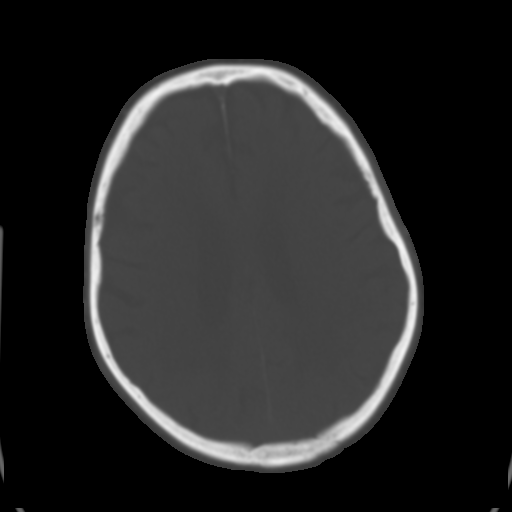
[im 24/32  brain]
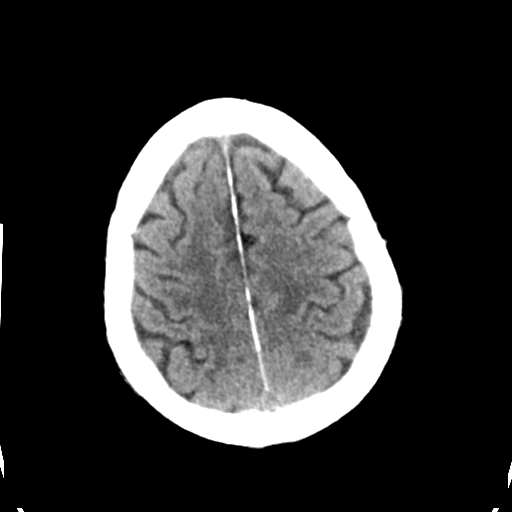
[im 28/32  brain]
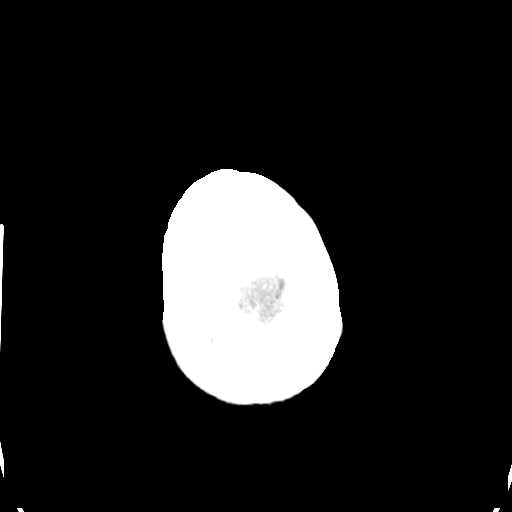

[Series 3: head bone · axial · 0.43mm/px · z∈[+555,+585]mm · 3 of 76 slices shown]
[im 8/76  bone]
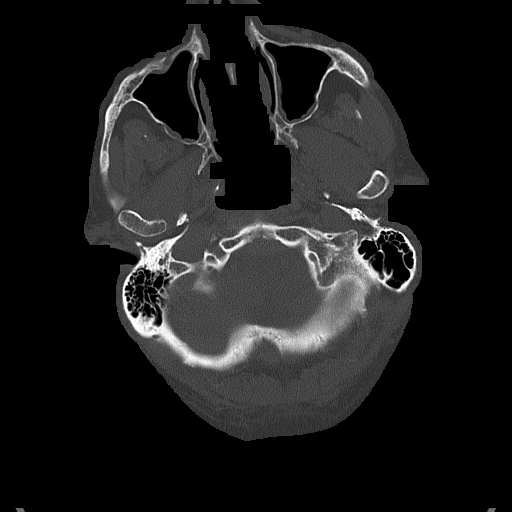
[im 16/76  bone]
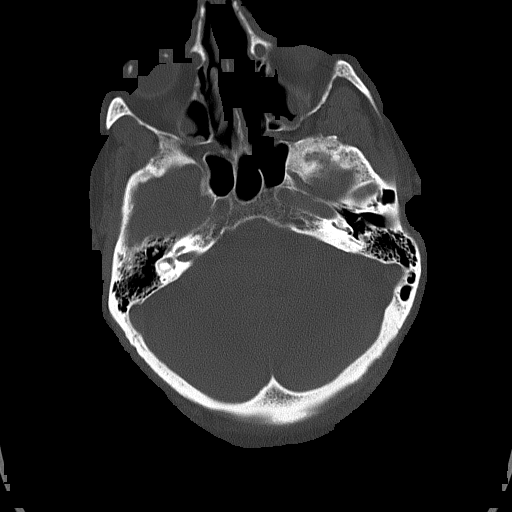
[im 23/76  bone]
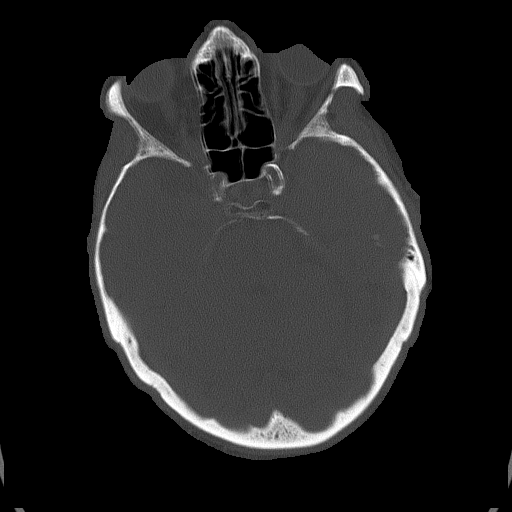

[Series 4: head without cor · coronal · non-contrast · 0.37mm/px · 3 of 72 slices shown]
[im 24/72  brain]
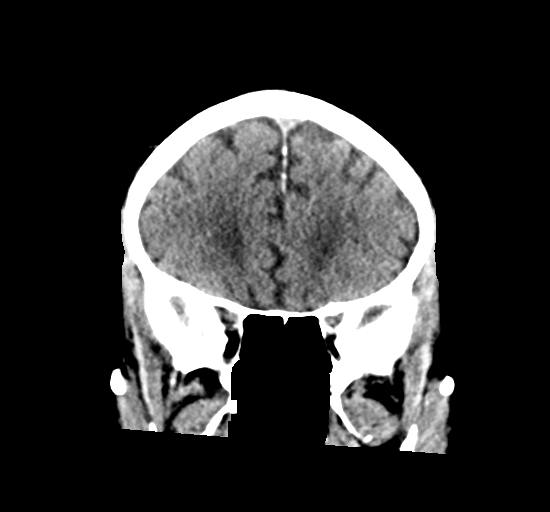
[im 32/72  brain]
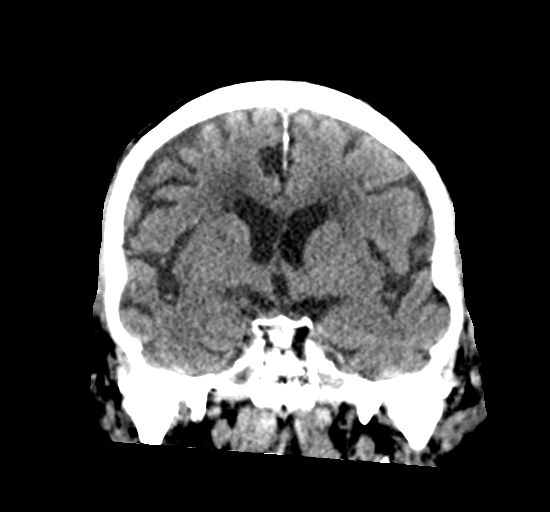
[im 40/72  brain]
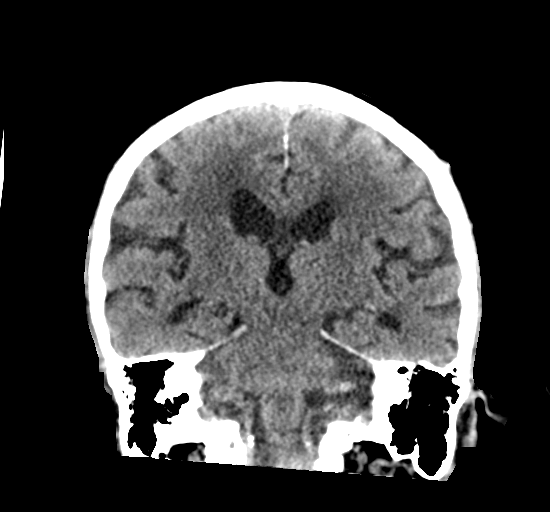

[Series 5: head without sag · sagittal · non-contrast · 0.37mm/px · 3 of 67 slices shown]
[im 23/67  brain]
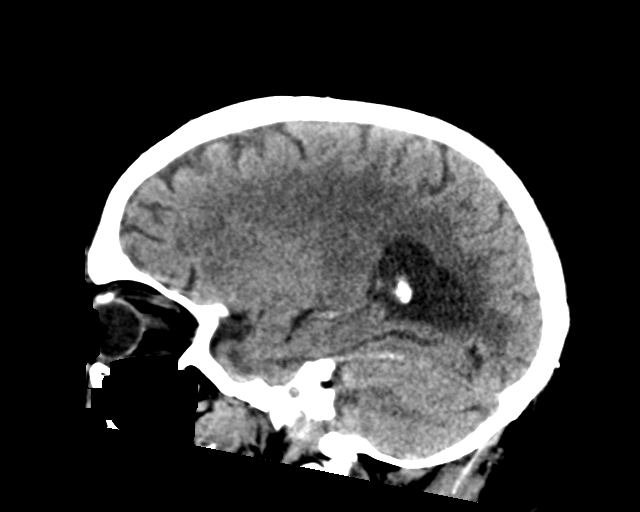
[im 34/67  brain]
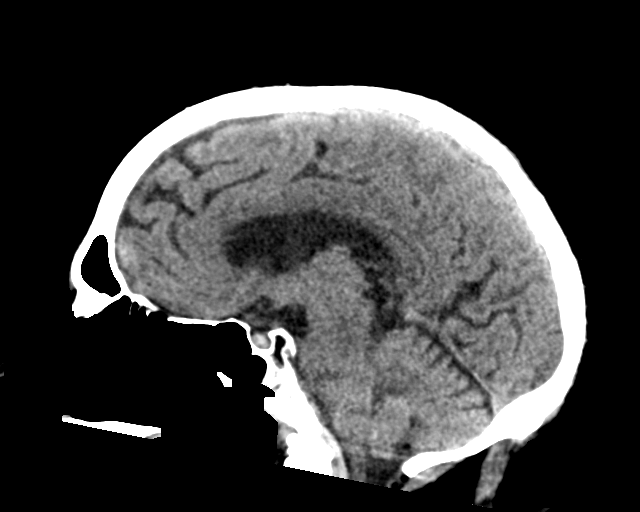
[im 45/67  brain]
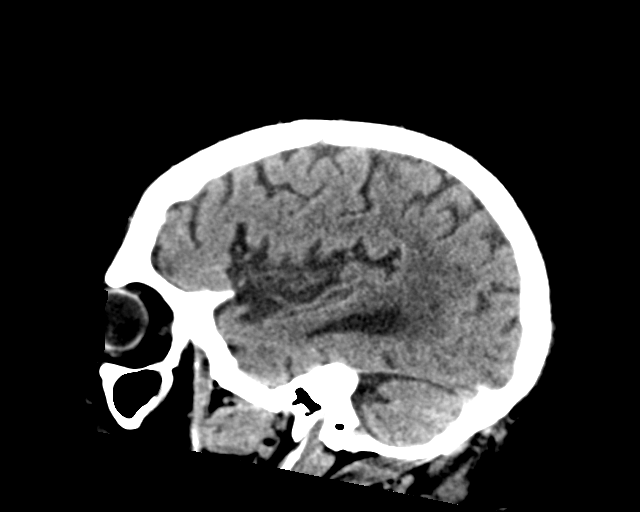

[16 of 47 positions shown; findings below may reference images not displayed]

FINDINGS: Brain:

No evidence of acute intracranial hemorrhage.

No demarcated cortical infarction.

No evidence of intracranial mass.

No midline shift or extra-axial fluid collection.

Advanced scattered and confluent hypodensity within the cerebral
white matter is nonspecific, but consistent with chronic small
vessel ischemic disease.

Moderate generalized parenchymal atrophy.

Vascular: No hyperdense vessel. Atherosclerotic calcifications.

Skull: No calvarial fracture or suspicious osseous lesion.

Sinuses/Orbits: Bilateral lens replacements. Mild paranasal sinus
mucosal thickening, greatest within the bilateral maxillary sinuses.
No significant mastoid effusion.
IMPRESSION: No evidence of acute intracranial abnormality.

Generalized parenchymal atrophy with advanced chronic small vessel
ischemic disease.

Mild bilateral maxillary sinus mucosal thickening.

## 2020-07-22 ENCOUNTER — Encounter: Payer: Self-pay | Admitting: Podiatry

## 2020-07-22 ENCOUNTER — Other Ambulatory Visit: Payer: Self-pay

## 2020-07-22 ENCOUNTER — Ambulatory Visit (INDEPENDENT_AMBULATORY_CARE_PROVIDER_SITE_OTHER): Payer: Medicare Other | Admitting: Podiatry

## 2020-07-22 DIAGNOSIS — D689 Coagulation defect, unspecified: Secondary | ICD-10-CM

## 2020-07-22 DIAGNOSIS — R600 Localized edema: Secondary | ICD-10-CM

## 2020-07-22 DIAGNOSIS — M79674 Pain in right toe(s): Secondary | ICD-10-CM

## 2020-07-22 DIAGNOSIS — B351 Tinea unguium: Secondary | ICD-10-CM | POA: Diagnosis not present

## 2020-07-22 DIAGNOSIS — N182 Chronic kidney disease, stage 2 (mild): Secondary | ICD-10-CM | POA: Diagnosis not present

## 2020-07-22 DIAGNOSIS — M79675 Pain in left toe(s): Secondary | ICD-10-CM | POA: Diagnosis not present

## 2020-07-22 NOTE — Progress Notes (Signed)
This patient returns to my office for at risk foot care.  This patient requires this care by a professional since this patient will be at risk due to having CKD and coagulation defect.  Patient is taking eliquis.  This patient is unable to cut nails himself since the patient cannot reach his nails.These nails are painful walking and wearing shoes.  This patient presents for at risk foot care today.  General Appearance  Alert, conversant and in no acute stress.  Vascular  Dorsalis pedis and posterior tibial  pulses are not  palpable due to swelling bilaterally.  Capillary return is within normal limits  bilaterally. Temperature is within normal limits  bilaterally.  Neurologic  Senn-Weinstein monofilament wire test within normal limits  bilaterally. Muscle power within normal limits bilaterally.  Nails Thick disfigured discolored nails with subungual debris  from hallux to fifth toes bilaterally. No evidence of bacterial infection or drainage bilaterally.  Orthopedic  No limitations of motion  feet .  No crepitus or effusions noted.  No bony pathology or digital deformities noted.  Skin  normotropic skin with no porokeratosis noted bilaterally.  No signs of infections or ulcers noted.     Onychomycosis  Pain in right toes  Pain in left toes  Consent was obtained for treatment procedures.   Mechanical debridement of nails 1-5  bilaterally performed with a nail nipper.  Filed with dremel without incident.    Return office visit    3 months                  Told patient to return for periodic foot care and evaluation due to potential at risk complications.   Gardiner Barefoot DPM

## 2020-10-28 ENCOUNTER — Ambulatory Visit (INDEPENDENT_AMBULATORY_CARE_PROVIDER_SITE_OTHER): Payer: Medicare Other | Admitting: Podiatry

## 2020-10-28 ENCOUNTER — Other Ambulatory Visit: Payer: Self-pay

## 2020-10-28 DIAGNOSIS — M79674 Pain in right toe(s): Secondary | ICD-10-CM

## 2020-10-28 DIAGNOSIS — M79675 Pain in left toe(s): Secondary | ICD-10-CM

## 2020-10-28 DIAGNOSIS — D689 Coagulation defect, unspecified: Secondary | ICD-10-CM

## 2020-10-28 DIAGNOSIS — B351 Tinea unguium: Secondary | ICD-10-CM

## 2020-11-01 ENCOUNTER — Encounter: Payer: Self-pay | Admitting: Podiatry

## 2020-11-01 NOTE — Progress Notes (Signed)
Subjective: Cody Mendoza is a pleasant 87 y.o. male patient seen today for at risk foot care with h/o coagulopathy and painful thick toenails that are difficult to trim. Pain interferes with ambulation. Aggravating factors include wearing enclosed shoe gear. Pain is relieved with periodic professional debridement.   His son is present during today's visit.   [name] voices no new pedal problems on today's visit.   He continues to take doxycycline 100 mg daily for low grade spinal infection. He resides at [facility] with his wife.  PCP is Crist Infante, MD. Last visit was: 10/05/2020.  No Known Allergies  Objective: Physical Exam  General: Cody Mendoza is a pleasant 87 y.o. Caucasian male, in NAD. AAO x 3.   Vascular:  Capillary fill time to digits <3 seconds b/l lower extremities. Nonpalpable DP pulse(s) b/l lower extremities. Nonpalpable PT pulse(s) b/l lower extremities. Pedal hair absent. Lower extremity skin temperature gradient within normal limits. Dependent rubor noted b/l lower extremities. Patient wearing compression hose on today's visit. +1 pitting edema right lower extremity. +2 pitting edema left lower extremity.  Dermatological:  Skin warm and supple b/l lower extremities. No open wounds b/l lower extremities. No interdigital macerations b/l lower extremities. Toenails 1-5 b/l elongated, discolored, dystrophic, thickened, crumbly with subungual debris and tenderness to dorsal palpation.  Musculoskeletal:  Muscle strength 4/5to all LE muscle groups of b/l lower extremities. No pain crepitus or joint limitation noted with ROM b/l lower extremities. No gross bony deformities b/l lower extremities. Utilizes rollator for ambulation assistance.  Neurological:  Protective sensation intact 5/5 intact bilaterally with 10g monofilament b/l.  Assessment and Plan:  1. Pain due to onychomycosis of toenails of both feet   2. Blood clotting disorder ([facility]CC)       -Examined patient. -Patient to continue soft, supportive shoe gear daily. -Toenails 1-5 b/l were debrided in length and girth with sterile nail nippers and dremel without iatrogenic bleeding.  -Patient to report any pedal injuries to medical professional immediately. -Patient/POA to call should there be question/concern in the interim.  Return in about 3 months (around 01/28/2021).  Marzetta Board, DPM

## 2021-01-20 ENCOUNTER — Other Ambulatory Visit: Payer: Self-pay

## 2021-01-20 ENCOUNTER — Encounter (HOSPITAL_COMMUNITY): Payer: Self-pay

## 2021-01-20 ENCOUNTER — Emergency Department (HOSPITAL_COMMUNITY)

## 2021-01-20 ENCOUNTER — Inpatient Hospital Stay (HOSPITAL_COMMUNITY)
Admission: EM | Admit: 2021-01-20 | Discharge: 2021-01-25 | DRG: 193 | Disposition: A | Discharge status: Hospice | Source: Skilled Nursing Facility | Attending: Internal Medicine | Admitting: Internal Medicine

## 2021-01-20 DIAGNOSIS — I340 Nonrheumatic mitral (valve) insufficiency: Secondary | ICD-10-CM | POA: Diagnosis present

## 2021-01-20 DIAGNOSIS — Z8249 Family history of ischemic heart disease and other diseases of the circulatory system: Secondary | ICD-10-CM

## 2021-01-20 DIAGNOSIS — Z20822 Contact with and (suspected) exposure to covid-19: Secondary | ICD-10-CM | POA: Diagnosis present

## 2021-01-20 DIAGNOSIS — H9190 Unspecified hearing loss, unspecified ear: Secondary | ICD-10-CM | POA: Diagnosis present

## 2021-01-20 DIAGNOSIS — Z888 Allergy status to other drugs, medicaments and biological substances status: Secondary | ICD-10-CM

## 2021-01-20 DIAGNOSIS — I5032 Chronic diastolic (congestive) heart failure: Secondary | ICD-10-CM | POA: Diagnosis present

## 2021-01-20 DIAGNOSIS — I493 Ventricular premature depolarization: Secondary | ICD-10-CM | POA: Diagnosis present

## 2021-01-20 DIAGNOSIS — D72829 Elevated white blood cell count, unspecified: Secondary | ICD-10-CM

## 2021-01-20 DIAGNOSIS — Z87442 Personal history of urinary calculi: Secondary | ICD-10-CM

## 2021-01-20 DIAGNOSIS — J45909 Unspecified asthma, uncomplicated: Secondary | ICD-10-CM | POA: Diagnosis present

## 2021-01-20 DIAGNOSIS — G9341 Metabolic encephalopathy: Secondary | ICD-10-CM | POA: Diagnosis present

## 2021-01-20 DIAGNOSIS — E876 Hypokalemia: Secondary | ICD-10-CM | POA: Diagnosis not present

## 2021-01-20 DIAGNOSIS — R410 Disorientation, unspecified: Secondary | ICD-10-CM

## 2021-01-20 DIAGNOSIS — K219 Gastro-esophageal reflux disease without esophagitis: Secondary | ICD-10-CM | POA: Diagnosis present

## 2021-01-20 DIAGNOSIS — Z66 Do not resuscitate: Secondary | ICD-10-CM | POA: Diagnosis present

## 2021-01-20 DIAGNOSIS — J189 Pneumonia, unspecified organism: Secondary | ICD-10-CM | POA: Diagnosis not present

## 2021-01-20 DIAGNOSIS — Z79899 Other long term (current) drug therapy: Secondary | ICD-10-CM

## 2021-01-20 DIAGNOSIS — Z7901 Long term (current) use of anticoagulants: Secondary | ICD-10-CM

## 2021-01-20 DIAGNOSIS — Z8673 Personal history of transient ischemic attack (TIA), and cerebral infarction without residual deficits: Secondary | ICD-10-CM

## 2021-01-20 DIAGNOSIS — J9601 Acute respiratory failure with hypoxia: Secondary | ICD-10-CM | POA: Diagnosis present

## 2021-01-20 DIAGNOSIS — M4802 Spinal stenosis, cervical region: Secondary | ICD-10-CM | POA: Diagnosis present

## 2021-01-20 DIAGNOSIS — Z993 Dependence on wheelchair: Secondary | ICD-10-CM

## 2021-01-20 DIAGNOSIS — I130 Hypertensive heart and chronic kidney disease with heart failure and stage 1 through stage 4 chronic kidney disease, or unspecified chronic kidney disease: Secondary | ICD-10-CM | POA: Diagnosis present

## 2021-01-20 DIAGNOSIS — E43 Unspecified severe protein-calorie malnutrition: Secondary | ICD-10-CM | POA: Diagnosis present

## 2021-01-20 DIAGNOSIS — N189 Chronic kidney disease, unspecified: Secondary | ICD-10-CM

## 2021-01-20 DIAGNOSIS — Y92121 Bathroom in nursing home as the place of occurrence of the external cause: Secondary | ICD-10-CM

## 2021-01-20 DIAGNOSIS — Z6825 Body mass index (BMI) 25.0-25.9, adult: Secondary | ICD-10-CM

## 2021-01-20 DIAGNOSIS — N179 Acute kidney failure, unspecified: Principal | ICD-10-CM | POA: Diagnosis present

## 2021-01-20 DIAGNOSIS — Z79891 Long term (current) use of opiate analgesic: Secondary | ICD-10-CM

## 2021-01-20 DIAGNOSIS — Z781 Physical restraint status: Secondary | ICD-10-CM

## 2021-01-20 DIAGNOSIS — R4182 Altered mental status, unspecified: Secondary | ICD-10-CM | POA: Diagnosis not present

## 2021-01-20 DIAGNOSIS — E739 Lactose intolerance, unspecified: Secondary | ICD-10-CM | POA: Diagnosis present

## 2021-01-20 DIAGNOSIS — N182 Chronic kidney disease, stage 2 (mild): Secondary | ICD-10-CM | POA: Diagnosis present

## 2021-01-20 DIAGNOSIS — W0110XA Fall on same level from slipping, tripping and stumbling with subsequent striking against unspecified object, initial encounter: Secondary | ICD-10-CM | POA: Diagnosis present

## 2021-01-20 DIAGNOSIS — I480 Paroxysmal atrial fibrillation: Secondary | ICD-10-CM | POA: Diagnosis not present

## 2021-01-20 DIAGNOSIS — Z881 Allergy status to other antibiotic agents status: Secondary | ICD-10-CM

## 2021-01-20 DIAGNOSIS — R0600 Dyspnea, unspecified: Secondary | ICD-10-CM

## 2021-01-20 DIAGNOSIS — I4891 Unspecified atrial fibrillation: Secondary | ICD-10-CM | POA: Diagnosis present

## 2021-01-20 LAB — URINALYSIS, ROUTINE W REFLEX MICROSCOPIC
Bilirubin Urine: NEGATIVE
Glucose, UA: NEGATIVE mg/dL
Hgb urine dipstick: NEGATIVE
Ketones, ur: NEGATIVE mg/dL
Leukocytes,Ua: NEGATIVE
Nitrite: NEGATIVE
Protein, ur: NEGATIVE mg/dL
Specific Gravity, Urine: 1.012 (ref 1.005–1.030)
pH: 5 (ref 5.0–8.0)

## 2021-01-20 LAB — CBC WITH DIFFERENTIAL/PLATELET
Abs Immature Granulocytes: 0.07 10*3/uL (ref 0.00–0.07)
Basophils Absolute: 0 10*3/uL (ref 0.0–0.1)
Basophils Relative: 0 %
Eosinophils Absolute: 0 10*3/uL (ref 0.0–0.5)
Eosinophils Relative: 0 %
HCT: 30.8 % — ABNORMAL LOW (ref 39.0–52.0)
Hemoglobin: 10 g/dL — ABNORMAL LOW (ref 13.0–17.0)
Immature Granulocytes: 1 %
Lymphocytes Relative: 13 %
Lymphs Abs: 1.5 10*3/uL (ref 0.7–4.0)
MCH: 30.6 pg (ref 26.0–34.0)
MCHC: 32.5 g/dL (ref 30.0–36.0)
MCV: 94.2 fL (ref 80.0–100.0)
Monocytes Absolute: 1.4 10*3/uL — ABNORMAL HIGH (ref 0.1–1.0)
Monocytes Relative: 12 %
Neutro Abs: 8.7 10*3/uL — ABNORMAL HIGH (ref 1.7–7.7)
Neutrophils Relative %: 74 %
Platelets: 256 10*3/uL (ref 150–400)
RBC: 3.27 MIL/uL — ABNORMAL LOW (ref 4.22–5.81)
RDW: 14.8 % (ref 11.5–15.5)
WBC: 11.7 10*3/uL — ABNORMAL HIGH (ref 4.0–10.5)
nRBC: 0 % (ref 0.0–0.2)

## 2021-01-20 LAB — COMPREHENSIVE METABOLIC PANEL
ALT: 23 U/L (ref 0–44)
AST: 37 U/L (ref 15–41)
Albumin: 2.7 g/dL — ABNORMAL LOW (ref 3.5–5.0)
Alkaline Phosphatase: 109 U/L (ref 38–126)
Anion gap: 13 (ref 5–15)
BUN: 31 mg/dL — ABNORMAL HIGH (ref 8–23)
CO2: 29 mmol/L (ref 22–32)
Calcium: 6 mg/dL — CL (ref 8.9–10.3)
Chloride: 96 mmol/L — ABNORMAL LOW (ref 98–111)
Creatinine, Ser: 2.04 mg/dL — ABNORMAL HIGH (ref 0.61–1.24)
GFR, Estimated: 29 mL/min — ABNORMAL LOW (ref >60)
Glucose, Bld: 124 mg/dL — ABNORMAL HIGH (ref 70–99)
Potassium: 2.6 mmol/L — CL (ref 3.5–5.1)
Sodium: 138 mmol/L (ref 135–145)
Total Bilirubin: 2 mg/dL — ABNORMAL HIGH (ref 0.3–1.2)
Total Protein: 6.5 g/dL (ref 6.5–8.1)

## 2021-01-20 LAB — BASIC METABOLIC PANEL
Anion gap: 14 (ref 5–15)
BUN: 29 mg/dL — ABNORMAL HIGH (ref 8–23)
CO2: 28 mmol/L (ref 22–32)
Calcium: 6.2 mg/dL — CL (ref 8.9–10.3)
Chloride: 97 mmol/L — ABNORMAL LOW (ref 98–111)
Creatinine, Ser: 1.96 mg/dL — ABNORMAL HIGH (ref 0.61–1.24)
GFR, Estimated: 31 mL/min — ABNORMAL LOW (ref >60)
Glucose, Bld: 128 mg/dL — ABNORMAL HIGH (ref 70–99)
Potassium: 2.8 mmol/L — ABNORMAL LOW (ref 3.5–5.1)
Sodium: 139 mmol/L (ref 135–145)

## 2021-01-20 LAB — MAGNESIUM: Magnesium: 0.6 mg/dL — CL (ref 1.7–2.4)

## 2021-01-20 LAB — RESP PANEL BY RT-PCR (FLU A&B, COVID) ARPGX2
Influenza A by PCR: NEGATIVE
Influenza B by PCR: NEGATIVE
SARS Coronavirus 2 by RT PCR: NEGATIVE

## 2021-01-20 MED ORDER — POTASSIUM CHLORIDE 10 MEQ/100ML IV SOLN
10.0000 meq | INTRAVENOUS | Status: AC
  Administered 2021-01-20 – 2021-01-21 (×3): 10 meq via INTRAVENOUS
  Filled 2021-01-20: qty 100

## 2021-01-20 MED ORDER — SODIUM CHLORIDE 0.9 % IV SOLN
INTRAVENOUS | Status: DC
Start: 2021-01-20 — End: 2021-01-21

## 2021-01-20 MED ORDER — POTASSIUM CHLORIDE CRYS ER 20 MEQ PO TBCR
40.0000 meq | EXTENDED_RELEASE_TABLET | Freq: Once | ORAL | Status: AC
  Administered 2021-01-20: 40 meq via ORAL
  Filled 2021-01-20: qty 2

## 2021-01-20 MED ORDER — SODIUM CHLORIDE 0.9 % IV BOLUS
1000.0000 mL | Freq: Once | INTRAVENOUS | Status: AC
  Administered 2021-01-20: 1000 mL via INTRAVENOUS

## 2021-01-20 MED ORDER — CALCIUM GLUCONATE-NACL 1-0.675 GM/50ML-% IV SOLN
1.0000 g | Freq: Once | INTRAVENOUS | Status: AC
  Administered 2021-01-20: 1000 mg via INTRAVENOUS
  Filled 2021-01-20: qty 50

## 2021-01-20 NOTE — ED Notes (Signed)
MD notified of K and CA results.

## 2021-01-20 NOTE — ED Triage Notes (Signed)
Pt BIB [facility] from [facility] [place] d/t witnessed fall in bathroom, did hit head, no LOC, on Eliquis, A/Ox4, denies pain. EMS reports his pupils are unequal at baseline.

## 2021-01-20 NOTE — ED Notes (Signed)
Patient transported to CT 

## 2021-01-20 NOTE — H&P (Addendum)
History and Physical    Cody Mendoza CVE:[id number] DOB: 1921-09-24 DOA: 01/20/2021  PCP: Crist Infante, MD  Patient coming from: [facility]   I have personally briefly reviewed patient's old medical records in [place]  Chief Complaint: Fall  HPI: Cody Mendoza is a 87 y.o. male with medical history significant for spinal stenosis wheelchair bound, on hospice, paroxysmal atrial fibrillation on Eliquis, CVA, chronic diastolic heart failure, CKD stage II, and GERD who presents following a fall.  Patient unable to provide history due to hearing impairment and confusion.  Spoke with son over the phone who mentions that patient has been having about 10 days worth of diarrhea.  Initially given Imodium and another medication by hospice with improving symptoms.  Also has nausea but not sure if he has been vomiting.  He has been having a lack of energy.  Today he was transferring from the toilet to his wheelchair and slipped out hitting one of his ears and was brought to the ED. Patient also has history of spinal stenosis and is wheelchair-bound.  Has been noting more pain to his back recently and on morphine more often this week.  ED Course: He was afebrile normotensive and mildly tachycardic up to 112 and atrial fibrillation. WBC of 11.7, hemoglobin of 10, K of 2.6, chloride of 96, creatinine of 2.04 with remote baseline around 1.3.  BG of 124.  Calcium at 6 with albumin of 2.7. EKG in atrial fibrillation with frequent PVCs. CT head and cervical spine was negative. Chest x-ray shows left lung base opacity possible pneumonia.  Patient was given 1 g of IV calcium gluconate, 10 mEq x 3 of potassium and 1 L of normal saline in the ED.  Hospitalist on-call for admission.  Review of Systems: Unable to obtain since patient unable to provide history  Past Medical History:  Diagnosis Date   AF (atrial fibrillation) (HCC)    Asthma    Detached retina    Discitis of thoracic  region 08/2018   Diverticulosis    GERD (gastroesophageal reflux disease)    Rosanna Randy syndrome    Hypertension    Lactose intolerance    Mitral insufficiency    Renal calculi    TIA (transient ischemic attack)     Past Surgical History:  Procedure Laterality Date   cataract surgery     ESOPHAGEAL DILATION     In the past   [place]     Status post syrgical removal of a    TONSILLECTOMY       reports that he has never smoked. He has never used smokeless tobacco. He reports current alcohol use. He reports that he does not use drugs. Social History  Allergies  Allergen Reactions   Amlodipine     Other reaction(s): edema   Daptomycin     Other reaction(s): caused eosinophillic pna   Donepezil Hcl     Other reaction(s): insomnia, n/v   Lactose     Other reaction(s): diarrhea/gas   Thiazide-Type Diuretics     Other reaction(s): too much hyponatremia    Family History  Problem Relation Age of Onset   Hypertension Mother      Prior to Admission medications   Medication Sig Start Date End Date Taking? Authorizing Provider  amLODipine (NORVASC) 5 MG tablet Take 1 tablet (5 mg total) by mouth daily. Patient taking differently: Take 5  mg by mouth every evening.  02/21/19 08/10/19  Curatolo, Adam, DO  apixaban (ELIQUIS) 5 MG TABS tablet Take 1 tablet (5 mg total) by mouth 2 (two) times daily. 09/17/18   [place], Peter M, MD  carvedilol (COREG) 3.125 MG tablet Take by mouth.    [provider]  Cyanocobalamin (VITAMIN B12) 500 MCG TABS Take 500 mcg by mouth every morning.     [provider]  doxazosin (CARDURA) 4 MG tablet Take 0.5 tablets (2 mg total) by mouth daily. 08/28/18   Hongalgi, Lenis Dickinson, MD  doxycycline (VIBRA-TABS) 100 MG tablet Take 100 mg by mouth 2 (two) times daily. 12/06/19   [provider]  furosemide (LASIX) 20 MG tablet Take 1 tablet (20 mg total) by mouth daily. Patient  taking differently: Take 40 mg by mouth daily.  08/17/19 09/16/19  Antonieta Pert, MD  ipratropium-albuterol (DUONEB) 0.5-2.5 (3) MG/3ML SOLN Take by nebulization. 05/28/20   [provider]  Lactobacillus (PROBIOTIC ACIDOPHILUS PO) Take 1 capsule by mouth daily with supper.     [provider]  latanoprost (XALATAN) 0.005 % ophthalmic solution Place 1 drop into both eyes at bedtime.  09/05/17   [provider]  levalbuterol Penne Lash) 0.63 MG/3ML nebulizer solution Take 3 mLs (0.63 mg total) by nebulization every 6 (six) hours as needed for wheezing or shortness of breath. 07/07/19   Mariel Aloe, MD  losartan (COZAAR) 100 MG tablet Take by mouth.    [provider]  metoprolol succinate (TOPROL-XL) 50 MG 24 hr tablet Take 50 mg by mouth daily. 11/20/19   [provider]  mirtazapine (REMERON) 15 MG tablet Take 15 mg by mouth at bedtime. 08/09/19   [provider]  Morphine Sulfate (MORPHINE CONCENTRATE) 10 mg / 0.5 ml concentrated solution Take by mouth. 08/28/19   [provider]  Multiple Vitamin (MULTI-VITAMIN) tablet Take by mouth.    [provider]  Multiple Vitamin (MULTIVITAMIN WITH MINERALS) TABS Take 1 tablet by mouth daily with supper.     [provider]  Multiple Vitamins-Minerals (ICAPS AREDS FORMULA PO) Take 1 capsule by mouth 2 (two) times daily with breakfast and lunch.     [provider]  pantoprazole (PROTONIX) 40 MG tablet Take 40 mg by mouth every morning.    [provider]  Polyethyl Glycol-Propyl Glycol 0.4-0.3 % SOLN Place 1 drop into both eyes at bedtime.     [provider]    Physical Exam: Vitals:   01/20/21 1915 01/20/21 2000 01/20/21 2015 01/20/21 2030  BP: 137/84 127/78 127/74 119/73  Pulse: 96 99 90 100  Resp: 15 (!) 21 (!) 29 (!) 32  Temp:      TempSrc:      SpO2: 96% 97% 95% 100%  Weight:      Height:        Constitutional: NAD, calm, comfortable, elderly  chronically ill-appearing gentleman laying approximately 40 degree incline in bed Vitals:   01/20/21 1915 01/20/21 2000 01/20/21 2015 01/20/21 2030  BP: 137/84 127/78 127/74 119/73  Pulse: 96 99 90 100  Resp: 15 (!) 21 (!) 29 (!) 32  Temp:      TempSrc:      SpO2: 96% 97% 95% 100%  Weight:      Height:       Eyes: PERRL, lids and conjunctivae normal ENMT: Mucous membranes are moist.  Has left-sided hearing aid Neck: normal, supple Respiratory: clear to auscultation bilaterally, no wheezing, no crackles. Normal  respiratory effort. No accessory muscle use.  Cardiovascular: Regular rate and rhythm, no murmurs / rubs / gallops.  Nonpitting edema bilateral lower extremity up to be tibial region.   Abdomen: no tenderness, no masses palpated. Bowel sounds positive.  Musculoskeletal: no clubbing / cyanosis. No joint deformity upper and lower extremities. Normal muscle tone.  Skin: no rashes, lesions, ulcers. No induration Neurologic: CN 2-12 grossly intact.  Patient unable to hear due to hearing impairment.  Also appears confused when questions were written down on paper. Psychiatric: appears confused   Labs on Admission: I have personally reviewed following labs and imaging studies  CBC: Recent Labs  Lab 01/20/21 1804  WBC 11.7*  NEUTROABS 8.7*  HGB 10.0*  HCT 30.8*  MCV 94.2  PLT 101   Basic Metabolic Panel: Recent Labs  Lab 01/20/21 1804  NA 138  K 2.6*  CL 96*  CO2 29  GLUCOSE 124*  BUN 31*  CREATININE 2.04*  CALCIUM 6.0*   GFR: Estimated Creatinine Clearance: 19.4 mL/min (A) (by C-G formula based on SCr of 2.04 mg/dL (H)). Liver Function Tests: Recent Labs  Lab 01/20/21 1804  AST 37  ALT 23  ALKPHOS 109  BILITOT 2.0*  PROT 6.5  ALBUMIN 2.7*   No results for input(s): LIPASE, AMYLASE in the last 168 hours. No results for input(s): AMMONIA in the last 168 hours. Coagulation Profile: No results for input(s): INR, PROTIME in the last 168 hours. Cardiac  Enzymes: No results for input(s): CKTOTAL, CKMB, CKMBINDEX, TROPONINI in the last 168 hours. BNP (last 3 results) No results for input(s): PROBNP in the last 8760 hours. HbA1C: No results for input(s): HGBA1C in the last 72 hours. CBG: No results for input(s): GLUCAP in the last 168 hours. Lipid Profile: No results for input(s): CHOL, HDL, LDLCALC, TRIG, CHOLHDL, LDLDIRECT in the last 72 hours. Thyroid Function Tests: No results for input(s): TSH, T4TOTAL, FREET4, T3FREE, THYROIDAB in the last 72 hours. Anemia Panel: No results for input(s): VITAMINB12, FOLATE, FERRITIN, TIBC, IRON, RETICCTPCT in the last 72 hours. Urine analysis:    Component Value Date/Time   COLORURINE YELLOW 01/20/2021 1958   APPEARANCEUR CLEAR 01/20/2021 1958   LABSPEC 1.012 01/20/2021 1958   PHURINE 5.0 01/20/2021 1958   GLUCOSEU NEGATIVE 01/20/2021 1958   HGBUR NEGATIVE 01/20/2021 1958   BILIRUBINUR NEGATIVE 01/20/2021 1958   KETONESUR NEGATIVE 01/20/2021 1958   PROTEINUR NEGATIVE 01/20/2021 1958   UROBILINOGEN 1.0 09/21/2014 1610   NITRITE NEGATIVE 01/20/2021 1958   LEUKOCYTESUR NEGATIVE 01/20/2021 1958    Radiological Exams on Admission: CT Head Wo Contrast  Result Date: 01/20/2021 CLINICAL DATA:  Fall.  Head injury EXAM: CT HEAD WITHOUT CONTRAST CT CERVICAL SPINE WITHOUT CONTRAST TECHNIQUE: Multidetector CT imaging of the head and cervical spine was performed following the standard protocol without intravenous contrast. Multiplanar CT image reconstructions of the cervical spine were also generated. COMPARISON:  CT head 06/08/2018 FINDINGS: CT HEAD FINDINGS Brain: Negative for acute hemorrhage. Negative for acute infarct or mass Moderate atrophy and moderate chronic microvascular ischemic change, similar to the prior study. Vascular: Negative for hyperdense vessel Skull: Negative Sinuses/Orbits: Mild mucosal edema paranasal sinuses. Bilateral cataract extraction Other: None CT CERVICAL SPINE FINDINGS  Alignment: Mild anterolisthesis C5-6 Skull base and vertebrae: Negative for fracture Soft tissues and spinal canal: No acute abnormality. Atherosclerotic calcification. Disc levels: Disc degeneration and facet degeneration throughout the cervical spine. Foraminal stenosis bilaterally due to spurring. Upper chest: Lung apices clear bilaterally Other: None IMPRESSION: 1. Atrophy and  chronic microvascular ischemic change. No acute intracranial abnormality. 2. Negative for cervical spine fracture.  Multilevel spondylosis. Electronically Signed   By: Franchot Gallo M.D.   On: 01/20/2021 18:50   CT Cervical Spine Wo Contrast  Result Date: 01/20/2021 CLINICAL DATA:  Fall.  Head injury EXAM: CT HEAD WITHOUT CONTRAST CT CERVICAL SPINE WITHOUT CONTRAST TECHNIQUE: Multidetector CT imaging of the head and cervical spine was performed following the standard protocol without intravenous contrast. Multiplanar CT image reconstructions of the cervical spine were also generated. COMPARISON:  CT head 06/08/2018 FINDINGS: CT HEAD FINDINGS Brain: Negative for acute hemorrhage. Negative for acute infarct or mass Moderate atrophy and moderate chronic microvascular ischemic change, similar to the prior study. Vascular: Negative for hyperdense vessel Skull: Negative Sinuses/Orbits: Mild mucosal edema paranasal sinuses. Bilateral cataract extraction Other: None CT CERVICAL SPINE FINDINGS Alignment: Mild anterolisthesis C5-6 Skull base and vertebrae: Negative for fracture Soft tissues and spinal canal: No acute abnormality. Atherosclerotic calcification. Disc levels: Disc degeneration and facet degeneration throughout the cervical spine. Foraminal stenosis bilaterally due to spurring. Upper chest: Lung apices clear bilaterally Other: None IMPRESSION: 1. Atrophy and chronic microvascular ischemic change. No acute intracranial abnormality. 2. Negative for cervical spine fracture.  Multilevel spondylosis. Electronically Signed   By: Franchot Gallo M.D.   On: 01/20/2021 18:50   DG Chest Port 1 View  Result Date: 01/20/2021 CLINICAL DATA:  Fatigue.  Fall. EXAM: PORTABLE CHEST 1 VIEW COMPARISON:  Chest radiograph 08/15/2019 CT 08/23/2018 FINDINGS: Lung volumes are low. Stable cardiomegaly. Aortic atherosclerosis. Left lung base opacity is greatest in the periphery. Mild chronic interstitial coarsening. No pneumothorax. No significant pleural effusion. No acute osseous abnormalities are seen. IMPRESSION: 1. Peripheral opacity at the left lung base. This may represent pneumonia in the appropriate clinical setting. Appearance is unusual for pulmonary contusion 2. Stable cardiomegaly. Aortic Atherosclerosis (ICD10-I70.0). Electronically Signed   By: Keith Rake M.D.   On: 01/20/2021 18:14      Assessment/Plan  Fall secondary to weakness Hypokalemia Hypocalcemia Recent history of diarrhea-check GI stool panel Secondary to hypokalemia 2.4, hypocalcemia-corrected calcium of 6.6 -Repleted with IV K 10 mEq x 3, IV 1 g calcium gluconate -Check magnesium and replete as needed -repeat BMP now and in the morning  Acute metabolic encephalopathy Likely secondary to electrolyte derangement Son denies any history of dementia and states other than hearing difficulties he is otherwise able to carry full conversation.  However he appears confused here and cannot did not appear to comprehend questions written on paper for him. -Continue to monitor while getting repletion of electrolytes -pt later required 0.5mg  of IV ativan and soft restraints due to worsening confusion and attempting to get out of bed  AKI on CKD stage II - Creatinine elevated 2.04 with remote baseline around 1.3 -Has received 1 L normal saline in the ED.  We will continue low-dose gentle IV fluid hydration overnight.  Leukocytosis - WBC 11.4.  Possibly reactive but chest x-ray does note a possible left lung base pneumonia.  He is stable on room air.  Check procalcitonin  before initiating any antibiotics  Paroxysmal atrial fibrillation - Continue Eliquis, beta blocker  Chronic diastolic heart failure - Has noted lower extremity edema with compression stockings on exam -Last echocardiogram in 06/2019 with EF 55 to 60%  DVT prophylaxis:.Eliquis  code Status: DNR -confirmed with son over the phone and he also has paperwork at bedside Family Communication: Plan discussed with patient at bedside  disposition Plan: ALF with observation  Consults called:  Admission status: Observation    Level of care: Telemetry Medical  Status is: Observation  The patient remains OBS appropriate and will d/c before 2 midnights.        Orene Desanctis DO [facility]   If 7PM-7AM, please contact night-coverage [email or web address]   01/20/2021, 10:03 PM

## 2021-01-20 NOTE — Progress Notes (Signed)
Orthopedic Tech Progress Note Patient Details:  Cody Mendoza [birth year] [id number]  Level 2 trauma   Patient ID: Cody Mendoza, male   DOB: 08/21/1921, 87 y.o.   MRN: [id number]  Janit Pagan 01/20/2021, 6:10 PM

## 2021-01-20 NOTE — ED Provider Notes (Signed)
[facility] EMERGENCY DEPARTMENT Provider Note   CSN: [id number] Arrival date & time: 01/20/21  1759     History Chief Complaint  Patient presents with   Level II   Fall on thinners    CHRISTOPER BUSHEY is a 87 y.o. male.  87 yo M with a cc of a fall. Reportedly struck his head. Thought to be weak over the past couple days. Patient denies any complaint.  Denies headache neck pain chest pain abdominal pain.  Tells me he has been eating and drinking normally denies cough congestion or fever.  The history is provided by the patient.  Illness Severity:  Moderate Onset quality:  Gradual Duration:  2 hours Timing:  Constant Progression:  Worsening Chronicity:  New Associated symptoms: no abdominal pain, no chest pain, no congestion, no diarrhea, no fever, no headaches, no myalgias, no rash, no shortness of breath and no vomiting       Past Medical History:  Diagnosis Date   AF (atrial fibrillation) (HCC)    Asthma    Detached retina    Discitis of thoracic region 08/2018   Diverticulosis    GERD (gastroesophageal reflux disease)    Rosanna Randy syndrome    Hypertension    Lactose intolerance    Mitral insufficiency    Renal calculi    TIA (transient ischemic attack)     Patient Active Problem List   Diagnosis Date Noted   Hypocalcemia 01/20/2021   Acute kidney injury superimposed on chronic kidney disease ([place]) 01/20/2021   Leukocytosis 01/20/2021   Hypertensive heart failure ([place]) 09/23/2019   Localized edema 09/23/2019   Chronic diastolic heart failure ([place]) 08/26/2019   Goals of care, counseling/discussion    DNR (do not resuscitate)    Palliative care by specialist    Hypokalemia 08/11/2019   Acute exacerbation of congestive heart failure ([place]) 08/10/2019   Acute CHF (congestive heart failure) ([place]) 06/26/2019   Excessive cerumen in both ear canals 06/10/2019   Sensorineural hearing loss (SNHL), bilateral 06/10/2019   Gross hematuria  05/07/2019   Thrombophilia ([place]) 05/02/2019   Bilateral back pain 02/11/2019   Lumbar spondylosis 02/11/2019   Disorder of musculoskeletal system 01/24/2019   Pain in thoracic spine 01/24/2019   Tinea unguium 10/29/2018   Lower extremity edema 09/13/2018   Chronic osteomyelitis ([place]) 09/05/2018   Diarrhea 09/05/2018   Near syncope 08/28/2018   Hyponatremia 08/28/2018   TIA (transient ischemic attack) 08/27/2018   Discitis of thoracic region 08/23/2018   CKD (chronic kidney disease) stage 2, GFR 60-89 ml/min 08/23/2018   Chronic anemia 08/23/2018   Discitis 08/23/2018   Cough 12/18/2017   Pneumonia 12/18/2017   Acute upper respiratory infection 12/06/2017   Abnormal gait 11/10/2017   Elevated PSA 11/10/2017   Actinic keratosis 09/18/2017   Overactive bladder 06/16/2017   Lactose intolerance 01/25/2017   Asymptomatic microscopic hematuria 09/30/2016   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   Hypo-osmolality and hyponatremia 09/30/2016   Disequilibrium 11/13/2015   Abnormal results of thyroid function studies 07/03/2014   Hearing loss 05/28/2014   Puncture wound of right lower leg without foreign body 10/08/2013   Flatulence, eructation and gas pain 03/03/2013   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   Benign prostatic hyperplasia without lower urinary tract symptoms 05/16/2012   Proteinuria 06/01/2011   Pain in unspecified knee 05/02/2011   Benign paroxysmal positional vertigo 01/29/2011   Mitral insufficiency 12/28/2010   Atrial fibrillation ([place]) 11/16/2010   Hypertension    Testicular hypofunction 07/22/2010  GERD 04/22/2010   PERSONAL HX COLONIC POLYPS 04/22/2010   Bradycardia 03/15/2010   Asthma without status asthmaticus 02/25/2009   ED (erectile dysfunction) of organic origin 02/25/2009   Radiculopathy 02/25/2009    Past Surgical History:  Procedure Laterality Date   cataract surgery     ESOPHAGEAL DILATION     In the past   [place]     Status post syrgical removal of a    TONSILLECTOMY         Family History  Problem Relation Age of Onset   Hypertension Mother     Social History   Tobacco Use   Smoking status: Never   Smokeless tobacco: Never  Vaping Use   Vaping Use: Never used  Substance Use Topics   Alcohol use: Yes    Alcohol/week: 0.0 standard drinks    Comment: rarely   Drug use: Never    Home Medications Prior to Admission medications   Medication Sig Start Date End Date Taking? Authorizing Provider  amLODipine (NORVASC) 5 MG tablet Take 1 tablet (5 mg total) by mouth daily. Patient taking differently: Take 5 mg by mouth every evening. 02/21/19 01/21/21 Yes Curatolo, Adam, DO  apixaban (ELIQUIS) 5 MG TABS tablet Take 1 tablet (5 mg total) by mouth 2 (two) times daily. 09/17/18  Yes Martinique, Peter M, MD  Cyanocobalamin (VITAMIN B12) 500 MCG TABS Take 500 mcg by mouth every morning.    Yes [provider]  doxazosin (CARDURA) 4 MG tablet Take 0.5 tablets (2 mg total) by mouth daily. 08/28/18  Yes Hongalgi, Lenis Dickinson, MD  doxycycline (VIBRA-TABS) 100 MG tablet Take 100 mg by mouth 2 (two) times daily. 12/06/19  Yes [provider]  furosemide (LASIX) 20 MG tablet Take 1 tablet (20 mg total) by mouth daily. Patient taking differently: Take 40 mg by mouth daily. 08/17/19 01/21/21 Yes Kc, Maren Beach, MD  ipratropium-albuterol (DUONEB) 0.5-2.5 (3) MG/3ML SOLN Take 3 mLs by nebulization every 6 (six) hours as needed (shortness of breath). 05/28/20  Yes [provider]  Lactobacillus (PROBIOTIC ACIDOPHILUS PO) Take 1 capsule by mouth daily with supper.    Yes [provider]  metoprolol succinate (TOPROL-XL) 50 MG 24 hr tablet Take 50 mg by mouth daily. 11/20/19  Yes [provider]  mirtazapine (REMERON) 15 MG tablet Take 15 mg by mouth at bedtime. 08/09/19  Yes [provider]  Multiple Vitamin (MULTI-VITAMIN) tablet Take 1  tablet by mouth daily.   Yes [provider]  Multiple Vitamins-Minerals (ICAPS AREDS FORMULA PO) Take 1 capsule by mouth 2 (two) times daily with breakfast and lunch.    Yes [provider]  pantoprazole (PROTONIX) 40 MG tablet Take 40 mg by mouth every morning.   Yes [provider]  levalbuterol (XOPENEX) 0.63 MG/3ML nebulizer solution Take 3 mLs (0.63 mg total) by nebulization every 6 (six) hours as needed for wheezing or shortness of breath. 07/07/19   Mariel Aloe, MD    Allergies    Amlodipine, Daptomycin, Donepezil hcl, Lactose, and Thiazide-type diuretics  Review of Systems   Review of Systems  Constitutional:  Negative for chills and fever.  HENT:  Negative for congestion and facial swelling.   Eyes:  Negative for discharge and visual disturbance.  Respiratory:  Negative for shortness of breath.   Cardiovascular:  Negative for chest pain and palpitations.  Gastrointestinal:  Negative for abdominal  pain, diarrhea and vomiting.  Musculoskeletal:  Negative for arthralgias and myalgias.  Skin:  Negative for color change and rash.  Neurological:  Negative for tremors, syncope and headaches.  Psychiatric/Behavioral:  Negative for confusion and dysphoric mood.    Physical Exam Updated Vital Signs BP 121/68 (BP Location: Left Arm)   Pulse 99   Temp 99 F (37.2 C) (Axillary)   Resp 16   Ht 5\' 7"  (1.702 m)   Wt 74.5 kg   SpO2 98%   BMI 25.72 kg/m   Physical Exam Vitals and nursing note reviewed.  Constitutional:      Appearance: He is well-developed.  HENT:     Head: Normocephalic and atraumatic.  Eyes:     Pupils: Pupils are equal, round, and reactive to light.  Neck:     Vascular: No JVD.  Cardiovascular:     Rate and Rhythm: Normal rate and regular rhythm.     Heart sounds: No murmur heard.   No friction rub. No gallop.  Pulmonary:     Effort: No respiratory distress.     Breath sounds: No wheezing.  Abdominal:     General: There is  no distension.     Tenderness: There is no abdominal tenderness. There is no guarding or rebound.  Musculoskeletal:        General: Normal range of motion.     Cervical back: Normal range of motion and neck supple.  Skin:    Coloration: Skin is not pale.     Findings: No rash.  Neurological:     Mental Status: He is alert and oriented to person, place, and time.  Psychiatric:        Behavior: Behavior normal.    ED Results / Procedures / Treatments   Labs (all labs ordered are listed, but only abnormal results are displayed) Labs Reviewed  CBC WITH DIFFERENTIAL/PLATELET - Abnormal; Notable for the following components:      Result Value   WBC 11.7 (*)    RBC 3.27 (*)    Hemoglobin 10.0 (*)    HCT 30.8 (*)    Neutro Abs 8.7 (*)    Monocytes Absolute 1.4 (*)    All other components within normal limits  COMPREHENSIVE METABOLIC PANEL - Abnormal; Notable for the following components:   Potassium 2.6 (*)    Chloride 96 (*)    Glucose, Bld 124 (*)    BUN 31 (*)    Creatinine, Ser 2.04 (*)    Calcium 6.0 (*)    Albumin 2.7 (*)    Total Bilirubin 2.0 (*)    GFR, Estimated 29 (*)    All other components within normal limits  MAGNESIUM - Abnormal; Notable for the following components:   Magnesium 0.6 (*)    All other components within normal limits  BASIC METABOLIC PANEL - Abnormal; Notable for the following components:   Potassium 2.8 (*)    Chloride 97 (*)    Glucose, Bld 128 (*)    BUN 29 (*)    Creatinine, Ser 1.96 (*)    Calcium 6.2 (*)    GFR, Estimated 31 (*)    All other components within normal limits  BASIC METABOLIC PANEL - Abnormal; Notable for the following components:   Glucose, Bld 118 (*)    BUN 28 (*)    Creatinine, Ser 1.80 (*)    Calcium 6.1 (*)    GFR, Estimated 34 (*)    All other components within normal limits  MAGNESIUM - Abnormal; Notable for the following components:   Magnesium 1.1 (*)    All other components within normal limits  RESP  PANEL BY RT-PCR (FLU A&B, COVID) [id number]  GASTROINTESTINAL PANEL BY PCR, STOOL (REPLACES STOOL CULTURE)  MRSA NEXT GEN BY PCR, NASAL  CULTURE, BLOOD (ROUTINE X 2)  CULTURE, BLOOD (ROUTINE X 2)  URINALYSIS, ROUTINE W REFLEX MICROSCOPIC  PROCALCITONIN  STREP PNEUMONIAE URINARY ANTIGEN  LEGIONELLA PNEUMOPHILA SEROGP 1 UR AG    EKG EKG Interpretation  Date/Time:  Wednesday January 20 2021 18:09:47 EST Ventricular Rate:  89 PR Interval:    QRS Duration: 80 QT Interval:  383 QTC Calculation: 466 R Axis:   -40 Text Interpretation: Atrial fibrillation Ventricular premature complex Inferior infarct, old Anteroseptal infarct, old Otherwise no significant change Confirmed by Deno Etienne [id number]) on 01/20/2021 6:20:30 PM  Radiology CT Head Wo Contrast  Result Date: 01/20/2021 CLINICAL DATA:  Fall.  Head injury EXAM: CT HEAD WITHOUT CONTRAST CT CERVICAL SPINE WITHOUT CONTRAST TECHNIQUE: Multidetector CT imaging of the head and cervical spine was performed following the standard protocol without intravenous contrast. Multiplanar CT image reconstructions of the cervical spine were also generated. COMPARISON:  CT head 06/08/2018 FINDINGS: CT HEAD FINDINGS Brain: Negative for acute hemorrhage. Negative for acute infarct or mass Moderate atrophy and moderate chronic microvascular ischemic change, similar to the prior study. Vascular: Negative for hyperdense vessel Skull: Negative Sinuses/Orbits: Mild mucosal edema paranasal sinuses. Bilateral cataract extraction Other: None CT CERVICAL SPINE FINDINGS Alignment: Mild anterolisthesis C5-6 Skull base and vertebrae: Negative for fracture Soft tissues and spinal canal: No acute abnormality. Atherosclerotic calcification. Disc levels: Disc degeneration and facet degeneration throughout the cervical spine. Foraminal stenosis bilaterally due to spurring. Upper chest: Lung apices clear bilaterally Other: None IMPRESSION: 1. Atrophy and chronic microvascular ischemic  change. No acute intracranial abnormality. 2. Negative for cervical spine fracture.  Multilevel spondylosis. Electronically Signed   By: Franchot Gallo M.D.   On: 01/20/2021 18:50   CT Cervical Spine Wo Contrast  Result Date: 01/20/2021 CLINICAL DATA:  Fall.  Head injury EXAM: CT HEAD WITHOUT CONTRAST CT CERVICAL SPINE WITHOUT CONTRAST TECHNIQUE: Multidetector CT imaging of the head and cervical spine was performed following the standard protocol without intravenous contrast. Multiplanar CT image reconstructions of the cervical spine were also generated. COMPARISON:  CT head 06/08/2018 FINDINGS: CT HEAD FINDINGS Brain: Negative for acute hemorrhage. Negative for acute infarct or mass Moderate atrophy and moderate chronic microvascular ischemic change, similar to the prior study. Vascular: Negative for hyperdense vessel Skull: Negative Sinuses/Orbits: Mild mucosal edema paranasal sinuses. Bilateral cataract extraction Other: None CT CERVICAL SPINE FINDINGS Alignment: Mild anterolisthesis C5-6 Skull base and vertebrae: Negative for fracture Soft tissues and spinal canal: No acute abnormality. Atherosclerotic calcification. Disc levels: Disc degeneration and facet degeneration throughout the cervical spine. Foraminal stenosis bilaterally due to spurring. Upper chest: Lung apices clear bilaterally Other: None IMPRESSION: 1. Atrophy and chronic microvascular ischemic change. No acute intracranial abnormality. 2. Negative for cervical spine fracture.  Multilevel spondylosis. Electronically Signed   By: Franchot Gallo M.D.   On: 01/20/2021 18:50   DG Chest Port 1 View  Result Date: 01/20/2021 CLINICAL DATA:  Fatigue.  Fall. EXAM: PORTABLE CHEST 1 VIEW COMPARISON:  Chest radiograph 08/15/2019 CT 08/23/2018 FINDINGS: Lung volumes are low. Stable cardiomegaly. Aortic atherosclerosis. Left lung base opacity is greatest in the periphery. Mild chronic interstitial coarsening. No pneumothorax. No significant pleural  effusion. No acute osseous abnormalities are seen. IMPRESSION: 1. Peripheral  opacity at the left lung base. This may represent pneumonia in the appropriate clinical setting. Appearance is unusual for pulmonary contusion 2. Stable cardiomegaly. Aortic Atherosclerosis (ICD10-I70.0). Electronically Signed   By: Keith Rake M.D.   On: 01/20/2021 18:14    Procedures Procedures   Medications Ordered in ED Medications  apixaban (ELIQUIS) tablet 2.5 mg (2.5 mg Oral Not Given 01/21/21 0249)  metoprolol tartrate (LOPRESSOR) injection 2.5 mg (2.5 mg Intravenous Given 01/21/21 1215)  acetaminophen (TYLENOL) suppository 650 mg (650 mg Rectal Given 01/21/21 0707)  haloperidol lactate (HALDOL) injection 1-2 mg (has no administration in time range)  morphine 2 MG/ML injection 2 mg (has no administration in time range)  0.9 % NaCl with KCl 40 mEq / L  infusion ( Intravenous New Bag/Given 01/21/21 1212)  cefTRIAXone (ROCEPHIN) 1 g in sodium chloride 0.9 % 100 mL IVPB (1 g Intravenous New Bag/Given 01/21/21 1223)  azithromycin (ZITHROMAX) 500 mg in sodium chloride 0.9 % 250 mL IVPB (500 mg Intravenous New Bag/Given 01/21/21 1454)  metoprolol succinate (TOPROL-XL) 24 hr tablet 50 mg (has no administration in time range)  feeding supplement (ENSURE ENLIVE / ENSURE PLUS) liquid 237 mL (has no administration in time range)  multivitamin with minerals tablet 1 tablet (has no administration in time range)  calcium gluconate 1 g/ 50 mL sodium chloride IVPB (0 mg Intravenous Stopped 01/21/21 0005)  potassium chloride SA (KLOR-CON) CR tablet 40 mEq (40 mEq Oral Given 01/20/21 2120)  potassium chloride 10 mEq in 100 mL IVPB (0 mEq Intravenous Stopped 01/21/21 0110)  sodium chloride 0.9 % bolus 1,000 mL (0 mLs Intravenous Stopped 01/21/21 0009)  LORazepam (ATIVAN) injection 0.5 mg (0.5 mg Intravenous Given 01/21/21 0044)  magnesium sulfate IVPB 2 g 50 mL (2 g Intravenous New Bag/Given 01/21/21 0257)  metoprolol  tartrate (LOPRESSOR) injection 2.5 mg (2.5 mg Intravenous Given 01/21/21 0246)  magnesium sulfate IVPB 2 g 50 mL (2 g Intravenous New Bag/Given 01/21/21 0412)  calcium gluconate 1 g/ 50 mL sodium chloride IVPB (1,000 mg Intravenous New Bag/Given 01/21/21 1018)    ED Course  I have reviewed the triage vital signs and the nursing notes.  Pertinent labs & imaging results that were available during my care of the patient were reviewed by me and considered in my medical decision making (see chart for details).    MDM Rules/Calculators/A&P                           87 yo M reportedly had been more fatigued than normal over the past couple days.  He had a witnessed fall in the bathroom today and struck his head on the step leading up into the shower.  He denies any complaints.  Arrived as a level 2 trauma as he is on Eliquis and struck his head.  No obvious signs of trauma on initial exam.  Will obtain a CT of the head and C-spine.  Basic blood work with reported fatigue over the past couple days.  Reassess.  K 2.6, Ca 6.  AKI.  Discuss with medicine for admission.    CRITICAL CARE Performed by: Cecilio Asper   Total critical care time: 35 minutes  Critical care time was exclusive of separately billable procedures and treating other patients.  Critical care was necessary to treat or prevent imminent or life-threatening deterioration.  Critical care was time spent personally by me on the following activities: development of treatment plan with patient and/or  surrogate as well as nursing, discussions with consultants, evaluation of patient's response to treatment, examination of patient, obtaining history from patient or surrogate, ordering and performing treatments and interventions, ordering and review of laboratory studies, ordering and review of radiographic studies, pulse oximetry and re-evaluation of patient's condition.  The patients results and plan were reviewed and discussed.    Any x-rays performed were independently reviewed by myself.   Differential diagnosis were considered with the presenting HPI.  Medications  apixaban (ELIQUIS) tablet 2.5 mg (2.5 mg Oral Not Given 01/21/21 0249)  metoprolol tartrate (LOPRESSOR) injection 2.5 mg (2.5 mg Intravenous Given 01/21/21 1215)  acetaminophen (TYLENOL) suppository 650 mg (650 mg Rectal Given 01/21/21 0707)  haloperidol lactate (HALDOL) injection 1-2 mg (has no administration in time range)  morphine 2 MG/ML injection 2 mg (has no administration in time range)  0.9 % NaCl with KCl 40 mEq / L  infusion ( Intravenous New Bag/Given 01/21/21 1212)  cefTRIAXone (ROCEPHIN) 1 g in sodium chloride 0.9 % 100 mL IVPB (1 g Intravenous New Bag/Given 01/21/21 1223)  azithromycin (ZITHROMAX) 500 mg in sodium chloride 0.9 % 250 mL IVPB (500 mg Intravenous New Bag/Given 01/21/21 1454)  metoprolol succinate (TOPROL-XL) 24 hr tablet 50 mg (has no administration in time range)  feeding supplement (ENSURE ENLIVE / ENSURE PLUS) liquid 237 mL (has no administration in time range)  multivitamin with minerals tablet 1 tablet (has no administration in time range)  calcium gluconate 1 g/ 50 mL sodium chloride IVPB (0 mg Intravenous Stopped 01/21/21 0005)  potassium chloride SA (KLOR-CON) CR tablet 40 mEq (40 mEq Oral Given 01/20/21 2120)  potassium chloride 10 mEq in 100 mL IVPB (0 mEq Intravenous Stopped 01/21/21 0110)  sodium chloride 0.9 % bolus 1,000 mL (0 mLs Intravenous Stopped 01/21/21 0009)  LORazepam (ATIVAN) injection 0.5 mg (0.5 mg Intravenous Given 01/21/21 0044)  magnesium sulfate IVPB 2 g 50 mL (2 g Intravenous New Bag/Given 01/21/21 0257)  metoprolol tartrate (LOPRESSOR) injection 2.5 mg (2.5 mg Intravenous Given 01/21/21 0246)  magnesium sulfate IVPB 2 g 50 mL (2 g Intravenous New Bag/Given 01/21/21 0412)  calcium gluconate 1 g/ 50 mL sodium chloride IVPB (1,000 mg Intravenous New Bag/Given 01/21/21 1018)    Vitals:    01/21/21 0108 01/21/21 0131 01/21/21 0447 01/21/21 0900  BP: 135/78  124/74 121/68  Pulse: (!) 137 (!) 108 97 99  Resp: 16 (!) 26 16 16   Temp: 99.1 F (37.3 C)  (!) 101.3 F (38.5 C) 99 F (37.2 C)  TempSrc: Oral  Oral Axillary  SpO2: (!) 84% 93% 99% 98%  Weight:      Height:        Final diagnoses:  AKI (acute kidney injury) ([place])  Delirium    Admission/ observation were discussed with the admitting physician, patient and/or family and they are comfortable with the plan.     Final Clinical Impression(s) / ED Diagnoses Final diagnoses:  AKI (acute kidney injury) [facility])  Delirium    Rx / DC Orders ED Discharge Orders     None        Deno Etienne, DO 01/21/21 1509

## 2021-01-20 NOTE — ED Notes (Signed)
Pt appears confused and anxious.  Asked for me to call his daughter Jocelyn Lamer so she could pick him up.  I called Jocelyn Lamer and placed her on speaker.  Pt appears calmer and satisfied that he has spoken with his daughter.

## 2021-01-21 DIAGNOSIS — N182 Chronic kidney disease, stage 2 (mild): Secondary | ICD-10-CM | POA: Diagnosis present

## 2021-01-21 DIAGNOSIS — Z66 Do not resuscitate: Secondary | ICD-10-CM | POA: Diagnosis present

## 2021-01-21 DIAGNOSIS — D72829 Elevated white blood cell count, unspecified: Secondary | ICD-10-CM

## 2021-01-21 DIAGNOSIS — I493 Ventricular premature depolarization: Secondary | ICD-10-CM | POA: Diagnosis present

## 2021-01-21 DIAGNOSIS — Z8673 Personal history of transient ischemic attack (TIA), and cerebral infarction without residual deficits: Secondary | ICD-10-CM | POA: Diagnosis not present

## 2021-01-21 DIAGNOSIS — I5032 Chronic diastolic (congestive) heart failure: Secondary | ICD-10-CM | POA: Diagnosis present

## 2021-01-21 DIAGNOSIS — Z781 Physical restraint status: Secondary | ICD-10-CM | POA: Diagnosis not present

## 2021-01-21 DIAGNOSIS — J45909 Unspecified asthma, uncomplicated: Secondary | ICD-10-CM | POA: Diagnosis present

## 2021-01-21 DIAGNOSIS — I130 Hypertensive heart and chronic kidney disease with heart failure and stage 1 through stage 4 chronic kidney disease, or unspecified chronic kidney disease: Secondary | ICD-10-CM | POA: Diagnosis present

## 2021-01-21 DIAGNOSIS — J189 Pneumonia, unspecified organism: Secondary | ICD-10-CM | POA: Diagnosis present

## 2021-01-21 DIAGNOSIS — E43 Unspecified severe protein-calorie malnutrition: Secondary | ICD-10-CM | POA: Diagnosis present

## 2021-01-21 DIAGNOSIS — Y92121 Bathroom in nursing home as the place of occurrence of the external cause: Secondary | ICD-10-CM | POA: Diagnosis not present

## 2021-01-21 DIAGNOSIS — W0110XA Fall on same level from slipping, tripping and stumbling with subsequent striking against unspecified object, initial encounter: Secondary | ICD-10-CM | POA: Diagnosis present

## 2021-01-21 DIAGNOSIS — R4182 Altered mental status, unspecified: Secondary | ICD-10-CM | POA: Diagnosis present

## 2021-01-21 DIAGNOSIS — N189 Chronic kidney disease, unspecified: Secondary | ICD-10-CM

## 2021-01-21 DIAGNOSIS — M4802 Spinal stenosis, cervical region: Secondary | ICD-10-CM | POA: Diagnosis present

## 2021-01-21 DIAGNOSIS — G9341 Metabolic encephalopathy: Secondary | ICD-10-CM | POA: Diagnosis present

## 2021-01-21 DIAGNOSIS — E876 Hypokalemia: Secondary | ICD-10-CM | POA: Diagnosis present

## 2021-01-21 DIAGNOSIS — Z993 Dependence on wheelchair: Secondary | ICD-10-CM | POA: Diagnosis not present

## 2021-01-21 DIAGNOSIS — I480 Paroxysmal atrial fibrillation: Secondary | ICD-10-CM | POA: Diagnosis present

## 2021-01-21 DIAGNOSIS — N179 Acute kidney failure, unspecified: Secondary | ICD-10-CM

## 2021-01-21 DIAGNOSIS — J9601 Acute respiratory failure with hypoxia: Secondary | ICD-10-CM | POA: Diagnosis present

## 2021-01-21 DIAGNOSIS — I340 Nonrheumatic mitral (valve) insufficiency: Secondary | ICD-10-CM | POA: Diagnosis present

## 2021-01-21 DIAGNOSIS — Z8249 Family history of ischemic heart disease and other diseases of the circulatory system: Secondary | ICD-10-CM | POA: Diagnosis not present

## 2021-01-21 DIAGNOSIS — H9190 Unspecified hearing loss, unspecified ear: Secondary | ICD-10-CM | POA: Diagnosis present

## 2021-01-21 DIAGNOSIS — Z20822 Contact with and (suspected) exposure to covid-19: Secondary | ICD-10-CM | POA: Diagnosis present

## 2021-01-21 DIAGNOSIS — Z6825 Body mass index (BMI) 25.0-25.9, adult: Secondary | ICD-10-CM | POA: Diagnosis not present

## 2021-01-21 DIAGNOSIS — Z87442 Personal history of urinary calculi: Secondary | ICD-10-CM | POA: Diagnosis not present

## 2021-01-21 LAB — MAGNESIUM: Magnesium: 1.1 mg/dL — ABNORMAL LOW (ref 1.7–2.4)

## 2021-01-21 LAB — GASTROINTESTINAL PANEL BY PCR, STOOL (REPLACES STOOL CULTURE)

## 2021-01-21 LAB — BASIC METABOLIC PANEL
Anion gap: 13 (ref 5–15)
BUN: 28 mg/dL — ABNORMAL HIGH (ref 8–23)
CO2: 26 mmol/L (ref 22–32)
Calcium: 6.1 mg/dL — CL (ref 8.9–10.3)
Chloride: 100 mmol/L (ref 98–111)
Creatinine, Ser: 1.8 mg/dL — ABNORMAL HIGH (ref 0.61–1.24)
GFR, Estimated: 34 mL/min — ABNORMAL LOW (ref >60)
Glucose, Bld: 118 mg/dL — ABNORMAL HIGH (ref 70–99)
Potassium: 3.5 mmol/L (ref 3.5–5.1)
Sodium: 139 mmol/L (ref 135–145)

## 2021-01-21 LAB — STREP PNEUMONIAE URINARY ANTIGEN: Strep Pneumo Urinary Antigen: NEGATIVE

## 2021-01-21 LAB — PROCALCITONIN: Procalcitonin: 0.23 ng/mL

## 2021-01-21 LAB — MRSA NEXT GEN BY PCR, NASAL: MRSA by PCR Next Gen: NOT DETECTED

## 2021-01-21 MED ORDER — MAGNESIUM SULFATE 2 GM/50ML IV SOLN
2.0000 g | Freq: Once | INTRAVENOUS | Status: AC
  Administered 2021-01-21: 2 g via INTRAVENOUS
  Filled 2021-01-21: qty 50

## 2021-01-21 MED ORDER — ACETAMINOPHEN 650 MG RE SUPP
650.0000 mg | RECTAL | Status: DC | PRN
  Administered 2021-01-21: 650 mg via RECTAL
  Filled 2021-01-21: qty 1

## 2021-01-21 MED ORDER — CALCIUM GLUCONATE-NACL 1-0.675 GM/50ML-% IV SOLN
1.0000 g | Freq: Once | INTRAVENOUS | Status: AC
  Administered 2021-01-21: 1000 mg via INTRAVENOUS
  Filled 2021-01-21: qty 50

## 2021-01-21 MED ORDER — ADULT MULTIVITAMIN W/MINERALS CH
1.0000 | ORAL_TABLET | Freq: Every day | ORAL | Status: DC
  Administered 2021-01-21 – 2021-01-25 (×5): 1 via ORAL
  Filled 2021-01-21 (×5): qty 1

## 2021-01-21 MED ORDER — POTASSIUM CHLORIDE IN NACL 40-0.9 MEQ/L-% IV SOLN
INTRAVENOUS | Status: DC
  Filled 2021-01-21 (×2): qty 1000

## 2021-01-21 MED ORDER — METOPROLOL SUCCINATE ER 50 MG PO TB24
50.0000 mg | ORAL_TABLET | Freq: Every day | ORAL | Status: DC
  Administered 2021-01-22 – 2021-01-25 (×3): 50 mg via ORAL
  Filled 2021-01-21 (×4): qty 1

## 2021-01-21 MED ORDER — POTASSIUM CHLORIDE 10 MEQ/100ML IV SOLN
10.0000 meq | INTRAVENOUS | Status: DC
  Administered 2021-01-21 (×6): 10 meq via INTRAVENOUS
  Filled 2021-01-21 (×6): qty 100

## 2021-01-21 MED ORDER — APIXABAN 2.5 MG PO TABS
2.5000 mg | ORAL_TABLET | Freq: Two times a day (BID) | ORAL | Status: DC
  Administered 2021-01-21 – 2021-01-25 (×9): 2.5 mg via ORAL
  Filled 2021-01-21 (×10): qty 1

## 2021-01-21 MED ORDER — HALOPERIDOL LACTATE 5 MG/ML IJ SOLN
1.0000 mg | Freq: Four times a day (QID) | INTRAMUSCULAR | Status: DC | PRN
Start: 2021-01-21 — End: 2021-01-25
  Administered 2021-01-23 – 2021-01-24 (×2): 1 mg via INTRAVENOUS
  Filled 2021-01-21 (×3): qty 1

## 2021-01-21 MED ORDER — SODIUM CHLORIDE 0.9 % IV SOLN
500.0000 mg | INTRAVENOUS | Status: DC
  Administered 2021-01-21 – 2021-01-23 (×3): 500 mg via INTRAVENOUS
  Filled 2021-01-21 (×4): qty 500

## 2021-01-21 MED ORDER — MORPHINE SULFATE (PF) 2 MG/ML IV SOLN
2.0000 mg | INTRAVENOUS | Status: DC | PRN
  Administered 2021-01-24: 2 mg via INTRAVENOUS
  Filled 2021-01-21: qty 1

## 2021-01-21 MED ORDER — METOPROLOL TARTRATE 5 MG/5ML IV SOLN
2.5000 mg | Freq: Once | INTRAVENOUS | Status: AC
  Administered 2021-01-21: 2.5 mg via INTRAVENOUS
  Filled 2021-01-21: qty 5

## 2021-01-21 MED ORDER — SODIUM CHLORIDE 0.9 % IV SOLN
1.0000 g | INTRAVENOUS | Status: DC
Start: 2021-01-21 — End: 2021-01-24
  Administered 2021-01-21 – 2021-01-23 (×3): 1 g via INTRAVENOUS
  Filled 2021-01-21 (×3): qty 10

## 2021-01-21 MED ORDER — METOPROLOL TARTRATE 5 MG/5ML IV SOLN
2.5000 mg | Freq: Four times a day (QID) | INTRAVENOUS | Status: AC
  Administered 2021-01-21 – 2021-01-22 (×5): 2.5 mg via INTRAVENOUS
  Filled 2021-01-21 (×3): qty 5

## 2021-01-21 MED ORDER — METOPROLOL SUCCINATE ER 25 MG PO TB24: 25.0000 mg | ORAL_TABLET | Freq: Every day | ORAL | Status: DC

## 2021-01-21 MED ORDER — APIXABAN 5 MG PO TABS: 5.0000 mg | ORAL_TABLET | Freq: Two times a day (BID) | ORAL | Status: DC

## 2021-01-21 MED ORDER — ENSURE ENLIVE PO LIQD
237.0000 mL | Freq: Three times a day (TID) | ORAL | Status: DC
  Administered 2021-01-21 – 2021-01-25 (×10): 237 mL via ORAL

## 2021-01-21 MED ORDER — LORAZEPAM 2 MG/ML IJ SOLN
0.5000 mg | Freq: Once | INTRAMUSCULAR | Status: AC
  Administered 2021-01-21: 0.5 mg via INTRAVENOUS
  Filled 2021-01-21: qty 1

## 2021-01-21 NOTE — ED Notes (Signed)
PT continually attempting to get out of bed, pulling off monitoring equipment.  MD notified.  Large bm noted.  Bedding changed and diaper placed.  Pt did use urinal.

## 2021-01-21 NOTE — Progress Notes (Addendum)
[facility] 4V42 [name]) hospitalized hospice patient visit.  This is a current [facility] hospice patient from [facility] facility with a hospice diagnosis of chronic diastolic heart failure.   Patient experienced a fall in the bathroom and hit his head. Facility contact family, EMS was activated and patient was admitted to [facility] on 01/20/21 with acute metabolic encephalopathy. Per Dr. Gildardo Cranker with [facility], this is a related admission.   Visited patient at bedside. Patient was sleeping, no apparent distress, respirations regular and unlabored. On oxygen 4Lnc. No family at bedside. Patient was febrile yesterday- 101.3.   This patient is appropriate for inpatient GIP level of care due to need to for IV fluids and IV antibiotics.   VS: 99, 121/68, 99, 16, 98% 4Lnc I/O: 1250/not documented   Abnormal labs:  01/21/21 03:51 Glucose: 118 (H) BUN: 28 (H) Creatinine: 1.80 (H) Calcium: 6.1 (LL) Magnesium: 1.1 (L) 01/20/21 18:04 NEUT#: 8.7 (H) Monocyte #: 1.4 (H) WBC: 11.7 (H) RBC: 3.27 (L) Hemoglobin: 10.0 (L) HCT: 30.8 (L)  Diagnostics: CXR IMPRESSION: 1. Peripheral opacity at the left lung base. This may represent pneumonia in the appropriate clinical setting. Appearance is unusual for pulmonary contusion 2. Stable cardiomegaly. CT head  IMPRESSION: 1. Atrophy and chronic microvascular ischemic change. No acute intracranial abnormality. 2. Negative for cervical spine fracture.  Multilevel spondylosis.  IV/PRN Medications: NS KCL 42mEq @ 47ml/hr, Zithromax 500mg  IVPB QD, Rocephin 1g IVPB QD, Tylenol 650 mg suppository prn @ 5956  Problem List: Acute metabolic encephalopathy Agitation/delirium -No known history of dementia but patient has required restraints overnight.  DC restraints as soon as able.  Use Haldol as needed. -Monitor mental status.  Fall precautions.   Fever Possible left basilar pneumonia Acute respiratory failure with hypoxia -Patient had a  temperature spike this morning.  Chest x-ray on presentation showed possible left lung base opacity.  Check blood cultures.  UA was negative for UTI on presentation.  Start Rocephin and Zithromax.   AKI on CKD stage II -Creatinine 2.04 on presentation; remote baseline around 1.3.  Treated with IV fluids.  Creatinine 1.80 this morning.  Monitor   Mild leukocytosis -Repeat a.m. labs   Paroxysmal A. Fib -Intermittently tachycardic.  Continue Eliquis.  Currently on IV metoprolol.  Switch to oral once able to tolerate oral   Chronic diastolic CHF -Continue gentle hydration.  Strict input output.  Daily weights.   Spinal stenosis, wheelchair-bound, on hospice at home -Consult palliative care for goals of care discussion.  If condition does not improve, recommend total comfort measures  Discharge planning: Return to [facility] after discharge with hospice services  Family Contact: Spoke with daughter by phone   IDG: team updated  Goals of Care: Clear. DNR.   If ambulance transport is needed at discharge please use [facility].  Please do not hesitate to call with questions.   Thank you,   Farrel Gordon, RN, [facility] Liaison   [phone]

## 2021-01-21 NOTE — Progress Notes (Signed)
Initial Nutrition Assessment  DOCUMENTATION CODES:   Severe malnutrition in context of chronic illness  INTERVENTION:   Recommend diet liberalization  -Ensure Enlive po TID, each supplement provides 350 kcal and 20 grams of protein -MVI with minerals daily  NUTRITION DIAGNOSIS:   Severe Malnutrition related to chronic illness (CHF) as evidenced by severe muscle depletion, severe fat depletion.  GOAL:   Patient will meet greater than or equal to 90% of their needs  MONITOR:   PO intake, Supplement acceptance, Weight trends, Labs, Skin  REASON FOR ASSESSMENT:   Malnutrition Screening Tool    ASSESSMENT:   Pt is a current [facility] hosp[facility] pt from [place] with a hospice diagnosis of chronic diastolic heart failure. Pt experienced a fall in the bathroom and hit his head. EMS was activated and pt was admitted to [facility] on 01/20/21 with acute metabolic encephalopathy. PMH includes spinal stenosis wheelchair bound, paroxysmal atrial fibrillation on Eliquis, CVA, chronic diastolic heart failure, CKD stage II, and GERD.  Discussed pt with RN.   Patient unable to provide history due to hearing impairment and confusion. Per pt's  son, pt w/ 10 day h/o diarrhea. Pt was initially given Imodium and another medication by hospice with improving symptoms. Son also reports pt has been c/o nausea but not sure if he has been vomiting.  Pt has also been having a lack of energy.  Today he was transferring from the toilet to his wheelchair and slipped out hitting one of his ears and was brought to the ED.  Weight history reviewed. No significant weight history noted.   PO Intake: 0% x 1 recorded meal   No UOP documented x24 hours I/O: +1220ml since admit  Edema: moderate pitting edema to BLE per RN assessment  Medications: Scheduled Meds:  apixaban  2.5 mg Oral BID   [START ON 01/22/2021] metoprolol succinate  50 mg Oral Daily   metoprolol tartrate  2.5 mg Intravenous Q6H   Continuous Infusions:  0.9 % NaCl with KCl 40 mEq / L 50 mL/hr at 01/21/21 1212   azithromycin     cefTRIAXone (ROCEPHIN)  IV 1 g (01/21/21 1223)   Labs: Recent Labs  Lab 01/20/21 1804 01/20/21 2147 01/21/21 0351  NA 138 139 139  K 2.6* 2.8* 3.5  CL 96* 97* 100  CO2 29 28 26   BUN 31* 29* 28*  CREATININE 2.04* 1.96* 1.80*  CALCIUM 6.0* 6.2* 6.1*  MG  --  0.6* 1.1*  GLUCOSE 124* 128* 118*   NUTRITION - FOCUSED PHYSICAL EXAM:  Flowsheet Row Most Recent Value  Orbital Region Severe depletion  Upper Arm Region Severe depletion  Thoracic and Lumbar Region Severe depletion  Buccal Region Severe depletion  Temple Region Severe depletion  Clavicle Bone Region Severe depletion  Clavicle and Acromion Bone Region Severe depletion  Scapular Bone Region Severe depletion  Dorsal Hand Severe depletion  Patellar Region Severe depletion  Anterior Thigh Region Severe depletion  Posterior Calf Region Severe depletion  Edema (RD Assessment) None  Hair Reviewed  Eyes Reviewed  Mouth Reviewed  Skin Reviewed  Nails Reviewed       Diet Order:   Diet Order             Diet Heart Room service appropriate? Yes; Fluid consistency: Thin  Diet effective now                   EDUCATION NEEDS:   No education needs have been identified at this time  Skin:  Skin Assessment: Reviewed RN Assessment  Last BM:  11/17  Height:   Ht Readings from Last 1 Encounters:  01/20/21 5\' 7"  (1.702 m)    Weight:   Wt Readings from Last 10 Encounters:  01/20/21 74.5 kg  08/17/19 74.5 kg  07/07/19 67.6 kg  02/21/19 65.8 kg  02/04/19 65.8 kg  10/22/18 65.3 kg  10/03/18 65.2 kg  09/24/18 69.9 kg  09/24/18 69.9 kg  09/17/18 71.2 kg   BMI:  Body mass index is 25.72 kg/m.  Estimated Nutritional Needs:   Kcal:  1850-2050  Protein:  90-105 grams  Fluid:  >1.85L/d     Theone Stanley., MS, RD, LDN (she/her/hers) RD pager number and weekend/on-call pager number located in [place].

## 2021-01-21 NOTE — Progress Notes (Signed)
Patient ID: MORT SMELSER, male   DOB: October 29, 1921, 87 y.o.   MRN: [id number]  PROGRESS NOTE    Cody Mendoza  WUJ:[id number] DOB: May 19, 1921 DOA: 01/20/2021 PCP: Crist Infante, MD   Brief Narrative:  87 y.o. male with medical history significant for spinal stenosis wheelchair bound, on hospice at home, paroxysmal atrial fibrillation on Eliquis, CVA, chronic diastolic heart failure, CKD stage II, and GERD presented with a fall and altered mental status.  Patient was confused on presentation and cannot provide any history.  On presentation, he was afebrile, normotensive and mildly tachycardic with WBCs of 11.7, potassium of 2.6, creatinine of 2.04 (remote baseline of around 1.3) calcium of 6 with albumin of 2.7.  CT of the head and cervical spine was negative.  Chest x-ray showed left lung base opacity with possible pneumonia.  Assessment & Plan:   Fall secondary to weakness Hypocalcemia Hypokalemia: Resolved Hypomagnesemia -Presented with fall and was found to have multiple electrolyte abnormalities?  Secondary to diarrhea.  Replace calcium and magnesium.  Repeat a.m. labs.  Acute metabolic encephalopathy Agitation/delirium -No known history of dementia but patient has required restraints overnight.  DC restraints as soon as able.  Use Haldol as needed. -Monitor mental status.  Fall precautions.  Fever Possible left basilar pneumonia Acute respiratory failure with hypoxia -Patient had a temperature spike this morning.  Chest x-ray on presentation showed possible left lung base opacity.  Check blood cultures.  UA was negative for UTI on presentation.  Start Rocephin and Zithromax.  AKI on CKD stage II -Creatinine 2.04 on presentation; remote baseline around 1.3.  Treated with IV fluids.  Creatinine 1.80 this morning.  Monitor  Mild leukocytosis -Repeat a.m. labs  Paroxysmal A. Fib -Intermittently tachycardic.  Continue Eliquis.  Currently on IV metoprolol.  Switch to oral once  able to tolerate oral  Chronic diastolic CHF -Continue gentle hydration.  Strict input output.  Daily weights.  Spinal stenosis, wheelchair-bound, on hospice at home -Consult palliative care for goals of care discussion.  If condition does not improve, recommend total comfort measures  DVT prophylaxis: Eliquis Code Status: DNR Family Communication: None at bedside Disposition Plan: Status is: Inpatient  Remains inpatient appropriate because: Of confusion, electrolyte abnormalities, fever  Consultants: Consult palliative care  Procedures: None  Antimicrobials: None   Subjective: Patient seen and examined at bedside. Slightly, hardly answers any questions.  Nursing staff reports overnight agitation requiring restraints.  Patient had fever earlier this morning.  Objective: Vitals:   01/21/21 0108 01/21/21 0131 01/21/21 0447 01/21/21 0900  BP: 135/78  124/74 121/68  Pulse: (!) 137 (!) 108 97 99  Resp: 16 (!) 26 16 16   Temp: 99.1 F (37.3 C)  (!) 101.3 F (38.5 C) 99 F (37.2 C)  TempSrc: Oral  Oral Axillary  SpO2: (!) 84% 93% 99% 98%  Weight:      Height:        Intake/Output Summary (Last 24 hours) at 01/21/2021 1027 Last data filed at 01/21/2021 0900 Gross per 24 hour  Intake 1250 ml  Output --  Net 1250 ml   Filed Weights   01/20/21 1810  Weight: 74.5 kg    Examination:  General exam: Elderly male lying in bed.  No distress.  Currently on 4 L oxygen via nasal cannula.   Respiratory system: Bilateral decreased breath sounds at bases with scattered crackles and tachypnea Cardiovascular system: S1 & S2 heard, intermittently tachycardic Gastrointestinal system: Abdomen is nondistended, soft and nontender. Normal bowel  sounds heard. Extremities: No cyanosis, clubbing; trace lower extremity edema present Central nervous system: Sleepy, wakes up slightly, hardly answers any questions, very poor historian.  No focal neurological deficits. Moving extremities.  Has  bilateral wrist restraints Skin: No rashes, lesions or ulcers Psychiatry: Could not be assessed because of patient being a poor historian  Data Reviewed: I have personally reviewed following labs and imaging studies  CBC: Recent Labs  Lab 01/20/21 1804  WBC 11.7*  NEUTROABS 8.7*  HGB 10.0*  HCT 30.8*  MCV 94.2  PLT 326   Basic Metabolic Panel: Recent Labs  Lab 01/20/21 1804 01/20/21 2147 01/21/21 0351  NA 138 139 139  K 2.6* 2.8* 3.5  CL 96* 97* 100  CO2 29 28 26   GLUCOSE 124* 128* 118*  BUN 31* 29* 28*  CREATININE 2.04* 1.96* 1.80*  CALCIUM 6.0* 6.2* 6.1*  MG  --  0.6* 1.1*   GFR: Estimated Creatinine Clearance: 21.9 mL/min (A) (by C-G formula based on SCr of 1.8 mg/dL (H)). Liver Function Tests: Recent Labs  Lab 01/20/21 1804  AST 37  ALT 23  ALKPHOS 109  BILITOT 2.0*  PROT 6.5  ALBUMIN 2.7*   No results for input(s): LIPASE, AMYLASE in the last 168 hours. No results for input(s): AMMONIA in the last 168 hours. Coagulation Profile: No results for input(s): INR, PROTIME in the last 168 hours. Cardiac Enzymes: No results for input(s): CKTOTAL, CKMB, CKMBINDEX, TROPONINI in the last 168 hours. BNP (last 3 results) No results for input(s): PROBNP in the last 8760 hours. HbA1C: No results for input(s): HGBA1C in the last 72 hours. CBG: No results for input(s): GLUCAP in the last 168 hours. Lipid Profile: No results for input(s): CHOL, HDL, LDLCALC, TRIG, CHOLHDL, LDLDIRECT in the last 72 hours. Thyroid Function Tests: No results for input(s): TSH, T4TOTAL, FREET4, T3FREE, THYROIDAB in the last 72 hours. Anemia Panel: No results for input(s): VITAMINB12, FOLATE, FERRITIN, TIBC, IRON, RETICCTPCT in the last 72 hours. Sepsis Labs: Recent Labs  Lab 01/20/21 2147  PROCALCITON 0.23    Recent Results (from the past 240 hour(s))  Resp Panel by RT-PCR (Flu A&B, Covid) Nasopharyngeal Swab     Status: None   Collection Time: 01/20/21  9:09 PM   Specimen:  Nasopharyngeal Swab; Nasopharyngeal(NP) swabs in vial transport medium  Result Value Ref Range Status   SARS Coronavirus 2 by RT PCR NEGATIVE NEGATIVE Final    Comment: (NOTE) SARS-CoV-2 target nucleic acids are NOT DETECTED.  The SARS-CoV-2 RNA is generally detectable in upper respiratory specimens during the acute phase of infection. The lowest concentration of SARS-CoV-2 viral copies this assay can detect is 138 copies/mL. A negative result does not preclude SARS-Cov-2 infection and should not be used as the sole basis for treatment or other patient management decisions. A negative result may occur with  improper specimen collection/handling, submission of specimen other than nasopharyngeal swab, presence of viral mutation(s) within the areas targeted by this assay, and inadequate number of viral copies(<138 copies/mL). A negative result must be combined with clinical observations, patient history, and epidemiological information. The expected result is Negative.  Fact Sheet for Patients:  EntrepreneurPulse.com.au  Fact Sheet for Healthcare Providers:  IncredibleEmployment.be  This test is no t yet approved or cleared by the [place] FDA and  has been authorized for detection and/or diagnosis of SARS-CoV-2 by FDA under an Emergency Use Authorization (EUA). This EUA will remain  in effect (meaning this test can be used) for the duration  of the COVID-19 declaration under Section 564(b)(1) of the Act, 21 U.S.C.section 360bbb-3(b)(1), unless the authorization is terminated  or revoked sooner.       Influenza A by PCR NEGATIVE NEGATIVE Final   Influenza B by PCR NEGATIVE NEGATIVE Final    Comment: (NOTE) The Xpert Xpress SARS-CoV-2/FLU/RSV plus assay is intended as an aid in the diagnosis of influenza from Nasopharyngeal swab specimens and should not be used as a sole basis for treatment. Nasal washings and aspirates are unacceptable for  Xpert Xpress SARS-CoV-2/FLU/RSV testing.  Fact Sheet for Patients: EntrepreneurPulse.com.au  Fact Sheet for Healthcare Providers: IncredibleEmployment.be  This test is not yet approved or cleared by the [place] FDA and has been authorized for detection and/or diagnosis of SARS-CoV-2 by FDA under an Emergency Use Authorization (EUA). This EUA will remain in effect (meaning this test can be used) for the duration of the COVID-19 declaration under Section 564(b)(1) of the Act, 21 U.S.C. section 360bbb-3(b)(1), unless the authorization is terminated or revoked.  Performed at [facility], [place] [address], [place], [place] [address]   MRSA Next Gen by PCR, Nasal     Status: None   Collection Time: 01/21/21  5:11 AM   Specimen: Nasal Swab  Result Value Ref Range Status   MRSA by PCR Next Gen NOT DETECTED NOT DETECTED Final    Comment: (NOTE) The GeneXpert MRSA Assay (FDA approved for NASAL specimens only), is one component of a comprehensive MRSA colonization surveillance program. It is not intended to diagnose MRSA infection nor to guide or monitor treatment for MRSA infections. Test performance is not FDA approved in patients less than 62 years old. Performed at [facility], [place] [address], [place], [place] [address]          Radiology Studies: CT Head Wo Contrast  Result Date: 01/20/2021 CLINICAL DATA:  Fall.  Head injury EXAM: CT HEAD WITHOUT CONTRAST CT CERVICAL SPINE WITHOUT CONTRAST TECHNIQUE: Multidetector CT imaging of the head and cervical spine was performed following the standard protocol without intravenous contrast. Multiplanar CT image reconstructions of the cervical spine were also generated. COMPARISON:  CT head 06/08/2018 FINDINGS: CT HEAD FINDINGS Brain: Negative for acute hemorrhage. Negative for acute infarct or mass Moderate atrophy and moderate chronic microvascular ischemic change, similar to the prior  study. Vascular: Negative for hyperdense vessel Skull: Negative Sinuses/Orbits: Mild mucosal edema paranasal sinuses. Bilateral cataract extraction Other: None CT CERVICAL SPINE FINDINGS Alignment: Mild anterolisthesis C5-6 Skull base and vertebrae: Negative for fracture Soft tissues and spinal canal: No acute abnormality. Atherosclerotic calcification. Disc levels: Disc degeneration and facet degeneration throughout the cervical spine. Foraminal stenosis bilaterally due to spurring. Upper chest: Lung apices clear bilaterally Other: None IMPRESSION: 1. Atrophy and chronic microvascular ischemic change. No acute intracranial abnormality. 2. Negative for cervical spine fracture.  Multilevel spondylosis. Electronically Signed   By: Franchot Gallo M.D.   On: 01/20/2021 18:50   CT Cervical Spine Wo Contrast  Result Date: 01/20/2021 CLINICAL DATA:  Fall.  Head injury EXAM: CT HEAD WITHOUT CONTRAST CT CERVICAL SPINE WITHOUT CONTRAST TECHNIQUE: Multidetector CT imaging of the head and cervical spine was performed following the standard protocol without intravenous contrast. Multiplanar CT image reconstructions of the cervical spine were also generated. COMPARISON:  CT head 06/08/2018 FINDINGS: CT HEAD FINDINGS Brain: Negative for acute hemorrhage. Negative for acute infarct or mass Moderate atrophy and moderate chronic microvascular ischemic change, similar to the prior study. Vascular: Negative for hyperdense vessel Skull:  Negative Sinuses/Orbits: Mild mucosal edema paranasal sinuses. Bilateral cataract extraction Other: None CT CERVICAL SPINE FINDINGS Alignment: Mild anterolisthesis C5-6 Skull base and vertebrae: Negative for fracture Soft tissues and spinal canal: No acute abnormality. Atherosclerotic calcification. Disc levels: Disc degeneration and facet degeneration throughout the cervical spine. Foraminal stenosis bilaterally due to spurring. Upper chest: Lung apices clear bilaterally Other: None IMPRESSION: 1.  Atrophy and chronic microvascular ischemic change. No acute intracranial abnormality. 2. Negative for cervical spine fracture.  Multilevel spondylosis. Electronically Signed   By: Franchot Gallo M.D.   On: 01/20/2021 18:50   DG Chest Port 1 View  Result Date: 01/20/2021 CLINICAL DATA:  Fatigue.  Fall. EXAM: PORTABLE CHEST 1 VIEW COMPARISON:  Chest radiograph 08/15/2019 CT 08/23/2018 FINDINGS: Lung volumes are low. Stable cardiomegaly. Aortic atherosclerosis. Left lung base opacity is greatest in the periphery. Mild chronic interstitial coarsening. No pneumothorax. No significant pleural effusion. No acute osseous abnormalities are seen. IMPRESSION: 1. Peripheral opacity at the left lung base. This may represent pneumonia in the appropriate clinical setting. Appearance is unusual for pulmonary contusion 2. Stable cardiomegaly. Aortic Atherosclerosis (ICD10-I70.0). Electronically Signed   By: Keith Rake M.D.   On: 01/20/2021 18:14        Scheduled Meds:  apixaban  2.5 mg Oral BID   metoprolol tartrate  2.5 mg Intravenous Q6H   Continuous Infusions:  calcium gluconate 1,000 mg (01/21/21 1018)          Aline August, MD [facility] 01/21/2021, 10:27 AM

## 2021-01-21 NOTE — Progress Notes (Signed)
New Admission Note:    Arrival Method: ED stretcher Mental Orientation:   Oriented to self Telemetry: 5M18 Assessment: Patient confused, restless and agitated on arrival to unit. Patient in existing soft restraints due to pulling lines and agitation. Initiated O2 at 4L patient, telemetry. Notified MD of patient status and received new orders from on call physician. IV:   Right forearm Pain:  0/10 Tubes: N/A Safety Measures: Bilateral wrist restraints Admission: Completed [facility] Orientation: Unable to orient to unit due to altered mental status. Family: Not at bedside         Orders have been reviewed and implemented. Will continue to monitor the patient. Call light has been placed within reach and bed alarm has been activated.      01/21/21 0108  Vitals  Temp 99.1 F (37.3 C)  Temp Source Oral  BP 135/78  MAP (mmHg) 96  BP Location Left Arm  BP Method Automatic  Patient Position (if appropriate) Lying  Pulse Rate (!) 137  Resp 16  MEWS COLOR  MEWS Score Color Yellow  Oxygen Therapy  SpO2 (!) 84 %  O2 Device [place]   Pain Assessment  Pain Scale Faces  Pain Score 0  MEWS Score  MEWS Temp 0  MEWS Systolic 0  MEWS Pulse 3  MEWS RR 0  MEWS LOC 0  MEWS Score 3  Provider Notification  Provider Name/Title Dr. Cyd Silence  Date Provider Notified 01/21/21  Notification Type Page  Notification Reason Change in status  Provider response See new orders  Date of Provider Response 01/21/21  Time of Provider Response 0108  Rapid Response Notification  Name of Rapid Response RN Notified Shanon Brow, RN  Date Rapid Response Notified 01/21/21  Time Rapid Response Notified 0108

## 2021-01-21 NOTE — Progress Notes (Signed)
   01/21/21 0108  Assess: MEWS Score  Temp 99.1 F (37.3 C)  BP 135/78  Pulse Rate (!) 137  Resp 16  SpO2 (!) 84 %  O2 Device Room Air  Assess: MEWS Score  MEWS Temp 0  MEWS Systolic 0  MEWS Pulse 3  MEWS RR 0  MEWS LOC 0  MEWS Score 3  MEWS Score Color Yellow  Assess: if the MEWS score is Yellow or Red  Were vital signs taken at a resting state? Yes  Focused Assessment Change from prior assessment (see assessment flowsheet)  Early Detection of Sepsis Score *See Row Information* Medium  MEWS guidelines implemented *See Row Information* Yes  Treat  MEWS Interventions Administered scheduled meds/treatments;Escalated (See documentation below)  Pain Scale Faces  Pain Score 0  Take Vital Signs  Increase Vital Sign Frequency  Yellow: Q 2hr X 2 then Q 4hr X 2, if remains yellow, continue Q 4hrs  Escalate  MEWS: Escalate Yellow: discuss with charge nurse/RN and consider discussing with provider and RRT  Notify: Charge Nurse/RN  Name of Charge Nurse/RN Notified [place], RN  Date Charge Nurse/RN Notified 01/21/21  Time Charge Nurse/RN Notified 0108  Notify: Provider  Provider Name/Title Dr. Cyd Silence  Date Provider Notified 01/21/21  Notification Type Page  Notification Reason Change in status  Provider response See new orders  Date of Provider Response 01/21/21  Time of Provider Response 0108  Notify: Rapid Response  Name of Rapid Response RN Notified Shanon Brow, RN  Date Rapid Response Notified 01/21/21  Time Rapid Response Notified 0108  Document  Patient Outcome Not stable and remains on department  Progress note created (see row info) Yes

## 2021-01-22 LAB — CBC WITH DIFFERENTIAL/PLATELET
Abs Immature Granulocytes: 0.66 10*3/uL — ABNORMAL HIGH (ref 0.00–0.07)
Basophils Absolute: 0 10*3/uL (ref 0.0–0.1)
Basophils Relative: 0 %
Eosinophils Absolute: 0 10*3/uL (ref 0.0–0.5)
Eosinophils Relative: 0 %
HCT: 29.5 % — ABNORMAL LOW (ref 39.0–52.0)
Hemoglobin: 9.7 g/dL — ABNORMAL LOW (ref 13.0–17.0)
Immature Granulocytes: 3 %
Lymphocytes Relative: 2 %
Lymphs Abs: 0.6 10*3/uL — ABNORMAL LOW (ref 0.7–4.0)
MCH: 31.1 pg (ref 26.0–34.0)
MCHC: 32.9 g/dL (ref 30.0–36.0)
MCV: 94.6 fL (ref 80.0–100.0)
Monocytes Absolute: 1.4 10*3/uL — ABNORMAL HIGH (ref 0.1–1.0)
Monocytes Relative: 6 %
Neutro Abs: 21.9 10*3/uL — ABNORMAL HIGH (ref 1.7–7.7)
Neutrophils Relative %: 89 %
Platelets: 225 10*3/uL (ref 150–400)
RBC: 3.12 MIL/uL — ABNORMAL LOW (ref 4.22–5.81)
RDW: 15.4 % (ref 11.5–15.5)
WBC: 24.6 10*3/uL — ABNORMAL HIGH (ref 4.0–10.5)
nRBC: 0 % (ref 0.0–0.2)

## 2021-01-22 LAB — COMPREHENSIVE METABOLIC PANEL
ALT: 25 U/L (ref 0–44)
AST: 36 U/L (ref 15–41)
Albumin: 2.2 g/dL — ABNORMAL LOW (ref 3.5–5.0)
Alkaline Phosphatase: 97 U/L (ref 38–126)
Anion gap: 9 (ref 5–15)
BUN: 28 mg/dL — ABNORMAL HIGH (ref 8–23)
CO2: 26 mmol/L (ref 22–32)
Calcium: 6.6 mg/dL — ABNORMAL LOW (ref 8.9–10.3)
Chloride: 105 mmol/L (ref 98–111)
Creatinine, Ser: 1.77 mg/dL — ABNORMAL HIGH (ref 0.61–1.24)
GFR, Estimated: 34 mL/min — ABNORMAL LOW (ref >60)
Glucose, Bld: 124 mg/dL — ABNORMAL HIGH (ref 70–99)
Potassium: 4.4 mmol/L (ref 3.5–5.1)
Sodium: 140 mmol/L (ref 135–145)
Total Bilirubin: 1.9 mg/dL — ABNORMAL HIGH (ref 0.3–1.2)
Total Protein: 5.8 g/dL — ABNORMAL LOW (ref 6.5–8.1)

## 2021-01-22 LAB — MAGNESIUM: Magnesium: 1.4 mg/dL — ABNORMAL LOW (ref 1.7–2.4)

## 2021-01-22 MED ORDER — CALCIUM GLUCONATE-NACL 1-0.675 GM/50ML-% IV SOLN
1.0000 g | Freq: Once | INTRAVENOUS | Status: AC
  Administered 2021-01-22: 1000 mg via INTRAVENOUS
  Filled 2021-01-22: qty 50

## 2021-01-22 MED ORDER — CALCIUM CARBONATE ANTACID 500 MG PO CHEW
1.0000 | CHEWABLE_TABLET | Freq: Three times a day (TID) | ORAL | Status: DC
  Administered 2021-01-22 – 2021-01-25 (×9): 200 mg via ORAL
  Filled 2021-01-22 (×9): qty 1

## 2021-01-22 MED ORDER — IPRATROPIUM-ALBUTEROL 0.5-2.5 (3) MG/3ML IN SOLN
3.0000 mL | RESPIRATORY_TRACT | Status: DC | PRN
  Administered 2021-01-23 – 2021-01-24 (×3): 3 mL via RESPIRATORY_TRACT
  Filled 2021-01-22 (×3): qty 3

## 2021-01-22 MED ORDER — MAGNESIUM SULFATE 4 GM/100ML IV SOLN
4.0000 g | Freq: Once | INTRAVENOUS | Status: AC
  Administered 2021-01-22: 4 g via INTRAVENOUS
  Filled 2021-01-22: qty 100

## 2021-01-22 MED ORDER — FUROSEMIDE 10 MG/ML IJ SOLN
40.0000 mg | Freq: Once | INTRAMUSCULAR | Status: AC
  Administered 2021-01-22: 40 mg via INTRAVENOUS
  Filled 2021-01-22: qty 4

## 2021-01-22 NOTE — Progress Notes (Addendum)
Patient ID: Cody Mendoza, male   DOB: Jul 10, 1921, 87 y.o.   MRN: [id number]        Consult received and chart reviewed. Patient is active with [facility]. Discussed with [place] liaison Farrel Gordon RN. She states that goals of care seem clear and there is no need for PMT to be involved at this time. If this changes, please let us know.     Elie Confer, NP-C Palliative Medicine    Please call Palliative Medicine team phone with any questions [phone]. For individual providers please see AMION.   No charge

## 2021-01-22 NOTE — Progress Notes (Signed)
Patient ID: Cody Mendoza, male   DOB: May 04, 1921, 87 y.o.   MRN: [id number]  PROGRESS NOTE    KYAIRE GRUENEWALD  SHF:[id number] DOB: 12-11-1921 DOA: 01/20/2021 PCP: Crist Infante, MD   Brief Narrative:  87 y.o. male with medical history significant for spinal stenosis wheelchair bound, on hospice at home, paroxysmal atrial fibrillation on Eliquis, CVA, chronic diastolic heart failure, CKD stage II, and GERD presented with a fall and altered mental status.  Patient was confused on presentation and cannot provide any history.  On presentation, he was afebrile, normotensive and mildly tachycardic with WBCs of 11.7, potassium of 2.6, creatinine of 2.04 (remote baseline of around 1.3) calcium of 6 with albumin of 2.7.  CT of the head and cervical spine was negative.  Chest x-ray showed left lung base opacity with possible pneumonia.  Assessment & Plan:   Fall secondary to weakness Hypocalcemia Hypokalemia: Resolved Hypomagnesemia -Presented with fall and was found to have multiple electrolyte abnormalities?  Secondary to diarrhea.  Replace calcium and magnesium.  Repeat a.m. labs.  Acute metabolic encephalopathy Agitation/delirium -No known history of dementia but patient has required restraints. DC restraints as soon as able.  Use Haldol as needed. -Monitor mental status.  Fall precautions.  Fever Possible left basilar pneumonia Acute respiratory failure with hypoxia -Patient had a temperature spike this morning.  Chest x-ray on presentation showed possible left lung base opacity.  - UA was negative for UTI on presentation.   -Afebrile over the last 24 hours.  Continue Rocephin and Zithromax.  Strep pneumo urinary antigen negative.  Blood cultures negative so far. -SLP evaluation  AKI on CKD stage II -Creatinine 2.04 on presentation; remote baseline around 1.3.  Treated with IV fluids.  Creatinine 1.77 this morning.  Monitor  Mild leukocytosis -Worsened to 24.6 today.  Repeat a.m.  labs.  Paroxysmal A. Fib -Intermittently mildly tachycardic.  Continue Eliquis.  Continue metoprolol.  Chronic diastolic CHF -Continue gentle hydration.  Strict input output.  Daily weights.  Severe malnutrition--follow nutrition recommendations  Spinal stenosis, wheelchair-bound, on hospice at home -Outpatient hospice team following.  Inpatient palliative care consultation pending.  If condition does not improve, recommend total comfort measures  DVT prophylaxis: Eliquis Code Status: DNR Family Communication: None at bedside Disposition Plan: Status is: Inpatient  Remains inpatient appropriate because: Of confusion, electrolyte abnormalities, need for IV antibiotics  Consultants: Consult palliative care  Procedures: None  Antimicrobials: None   Subjective: Patient seen and examined at bedside.  No overnight fever, vomiting reported.  Extremely poor historian.  Objective: Vitals:   01/21/21 0900 01/21/21 1705 01/21/21 2103 01/21/21 2115  BP: 121/68 136/76 127/61   Pulse: 99 (!) 108 (!) 101   Resp: 16 18 20    Temp: 99 F (37.2 C) 99.3 F (37.4 C) 99.8 F (37.7 C)   TempSrc: Axillary Oral Oral   SpO2: 98% 100% 100%   Weight:    70.3 kg  Height:        Intake/Output Summary (Last 24 hours) at 01/22/2021 0724 Last data filed at 01/22/2021 0100 Gross per 24 hour  Intake 380 ml  Output 251 ml  Net 129 ml    Filed Weights   01/20/21 1810 01/21/21 2115  Weight: 74.5 kg 70.3 kg    Examination:  General exam: Elderly male lying in bed.  No acute distress.   Respiratory system: Decreased breath sounds at bases bilaterally with some crackles  cardiovascular system: Intermittent tachycardia present; S1-S2 heard gastrointestinal system: Abdomen is  distended slightly, soft and nontender.  Bowel sounds are heard  extremities: Mild lower extremity edema present; no clubbing  Central nervous system: Awake this morning; still very slow to respond to questions; hardly  answers any questions. No focal neurological deficits.  Moves extremities.   Skin: No obvious ecchymosis/rashes  psychiatry: Cannot assess because of patient being a poor historian  Data Reviewed: I have personally reviewed following labs and imaging studies  CBC: Recent Labs  Lab 01/20/21 1804 01/22/21 0347  WBC 11.7* 24.6*  NEUTROABS 8.7* 21.9*  HGB 10.0* 9.7*  HCT 30.8* 29.5*  MCV 94.2 94.6  PLT 256 027    Basic Metabolic Panel: Recent Labs  Lab 01/20/21 1804 01/20/21 2147 01/21/21 0351 01/22/21 0347  NA 138 139 139 140  K 2.6* 2.8* 3.5 4.4  CL 96* 97* 100 105  CO2 29 28 26 26   GLUCOSE 124* 128* 118* 124*  BUN 31* 29* 28* 28*  CREATININE 2.04* 1.96* 1.80* 1.77*  CALCIUM 6.0* 6.2* 6.1* 6.6*  MG  --  0.6* 1.1* 1.4*    GFR: Estimated Creatinine Clearance: 22.3 mL/min (A) (by C-G formula based on SCr of 1.77 mg/dL (H)). Liver Function Tests: Recent Labs  Lab 01/20/21 1804 01/22/21 0347  AST 37 36  ALT 23 25  ALKPHOS 109 97  BILITOT 2.0* 1.9*  PROT 6.5 5.8*  ALBUMIN 2.7* 2.2*    No results for input(s): LIPASE, AMYLASE in the last 168 hours. No results for input(s): AMMONIA in the last 168 hours. Coagulation Profile: No results for input(s): INR, PROTIME in the last 168 hours. Cardiac Enzymes: No results for input(s): CKTOTAL, CKMB, CKMBINDEX, TROPONINI in the last 168 hours. BNP (last 3 results) No results for input(s): PROBNP in the last 8760 hours. HbA1C: No results for input(s): HGBA1C in the last 72 hours. CBG: No results for input(s): GLUCAP in the last 168 hours. Lipid Profile: No results for input(s): CHOL, HDL, LDLCALC, TRIG, CHOLHDL, LDLDIRECT in the last 72 hours. Thyroid Function Tests: No results for input(s): TSH, T4TOTAL, FREET4, T3FREE, THYROIDAB in the last 72 hours. Anemia Panel: No results for input(s): VITAMINB12, FOLATE, FERRITIN, TIBC, IRON, RETICCTPCT in the last 72 hours. Sepsis Labs: Recent Labs  Lab 01/20/21 2147   PROCALCITON 0.23     Recent Results (from the past 240 hour(s))  Resp Panel by RT-PCR (Flu A&B, Covid) Nasopharyngeal Swab     Status: None   Collection Time: 01/20/21  9:09 PM   Specimen: Nasopharyngeal Swab; Nasopharyngeal(NP) swabs in vial transport medium  Result Value Ref Range Status   SARS Coronavirus 2 by RT PCR NEGATIVE NEGATIVE Final    Comment: (NOTE) SARS-CoV-2 target nucleic acids are NOT DETECTED.  The SARS-CoV-2 RNA is generally detectable in upper respiratory specimens during the acute phase of infection. The lowest concentration of SARS-CoV-2 viral copies this assay can detect is 138 copies/mL. A negative result does not preclude SARS-Cov-2 infection and should not be used as the sole basis for treatment or other patient management decisions. A negative result may occur with  improper specimen collection/handling, submission of specimen other than nasopharyngeal swab, presence of viral mutation(s) within the areas targeted by this assay, and inadequate number of viral copies(<138 copies/mL). A negative result must be combined with clinical observations, patient history, and epidemiological information. The expected result is Negative.  Fact Sheet for Patients:  EntrepreneurPulse.com.au  Fact Sheet for Healthcare Providers:  IncredibleEmployment.be  This test is no t yet approved or cleared by the [place]  States FDA and  has been authorized for detection and/or diagnosis of SARS-CoV-2 by FDA under an Emergency Use Authorization (EUA). This EUA will remain  in effect (meaning this test can be used) for the duration of the COVID-19 declaration under Section 564(b)(1) of the Act, 21 U.S.C.section 360bbb-3(b)(1), unless the authorization is terminated  or revoked sooner.       Influenza A by PCR NEGATIVE NEGATIVE Final   Influenza B by PCR NEGATIVE NEGATIVE Final    Comment: (NOTE) The Xpert Xpress SARS-CoV-2/FLU/RSV plus  assay is intended as an aid in the diagnosis of influenza from Nasopharyngeal swab specimens and should not be used as a sole basis for treatment. Nasal washings and aspirates are unacceptable for Xpert Xpress SARS-CoV-2/FLU/RSV testing.  Fact Sheet for Patients: EntrepreneurPulse.com.au  Fact Sheet for Healthcare Providers: IncredibleEmployment.be  This test is not yet approved or cleared by the [place] FDA and has been authorized for detection and/or diagnosis of SARS-CoV-2 by FDA under an Emergency Use Authorization (EUA). This EUA will remain in effect (meaning this test can be used) for the duration of the COVID-19 declaration under Section 564(b)(1) of the Act, 21 U.S.C. section 360bbb-3(b)(1), unless the authorization is terminated or revoked.  Performed at [facility], [place] [address] [place], [place] [address]   Gastrointestinal Panel by PCR , Stool     Status: None   Collection Time: 01/20/21 10:16 PM   Specimen: Stool  Result Value Ref Range Status   Campylobacter species NOT DETECTED NOT DETECTED Final   Plesimonas shigelloides NOT DETECTED NOT DETECTED Final   Salmonella species NOT DETECTED NOT DETECTED Final   Yersinia enterocolitica NOT DETECTED NOT DETECTED Final   Vibrio species NOT DETECTED NOT DETECTED Final   Vibrio cholerae NOT DETECTED NOT DETECTED Final   Enteroaggregative E coli (EAEC) NOT DETECTED NOT DETECTED Final   Enteropathogenic E coli (EPEC) NOT DETECTED NOT DETECTED Final   Enterotoxigenic E coli (ETEC) NOT DETECTED NOT DETECTED Final   Shiga like toxin producing E coli (STEC) NOT DETECTED NOT DETECTED Final   Shigella/Enteroinvasive E coli (EIEC) NOT DETECTED NOT DETECTED Final   Cryptosporidium NOT DETECTED NOT DETECTED Final   Cyclospora cayetanensis NOT DETECTED NOT DETECTED Final   Entamoeba histolytica NOT DETECTED NOT DETECTED Final   Giardia lamblia NOT DETECTED NOT DETECTED Final    Adenovirus F40/41 NOT DETECTED NOT DETECTED Final   Astrovirus NOT DETECTED NOT DETECTED Final   Norovirus GI/GII NOT DETECTED NOT DETECTED Final   Rotavirus A NOT DETECTED NOT DETECTED Final   Sapovirus (I, II, IV, and V) NOT DETECTED NOT DETECTED Final    Comment: Performed at [facility], [address] [address] [place], [place] [address]  MRSA Next Gen by PCR, Nasal     Status: None   Collection Time: 01/21/21  5:11 AM   Specimen: Nasal Swab  Result Value Ref Range Status   MRSA by PCR Next Gen NOT DETECTED NOT DETECTED Final    Comment: (NOTE) The GeneXpert MRSA Assay (FDA approved for NASAL specimens only), is one component of a comprehensive MRSA colonization surveillance program. It is not intended to diagnose MRSA infection nor to guide or monitor treatment for MRSA infections. Test performance is not FDA approved in patients less than 68 years old. Performed at [facility], [place] [address] [place], [place] [address]           Radiology Studies: CT Head Wo Contrast  Result Date: 01/20/2021 CLINICAL  DATA:  Fall.  Head injury EXAM: CT HEAD WITHOUT CONTRAST CT CERVICAL SPINE WITHOUT CONTRAST TECHNIQUE: Multidetector CT imaging of the head and cervical spine was performed following the standard protocol without intravenous contrast. Multiplanar CT image reconstructions of the cervical spine were also generated. COMPARISON:  CT head 06/08/2018 FINDINGS: CT HEAD FINDINGS Brain: Negative for acute hemorrhage. Negative for acute infarct or mass Moderate atrophy and moderate chronic microvascular ischemic change, similar to the prior study. Vascular: Negative for hyperdense vessel Skull: Negative Sinuses/Orbits: Mild mucosal edema paranasal sinuses. Bilateral cataract extraction Other: None CT CERVICAL SPINE FINDINGS Alignment: Mild anterolisthesis C5-6 Skull base and vertebrae: Negative for fracture Soft tissues and spinal canal: No acute abnormality. Atherosclerotic  calcification. Disc levels: Disc degeneration and facet degeneration throughout the cervical spine. Foraminal stenosis bilaterally due to spurring. Upper chest: Lung apices clear bilaterally Other: None IMPRESSION: 1. Atrophy and chronic microvascular ischemic change. No acute intracranial abnormality. 2. Negative for cervical spine fracture.  Multilevel spondylosis. Electronically Signed   By: Franchot Gallo M.D.   On: 01/20/2021 18:50   CT Cervical Spine Wo Contrast  Result Date: 01/20/2021 CLINICAL DATA:  Fall.  Head injury EXAM: CT HEAD WITHOUT CONTRAST CT CERVICAL SPINE WITHOUT CONTRAST TECHNIQUE: Multidetector CT imaging of the head and cervical spine was performed following the standard protocol without intravenous contrast. Multiplanar CT image reconstructions of the cervical spine were also generated. COMPARISON:  CT head 06/08/2018 FINDINGS: CT HEAD FINDINGS Brain: Negative for acute hemorrhage. Negative for acute infarct or mass Moderate atrophy and moderate chronic microvascular ischemic change, similar to the prior study. Vascular: Negative for hyperdense vessel Skull: Negative Sinuses/Orbits: Mild mucosal edema paranasal sinuses. Bilateral cataract extraction Other: None CT CERVICAL SPINE FINDINGS Alignment: Mild anterolisthesis C5-6 Skull base and vertebrae: Negative for fracture Soft tissues and spinal canal: No acute abnormality. Atherosclerotic calcification. Disc levels: Disc degeneration and facet degeneration throughout the cervical spine. Foraminal stenosis bilaterally due to spurring. Upper chest: Lung apices clear bilaterally Other: None IMPRESSION: 1. Atrophy and chronic microvascular ischemic change. No acute intracranial abnormality. 2. Negative for cervical spine fracture.  Multilevel spondylosis. Electronically Signed   By: Franchot Gallo M.D.   On: 01/20/2021 18:50   DG Chest Port 1 View  Result Date: 01/20/2021 CLINICAL DATA:  Fatigue.  Fall. EXAM: PORTABLE CHEST 1 VIEW  COMPARISON:  Chest radiograph 08/15/2019 CT 08/23/2018 FINDINGS: Lung volumes are low. Stable cardiomegaly. Aortic atherosclerosis. Left lung base opacity is greatest in the periphery. Mild chronic interstitial coarsening. No pneumothorax. No significant pleural effusion. No acute osseous abnormalities are seen. IMPRESSION: 1. Peripheral opacity at the left lung base. This may represent pneumonia in the appropriate clinical setting. Appearance is unusual for pulmonary contusion 2. Stable cardiomegaly. Aortic Atherosclerosis (ICD10-I70.0). Electronically Signed   By: Keith Rake M.D.   On: 01/20/2021 18:14        Scheduled Meds:  apixaban  2.5 mg Oral BID   feeding supplement  237 mL Oral TID BM   metoprolol succinate  50 mg Oral Daily   multivitamin with minerals  1 tablet Oral Daily   Continuous Infusions:  0.9 % NaCl with KCl 40 mEq / L 50 mL/hr at 01/21/21 1212   azithromycin 500 mg (01/21/21 1454)   cefTRIAXone (ROCEPHIN)  IV 1 g (01/21/21 1223)          Aline August, MD [facility] 01/22/2021, 7:24 AM

## 2021-01-22 NOTE — Evaluation (Addendum)
Clinical/Bedside Swallow Evaluation Patient Details  Name: Cody Mendoza MRN: [id number] Date of Birth: 13-Aug-1921  Today's Date: 01/22/2021 Time: SLP Start Time (ACUTE ONLY): 50 SLP Stop Time (ACUTE ONLY): 38 SLP Time Calculation (min) (ACUTE ONLY): 10 min  Past Medical History:  Past Medical History:  Diagnosis Date   AF (atrial fibrillation) (HCC)    Asthma    Detached retina    Discitis of thoracic region 08/2018   Diverticulosis    GERD (gastroesophageal reflux disease)    Rosanna [place] syndrome    Hypertension    Lactose intolerance    Mitral insufficiency    Renal calculi    TIA (transient ischemic attack)    Past Surgical History:  Past Surgical History:  Procedure Laterality Date   cataract surgery     ESOPHAGEAL DILATION     In the past   [place] STONE SURGERY     Status post syrgical removal of a    TONSILLECTOMY     HPI:  87 y.o. male  presented with a fall and altered mental status.  Patient was confused on presentation and cannot provide any history.  On presentation, he was afebrile, normotensive and mildly tachycardic with WBCs of 11.7, potassium of 2.6, creatinine of 2.04 (remote baseline of around 1.3) calcium of 6 with albumin of 2.7.  CT of the head and cervical spine was negative.  Chest x-ray showed left lung base opacity with possible pneumonia. with medical history significant for spinal stenosis wheelchair bound, on hospice at home, paroxysmal atrial fibrillation on Eliquis, CVA, chronic diastolic heart failure, CKD stage II, and GERD.    Assessment / Plan / Recommendation  Clinical Impression  Pt presents with possible indicators of esophageal v pharyngeal dysphagia.  With initial cup sip of thin liquid there was immediate throat clear, but pt tolerated addition trials including serial straw sips without clinical s/s of aspiration.  Pt is very hard of hearing and could not answer questions  regarding his swallow function 2/2 HI.   Pt tolerated puree without any clincal s/s of aspiration and exhibited good oral clearance.  With regular solid there was oral residue which was reduced with liquid wash. There was delayed belching noted with throat clearing after at end of evaluation.  Pt with hx of GERD.  This could indicate possible esophageal backflow.     Recommend continuing regular texture diet with thin liquids.  If there is specific concern for silent aspiration given abnormal CXR, please place order for MBSS.  Consider evaluation for esophageal dysphagia.    SLP Visit Diagnosis: Dysphagia, oropharyngeal phase (R13.12)    Aspiration Risk  Mild aspiration risk    Diet Recommendation Regular;Thin liquid   Liquid Administration via: Cup;Straw Medication Administration: Whole meds with liquid (as tolerated) Supervision: Patient able to self feed Compensations: Slow rate;Small sips/bites Postural Changes: Seated upright at 90 degrees    Other  Recommendations Recommended Consults: Consider esophageal assessment Oral Care Recommendations: Oral care BID    Recommendations for follow up therapy are one component of a multi-disciplinary discharge planning process, led by the attending physician.  Recommendations may be updated based on patient status, additional functional criteria and insurance authorization.  Follow up Recommendations No SLP follow up      Assistance Recommended at Discharge None  Functional Status Assessment Patient has not had a recent decline in their functional status  Frequency and Duration 1x week for  1 week         Prognosis Prognosis for Safe Diet Advancement:  (N/A)      Swallow Study   General Date of Onset: 01/20/21 HPI: 87 y.o. male  presented with a fall and altered mental status.  Patient was confused on presentation and cannot provide any history.  On presentation, he was afebrile, normotensive and mildly tachycardic with WBCs of 11.7,  potassium of 2.6, creatinine of 2.04 (remote baseline of around 1.3) calcium of 6 with albumin of 2.7.  CT of the head and cervical spine was negative.  Chest x-ray showed left lung base opacity with possible pneumonia. with medical history significant for spinal stenosis wheelchair bound, on hospice at home, paroxysmal atrial fibrillation on Eliquis, CVA, chronic diastolic heart failure, CKD stage II, and GERD. Type of Study: Bedside Swallow Evaluation Previous Swallow Assessment: None Diet Prior to this Study: Regular Temperature Spikes Noted: No Respiratory Status: Nasal cannula History of Recent Intubation: No Behavior/Cognition: Alert;Cooperative;Pleasant mood Oral Cavity Assessment: Within Functional Limits Oral Care Completed by SLP: No Oral Cavity - Dentition: Adequate natural dentition Vision: Functional for self-feeding Self-Feeding Abilities: Able to feed self Patient Positioning: Upright in bed Baseline Vocal Quality: Normal Volitional Cough:  (Fair) Volitional Swallow: Unable to elicit (2/2 HI)    Oral/Motor/Sensory Function Overall Oral Motor/Sensory Function: Mild impairment Facial ROM: Within Functional Limits Facial Symmetry: Within Functional Limits Lingual ROM: Within Functional Limits (reduced protrusion) Lingual Symmetry: Within Functional Limits Lingual Strength: Reduced Velum: Within Functional Limits Mandible: Within Functional Limits   Ice Chips Ice chips: Not tested   Thin Liquid Thin Liquid: Impaired Pharyngeal  Phase Impairments: Throat Clearing - Delayed;Throat Clearing - Immediate    Nectar Thick Nectar Thick Liquid: Not tested   Honey Thick Honey Thick Liquid: Not tested   Puree Puree: Within functional limits Presentation: Spoon   Solid     Solid: Impaired Presentation: Self Fed Oral Phase Functional Implications: Oral residue      Celedonio Savage, MA, [place] Office: [phone] 01/22/2021,10:59 AM

## 2021-01-22 NOTE — Progress Notes (Signed)
[name]) hospitalized hospice patient visit.  This is a current [facility] hospice patient from [place] facility with a hospice diagnosis of chronic diastolic heart failure.   Patient experienced a fall in the bathroom and hit his head. Facility contact family, EMS was activated and patient was admitted to [facility] on 01/20/21 with acute metabolic encephalopathy. Per Dr. Gildardo Cranker with [facility], this is a related admission.   Visited patient at bedside. Patient was sleeping, respiration even but labored with accessory muscle use noted.  Palpated HR 94-100, RR 24. Pt did not open eyes during assessment.  Report exchanged with bedside nursing team who report pt did engage appropriately this morning, did eat breakfast.  Spoke with both daughter and son by phone who share concerns for hospital-induced delirium and wishes for pt to discharge home as soon as medically optimal.  Son [name] asks to speak with hospitalist; Dr. Starla Link made aware via secure chat.  This patient is appropriate for inpatient GIP level of care due to need to for IV fluids and IV antibiotics.   VS: 98.14F oral, 124/75, HR 98, RR 24, 100% 4L [place] I/O: 1630/250  Abnormal labs:  BUN 28, Creat 1.77, GFR 34, Ca 6.6, Mg 1.4, Alb 2.2, tot protein 5.8, WBC 24.6, Hgb 9.7, NEUT# 21.9, lymp$0.6, monocyte# 1.4, Abds immat gran 0.66   Diagnostics: none new  IV/PRN Medications: Calcium gluconate 1g PIV x2; NS KCL 11mEq @ 12ml/hr, mag sulfate 2g PIV x3; Zithromax 500mg  IVPB QD, Rocephin 1g IVPB QD, metoprolol 2.5 mg PIV q6h,Tylenol 650 mg suppository prn @ 0707  Problem List: Acute metabolic encephalopathy Agitation/delirium -No known history of dementia but patient has required restraints. DC restraints as soon as able.  Use Haldol as needed. -Monitor mental status.  Fall precautions.   Fever Possible left basilar pneumonia Acute respiratory failure with hypoxia -Patient had a temperature spike this morning.  Chest  x-ray on presentation showed possible left lung base opacity.  - UA was negative for UTI on presentation.   -Afebrile over the last 24 hours.  Continue Rocephin and Zithromax.  Strep pneumo urinary antigen negative.  Blood cultures negative so far. -SLP evaluation   AKI on CKD stage II -Creatinine 2.04 on presentation; remote baseline around 1.3.  Treated with IV fluids.  Creatinine 1.77 this morning.  Monitor   Mild leukocytosis -Worsened to 24.6 today.  Repeat a.m. labs.    Discharge planning: Return to [place] after discharge with hospice services  Family Contact: Spoke with daughter and son by phone  IDG: team updated  Goals of Care: DNR, per discussion with Dr. Starla Link will open discussion of comfort care measures after Dr. Starla Link speaks with son  If ambulance transport is needed at discharge please use [facility].  Please do not hesitate to call with questions.   Thank you for the opportunity to participate in this pt's care.  Domenic Moras, BSN, RN   [facility] Liaison   [phone]

## 2021-01-22 NOTE — TOC Initial Note (Signed)
Transition of Care [facility]) - Initial/Assessment Note    Patient Details  Name: Cody Mendoza MRN: [id number] Date of Birth: September 25, 1921  Transition of Care [facility]) CM/SW Contact:    Tom-Johnson, Renea Ee, RN Phone Number: 01/22/2021, 3:57 PM  Clinical Narrative:                 CM received a call from [place] with Hospice. States patient is active with [facility] hospice services at [facility]. States patient was not supposed to have come to the hospital after his fall as he would have been treated by hospice at his apartment. CM went to speak with patient at bedside. Patient did not open eyes and did not answer when called. Chrislyn states she spoke with patient's children and are requesting to speak with MD and patient be transferred back to [facility] as soon as possible to prevent hospital delirium. CM will continue to follow with needs.  Expected Discharge Plan: [facility] ([facility]) Barriers to Discharge: Continued Medical Work up   Patient Goals and CMS Choice Patient states their goals for this hospitalization and ongoing recovery are:: Toreturn to ALF with hospice CMS Medicare.gov Compare Post Acute Care list provided to:: Patient    Expected Discharge Plan and Services Expected Discharge Plan: [place] ([facility])   Discharge Planning Services: CM Consult Post Acute Care Choice: Hospice Living arrangements for the past 2 months: [place]                                      Prior Living Arrangements/Services Living arrangements for the past 2 months: [place] Lives with:: Facility Resident Patient language and need for interpreter reviewed:: Yes Do you feel safe going back to the place where you live?: Yes      Need for Family Participation in Patient Care: Yes (Comment) Care giver support system in place?: Yes (comment)   Criminal Activity/Legal Involvement  Pertinent to Current Situation/Hospitalization: No - Comment as needed  Activities of Daily Living Home Assistive Devices/Equipment: Wheelchair ADL Screening (condition at time of admission) Patient's cognitive ability adequate to safely complete daily activities?: No Is the patient deaf or have difficulty hearing?: Yes Does the patient have difficulty seeing, even when wearing glasses/contacts?: Yes Does the patient have difficulty concentrating, remembering, or making decisions?: Yes Patient able to express need for assistance with ADLs?: No Does the patient have difficulty dressing or bathing?: Yes Independently performs ADLs?: No Does the patient have difficulty walking or climbing stairs?: Yes Weakness of Legs: Both Weakness of Arms/Hands: Both  Permission Sought/Granted Permission sought to share information with : Case Manager, Customer service manager, Family Supports Permission granted to share information with : Yes, Verbal Permission Granted              Emotional Assessment Appearance:: Appears stated age Attitude/Demeanor/Rapport: Engaged Affect (typically observed): Accepting, Appropriate, Calm, Hopeful Orientation: : Oriented to Self, Oriented to Place, Oriented to  Time, Oriented to Situation Alcohol / Substance Use: Not Applicable Psych Involvement: No (comment)  Admission diagnosis:  Hypokalemia [E87.6] Hypocalcemia [E83.51] Patient Active Problem List   Diagnosis Date Noted   Hypocalcemia 01/20/2021   Acute kidney injury superimposed on chronic kidney disease ([place]) 01/20/2021   Leukocytosis 01/20/2021   Hypertensive heart failure ([place]) 09/23/2019   Localized edema 09/23/2019   Chronic diastolic heart failure ([place]) 08/26/2019  Goals of care, counseling/discussion    DNR (do not resuscitate)    Palliative care by specialist    Hypokalemia 08/11/2019   Acute exacerbation of congestive heart failure ([place]) 08/10/2019   Acute CHF (congestive heart  failure) ([place]) 06/26/2019   Excessive cerumen in both ear canals 06/10/2019   Sensorineural hearing loss (SNHL), bilateral 06/10/2019   Gross hematuria 05/07/2019   Thrombophilia ([place]) 05/02/2019   Bilateral back pain 02/11/2019   Lumbar spondylosis 02/11/2019   Disorder of musculoskeletal system 01/24/2019   Pain in thoracic spine 01/24/2019   Tinea unguium 10/29/2018   Lower extremity edema 09/13/2018   Chronic osteomyelitis ([place]) 09/05/2018   Diarrhea 09/05/2018   Near syncope 08/28/2018   Hyponatremia 08/28/2018   TIA (transient ischemic attack) 08/27/2018   Discitis of thoracic region 08/23/2018   CKD (chronic kidney disease) stage 2, GFR 60-89 ml/min 08/23/2018   Chronic anemia 08/23/2018   Discitis 08/23/2018   Cough 12/18/2017   Pneumonia 12/18/2017   Acute upper respiratory infection 12/06/2017   Abnormal gait 11/10/2017   Elevated PSA 11/10/2017   Actinic keratosis 09/18/2017   Overactive bladder 06/16/2017   Lactose intolerance 01/25/2017   Asymptomatic microscopic hematuria 09/30/2016   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   Hypo-osmolality and hyponatremia 09/30/2016   Disequilibrium 11/13/2015   Abnormal results of thyroid function studies 07/03/2014   Hearing loss 05/28/2014   Puncture wound of right lower leg without foreign body 10/08/2013   Flatulence, eructation and gas pain 03/03/2013   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   Benign prostatic hyperplasia without lower urinary tract symptoms 05/16/2012   Proteinuria 06/01/2011   Pain in unspecified knee 05/02/2011   Benign paroxysmal positional vertigo 01/29/2011   Mitral insufficiency 12/28/2010   Atrial fibrillation ([place]) 11/16/2010   Hypertension    Testicular hypofunction 07/22/2010   GERD 04/22/2010   PERSONAL HX COLONIC POLYPS 04/22/2010   Bradycardia 03/15/2010   Asthma without status asthmaticus 02/25/2009   ED (erectile dysfunction) of organic origin 02/25/2009   Radiculopathy 02/25/2009    PCP:  Crist Infante, MD Pharmacy:   [place], [place] [address] [place] [address] Phone: [phone] Fax: [place], [place] [address] [place] [address] Phone: [phone] Fax: [phone]  [facility] [place], [place] [address] [place] [address] Phone: [phone] Fax: [phone]     Social Determinants of Health (SDOH) Interventions    Readmission Risk Interventions Readmission Risk Prevention Plan 08/12/2019  Transportation Screening Complete  Medication Review ([place]) Complete  PCP or Specialist appointment within 3-5 days of discharge Complete  HRI or [place] Complete  SW Recovery Care/Counseling Consult Complete  [place] Not Applicable  Some recent data might be hidden

## 2021-01-23 DIAGNOSIS — I480 Paroxysmal atrial fibrillation: Secondary | ICD-10-CM

## 2021-01-23 LAB — CBC WITH DIFFERENTIAL/PLATELET
Abs Immature Granulocytes: 0.77 10*3/uL — ABNORMAL HIGH (ref 0.00–0.07)
Basophils Absolute: 0 10*3/uL (ref 0.0–0.1)
Basophils Relative: 0 %
Eosinophils Absolute: 0 10*3/uL (ref 0.0–0.5)
Eosinophils Relative: 0 %
HCT: 27.5 % — ABNORMAL LOW (ref 39.0–52.0)
Hemoglobin: 8.9 g/dL — ABNORMAL LOW (ref 13.0–17.0)
Immature Granulocytes: 3 %
Lymphocytes Relative: 3 %
Lymphs Abs: 0.8 10*3/uL (ref 0.7–4.0)
MCH: 30.9 pg (ref 26.0–34.0)
MCHC: 32.4 g/dL (ref 30.0–36.0)
MCV: 95.5 fL (ref 80.0–100.0)
Monocytes Absolute: 1.3 10*3/uL — ABNORMAL HIGH (ref 0.1–1.0)
Monocytes Relative: 5 %
Neutro Abs: 21.5 10*3/uL — ABNORMAL HIGH (ref 1.7–7.7)
Neutrophils Relative %: 89 %
Platelets: 237 10*3/uL (ref 150–400)
RBC: 2.88 MIL/uL — ABNORMAL LOW (ref 4.22–5.81)
RDW: 15.5 % (ref 11.5–15.5)
WBC: 24.4 10*3/uL — ABNORMAL HIGH (ref 4.0–10.5)
nRBC: 0 % (ref 0.0–0.2)

## 2021-01-23 LAB — BASIC METABOLIC PANEL
Anion gap: 8 (ref 5–15)
BUN: 36 mg/dL — ABNORMAL HIGH (ref 8–23)
CO2: 27 mmol/L (ref 22–32)
Calcium: 7.6 mg/dL — ABNORMAL LOW (ref 8.9–10.3)
Chloride: 102 mmol/L (ref 98–111)
Creatinine, Ser: 1.89 mg/dL — ABNORMAL HIGH (ref 0.61–1.24)
GFR, Estimated: 32 mL/min — ABNORMAL LOW (ref >60)
Glucose, Bld: 128 mg/dL — ABNORMAL HIGH (ref 70–99)
Potassium: 4.5 mmol/L (ref 3.5–5.1)
Sodium: 137 mmol/L (ref 135–145)

## 2021-01-23 LAB — MAGNESIUM: Magnesium: 2.3 mg/dL (ref 1.7–2.4)

## 2021-01-23 MED ORDER — FUROSEMIDE 40 MG PO TABS
40.0000 mg | ORAL_TABLET | Freq: Every day | ORAL | Status: DC
  Administered 2021-01-23: 40 mg via ORAL
  Filled 2021-01-23 (×2): qty 1

## 2021-01-23 NOTE — Plan of Care (Signed)
  Problem: Health Behavior/Discharge Planning: Goal: Ability to manage health-related needs will improve Outcome: Not Progressing   

## 2021-01-23 NOTE — Progress Notes (Signed)
Patient ID: Cody Mendoza, male   DOB: September 21, 1921, 87 y.o.   MRN: [id number]  PROGRESS NOTE    Cody Mendoza  MGN:[id number] DOB: [birth year] DOA: 01/20/2021 PCP: Crist Infante, MD   Brief Narrative:  87 y.o. male with medical history significant for spinal stenosis wheelchair bound, on hospice at home, paroxysmal atrial fibrillation on Eliquis, CVA, chronic diastolic heart failure, CKD stage II, and GERD presented with a fall and altered mental status.  Patient was confused on presentation and cannot provide any history.  On presentation, he was afebrile, normotensive and mildly tachycardic with WBCs of 11.7, potassium of 2.6, creatinine of 2.04 (remote baseline of around 1.3) calcium of 6 with albumin of 2.7.  CT of the head and cervical spine was negative.  Chest x-ray showed left lung base opacity with possible pneumonia.  Assessment & Plan:   Fall secondary to weakness Hypocalcemia Hypokalemia: Resolved Hypomagnesemia -Presented with fall and was found to have multiple electrolyte abnormalities?  Secondary to diarrhea.   -Magnesium level normal today.  Calcium improving.  Continue oral calcium supplementation.  Acute metabolic encephalopathy Agitation/delirium -No known history of dementia but patient required restraints briefly.  Currently off restraints.  Use Haldol as needed. -Monitor mental status.  Fall precautions.  Fever Possible left basilar pneumonia Acute respiratory failure with hypoxia -Chest x-ray on presentation showed possible left lung base opacity.  - UA was negative for UTI on presentation.   -Afebrile over the last 24 hours.  Continue Rocephin and Zithromax.  Strep pneumo urinary antigen negative.  Blood cultures negative so far. -Diet as per SLP recommendations.  AKI on CKD stage II -Creatinine 2.04 on presentation; remote baseline around 1.3.  Treated with IV fluids.  IV fluids stopped on 01/22/2021.  Patient was given 1 dose of IV Lasix because of  wheezing and increased work of breathing on 01/23/2019.  Creatinine 1.89 today.  Will resume home dose of oral Lasix.  Mild leukocytosis -Still significant at 24.4 today.  Repeat a.m. labs.  Paroxysmal A. Fib -Intermittently mildly tachycardic.  Continue Eliquis.  Continue metoprolol.  Chronic diastolic CHF -Diuretic plan as above.  Strict input output.  Daily weights.  Severe malnutrition--follow nutrition recommendations  Spinal stenosis, wheelchair-bound, on hospice at home -Outpatient hospice team following.  Plan is to return to assisted living facility with hospice following.  DVT prophylaxis: Eliquis Code Status: DNR Family Communication: Spoke to son on phone on 01/22/2021 and 01/23/2021  disposition Plan: Status is: Inpatient  Remains inpatient appropriate because: Of confusion, electrolyte abnormalities, need for IV antibiotics  Consultants: Outpatient hospice team following Procedures: None  Antimicrobials: Rocephin and Zithromax from 01/21/2021 onwards  Subjective: Patient seen and examined at bedside.  Very poor historian.  No overnight agitation, seizures, fever reported.  Objective: Vitals:   01/22/21 2116 01/23/21 0000 01/23/21 0100 01/23/21 0452  BP: 120/70   115/74  Pulse: (!) 113 88  79  Resp: 16   19  Temp: 98.4 F (36.9 C)   98.7 F (37.1 C)  TempSrc: Oral   Oral  SpO2: 98%   100%  Weight:   72.2 kg   Height:        Intake/Output Summary (Last 24 hours) at 01/23/2021 0946 Last data filed at 01/23/2021 0500 Gross per 24 hour  Intake 2610.06 ml  Output 675 ml  Net 1935.06 ml    Filed Weights   01/20/21 1810 01/21/21 2115 01/23/21 0100  Weight: 74.5 kg 70.3 kg 72.2 kg  Examination:  General exam: Currently on 2 L oxygen via nasal cannula.  Elderly male, looks deconditioned.  No distress.  Respiratory system: Bilateral decreased breath sounds at bases with some crackles  cardiovascular system: S1-S2 heard; intermittently tachycardic  gastrointestinal system: Abdomen is mildly distended, soft and nontender.  Normal bowel sounds heard extremities: No cyanosis; trace lower extremity edema present Central nervous system: More awake this morning, extremely slow to respond to questions.  No focal neurological deficits.  Moving extremities  skin: No obvious petechiae/lesions psychiatry: Extremely flat affect.  Hardly participates in any conversation.  Data Reviewed: I have personally reviewed following labs and imaging studies  CBC: Recent Labs  Lab 01/20/21 1804 01/22/21 0347 01/23/21 0326  WBC 11.7* 24.6* 24.4*  NEUTROABS 8.7* 21.9* 21.5*  HGB 10.0* 9.7* 8.9*  HCT 30.8* 29.5* 27.5*  MCV 94.2 94.6 95.5  PLT 256 225 638    Basic Metabolic Panel: Recent Labs  Lab 01/20/21 1804 01/20/21 2147 01/21/21 0351 01/22/21 0347 01/23/21 0326  NA 138 139 139 140 137  K 2.6* 2.8* 3.5 4.4 4.5  CL 96* 97* 100 105 102  CO2 29 28 26 26 27   GLUCOSE 124* 128* 118* 124* 128*  BUN 31* 29* 28* 28* 36*  CREATININE 2.04* 1.96* 1.80* 1.77* 1.89*  CALCIUM 6.0* 6.2* 6.1* 6.6* 7.6*  MG  --  0.6* 1.1* 1.4* 2.3    GFR: Estimated Creatinine Clearance: 20.9 mL/min (A) (by C-G formula based on SCr of 1.89 mg/dL (H)). Liver Function Tests: Recent Labs  Lab 01/20/21 1804 01/22/21 0347  AST 37 36  ALT 23 25  ALKPHOS 109 97  BILITOT 2.0* 1.9*  PROT 6.5 5.8*  ALBUMIN 2.7* 2.2*    No results for input(s): LIPASE, AMYLASE in the last 168 hours. No results for input(s): AMMONIA in the last 168 hours. Coagulation Profile: No results for input(s): INR, PROTIME in the last 168 hours. Cardiac Enzymes: No results for input(s): CKTOTAL, CKMB, CKMBINDEX, TROPONINI in the last 168 hours. BNP (last 3 results) No results for input(s): PROBNP in the last 8760 hours. HbA1C: No results for input(s): HGBA1C in the last 72 hours. CBG: No results for input(s): GLUCAP in the last 168 hours. Lipid Profile: No results for input(s): CHOL, HDL,  LDLCALC, TRIG, CHOLHDL, LDLDIRECT in the last 72 hours. Thyroid Function Tests: No results for input(s): TSH, T4TOTAL, FREET4, T3FREE, THYROIDAB in the last 72 hours. Anemia Panel: No results for input(s): VITAMINB12, FOLATE, FERRITIN, TIBC, IRON, RETICCTPCT in the last 72 hours. Sepsis Labs: Recent Labs  Lab 01/20/21 2147  PROCALCITON 0.23     Recent Results (from the past 240 hour(s))  Resp Panel by RT-PCR (Flu A&B, Covid) Nasopharyngeal Swab     Status: None   Collection Time: 01/20/21  9:09 PM   Specimen: Nasopharyngeal Swab; Nasopharyngeal(NP) swabs in vial transport medium  Result Value Ref Range Status   SARS Coronavirus 2 by RT PCR NEGATIVE NEGATIVE Final    Comment: (NOTE) SARS-CoV-2 target nucleic acids are NOT DETECTED.  The SARS-CoV-2 RNA is generally detectable in upper respiratory specimens during the acute phase of infection. The lowest concentration of SARS-CoV-2 viral copies this assay can detect is 138 copies/mL. A negative result does not preclude SARS-Cov-2 infection and should not be used as the sole basis for treatment or other patient management decisions. A negative result may occur with  improper specimen collection/handling, submission of specimen other than nasopharyngeal swab, presence of viral mutation(s) within the areas targeted by this  assay, and inadequate number of viral copies(<138 copies/mL). A negative result must be combined with clinical observations, patient history, and epidemiological information. The expected result is Negative.  Fact Sheet for Patients:  EntrepreneurPulse.com.au  Fact Sheet for Healthcare Providers:  IncredibleEmployment.be  This test is no t yet approved or cleared by the [place] FDA and  has been authorized for detection and/or diagnosis of SARS-CoV-2 by FDA under an Emergency Use Authorization (EUA). This EUA will remain  in effect (meaning this test can be used) for  the duration of the COVID-19 declaration under Section 564(b)(1) of the Act, 21 U.S.C.section 360bbb-3(b)(1), unless the authorization is terminated  or revoked sooner.       Influenza A by PCR NEGATIVE NEGATIVE Final   Influenza B by PCR NEGATIVE NEGATIVE Final    Comment: (NOTE) The Xpert Xpress SARS-CoV-2/FLU/RSV plus assay is intended as an aid in the diagnosis of influenza from Nasopharyngeal swab specimens and should not be used as a sole basis for treatment. Nasal washings and aspirates are unacceptable for Xpert Xpress SARS-CoV-2/FLU/RSV testing.  Fact Sheet for Patients: EntrepreneurPulse.com.au  Fact Sheet for Healthcare Providers: IncredibleEmployment.be  This test is not yet approved or cleared by the [place] FDA and has been authorized for detection and/or diagnosis of SARS-CoV-2 by FDA under an Emergency Use Authorization (EUA). This EUA will remain in effect (meaning this test can be used) for the duration of the COVID-19 declaration under Section 564(b)(1) of the Act, 21 U.S.C. section 360bbb-3(b)(1), unless the authorization is terminated or revoked.  Performed at [facility], [place] [address] [place], [place] [address]   Gastrointestinal Panel by PCR , Stool     Status: None   Collection Time: 01/20/21 10:16 PM   Specimen: Stool  Result Value Ref Range Status   Campylobacter species NOT DETECTED NOT DETECTED Final   Plesimonas shigelloides NOT DETECTED NOT DETECTED Final   Salmonella species NOT DETECTED NOT DETECTED Final   Yersinia enterocolitica NOT DETECTED NOT DETECTED Final   Vibrio species NOT DETECTED NOT DETECTED Final   Vibrio cholerae NOT DETECTED NOT DETECTED Final   Enteroaggregative E coli (EAEC) NOT DETECTED NOT DETECTED Final   Enteropathogenic E coli (EPEC) NOT DETECTED NOT DETECTED Final   Enterotoxigenic E coli (ETEC) NOT DETECTED NOT DETECTED Final   Shiga like toxin producing E coli  (STEC) NOT DETECTED NOT DETECTED Final   Shigella/Enteroinvasive E coli (EIEC) NOT DETECTED NOT DETECTED Final   Cryptosporidium NOT DETECTED NOT DETECTED Final   Cyclospora cayetanensis NOT DETECTED NOT DETECTED Final   Entamoeba histolytica NOT DETECTED NOT DETECTED Final   Giardia lamblia NOT DETECTED NOT DETECTED Final   Adenovirus F40/41 NOT DETECTED NOT DETECTED Final   Astrovirus NOT DETECTED NOT DETECTED Final   Norovirus GI/GII NOT DETECTED NOT DETECTED Final   Rotavirus A NOT DETECTED NOT DETECTED Final   Sapovirus (I, II, IV, and V) NOT DETECTED NOT DETECTED Final    Comment: Performed at [facility], [place] [place],  [address]  MRSA Next Gen by PCR, Nasal     Status: None   Collection Time: 01/21/21  5:11 AM   Specimen: Nasal Swab  Result Value Ref Range Status   MRSA by PCR Next Gen NOT DETECTED NOT DETECTED Final    Comment: (NOTE) The GeneXpert MRSA Assay (FDA approved for NASAL specimens only), is one component of a comprehensive MRSA colonization surveillance program. It is not intended to diagnose MRSA infection nor  to guide or monitor treatment for MRSA infections. Test performance is not FDA approved in patients less than 3 years old. Performed at [facility], [place] [address] [place], [place] [address]   Culture, blood (routine x 2)     Status: None (Preliminary result)   Collection Time: 01/21/21  8:31 AM   Specimen: BLOOD  Result Value Ref Range Status   Specimen Description BLOOD LEFT ANTECUBITAL  Final   Special Requests   Final    BOTTLES DRAWN AEROBIC AND ANAEROBIC Blood Culture adequate volume   Culture   Final    NO GROWTH 1 DAY Performed at [facility], [place] [address] [place], [place] [address]    Report Status PENDING  Incomplete  Culture, blood (routine x 2)     Status: None (Preliminary result)   Collection Time: 01/21/21  8:32 AM   Specimen: BLOOD RIGHT WRIST  Result Value Ref Range Status   Specimen  Description BLOOD RIGHT WRIST  Final   Special Requests   Final    BOTTLES DRAWN AEROBIC AND ANAEROBIC Blood Culture results may not be optimal due to an inadequate volume of blood received in culture bottles   Culture   Final    NO GROWTH 1 DAY Performed at [facility], [place] [address], [place], [place] [address]    Report Status PENDING  Incomplete          Radiology Studies: No results found.      Scheduled Meds:  apixaban  2.5 mg Oral BID   calcium carbonate  1 tablet Oral TID WC   feeding supplement  237 mL Oral TID BM   metoprolol succinate  50 mg Oral Daily   multivitamin with minerals  1 tablet Oral Daily   Continuous Infusions:  azithromycin 500 mg (01/22/21 1154)   cefTRIAXone (ROCEPHIN)  IV 1 g (01/22/21 1156)          Aline August, MD [facility] 01/23/2021, 9:46 AM

## 2021-01-23 NOTE — Plan of Care (Signed)

## 2021-01-23 NOTE — Progress Notes (Signed)
[name]) hospitalized hospice patient visit.   This is a current [facility] hospice patient from [facility] with a hospice diagnosis of chronic diastolic heart failure.   Patient experienced a fall in the bathroom and hit his head. Facility contact family, EMS was activated and patient was admitted to [facility] on 01/20/21 with acute metabolic encephalopathy. Per Dr. Gildardo Cranker with [facility], this is a related admission.    Visited with patient at bedside, he was sleeping and did not arouse to gentle verbal stimulation. Exchanged report with bedside nurse, patient appears comfortable. Per nurse d/c plans have not been finalized yet.   This patient is appropriate for inpatient GIP level of care due to need to for IV fluids and IV antibiotics.   VS: 97.8, 77, 18, 124/42, spO2 100% 4L [place] I/O: 880/100   Abnormal labs:   01/23/21 03:26 Glucose: 128 (H) BUN: 36 (H) Creatinine: 1.89 (H) Calcium: 7.6 (L) GFR, Estimated: 32 (L) WBC: 24.4 (H) RBC: 2.88 (L) Hemoglobin: 8.9 (L) HCT: 27.5 (L)  Diagnostics: none new   IV/PRN Medications: Calcium gluconate 1g PIV x2; NS KCL 77mEq @ 64ml/hr, mag sulfate 2g PIV x3; Zithromax 500mg  IVPB QD, Rocephin 1g IVPB QD, metoprolol 2.5 mg PIV q6h,Tylenol 650 mg suppository prn @ 0707   Problem List: Acute metabolic encephalopathy Agitation/delirium -No known history of dementia but patient required restraints briefly.  Currently off restraints.  Use Haldol as needed. -Monitor mental status.  Fall precautions.   Fever Possible left basilar pneumonia Acute respiratory failure with hypoxia -Chest x-ray on presentation showed possible left lung base opacity.  - UA was negative for UTI on presentation.   -Afebrile over the last 24 hours.  Continue Rocephin and Zithromax.  Strep pneumo urinary antigen negative.  Blood cultures negative so far. -Diet as per SLP recommendations.   AKI on CKD stage II -Creatinine 2.04 on  presentation; remote baseline around 1.3.  Treated with IV fluids.  IV fluids stopped on 01/22/2021.  Patient was given 1 dose of IV Lasix because of wheezing and increased work of breathing on 01/23/2019.  Creatinine 1.89 today.  Will resume home dose of oral Lasix.   Mild leukocytosis -Still significant at 24.4 today.  Repeat a.m. labs.   Paroxysmal A. Fib -Intermittently mildly tachycardic.  Continue Eliquis.  Continue metoprolol.   Chronic diastolic CHF -Diuretic plan as above.  Strict input output.  Daily weights.   Discharge planning: Return to [facility] after discharge with hospice services   Family Contact: VM left for son   IDG: team updated   Goals of Care: DNR, per discussion with Dr. Starla Link will open discussion of comfort care measures after Dr. Starla Link speaks with son   If ambulance transport is needed at discharge please use [facility].   Please do not hesitate to call with questions.   Thank you for the opportunity to participate in this pt's care. Jhonnie Garner, BSN, Therapist, sports, [facility] hospital liaison [phone]

## 2021-01-24 ENCOUNTER — Inpatient Hospital Stay (HOSPITAL_COMMUNITY)

## 2021-01-24 LAB — CBC WITH DIFFERENTIAL/PLATELET
Abs Immature Granulocytes: 0.53 10*3/uL — ABNORMAL HIGH (ref 0.00–0.07)
Basophils Absolute: 0 10*3/uL (ref 0.0–0.1)
Basophils Relative: 0 %
Eosinophils Absolute: 0 10*3/uL (ref 0.0–0.5)
Eosinophils Relative: 0 %
HCT: 29.2 % — ABNORMAL LOW (ref 39.0–52.0)
Hemoglobin: 9.5 g/dL — ABNORMAL LOW (ref 13.0–17.0)
Immature Granulocytes: 3 %
Lymphocytes Relative: 2 %
Lymphs Abs: 0.4 10*3/uL — ABNORMAL LOW (ref 0.7–4.0)
MCH: 31 pg (ref 26.0–34.0)
MCHC: 32.5 g/dL (ref 30.0–36.0)
MCV: 95.4 fL (ref 80.0–100.0)
Monocytes Absolute: 1.6 10*3/uL — ABNORMAL HIGH (ref 0.1–1.0)
Monocytes Relative: 8 %
Neutro Abs: 17.9 10*3/uL — ABNORMAL HIGH (ref 1.7–7.7)
Neutrophils Relative %: 87 %
Platelets: 258 10*3/uL (ref 150–400)
RBC: 3.06 MIL/uL — ABNORMAL LOW (ref 4.22–5.81)
RDW: 15.4 % (ref 11.5–15.5)
WBC: 20.6 10*3/uL — ABNORMAL HIGH (ref 4.0–10.5)
nRBC: 0 % (ref 0.0–0.2)

## 2021-01-24 LAB — BASIC METABOLIC PANEL
Anion gap: 7 (ref 5–15)
BUN: 38 mg/dL — ABNORMAL HIGH (ref 8–23)
CO2: 28 mmol/L (ref 22–32)
Calcium: 8.7 mg/dL — ABNORMAL LOW (ref 8.9–10.3)
Chloride: 105 mmol/L (ref 98–111)
Creatinine, Ser: 1.85 mg/dL — ABNORMAL HIGH (ref 0.61–1.24)
GFR, Estimated: 33 mL/min — ABNORMAL LOW (ref >60)
Glucose, Bld: 127 mg/dL — ABNORMAL HIGH (ref 70–99)
Potassium: 4.3 mmol/L (ref 3.5–5.1)
Sodium: 140 mmol/L (ref 135–145)

## 2021-01-24 LAB — LEGIONELLA PNEUMOPHILA SEROGP 1 UR AG: L. pneumophila Serogp 1 Ur Ag: NEGATIVE

## 2021-01-24 LAB — GLUCOSE, CAPILLARY
Glucose-Capillary: 147 mg/dL — ABNORMAL HIGH (ref 70–99)
Glucose-Capillary: 126 mg/dL — ABNORMAL HIGH (ref 70–99)

## 2021-01-24 LAB — MAGNESIUM: Magnesium: 2.2 mg/dL (ref 1.7–2.4)

## 2021-01-24 MED ORDER — FUROSEMIDE 10 MG/ML IJ SOLN
40.0000 mg | Freq: Once | INTRAMUSCULAR | Status: AC
Start: 2021-01-24 — End: 2021-01-24
  Administered 2021-01-24: 40 mg via INTRAVENOUS
  Filled 2021-01-24: qty 4

## 2021-01-24 MED ORDER — AZITHROMYCIN 500 MG PO TABS
500.0000 mg | ORAL_TABLET | Freq: Every day | ORAL | Status: AC
  Administered 2021-01-24 – 2021-01-25 (×2): 500 mg via ORAL
  Filled 2021-01-24 (×3): qty 1

## 2021-01-24 MED ORDER — SODIUM CHLORIDE 0.9 % IV SOLN
2.0000 g | INTRAVENOUS | Status: AC
  Administered 2021-01-24 – 2021-01-25 (×2): 2 g via INTRAVENOUS
  Filled 2021-01-24 (×2): qty 20

## 2021-01-24 NOTE — Progress Notes (Signed)
Patient ID: Cody Mendoza, male   DOB: 13-Nov-1921, 87 y.o.   MRN: [id number]  PROGRESS NOTE    Cody Mendoza  ZHG:[id number] DOB: April 22, 1921 DOA: 01/20/2021 PCP: Crist Infante, MD   Brief Narrative:  87 y.o. male with medical history significant for spinal stenosis wheelchair bound, on hospice at home, paroxysmal atrial fibrillation on Eliquis, CVA, chronic diastolic heart failure, CKD stage II, and GERD presented with a fall and altered mental status.  Patient was confused on presentation and cannot provide any history.  On presentation, he was afebrile, normotensive and mildly tachycardic with WBCs of 11.7, potassium of 2.6, creatinine of 2.04 (remote baseline of around 1.3) calcium of 6 with albumin of 2.7.  CT of the head and cervical spine was negative.  Chest x-ray showed left lung base opacity with possible pneumonia.  Assessment & Plan:   Fall secondary to weakness Hypocalcemia Hypokalemia: Resolved Hypomagnesemia -Presented with fall and was found to have multiple electrolyte abnormalities?  Secondary to diarrhea.   -Magnesium level normal today.  Calcium improving.  Continue oral calcium supplementation.  Acute metabolic encephalopathy Agitation/delirium -No known history of dementia but patient required restraints briefly.  Currently off restraints.  Use Haldol as needed.  Required a dose of Haldol last night. -Monitor mental status.  Fall precautions.  Fever Possible left basilar pneumonia Acute respiratory failure with hypoxia -Chest x-ray on presentation showed possible left lung base opacity.  - UA was negative for UTI on presentation.   -Afebrile over the last 24 hours.  Continue Rocephin and Zithromax.  Strep pneumo urinary antigen negative.  Blood cultures negative so far. -Diet as per SLP recommendations.  AKI on CKD stage II -Creatinine 2.04 on presentation; remote baseline around 1.3.  Treated with IV fluids.  IV fluids stopped on 01/22/2021.  Patient was  given 1 dose of IV Lasix because of wheezing and increased work of breathing on 01/23/2019.  Creatinine 1.85 today.   -We will give 1 more dose of IV Lasix today.  Mild leukocytosis -Still significant but improving at 20.6 today.  Repeat a.m. labs.  Paroxysmal A. Fib -Intermittently mildly bradycardic.  Continue Eliquis.  Continue metoprolol.  Chronic diastolic CHF -Diuretic plan as above.  Strict input output.  Daily weights.  Severe malnutrition--follow nutrition recommendations  Spinal stenosis, wheelchair-bound, on hospice at home -Outpatient hospice team following.  Plan is to return to assisted living facility with hospice following. -PT eval as per request by son.  DVT prophylaxis: Eliquis Code Status: DNR Family Communication: Spoke to son on phone on 01/24/2021 and agreed to give the patient 1 more day with possible discharge to ALF with hospice tomorrow.   Disposition Plan: Status is: Inpatient  Remains inpatient appropriate because: Of confusion, electrolyte abnormalities, need for IV antibiotics  Consultants: Outpatient hospice team following Procedures: None  Antimicrobials: Rocephin and Zithromax from 01/21/2021 onwards  Subjective: Patient seen and examined at bedside.  Very poor historian.  Awake, confused.  Nursing staff reports increasing wheezing yesterday and needed a dose of IV Haldol last night.  No overnight fever or vomiting reported. Objective: Vitals:   01/23/21 2028 01/24/21 0200 01/24/21 0521 01/24/21 0938  BP: (!) 119/51  (!) 124/45 (!) 121/53  Pulse: 87  83 (!) 49  Resp: (!) 24  19 19   Temp: 99.6 F (37.6 C)  98.4 F (36.9 C) 99.1 F (37.3 C)  TempSrc: Oral  Oral Oral  SpO2: 100%  100% 100%  Weight:  72.8 kg  Height:        Intake/Output Summary (Last 24 hours) at 01/24/2021 1014 Last data filed at 01/24/2021 0900 Gross per 24 hour  Intake 660 ml  Output 625 ml  Net 35 ml    Filed Weights   01/21/21 2115 01/23/21 0100 01/24/21  0200  Weight: 70.3 kg 72.2 kg 72.8 kg    Examination:  General exam: No acute distress.  On 2 to 4 L oxygen via nasal cannula.  Elderly male, looks deconditioned.    Respiratory system: Decreased breath sounds at bases bilaterally with scattered crackles  cardiovascular system: Intermittently bradycardic; S1-S2 heard gastrointestinal system: Abdomen is distended slightly, soft and nontender.  Bowel sounds are heard  extremities: Mild lower extremity edema present; no clubbing Central nervous system: Awake, confused, extremely slow to respond to questions.  No focal neurological deficits.  Moves extremities.   Skin: No obvious ecchymosis/rashes psychiatry: Flat affect.  Intermittently smiling  Data Reviewed: I have personally reviewed following labs and imaging studies  CBC: Recent Labs  Lab 01/20/21 1804 01/22/21 0347 01/23/21 0326 01/24/21 0430  WBC 11.7* 24.6* 24.4* 20.6*  NEUTROABS 8.7* 21.9* 21.5* 17.9*  HGB 10.0* 9.7* 8.9* 9.5*  HCT 30.8* 29.5* 27.5* 29.2*  MCV 94.2 94.6 95.5 95.4  PLT 256 225 237 962    Basic Metabolic Panel: Recent Labs  Lab 01/20/21 2147 01/21/21 0351 01/22/21 0347 01/23/21 0326 01/24/21 0430  NA 139 139 140 137 140  K 2.8* 3.5 4.4 4.5 4.3  CL 97* 100 105 102 105  CO2 28 26 26 27 28   GLUCOSE 128* 118* 124* 128* 127*  BUN 29* 28* 28* 36* 38*  CREATININE 1.96* 1.80* 1.77* 1.89* 1.85*  CALCIUM 6.2* 6.1* 6.6* 7.6* 8.7*  MG 0.6* 1.1* 1.4* 2.3 2.2    GFR: Estimated Creatinine Clearance: 21.3 mL/min (A) (by C-G formula based on SCr of 1.85 mg/dL (H)). Liver Function Tests: Recent Labs  Lab 01/20/21 1804 01/22/21 0347  AST 37 36  ALT 23 25  ALKPHOS 109 97  BILITOT 2.0* 1.9*  PROT 6.5 5.8*  ALBUMIN 2.7* 2.2*    No results for input(s): LIPASE, AMYLASE in the last 168 hours. No results for input(s): AMMONIA in the last 168 hours. Coagulation Profile: No results for input(s): INR, PROTIME in the last 168 hours. Cardiac Enzymes: No  results for input(s): CKTOTAL, CKMB, CKMBINDEX, TROPONINI in the last 168 hours. BNP (last 3 results) No results for input(s): PROBNP in the last 8760 hours. HbA1C: No results for input(s): HGBA1C in the last 72 hours. CBG: No results for input(s): GLUCAP in the last 168 hours. Lipid Profile: No results for input(s): CHOL, HDL, LDLCALC, TRIG, CHOLHDL, LDLDIRECT in the last 72 hours. Thyroid Function Tests: No results for input(s): TSH, T4TOTAL, FREET4, T3FREE, THYROIDAB in the last 72 hours. Anemia Panel: No results for input(s): VITAMINB12, FOLATE, FERRITIN, TIBC, IRON, RETICCTPCT in the last 72 hours. Sepsis Labs: Recent Labs  Lab 01/20/21 2147  PROCALCITON 0.23     Recent Results (from the past 240 hour(s))  Resp Panel by RT-PCR (Flu A&B, Covid) Nasopharyngeal Swab     Status: None   Collection Time: 01/20/21  9:09 PM   Specimen: Nasopharyngeal Swab; Nasopharyngeal(NP) swabs in vial transport medium  Result Value Ref Range Status   SARS Coronavirus 2 by RT PCR NEGATIVE NEGATIVE Final    Comment: (NOTE) SARS-CoV-2 target nucleic acids are NOT DETECTED.  The SARS-CoV-2 RNA is generally detectable in upper respiratory specimens during the acute  phase of infection. The lowest concentration of SARS-CoV-2 viral copies this assay can detect is 138 copies/mL. A negative result does not preclude SARS-Cov-2 infection and should not be used as the sole basis for treatment or other patient management decisions. A negative result may occur with  improper specimen collection/handling, submission of specimen other than nasopharyngeal swab, presence of viral mutation(s) within the areas targeted by this assay, and inadequate number of viral copies(<138 copies/mL). A negative result must be combined with clinical observations, patient history, and epidemiological information. The expected result is Negative.  Fact Sheet for Patients:  EntrepreneurPulse.com.au  Fact  Sheet for Healthcare Providers:  IncredibleEmployment.be  This test is no t yet approved or cleared by the [place] FDA and  has been authorized for detection and/or diagnosis of SARS-CoV-2 by FDA under an Emergency Use Authorization (EUA). This EUA will remain  in effect (meaning this test can be used) for the duration of the COVID-19 declaration under Section 564(b)(1) of the Act, 21 U.S.C.section 360bbb-3(b)(1), unless the authorization is terminated  or revoked sooner.       Influenza A by PCR NEGATIVE NEGATIVE Final   Influenza B by PCR NEGATIVE NEGATIVE Final    Comment: (NOTE) The Xpert Xpress SARS-CoV-2/FLU/RSV plus assay is intended as an aid in the diagnosis of influenza from Nasopharyngeal swab specimens and should not be used as a sole basis for treatment. Nasal washings and aspirates are unacceptable for Xpert Xpress SARS-CoV-2/FLU/RSV testing.  Fact Sheet for Patients: EntrepreneurPulse.com.au  Fact Sheet for Healthcare Providers: IncredibleEmployment.be  This test is not yet approved or cleared by the [place] FDA and has been authorized for detection and/or diagnosis of SARS-CoV-2 by FDA under an Emergency Use Authorization (EUA). This EUA will remain in effect (meaning this test can be used) for the duration of the COVID-19 declaration under Section 564(b)(1) of the Act, 21 U.S.C. section 360bbb-3(b)(1), unless the authorization is terminated or revoked.  Performed at [facility], [place] [address] [place], [place] [address]   Gastrointestinal Panel by PCR , Stool     Status: None   Collection Time: 01/20/21 10:16 PM   Specimen: Stool  Result Value Ref Range Status   Campylobacter species NOT DETECTED NOT DETECTED Final   Plesimonas shigelloides NOT DETECTED NOT DETECTED Final   Salmonella species NOT DETECTED NOT DETECTED Final   Yersinia enterocolitica NOT DETECTED NOT DETECTED Final    Vibrio species NOT DETECTED NOT DETECTED Final   Vibrio cholerae NOT DETECTED NOT DETECTED Final   Enteroaggregative E coli (EAEC) NOT DETECTED NOT DETECTED Final   Enteropathogenic E coli (EPEC) NOT DETECTED NOT DETECTED Final   Enterotoxigenic E coli (ETEC) NOT DETECTED NOT DETECTED Final   Shiga like toxin producing E coli (STEC) NOT DETECTED NOT DETECTED Final   Shigella/Enteroinvasive E coli (EIEC) NOT DETECTED NOT DETECTED Final   Cryptosporidium NOT DETECTED NOT DETECTED Final   Cyclospora cayetanensis NOT DETECTED NOT DETECTED Final   Entamoeba histolytica NOT DETECTED NOT DETECTED Final   Giardia lamblia NOT DETECTED NOT DETECTED Final   Adenovirus F40/41 NOT DETECTED NOT DETECTED Final   Astrovirus NOT DETECTED NOT DETECTED Final   Norovirus GI/GII NOT DETECTED NOT DETECTED Final   Rotavirus A NOT DETECTED NOT DETECTED Final   Sapovirus (I, II, IV, and V) NOT DETECTED NOT DETECTED Final    Comment: Performed at [facility], [address], [place], [place] [address]  MRSA Next Gen by PCR, Nasal  Status: None   Collection Time: 01/21/21  5:11 AM   Specimen: Nasal Swab  Result Value Ref Range Status   MRSA by PCR Next Gen NOT DETECTED NOT DETECTED Final    Comment: (NOTE) The GeneXpert MRSA Assay (FDA approved for NASAL specimens only), is one component of a comprehensive MRSA colonization surveillance program. It is not intended to diagnose MRSA infection nor to guide or monitor treatment for MRSA infections. Test performance is not FDA approved in patients less than 77 years old. Performed at [facility], [place] [address], [place], [place] [address]   Culture, blood (routine x 2)     Status: None (Preliminary result)   Collection Time: 01/21/21  8:31 AM   Specimen: BLOOD  Result Value Ref Range Status   Specimen Description BLOOD LEFT ANTECUBITAL  Final   Special Requests   Final    BOTTLES DRAWN AEROBIC AND ANAEROBIC Blood Culture adequate volume    Culture   Final    NO GROWTH 2 DAYS Performed at [facility], [place] [address] [place], [place] [address]    Report Status PENDING  Incomplete  Culture, blood (routine x 2)     Status: None (Preliminary result)   Collection Time: 01/21/21  8:32 AM   Specimen: BLOOD RIGHT WRIST  Result Value Ref Range Status   Specimen Description BLOOD RIGHT WRIST  Final   Special Requests   Final    BOTTLES DRAWN AEROBIC AND ANAEROBIC Blood Culture results may not be optimal due to an inadequate volume of blood received in culture bottles   Culture   Final    NO GROWTH 2 DAYS Performed at [facility], [place] [address] [place], [place] [address]    Report Status PENDING  Incomplete          Radiology Studies: DG CHEST PORT 1 VIEW  Result Date: 01/24/2021 CLINICAL DATA:  Dyspnea. EXAM: PORTABLE CHEST 1 VIEW COMPARISON:  01/20/2021 FINDINGS: Lungs are hypoinflated as patient is slightly rotated to the left. Subtle hazy prominence of the central pulmonary vessels likely due to the degree of hypoinflation. Minimal density over the left costophrenic angle possibly small amount of pleural fluid/atelectasis. Stable borderline cardiomegaly. Remainder the exam is unchanged. IMPRESSION: 1. Hypoinflation with possible small amount left pleural fluid/atelectasis. 2. Stable borderline cardiomegaly. Electronically Signed   By: Marin Olp M.D.   On: 01/24/2021 08:47        Scheduled Meds:  apixaban  2.5 mg Oral BID   azithromycin  500 mg Oral Daily   calcium carbonate  1 tablet Oral TID WC   feeding supplement  237 mL Oral TID BM   metoprolol succinate  50 mg Oral Daily   multivitamin with minerals  1 tablet Oral Daily   Continuous Infusions:  cefTRIAXone (ROCEPHIN)  IV 2 g (01/24/21 0954)          Aline August, MD [facility] 01/24/2021, 10:14 AM

## 2021-01-24 NOTE — Plan of Care (Signed)
  Problem: Nutrition: Goal: Adequate nutrition will be maintained Outcome: Adequate for Discharge   

## 2021-01-24 NOTE — Progress Notes (Signed)
Ok to add 5d stop date to ceftriaxone/azithromycin for PNA per Dr. Starla Link.  Onnie Boer, PharmD, BCIDP, AAHIVP, CPP Infectious Disease Pharmacist 01/24/2021 8:55 AM

## 2021-01-24 NOTE — Progress Notes (Signed)
[name]) hospitalized hospice patient visit.   This is a current [facility] hospice patient from [place] facility with a hospice diagnosis of chronic diastolic heart failure. Patient experienced a fall in the bathroom and hit his head. Facility contact family, EMS was activated and patient was admitted to [facility] on 01/20/21 with acute metabolic encephalopathy. Per Dr. Gildardo Cranker with [facility], this is a related admission.     Visited at bedside. Pt alert to self, agitated, pulling at cords.  Mitt in place on Left hand. Pt accepted water to drink; snacks available on bedside table.  Exchanged report with bedside RN and NT. Spoke with pt's son by phone to review plan to stay one more night for continued IV diuresis and abx.  Discussed option of palliative PT once back at [facility]; pt's son agreeable to this.  This patient is appropriate for inpatient GIP level of care due to need to for IV fluids and IV antibiotics.   VS: 99.1 oral, HR 49, RR 19, 121/53, SpO2 100% 2L [place] I/O: 5140/1401   Abnormal labs:  BUN 38, Creat 1.85, GFR 33, Ca 8.7, WBC 20.6, Hgb 9.5, Neut# 17.9, Abs Immat Gran 0.53  Diagnostics: none new   IV/PRN Medications: Rocephin 2g IVPB daily, lasix 40 mg PIV daily, morphine 2mg  PIV PRN pain x 1 dose, duoneb q4h PRN SOB x 3 doses,    Problem List: Acute metabolic encephalopathy Agitation/delirium -No known history of dementia but patient required restraints briefly.  Currently off restraints.  Use Haldol as needed. -Monitor mental status.  Fall precautions.   Fever Possible left basilar pneumonia Acute respiratory failure with hypoxia -Chest x-ray on presentation showed possible left lung base opacity.  - UA was negative for UTI on presentation.   -Afebrile over the last 24 hours.  Continue Rocephin and Zithromax.  Strep pneumo urinary antigen negative.  Blood cultures negative so far. -Diet as per SLP recommendations.   AKI on CKD stage  II -Creatinine 2.04 on presentation; remote baseline around 1.3.  Treated with IV fluids.  IV fluids stopped on 01/22/2021.  Patient was given 1 dose of IV Lasix because of wheezing and increased work of breathing on 01/23/2019.  Creatinine 1.89 today.  Will resume home dose of oral Lasix.   Mild leukocytosis -Still significant at 20.6 but now trending down   Discharge planning: Return to [facility] after discharge with hospice services   Family Contact: VM left for son   IDG: team updated   Goals of Care: DNR, per discussion with Dr. Starla Link will open discussion of comfort care measures after Dr. Starla Link speaks with son   If ambulance transport is needed at discharge please use [facility].   Please do not hesitate to call with questions.   Thank you for the opportunity to participate in this pt's care.  Domenic Moras, BSN, RN [facility] liaison [phone]

## 2021-01-24 NOTE — Plan of Care (Signed)
  Problem: Education: Goal: Knowledge of General Education information will improve Description: Including pain rating scale, medication(s)/side effects and non-pharmacologic comfort measures Outcome: Progressing   Problem: Safety: Goal: Non-violent Restraint(s) Outcome: Progressing   Problem: Clinical Measurements: Goal: Ability to maintain clinical measurements within normal limits will improve Outcome: Progressing Goal: Will remain free from infection Outcome: Progressing Goal: Diagnostic test results will improve Outcome: Progressing Goal: Respiratory complications will improve Outcome: Progressing Goal: Cardiovascular complication will be avoided Outcome: Progressing   Problem: Safety: Goal: Ability to remain free from injury will improve Outcome: Progressing

## 2021-01-25 LAB — CBC WITH DIFFERENTIAL/PLATELET
Abs Immature Granulocytes: 0.16 10*3/uL — ABNORMAL HIGH (ref 0.00–0.07)
Basophils Absolute: 0 10*3/uL (ref 0.0–0.1)
Basophils Relative: 0 %
Eosinophils Absolute: 0.2 10*3/uL (ref 0.0–0.5)
Eosinophils Relative: 1 %
HCT: 27 % — ABNORMAL LOW (ref 39.0–52.0)
Hemoglobin: 8.7 g/dL — ABNORMAL LOW (ref 13.0–17.0)
Immature Granulocytes: 1 %
Lymphocytes Relative: 2 %
Lymphs Abs: 0.4 10*3/uL — ABNORMAL LOW (ref 0.7–4.0)
MCH: 30.6 pg (ref 26.0–34.0)
MCHC: 32.2 g/dL (ref 30.0–36.0)
MCV: 95.1 fL (ref 80.0–100.0)
Monocytes Absolute: 1.8 10*3/uL — ABNORMAL HIGH (ref 0.1–1.0)
Monocytes Relative: 9 %
Neutro Abs: 16.3 10*3/uL — ABNORMAL HIGH (ref 1.7–7.7)
Neutrophils Relative %: 87 %
Platelets: 248 10*3/uL (ref 150–400)
RBC: 2.84 MIL/uL — ABNORMAL LOW (ref 4.22–5.81)
RDW: 15 % (ref 11.5–15.5)
WBC: 18.8 10*3/uL — ABNORMAL HIGH (ref 4.0–10.5)
nRBC: 0 % (ref 0.0–0.2)

## 2021-01-25 LAB — BASIC METABOLIC PANEL
Anion gap: 8 (ref 5–15)
BUN: 40 mg/dL — ABNORMAL HIGH (ref 8–23)
CO2: 28 mmol/L (ref 22–32)
Calcium: 8.7 mg/dL — ABNORMAL LOW (ref 8.9–10.3)
Chloride: 101 mmol/L (ref 98–111)
Creatinine, Ser: 1.72 mg/dL — ABNORMAL HIGH (ref 0.61–1.24)
GFR, Estimated: 36 mL/min — ABNORMAL LOW (ref >60)
Glucose, Bld: 143 mg/dL — ABNORMAL HIGH (ref 70–99)
Potassium: 4 mmol/L (ref 3.5–5.1)
Sodium: 137 mmol/L (ref 135–145)

## 2021-01-25 LAB — MAGNESIUM: Magnesium: 2.1 mg/dL (ref 1.7–2.4)

## 2021-01-25 LAB — GLUCOSE, CAPILLARY: Glucose-Capillary: 133 mg/dL — ABNORMAL HIGH (ref 70–99)

## 2021-01-25 MED ORDER — APIXABAN 2.5 MG PO TABS: 2.5000 mg | ORAL_TABLET | Freq: Two times a day (BID) | ORAL | 0 refills | Status: AC

## 2021-01-25 MED ORDER — FUROSEMIDE 10 MG/ML IJ SOLN
60.0000 mg | Freq: Once | INTRAMUSCULAR | Status: AC
  Administered 2021-01-25: 60 mg via INTRAVENOUS
  Filled 2021-01-25: qty 6

## 2021-01-25 MED ORDER — FUROSEMIDE 20 MG PO TABS: 40.0000 mg | ORAL_TABLET | Freq: Every day | ORAL | Status: AC

## 2021-01-25 MED ORDER — HALOPERIDOL 2 MG PO TABS: 2.0000 mg | ORAL_TABLET | Freq: Three times a day (TID) | ORAL | 0 refills | Status: AC | PRN

## 2021-01-25 MED ORDER — MOMETASONE FURO-FORMOTEROL FUM 100-5 MCG/ACT IN AERO
2.0000 | INHALATION_SPRAY | Freq: Two times a day (BID) | RESPIRATORY_TRACT | Status: DC
  Administered 2021-01-25: 2 via RESPIRATORY_TRACT
  Filled 2021-01-25: qty 8.8

## 2021-01-25 MED ORDER — CALCIUM CARBONATE ANTACID 500 MG PO CHEW: 1.0000 | CHEWABLE_TABLET | Freq: Two times a day (BID) | ORAL | 0 refills | Status: AC

## 2021-01-25 NOTE — TOC Progression Note (Addendum)
Transition of Care [name]) - Initial/Assessment Note    Patient Details  Name: Cody Mendoza MRN: [id number] Date of Birth: [birth year]  Transition of Care [name]) CM/SW Contact:    Milinda Antis, [place] Phone Number: 01/25/2021, 12:14 PM  Clinical Narrative:                 CSW received a call from [name] inquiring about the patient. CSW informed the facility that the patient is set to d/c today and will need hospice care.  CSW informed that the facility contracts with [name].    CSW contacted [name] to inform them of the need for hospice care for the [place] at [place] facility.  There was no answer.  CSW left a VM requesting a returned call.    12:19-  CSW received information that the patient was already active with Hospice.  CSW called the facility to confirm.  The admissions coordinator is verifying and will return call to [name] confirmed that Hospice is already active.  Expected Discharge Plan: [place] ([facility] ILF) Barriers to Discharge: Continued Medical Work up   Patient Goals and CMS Choice Patient states their goals for this hospitalization and ongoing recovery are:: [place] to ALF with hospice CMS Medicare.gov Compare Post Acute Care list provided to:: Patient    Expected Discharge Plan and Services Expected Discharge Plan: [place] ([facility] ILF)   Discharge Planning Services: CM Consult Post Acute Care Choice: Hospice Living arrangements for the past 2 months: [place] Expected Discharge Date: 01/25/21                                    Prior Living Arrangements/Services Living arrangements for the past 2 months: [place] Lives with:: Facility Resident Patient language and need for interpreter reviewed:: Yes Do you feel safe going back to the place where you live?: Yes      Need for Family Participation in Patient Care: Yes (Comment) Care giver  support system in place?: Yes (comment)   Criminal Activity/Legal Involvement Pertinent to Current Situation/Hospitalization: No - Comment as needed  Activities of Daily Living Home Assistive Devices/Equipment: Wheelchair ADL Screening (condition at time of admission) Patient's cognitive ability adequate to safely complete daily activities?: No Is the patient deaf or have difficulty hearing?: Yes Does the patient have difficulty seeing, even when wearing glasses/contacts?: Yes Does the patient have difficulty concentrating, remembering, or making decisions?: Yes Patient able to express need for assistance with ADLs?: No Does the patient have difficulty dressing or bathing?: Yes Independently performs ADLs?: No Does the patient have difficulty walking or climbing stairs?: Yes Weakness of Legs: Both Weakness of Arms/Hands: Both  Permission Sought/Granted Permission sought to share information with : Case Manager, Customer service manager, Family Supports Permission granted to share information with : Yes, Verbal Permission Granted              Emotional Assessment Appearance:: Appears stated age Attitude/Demeanor/Rapport: Engaged Affect (typically observed): Accepting, Appropriate, Calm, Hopeful Orientation: : Oriented to Self, Oriented to Place, Oriented to  Time, Oriented to Situation Alcohol / Substance Use: Not Applicable Psych Involvement: No (comment)  Admission diagnosis:  Hypokalemia [E87.6] Hypocalcemia [E83.51] Patient Active Problem List   Diagnosis Date Noted   Hypocalcemia 01/20/2021   Acute kidney injury superimposed on chronic kidney disease ([place]) 01/20/2021   Leukocytosis 01/20/2021  Hypertensive heart failure ([place]) 09/23/2019   Localized edema 09/23/2019   Chronic diastolic heart failure ([place]) 08/26/2019   Goals of care, counseling/discussion    DNR (do not resuscitate)    Palliative care by specialist    Hypokalemia 08/11/2019   Acute  exacerbation of congestive heart failure ([place]) 08/10/2019   Acute CHF (congestive heart failure) ([place]) 06/26/2019   Excessive cerumen in both ear canals 06/10/2019   Sensorineural hearing loss (SNHL), bilateral 06/10/2019   Gross hematuria 05/07/2019   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   Bilateral back pain 02/11/2019   Lumbar spondylosis 02/11/2019   Disorder of musculoskeletal system 01/24/2019   Pain in thoracic spine 01/24/2019   Tinea unguium 10/29/2018   Lower extremity edema 09/13/2018   Chronic osteomyelitis ([place]) 09/05/2018   Diarrhea 09/05/2018   Near syncope 08/28/2018   Hyponatremia 08/28/2018   TIA (transient ischemic attack) 08/27/2018   Discitis of thoracic region 08/23/2018   CKD (chronic kidney disease) stage 2, GFR 60-89 ml/min 08/23/2018   Chronic anemia 08/23/2018   Discitis 08/23/2018   Cough 12/18/2017   Pneumonia 12/18/2017   Acute upper respiratory infection 12/06/2017   Abnormal gait 11/10/2017   Elevated PSA 11/10/2017   Actinic keratosis 09/18/2017   Overactive bladder 06/16/2017   Lactose intolerance 01/25/2017   Asymptomatic microscopic hematuria 09/30/2016   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   Hypo-osmolality and hyponatremia 09/30/2016   Disequilibrium 11/13/2015   Abnormal results of thyroid function studies 07/03/2014   Hearing loss 05/28/2014   Puncture wound of right lower leg without foreign body 10/08/2013   Flatulence, eructation and gas pain 03/03/2013   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, [place] [address] [place] [address] Phone: [phone] Fax: [place], [place] [address] [place] [address] [place] [address] Phone: [phone] Fax: [phone]  [facility] [place], [place] AT [address] [place] [address] Phone: [phone] Fax: [phone]     Social Determinants of Health (SDOH) Interventions    Readmission Risk Interventions Readmission Risk Prevention Plan 08/12/2019  Transportation Screening Complete  Medication Review ([place]) Complete  PCP or Specialist appointment within 3-5 days of discharge Complete  HRI or [place] Complete  SW Recovery Care/Counseling Consult Complete  [place] Not Applicable  Some recent data might be hidden

## 2021-01-25 NOTE — Discharge Summary (Addendum)
Physician Discharge Summary  Cody Mendoza LOV:[id number] DOB: 1921/03/25 DOA: 01/20/2021  PCP: Crist Infante, MD  Admit date: 01/20/2021 Discharge date: 01/25/2021  Admitted From: ALF Disposition: ALF with hospice  Recommendations for Outpatient Follow-up:  Follow up with PCP in 1 week  Follow-up with hospice at earliest [place]: No Equipment/Devices: Supplemental oxygen  Discharge Condition: Poor CODE STATUS: DNR  Diet recommendation: Heart healthy/fluid restriction of up to 1200 cc a day/comfort food  Brief/Interim Summary: 87 y.o. male with medical history significant for spinal stenosis wheelchair bound, on hospice at home, paroxysmal atrial fibrillation on Eliquis, CVA, chronic diastolic heart failure, CKD stage II, and GERD presented with a fall and altered mental status.  Patient was confused on presentation and cannot provide any history.  On presentation, he was afebrile, normotensive and mildly tachycardic with WBCs of 11.7, potassium of 2.6, creatinine of 2.04 (remote baseline of around 1.3) calcium of 6 with albumin of 2.7.  CT of the head and cervical spine was negative.  Chest x-ray showed left lung base opacity with possible pneumonia.  During the hospitalization, he has completed 5-day course of antibiotic treatment.  He has required supplemental oxygen along with IV Lasix and IV Haldol intermittently for agitation.  Electrolytes have improved.  Overall prognosis is poor.  Son is agreeable for patient to return back to ALF with hospice services and agreeable on focusing on patient's comfort.  He will be discharged back to ALF today.  Discharge Diagnoses:   Fall secondary to weakness Hypocalcemia Hypokalemia: Resolved Hypomagnesemia -Presented with fall and was found to have multiple electrolyte abnormalities?  Secondary to diarrhea.   -Magnesium level normal.  Calcium improving.  Continue oral calcium supplementation upon discharge.   Acute  metabolic encephalopathy Agitation/delirium -No known history of dementia but patient required restraints briefly.  Requiring IV Haldol intermittently. -Discharged on oral Haldol as needed.  Fever Possible left basilar pneumonia Acute respiratory failure with hypoxia -Chest x-ray on presentation showed possible left lung base opacity.  - UA was negative for UTI on presentation.   -Afebrile currently.  Treated with 5 days of IV Rocephin and Zithromax.  Strep pneumo urinary antigen negative.  Blood cultures negative so far. -No need for any more antibiotics on discharge. -Currently requiring supplemental oxygen 2 to 4 L/min via nasal cannula.  Continue oxygen on discharge.  Might need higher amount of oxygen if condition continues to deteriorate.   AKI on CKD stage II -Creatinine 2.04 on presentation; remote baseline around 1.3.  Treated with IV fluids.  IV fluids stopped on 01/22/2021.  Patient was given 1 dose of IV Lasix because of wheezing and increased work of breathing on 01/23/2019.  Creatinine 1.75 today.   -Patient has received few doses of IV Lasix over the last few days.  leukocytosis -Still significant but improving at 18.8 today.     Paroxysmal A. Fib -Intermittently mildly bradycardic.  Continue Eliquis, renally dosed.  Continue metoprolol.   Chronic diastolic CHF -Diuretic plan as above.  Strict input output.  Daily weights.   Severe malnutrition--follow nutrition recommendations   Spinal stenosis, wheelchair-bound, on hospice at home -Outpatient hospice team following.  Plan is to return to assisted living facility with hospice following. -Had a detailed discussion with son today and recommended that we should focus on total comfort measures moving ahead including comfort feeding, supplemental oxygen, Haldol as needed.  It would be better if patient would stay at assisted living facility under hospice care  rather than getting hospitalized as overall prognosis is poor.   Son is agreeable.  Discharge Instructions  Discharge Instructions     Diet - low sodium heart healthy   Complete by: As directed    Increase activity slowly   Complete by: As directed    No wound care   Complete by: As directed       Anti-infectives (From admission, onward)    Start     Dose/Rate Route Frequency Ordered Stop   01/24/21 1000  cefTRIAXone (ROCEPHIN) 2 g in sodium chloride 0.9 % 100 mL IVPB        2 g 200 mL/hr over 30 Minutes Intravenous Every 24 hours 01/24/21 0852 01/25/21 1018   01/24/21 1000  azithromycin (ZITHROMAX) tablet 500 mg        500 mg Oral Daily 01/24/21 0852 01/25/21 0943   01/21/21 1130  cefTRIAXone (ROCEPHIN) 1 g in sodium chloride 0.9 % 100 mL IVPB  Status:  Discontinued        1 g 200 mL/hr over 30 Minutes Intravenous Every 24 hours 01/21/21 1036 01/24/21 0852   01/21/21 1130  azithromycin (ZITHROMAX) 500 mg in sodium chloride 0.9 % 250 mL IVPB  Status:  Discontinued        500 mg 250 mL/hr over 60 Minutes Intravenous Every 24 hours 01/21/21 1036 01/24/21 0852         Allergies  Allergen Reactions   Amlodipine     Other reaction(s): edema   Daptomycin     Other reaction(s): caused eosinophillic pna   Donepezil Hcl     Other reaction(s): insomnia, n/v   Lactose     Other reaction(s): diarrhea/gas   Thiazide-Type Diuretics     Other reaction(s): too much hyponatremia    Consultations: Outpatient hospice team following.   Procedures/Studies: CT Head Wo Contrast  Result Date: 01/20/2021 CLINICAL DATA:  Fall.  Head injury EXAM: CT HEAD WITHOUT CONTRAST CT CERVICAL SPINE WITHOUT CONTRAST TECHNIQUE: Multidetector CT imaging of the head and cervical spine was performed following the standard protocol without intravenous contrast. Multiplanar CT image reconstructions of the cervical spine were also generated. COMPARISON:  CT head 06/08/2018 FINDINGS: CT HEAD FINDINGS Brain: Negative for acute hemorrhage. Negative for acute infarct or  mass Moderate atrophy and moderate chronic microvascular ischemic change, similar to the prior study. Vascular: Negative for hyperdense vessel Skull: Negative Sinuses/Orbits: Mild mucosal edema paranasal sinuses. Bilateral cataract extraction Other: None CT CERVICAL SPINE FINDINGS Alignment: Mild anterolisthesis C5-6 Skull base and vertebrae: Negative for fracture Soft tissues and spinal canal: No acute abnormality. Atherosclerotic calcification. Disc levels: Disc degeneration and facet degeneration throughout the cervical spine. Foraminal stenosis bilaterally due to spurring. Upper chest: Lung apices clear bilaterally Other: None IMPRESSION: 1. Atrophy and chronic microvascular ischemic change. No acute intracranial abnormality. 2. Negative for cervical spine fracture.  Multilevel spondylosis. Electronically Signed   By: Franchot Gallo M.D.   On: 01/20/2021 18:50   CT Cervical Spine Wo Contrast  Result Date: 01/20/2021 CLINICAL DATA:  Fall.  Head injury EXAM: CT HEAD WITHOUT CONTRAST CT CERVICAL SPINE WITHOUT CONTRAST TECHNIQUE: Multidetector CT imaging of the head and cervical spine was performed following the standard protocol without intravenous contrast. Multiplanar CT image reconstructions of the cervical spine were also generated. COMPARISON:  CT head 06/08/2018 FINDINGS: CT HEAD FINDINGS Brain: Negative for acute hemorrhage. Negative for acute infarct or mass Moderate atrophy and moderate chronic microvascular ischemic change, similar to the prior study. Vascular: Negative for hyperdense  vessel Skull: Negative Sinuses/Orbits: Mild mucosal edema paranasal sinuses. Bilateral cataract extraction Other: None CT CERVICAL SPINE FINDINGS Alignment: Mild anterolisthesis C5-6 Skull base and vertebrae: Negative for fracture Soft tissues and spinal canal: No acute abnormality. Atherosclerotic calcification. Disc levels: Disc degeneration and facet degeneration throughout the cervical spine. Foraminal stenosis  bilaterally due to spurring. Upper chest: Lung apices clear bilaterally Other: None IMPRESSION: 1. Atrophy and chronic microvascular ischemic change. No acute intracranial abnormality. 2. Negative for cervical spine fracture.  Multilevel spondylosis. Electronically Signed   By: Franchot Gallo M.D.   On: 01/20/2021 18:50   DG CHEST PORT 1 VIEW  Result Date: 01/24/2021 CLINICAL DATA:  Dyspnea. EXAM: PORTABLE CHEST 1 VIEW COMPARISON:  01/20/2021 FINDINGS: Lungs are hypoinflated as patient is slightly rotated to the left. Subtle hazy prominence of the central pulmonary vessels likely due to the degree of hypoinflation. Minimal density over the left costophrenic angle possibly small amount of pleural fluid/atelectasis. Stable borderline cardiomegaly. Remainder the exam is unchanged. IMPRESSION: 1. Hypoinflation with possible small amount left pleural fluid/atelectasis. 2. Stable borderline cardiomegaly. Electronically Signed   By: Marin Olp M.D.   On: 01/24/2021 08:47   DG Chest Port 1 View  Result Date: 01/20/2021 CLINICAL DATA:  Fatigue.  Fall. EXAM: PORTABLE CHEST 1 VIEW COMPARISON:  Chest radiograph 08/15/2019 CT 08/23/2018 FINDINGS: Lung volumes are low. Stable cardiomegaly. Aortic atherosclerosis. Left lung base opacity is greatest in the periphery. Mild chronic interstitial coarsening. No pneumothorax. No significant pleural effusion. No acute osseous abnormalities are seen. IMPRESSION: 1. Peripheral opacity at the left lung base. This may represent pneumonia in the appropriate clinical setting. Appearance is unusual for pulmonary contusion 2. Stable cardiomegaly. Aortic Atherosclerosis (ICD10-I70.0). Electronically Signed   By: Keith Rake M.D.   On: 01/20/2021 18:14      Subjective: Patient seen and examined at bedside.  Sleepy, wakes up only very slightly, hardly answers any questions.  Required Haldol again yesterday afternoon as per nursing staff.  Discharge Exam: Vitals:    01/25/21 0454 01/25/21 0938  BP: 133/66 140/68  Pulse: 95 98  Resp: 20 18  Temp: 98.4 F (36.9 C) 98.2 F (36.8 C)  SpO2: 100% 100%    General: Pt is sleepy, wakes up slightly, hardly answers any questions.  Currently on 4 L oxygen via nasal cannula.  Looks chronically ill and deconditioned.  Elderly male lying in bed. Cardiovascular: rate controlled, S1/S2 + Respiratory: bilateral decreased breath sounds at bases with basilar crackles Abdominal: Soft, NT, ND, bowel sounds + Extremities: Mild lower extremity edema; no cyanosis    The results of significant diagnostics from this hospitalization (including imaging, microbiology, ancillary and laboratory) are listed below for reference.     Microbiology: Recent Results (from the past 240 hour(s))  Resp Panel by RT-PCR (Flu A&B, Covid) Nasopharyngeal Swab     Status: None   Collection Time: 01/20/21  9:09 PM   Specimen: Nasopharyngeal Swab; Nasopharyngeal(NP) swabs in vial transport medium  Result Value Ref Range Status   SARS Coronavirus 2 by RT PCR NEGATIVE NEGATIVE Final    Comment: (NOTE) SARS-CoV-2 target nucleic acids are NOT DETECTED.  The SARS-CoV-2 RNA is generally detectable in upper respiratory specimens during the acute phase of infection. The lowest concentration of SARS-CoV-2 viral copies this assay can detect is 138 copies/mL. A negative result does not preclude SARS-Cov-2 infection and should not be used as the sole basis for treatment or other patient management decisions. A negative result may occur with  improper specimen collection/handling, submission of specimen other than nasopharyngeal swab, presence of viral mutation(s) within the areas targeted by this assay, and inadequate number of viral copies(<138 copies/mL). A negative result must be combined with clinical observations, patient history, and epidemiological information. The expected result is Negative.  Fact Sheet for Patients:   EntrepreneurPulse.com.au  Fact Sheet for Healthcare Providers:  IncredibleEmployment.be  This test is no t yet approved or cleared by the [place] FDA and  has been authorized for detection and/or diagnosis of SARS-CoV-2 by FDA under an Emergency Use Authorization (EUA). This EUA will remain  in effect (meaning this test can be used) for the duration of the COVID-19 declaration under Section 564(b)(1) of the Act, 21 U.S.C.section 360bbb-3(b)(1), unless the authorization is terminated  or revoked sooner.       Influenza A by PCR NEGATIVE NEGATIVE Final   Influenza B by PCR NEGATIVE NEGATIVE Final    Comment: (NOTE) The Xpert Xpress SARS-CoV-2/FLU/RSV plus assay is intended as an aid in the diagnosis of influenza from Nasopharyngeal swab specimens and should not be used as a sole basis for treatment. Nasal washings and aspirates are unacceptable for Xpert Xpress SARS-CoV-2/FLU/RSV testing.  Fact Sheet for Patients: EntrepreneurPulse.com.au  Fact Sheet for Healthcare Providers: IncredibleEmployment.be  This test is not yet approved or cleared by the [place] FDA and has been authorized for detection and/or diagnosis of SARS-CoV-2 by FDA under an Emergency Use Authorization (EUA). This EUA will remain in effect (meaning this test can be used) for the duration of the COVID-19 declaration under Section 564(b)(1) of the Act, 21 U.S.C. section 360bbb-3(b)(1), unless the authorization is terminated or revoked.  Performed at [facility], [place] [address] [place], [place] [address]   Gastrointestinal Panel by PCR , Stool     Status: None   Collection Time: 01/20/21 10:16 PM   Specimen: Stool  Result Value Ref Range Status   Campylobacter species NOT DETECTED NOT DETECTED Final   Plesimonas shigelloides NOT DETECTED NOT DETECTED Final   Salmonella species NOT DETECTED NOT DETECTED Final    Yersinia enterocolitica NOT DETECTED NOT DETECTED Final   Vibrio species NOT DETECTED NOT DETECTED Final   Vibrio cholerae NOT DETECTED NOT DETECTED Final   Enteroaggregative E coli (EAEC) NOT DETECTED NOT DETECTED Final   Enteropathogenic E coli (EPEC) NOT DETECTED NOT DETECTED Final   Enterotoxigenic E coli (ETEC) NOT DETECTED NOT DETECTED Final   Shiga like toxin producing E coli (STEC) NOT DETECTED NOT DETECTED Final   Shigella/Enteroinvasive E coli (EIEC) NOT DETECTED NOT DETECTED Final   Cryptosporidium NOT DETECTED NOT DETECTED Final   Cyclospora cayetanensis NOT DETECTED NOT DETECTED Final   Entamoeba histolytica NOT DETECTED NOT DETECTED Final   Giardia lamblia NOT DETECTED NOT DETECTED Final   Adenovirus F40/41 NOT DETECTED NOT DETECTED Final   Astrovirus NOT DETECTED NOT DETECTED Final   Norovirus GI/GII NOT DETECTED NOT DETECTED Final   Rotavirus A NOT DETECTED NOT DETECTED Final   Sapovirus (I, II, IV, and V) NOT DETECTED NOT DETECTED Final    Comment: Performed at [facility], [place] [place], [place] [address]  MRSA Next Gen by PCR, Nasal     Status: None   Collection Time: 01/21/21  5:11 AM   Specimen: Nasal Swab  Result Value Ref Range Status   MRSA by PCR Next Gen NOT DETECTED NOT DETECTED Final    Comment: (NOTE) The GeneXpert MRSA Assay (FDA approved for NASAL specimens  only), is one component of a comprehensive MRSA colonization surveillance program. It is not intended to diagnose MRSA infection nor to guide or monitor treatment for MRSA infections. Test performance is not FDA approved in patients less than 49 years old. Performed at [facility], [place] [address] [place], [place] [address]   Culture, blood (routine x 2)     Status: None (Preliminary result)   Collection Time: 01/21/21  8:31 AM   Specimen: BLOOD  Result Value Ref Range Status   Specimen Description BLOOD LEFT ANTECUBITAL  Final   Special Requests   Final    BOTTLES  DRAWN AEROBIC AND ANAEROBIC Blood Culture adequate volume   Culture   Final    NO GROWTH 3 DAYS Performed at [facility], [place] [address], [place], [place] [address]    Report Status PENDING  Incomplete  Culture, blood (routine x 2)     Status: None (Preliminary result)   Collection Time: 01/21/21  8:32 AM   Specimen: BLOOD RIGHT WRIST  Result Value Ref Range Status   Specimen Description BLOOD RIGHT WRIST  Final   Special Requests   Final    BOTTLES DRAWN AEROBIC AND ANAEROBIC Blood Culture results may not be optimal due to an inadequate volume of blood received in culture bottles   Culture   Final    NO GROWTH 3 DAYS Performed at [facility], [place] [address], [place], [place] [address]    Report Status PENDING  Incomplete     Labs: BNP (last 3 results) No results for input(s): BNP in the last 8760 hours. Basic Metabolic Panel: Recent Labs  Lab 01/21/21 0351 01/22/21 0347 01/23/21 0326 01/24/21 0430 01/25/21 0209  NA 139 140 137 140 137  K 3.5 4.4 4.5 4.3 4.0  CL 100 105 102 105 101  CO2 26 26 27 28 28   GLUCOSE 118* 124* 128* 127* 143*  BUN 28* 28* 36* 38* 40*  CREATININE 1.80* 1.77* 1.89* 1.85* 1.72*  CALCIUM 6.1* 6.6* 7.6* 8.7* 8.7*  MG 1.1* 1.4* 2.3 2.2 2.1   Liver Function Tests: Recent Labs  Lab 01/20/21 1804 01/22/21 0347  AST 37 36  ALT 23 25  ALKPHOS 109 97  BILITOT 2.0* 1.9*  PROT 6.5 5.8*  ALBUMIN 2.7* 2.2*   No results for input(s): LIPASE, AMYLASE in the last 168 hours. No results for input(s): AMMONIA in the last 168 hours. CBC: Recent Labs  Lab 01/20/21 1804 01/22/21 0347 01/23/21 0326 01/24/21 0430 01/25/21 0209  WBC 11.7* 24.6* 24.4* 20.6* 18.8*  NEUTROABS 8.7* 21.9* 21.5* 17.9* 16.3*  HGB 10.0* 9.7* 8.9* 9.5* 8.7*  HCT 30.8* 29.5* 27.5* 29.2* 27.0*  MCV 94.2 94.6 95.5 95.4 95.1  PLT 256 225 237 258 248   Cardiac Enzymes: No results for input(s): CKTOTAL, CKMB, CKMBINDEX, TROPONINI in the last 168  hours. BNP: Invalid input(s): POCBNP CBG: Recent Labs  Lab 01/24/21 1121 01/24/21 1635  GLUCAP 147* 126*   D-Dimer No results for input(s): DDIMER in the last 72 hours. Hgb A1c No results for input(s): HGBA1C in the last 72 hours. Lipid Profile No results for input(s): CHOL, HDL, LDLCALC, TRIG, CHOLHDL, LDLDIRECT in the last 72 hours. Thyroid function studies No results for input(s): TSH, T4TOTAL, T3FREE, THYROIDAB in the last 72 hours.  Invalid input(s): FREET3 Anemia work up No results for input(s): VITAMINB12, FOLATE, FERRITIN, TIBC, IRON, RETICCTPCT in the last 72 hours. Urinalysis    Component Value Date/Time  COLORURINE YELLOW 01/20/2021 1958   APPEARANCEUR CLEAR 01/20/2021 1958   LABSPEC 1.012 01/20/2021 1958   PHURINE 5.0 01/20/2021 1958   GLUCOSEU NEGATIVE 01/20/2021 1958   HGBUR NEGATIVE 01/20/2021 1958   BILIRUBINUR NEGATIVE 01/20/2021 [place] NEGATIVE 01/20/2021 1958   PROTEINUR NEGATIVE 01/20/2021 1958   UROBILINOGEN 1.0 09/21/2014 1610   NITRITE NEGATIVE 01/20/2021 1958   LEUKOCYTESUR NEGATIVE 01/20/2021 1958   Sepsis Labs Invalid input(s): PROCALCITONIN,  WBC,  LACTICIDVEN Microbiology Recent Results (from the past 240 hour(s))  Resp Panel by RT-PCR (Flu A&B, Covid) Nasopharyngeal Swab     Status: None   Collection Time: 01/20/21  9:09 PM   Specimen: Nasopharyngeal Swab; Nasopharyngeal(NP) swabs in vial transport medium  Result Value Ref Range Status   SARS Coronavirus 2 by RT PCR NEGATIVE NEGATIVE Final    Comment: (NOTE) SARS-CoV-2 target nucleic acids are NOT DETECTED.  The SARS-CoV-2 RNA is generally detectable in upper respiratory specimens during the acute phase of infection. The lowest concentration of SARS-CoV-2 viral copies this assay can detect is 138 copies/mL. A negative result does not preclude SARS-Cov-2 infection and should not be used as the sole basis for treatment or other patient management decisions. A negative result  may occur with  improper specimen collection/handling, submission of specimen other than nasopharyngeal swab, presence of viral mutation(s) within the areas targeted by this assay, and inadequate number of viral copies(<138 copies/mL). A negative result must be combined with clinical observations, patient history, and epidemiological information. The expected result is Negative.  Fact Sheet for Patients:  EntrepreneurPulse.com.au  Fact Sheet for Healthcare Providers:  IncredibleEmployment.be  This test is no t yet approved or cleared by the [place] FDA and  has been authorized for detection and/or diagnosis of SARS-CoV-2 by FDA under an Emergency Use Authorization (EUA). This EUA will remain  in effect (meaning this test can be used) for the duration of the COVID-19 declaration under Section 564(b)(1) of the Act, 21 U.S.C.section 360bbb-3(b)(1), unless the authorization is terminated  or revoked sooner.       Influenza A by PCR NEGATIVE NEGATIVE Final   Influenza B by PCR NEGATIVE NEGATIVE Final    Comment: (NOTE) The Xpert Xpress SARS-CoV-2/FLU/RSV plus assay is intended as an aid in the diagnosis of influenza from Nasopharyngeal swab specimens and should not be used as a sole basis for treatment. Nasal washings and aspirates are unacceptable for Xpert Xpress SARS-CoV-2/FLU/RSV testing.  Fact Sheet for Patients: EntrepreneurPulse.com.au  Fact Sheet for Healthcare Providers: IncredibleEmployment.be  This test is not yet approved or cleared by the [place] FDA and has been authorized for detection and/or diagnosis of SARS-CoV-2 by FDA under an Emergency Use Authorization (EUA). This EUA will remain in effect (meaning this test can be used) for the duration of the COVID-19 declaration under Section 564(b)(1) of the Act, 21 U.S.C. section 360bbb-3(b)(1), unless the authorization is terminated  or revoked.  Performed at [facility], [place] [address] [place], [place] [address]   Gastrointestinal Panel by PCR , Stool     Status: None   Collection Time: 01/20/21 10:16 PM   Specimen: Stool  Result Value Ref Range Status   Campylobacter species NOT DETECTED NOT DETECTED Final   Plesimonas shigelloides NOT DETECTED NOT DETECTED Final   Salmonella species NOT DETECTED NOT DETECTED Final   Yersinia enterocolitica NOT DETECTED NOT DETECTED Final   Vibrio species NOT DETECTED NOT DETECTED Final   Vibrio cholerae NOT DETECTED NOT DETECTED Final  Enteroaggregative E coli (EAEC) NOT DETECTED NOT DETECTED Final   Enteropathogenic E coli (EPEC) NOT DETECTED NOT DETECTED Final   Enterotoxigenic E coli (ETEC) NOT DETECTED NOT DETECTED Final   Shiga like toxin producing E coli (STEC) NOT DETECTED NOT DETECTED Final   Shigella/Enteroinvasive E coli (EIEC) NOT DETECTED NOT DETECTED Final   Cryptosporidium NOT DETECTED NOT DETECTED Final   Cyclospora cayetanensis NOT DETECTED NOT DETECTED Final   Entamoeba histolytica NOT DETECTED NOT DETECTED Final   Giardia lamblia NOT DETECTED NOT DETECTED Final   Adenovirus F40/41 NOT DETECTED NOT DETECTED Final   Astrovirus NOT DETECTED NOT DETECTED Final   Norovirus GI/GII NOT DETECTED NOT DETECTED Final   Rotavirus A NOT DETECTED NOT DETECTED Final   Sapovirus (I, II, IV, and V) NOT DETECTED NOT DETECTED Final    Comment: Performed at [facility], [place] [address] [place], [place] [address]  MRSA Next Gen by PCR, Nasal     Status: None   Collection Time: 01/21/21  5:11 AM   Specimen: Nasal Swab  Result Value Ref Range Status   MRSA by PCR Next Gen NOT DETECTED NOT DETECTED Final    Comment: (NOTE) The GeneXpert MRSA Assay (FDA approved for NASAL specimens only), is one component of a comprehensive MRSA colonization surveillance program. It is not intended to diagnose MRSA infection nor to guide or monitor treatment for MRSA  infections. Test performance is not FDA approved in patients less than 32 years old. Performed at [facility], [place] [address] [place], [place] [address]   Culture, blood (routine x 2)     Status: None (Preliminary result)   Collection Time: 01/21/21  8:31 AM   Specimen: BLOOD  Result Value Ref Range Status   Specimen Description BLOOD LEFT ANTECUBITAL  Final   Special Requests   Final    BOTTLES DRAWN AEROBIC AND ANAEROBIC Blood Culture adequate volume   Culture   Final    NO GROWTH 3 DAYS Performed at [facility], [place] [address], [place], [place] [address]    Report Status PENDING  Incomplete  Culture, blood (routine x 2)     Status: None (Preliminary result)   Collection Time: 01/21/21  8:32 AM   Specimen: BLOOD RIGHT WRIST  Result Value Ref Range Status   Specimen Description BLOOD RIGHT WRIST  Final   Special Requests   Final    BOTTLES DRAWN AEROBIC AND ANAEROBIC Blood Culture results may not be optimal due to an inadequate volume of blood received in culture bottles   Culture   Final    NO GROWTH 3 DAYS Performed at [facility], [place] [address], [place], [place] [address]    Report Status PENDING  Incomplete     Time coordinating discharge: 35 minutes  SIGNED:   Aline August, MD  [facility] 01/25/2021, 10:26 AM

## 2021-01-25 NOTE — Plan of Care (Signed)
  Problem: Safety: Goal: Non-violent Restraint(s) Outcome: Progressing   Problem: Nutrition: Goal: Adequate nutrition will be maintained Outcome: Progressing   Problem: Skin Integrity: Goal: Risk for impaired skin integrity will decrease Outcome: Progressing

## 2021-01-25 NOTE — Evaluation (Signed)
Physical Therapy Evaluation Patient Details Name: Cody Mendoza MRN: [id number] DOB: 10-25-1921 Today's Date: 01/25/2021  History of Present Illness  Pt is a 87 y.o. M who presents after a fall and AMS. Chest x-ray showed left lung base opacity with possible PNA. Pt receiving hospice services at [place]. Significant PMH: paroxysmal atrial fibrillation, CVA, chronic diastolic heart failure, CKD stage II.  Clinical Impression  PTA, pt lives at [facility] and is "wheelchair bound," per chart review. Pt requiring maximal assist for bed mobility. SpO2 94% on 4L O2, HR 105 bpm. Worked on static/dynamic sitting balance with self feeding task. After several bites of apple sauce and sips of water, noted desaturation to 76%. Returned to supine with HOB elevated, where he rebounded to 99%. Pt likely at his functional baseline; recommend return to [facility] at discharge. No further acute PT needs. Thank you for this consult.      Recommendations for follow up therapy are one component of a multi-disciplinary discharge planning process, led by the attending physician.  Recommendations may be updated based on patient status, additional functional criteria and insurance authorization.  Follow Up Recommendations Long-term institutional care without follow-up therapy    Assistance Recommended at Discharge Frequent or constant Supervision/Assistance  Functional Status Assessment Patient has had a recent decline in their functional status and/or demonstrates limited ability to make significant improvements in function in a reasonable and predictable amount of time  Equipment Recommendations  None recommended by PT    Recommendations for Other Services       Precautions / Restrictions Precautions Precautions: Fall;Other (comment) Precaution Comments: watch O2 Restrictions Weight Bearing Restrictions: No      Mobility  Bed Mobility Overal bed mobility: Needs Assistance Bed Mobility: Supine  to Sit;Sit to Supine     Supine to sit: Max assist Sit to supine: Max assist   General bed mobility comments: Decreased initiation by pt, maxA for supine <> sit    Transfers                   General transfer comment: deferred    Ambulation/Gait                  Stairs            Wheelchair Mobility    Modified Rankin (Stroke Patients Only)       Balance Overall balance assessment: Needs assistance Sitting-balance support: Feet supported Sitting balance-Leahy Scale: Poor Sitting balance - Comments: Requiring minA with static balance, modA with dynamic balance                                     Pertinent Vitals/Pain Pain Assessment: Faces Faces Pain Scale: No hurt    Home Living Family/patient expects to be discharged to:: Assisted living                   Additional Comments: [facility]    Prior Function Prior Level of Function : Needs assist             Mobility Comments: "w/c bound" per chart review ADLs Comments: able to self feed     Hand Dominance        Extremity/Trunk Assessment   Upper Extremity Assessment Upper Extremity Assessment: Generalized weakness    Lower Extremity Assessment Lower Extremity Assessment: Generalized weakness    Cervical / Trunk Assessment Cervical / Trunk Assessment: Kyphotic  Communication  Communication: No difficulties  Cognition Arousal/Alertness: Awake/alert Behavior During Therapy: Flat affect Overall Cognitive Status: History of cognitive impairments - at baseline                                 General Comments: Pt smiling at therapist, follows 1 step commands inconsistently, limited verbalizations        General Comments      Exercises     Assessment/Plan    PT Assessment Patient does not need any further PT services  PT Problem List         PT Treatment Interventions      PT Goals (Current goals can be found in the Care  Plan section)  Acute Rehab PT Goals Patient Stated Goal: did not state PT Goal Formulation: All assessment and education complete, DC therapy    Frequency     Barriers to discharge        Co-evaluation               AM-PAC PT "6 Clicks" Mobility  Outcome Measure Help needed turning from your back to your side while in a flat bed without using bedrails?: A Lot Help needed moving from lying on your back to sitting on the side of a flat bed without using bedrails?: A Lot Help needed moving to and from a bed to a chair (including a wheelchair)?: Total Help needed standing up from a chair using your arms (e.g., wheelchair or bedside chair)?: Total Help needed to walk in hospital room?: Total Help needed climbing 3-5 steps with a railing? : Total 6 Click Score: 8    End of Session Equipment Utilized During Treatment: Oxygen Activity Tolerance: Patient limited by fatigue Patient left: in bed;with call bell/phone within reach;with bed alarm set;with restraints reapplied Nurse Communication: Mobility status PT Visit Diagnosis: Muscle weakness (generalized) (M62.81)    Time: 8270-7867 PT Time Calculation (min) (ACUTE ONLY): 24 min   Charges:   PT Evaluation $PT Eval Moderate Complexity: 1 Mod PT Treatments $Therapeutic Activity: 8-22 mins        Wyona Almas, PT, DPT Acute Rehabilitation Services Pager [phone] Office [phone]   Deno Etienne 01/25/2021, 11:37 AM

## 2021-01-25 NOTE — TOC Transition Note (Signed)
Transition of Care [facility]) - CM/SW Discharge Note   Patient Details  Name: Cody Mendoza MRN: [id number] Date of Birth: 1921/05/10  [facility] CM/SW Contact:  Milinda Antis, [place] Phone Number: 01/25/2021, 1:09 PM   Clinical Narrative:     Patient will DC to: [facility] Anticipated DC date: 01/25/2021 Family notified: Yes Transport by: Charisse Klinefelter   Per MD patient ready for DC to ALF.  RN, patient, patient's family, and facility notified of DC. Discharge Summary and FL2 sent to facility. DC packet on chart. Ambulance transport requested for patient.   CSW will sign off for now as social work intervention is no longer needed. Please consult [place] again if new needs arise.    Final next level of care: [place] ([facility]) Barriers to Discharge: Continued Medical Work up   Patient Goals and CMS Choice Patient states their goals for this hospitalization and ongoing recovery are:: [place] to ALF with hospice CMS Medicare.gov Compare Post Acute Care list provided to:: Patient    Discharge Placement                       Discharge Plan and Services   Discharge Planning Services: CM Consult Post Acute Care Choice: Hospice                               Social Determinants of Health (SDOH) Interventions     Readmission Risk Interventions Readmission Risk Prevention Plan 08/12/2019  Transportation Screening Complete  Medication Review Press photographer) Complete  PCP or Specialist appointment within 3-5 days of discharge Complete  HRI or [place] Complete  SW Recovery Care/Counseling Consult Complete  [place] Not Applicable  Some recent data might be hidden

## 2021-01-26 LAB — CULTURE, BLOOD (ROUTINE X 2)
Culture: NO GROWTH
Culture: NO GROWTH
Special Requests: ADEQUATE

## 2021-02-03 ENCOUNTER — Ambulatory Visit: Payer: Medicare Other | Admitting: Podiatry

## 2021-03-07 DEATH — deceased
